# Patient Record
Sex: Male | Born: 1965
Health system: Southern US, Community
[De-identification: ages and names within clinical notes are randomized; demographics above are authoritative.]

## PROBLEM LIST (undated history)

## (undated) DIAGNOSIS — I10 Essential (primary) hypertension: Secondary | ICD-10-CM

## (undated) DIAGNOSIS — E215 Disorder of parathyroid gland, unspecified: Secondary | ICD-10-CM

## (undated) DIAGNOSIS — Z992 Dependence on renal dialysis: Secondary | ICD-10-CM

## (undated) DIAGNOSIS — I770 Arteriovenous fistula, acquired: Secondary | ICD-10-CM

## (undated) DIAGNOSIS — E079 Disorder of thyroid, unspecified: Secondary | ICD-10-CM

## (undated) DIAGNOSIS — D649 Anemia, unspecified: Secondary | ICD-10-CM

## (undated) DIAGNOSIS — N186 End stage renal disease: Secondary | ICD-10-CM

## (undated) DIAGNOSIS — I509 Heart failure, unspecified: Secondary | ICD-10-CM

## (undated) DIAGNOSIS — I517 Cardiomegaly: Secondary | ICD-10-CM

## (undated) DIAGNOSIS — C801 Malignant (primary) neoplasm, unspecified: Secondary | ICD-10-CM

## (undated) DIAGNOSIS — N189 Chronic kidney disease, unspecified: Secondary | ICD-10-CM

## (undated) HISTORY — DX: Chronic kidney disease, unspecified: N18.9

## (undated) HISTORY — PX: OTHER SURGICAL HISTORY: SHX169

---

## 2008-12-22 ENCOUNTER — Emergency Department (HOSPITAL_COMMUNITY): Admission: EM | Admit: 2008-12-22 | Discharge: 2008-12-22 | Payer: Self-pay | Admitting: Emergency Medicine

## 2013-04-12 ENCOUNTER — Encounter (HOSPITAL_COMMUNITY): Payer: Self-pay

## 2013-04-12 ENCOUNTER — Emergency Department (HOSPITAL_COMMUNITY): Payer: Self-pay

## 2013-04-12 ENCOUNTER — Inpatient Hospital Stay (HOSPITAL_COMMUNITY)
Admission: EM | Admit: 2013-04-12 | Discharge: 2013-04-13 | DRG: 305 | Discharge status: Against Medical Advice | Payer: Self-pay | Attending: Internal Medicine | Admitting: Internal Medicine

## 2013-04-12 DIAGNOSIS — R609 Edema, unspecified: Secondary | ICD-10-CM | POA: Diagnosis present

## 2013-04-12 DIAGNOSIS — Z91199 Patient's noncompliance with other medical treatment and regimen due to unspecified reason: Secondary | ICD-10-CM

## 2013-04-12 DIAGNOSIS — I5043 Acute on chronic combined systolic (congestive) and diastolic (congestive) heart failure: Secondary | ICD-10-CM | POA: Diagnosis present

## 2013-04-12 DIAGNOSIS — I161 Hypertensive emergency: Secondary | ICD-10-CM | POA: Diagnosis present

## 2013-04-12 DIAGNOSIS — I509 Heart failure, unspecified: Secondary | ICD-10-CM | POA: Diagnosis present

## 2013-04-12 DIAGNOSIS — D649 Anemia, unspecified: Secondary | ICD-10-CM

## 2013-04-12 DIAGNOSIS — Z9119 Patient's noncompliance with other medical treatment and regimen: Secondary | ICD-10-CM

## 2013-04-12 DIAGNOSIS — I5031 Acute diastolic (congestive) heart failure: Secondary | ICD-10-CM

## 2013-04-12 DIAGNOSIS — N179 Acute kidney failure, unspecified: Secondary | ICD-10-CM | POA: Diagnosis present

## 2013-04-12 DIAGNOSIS — I10 Essential (primary) hypertension: Principal | ICD-10-CM | POA: Diagnosis present

## 2013-04-12 DIAGNOSIS — Z6841 Body Mass Index (BMI) 40.0 and over, adult: Secondary | ICD-10-CM

## 2013-04-12 DIAGNOSIS — R0601 Orthopnea: Secondary | ICD-10-CM | POA: Diagnosis present

## 2013-04-12 DIAGNOSIS — R600 Localized edema: Secondary | ICD-10-CM | POA: Diagnosis present

## 2013-04-12 DIAGNOSIS — I517 Cardiomegaly: Secondary | ICD-10-CM | POA: Diagnosis present

## 2013-04-12 DIAGNOSIS — M7989 Other specified soft tissue disorders: Secondary | ICD-10-CM | POA: Diagnosis present

## 2013-04-12 DIAGNOSIS — R748 Abnormal levels of other serum enzymes: Secondary | ICD-10-CM | POA: Diagnosis present

## 2013-04-12 DIAGNOSIS — N19 Unspecified kidney failure: Secondary | ICD-10-CM

## 2013-04-12 HISTORY — DX: Morbid (severe) obesity due to excess calories: E66.01

## 2013-04-12 HISTORY — DX: Cardiomegaly: I51.7

## 2013-04-12 HISTORY — DX: Essential (primary) hypertension: I10

## 2013-04-12 LAB — CBC
MCH: 29.3 pg (ref 26.0–34.0)
MCHC: 33.8 g/dL (ref 30.0–36.0)
MCV: 86.7 fL (ref 78.0–100.0)
Platelets: 156 10*3/uL (ref 150–400)
RDW: 14.7 % (ref 11.5–15.5)
WBC: 5.8 10*3/uL (ref 4.0–10.5)

## 2013-04-12 LAB — BASIC METABOLIC PANEL
BUN: 50 mg/dL — ABNORMAL HIGH (ref 6–23)
Calcium: 8.3 mg/dL — ABNORMAL LOW (ref 8.4–10.5)
Chloride: 103 mEq/L (ref 96–112)
Creatinine, Ser: 4.29 mg/dL — ABNORMAL HIGH (ref 0.50–1.35)
GFR calc Af Amer: 17 mL/min — ABNORMAL LOW (ref >90)
GFR calc non Af Amer: 15 mL/min — ABNORMAL LOW (ref >90)

## 2013-04-12 MED ORDER — NITROGLYCERIN 0.4 MG SL SUBL
0.4000 mg | SUBLINGUAL_TABLET | Freq: Once | SUBLINGUAL | Status: AC
  Administered 2013-04-12: 0.4 mg via SUBLINGUAL
  Filled 2013-04-12: qty 25

## 2013-04-12 MED ORDER — LABETALOL HCL 5 MG/ML IV SOLN
10.0000 mg | Freq: Once | INTRAVENOUS | Status: AC
  Administered 2013-04-12: 10 mg via INTRAVENOUS
  Filled 2013-04-12: qty 4

## 2013-04-12 NOTE — ED Notes (Signed)
Pt complains of high blood pressure, bilateral lower leg swelling. He had an abnormal EKG last week at Urgent Care

## 2013-04-13 ENCOUNTER — Inpatient Hospital Stay (HOSPITAL_COMMUNITY): Payer: Self-pay

## 2013-04-13 ENCOUNTER — Encounter (HOSPITAL_COMMUNITY): Payer: Self-pay

## 2013-04-13 DIAGNOSIS — I517 Cardiomegaly: Secondary | ICD-10-CM | POA: Diagnosis present

## 2013-04-13 DIAGNOSIS — R600 Localized edema: Secondary | ICD-10-CM | POA: Diagnosis present

## 2013-04-13 DIAGNOSIS — N179 Acute kidney failure, unspecified: Secondary | ICD-10-CM

## 2013-04-13 DIAGNOSIS — D649 Anemia, unspecified: Secondary | ICD-10-CM

## 2013-04-13 DIAGNOSIS — I161 Hypertensive emergency: Secondary | ICD-10-CM | POA: Diagnosis present

## 2013-04-13 DIAGNOSIS — I10 Essential (primary) hypertension: Principal | ICD-10-CM

## 2013-04-13 DIAGNOSIS — I059 Rheumatic mitral valve disease, unspecified: Secondary | ICD-10-CM

## 2013-04-13 DIAGNOSIS — I5031 Acute diastolic (congestive) heart failure: Secondary | ICD-10-CM

## 2013-04-13 DIAGNOSIS — R748 Abnormal levels of other serum enzymes: Secondary | ICD-10-CM

## 2013-04-13 DIAGNOSIS — I5043 Acute on chronic combined systolic (congestive) and diastolic (congestive) heart failure: Secondary | ICD-10-CM | POA: Diagnosis present

## 2013-04-13 DIAGNOSIS — I509 Heart failure, unspecified: Secondary | ICD-10-CM

## 2013-04-13 HISTORY — DX: Acute on chronic combined systolic (congestive) and diastolic (congestive) heart failure: I50.43

## 2013-04-13 LAB — MRSA PCR SCREENING: MRSA by PCR: NEGATIVE

## 2013-04-13 LAB — TROPONIN I: Troponin I: 0.38 ng/mL (ref <0.30)

## 2013-04-13 LAB — CBC
Hemoglobin: 11.8 g/dL — ABNORMAL LOW (ref 13.0–17.0)
MCHC: 33.1 g/dL (ref 30.0–36.0)
RBC: 4.11 MIL/uL — ABNORMAL LOW (ref 4.22–5.81)

## 2013-04-13 LAB — COMPREHENSIVE METABOLIC PANEL
BUN: 51 mg/dL — ABNORMAL HIGH (ref 6–23)
CO2: 28 mEq/L (ref 19–32)
Calcium: 8.2 mg/dL — ABNORMAL LOW (ref 8.4–10.5)
Creatinine, Ser: 4.28 mg/dL — ABNORMAL HIGH (ref 0.50–1.35)
GFR calc Af Amer: 17 mL/min — ABNORMAL LOW (ref >90)
GFR calc non Af Amer: 15 mL/min — ABNORMAL LOW (ref >90)
Glucose, Bld: 128 mg/dL — ABNORMAL HIGH (ref 70–99)

## 2013-04-13 LAB — URINALYSIS, ROUTINE W REFLEX MICROSCOPIC
Ketones, ur: NEGATIVE mg/dL
Leukocytes, UA: NEGATIVE
Nitrite: NEGATIVE
Protein, ur: 30 mg/dL — AB
Urobilinogen, UA: 0.2 mg/dL (ref 0.0–1.0)
pH: 5 (ref 5.0–8.0)

## 2013-04-13 LAB — PROTIME-INR
INR: 1.25 (ref 0.00–1.49)
Prothrombin Time: 15.4 seconds — ABNORMAL HIGH (ref 11.6–15.2)

## 2013-04-13 MED ORDER — FUROSEMIDE 10 MG/ML IJ SOLN
40.0000 mg | Freq: Two times a day (BID) | INTRAMUSCULAR | Status: DC
  Administered 2013-04-13: 40 mg via INTRAVENOUS

## 2013-04-13 MED ORDER — ACETAMINOPHEN 650 MG RE SUPP: 650.0000 mg | Freq: Four times a day (QID) | RECTAL | Status: DC | PRN

## 2013-04-13 MED ORDER — ALBUTEROL SULFATE HFA 108 (90 BASE) MCG/ACT IN AERS: 2.0000 | INHALATION_SPRAY | Freq: Four times a day (QID) | RESPIRATORY_TRACT | Status: DC | PRN

## 2013-04-13 MED ORDER — HEPARIN BOLUS VIA INFUSION
4000.0000 [IU] | Freq: Once | INTRAVENOUS | Status: AC
  Administered 2013-04-13: 4000 [IU] via INTRAVENOUS
  Filled 2013-04-13: qty 4000

## 2013-04-13 MED ORDER — SODIUM CHLORIDE 0.9 % IJ SOLN: 3.0000 mL | Freq: Two times a day (BID) | INTRAMUSCULAR | Status: DC

## 2013-04-13 MED ORDER — SODIUM CHLORIDE 0.9 % IV SOLN: 250.0000 mL | INTRAVENOUS | Status: DC | PRN

## 2013-04-13 MED ORDER — LORAZEPAM 2 MG/ML IJ SOLN
0.5000 mg | Freq: Two times a day (BID) | INTRAMUSCULAR | Status: DC | PRN
  Filled 2013-04-13: qty 1

## 2013-04-13 MED ORDER — FUROSEMIDE 10 MG/ML IJ SOLN
40.0000 mg | Freq: Two times a day (BID) | INTRAMUSCULAR | Status: DC
  Filled 2013-04-13: qty 4

## 2013-04-13 MED ORDER — HEPARIN (PORCINE) IN NACL 100-0.45 UNIT/ML-% IJ SOLN
1500.0000 [IU]/h | INTRAMUSCULAR | Status: DC
  Administered 2013-04-13: 1500 [IU]/h via INTRAVENOUS
  Filled 2013-04-13 (×2): qty 250

## 2013-04-13 MED ORDER — ASPIRIN EC 81 MG PO TBEC
81.0000 mg | DELAYED_RELEASE_TABLET | Freq: Every day | ORAL | Status: DC
  Administered 2013-04-13: 81 mg via ORAL
  Filled 2013-04-13: qty 1

## 2013-04-13 MED ORDER — NITROGLYCERIN IN D5W 200-5 MCG/ML-% IV SOLN
2.0000 ug/min | INTRAVENOUS | Status: DC
  Administered 2013-04-13: 20 ug/min via INTRAVENOUS
  Administered 2013-04-13: 170 ug/min via INTRAVENOUS
  Filled 2013-04-13 (×3): qty 250

## 2013-04-13 MED ORDER — ASPIRIN 325 MG PO TABS
325.0000 mg | ORAL_TABLET | Freq: Once | ORAL | Status: AC
  Administered 2013-04-13: 325 mg via ORAL
  Filled 2013-04-13: qty 1

## 2013-04-13 MED ORDER — SODIUM CHLORIDE 0.9 % IJ SOLN: 3.0000 mL | INTRAMUSCULAR | Status: DC | PRN

## 2013-04-13 MED ORDER — MORPHINE SULFATE 2 MG/ML IJ SOLN
1.0000 mg | INTRAMUSCULAR | Status: DC | PRN
  Administered 2013-04-13: 1 mg via INTRAVENOUS
  Filled 2013-04-13: qty 1

## 2013-04-13 MED ORDER — FUROSEMIDE 10 MG/ML IJ SOLN
60.0000 mg | Freq: Once | INTRAMUSCULAR | Status: AC
  Administered 2013-04-13: 60 mg via INTRAVENOUS

## 2013-04-13 MED ORDER — NICARDIPINE HCL IN NACL 20-0.86 MG/200ML-% IV SOLN
5.0000 mg/h | Freq: Once | INTRAVENOUS | Status: AC
  Administered 2013-04-13: 5 mg/h via INTRAVENOUS
  Filled 2013-04-13: qty 200

## 2013-04-13 MED ORDER — HEPARIN SODIUM (PORCINE) 5000 UNIT/ML IJ SOLN
5000.0000 [IU] | Freq: Three times a day (TID) | INTRAMUSCULAR | Status: DC
  Administered 2013-04-13: 5000 [IU] via SUBCUTANEOUS
  Filled 2013-04-13 (×4): qty 1

## 2013-04-13 MED ORDER — METOPROLOL TARTRATE 50 MG PO TABS
50.0000 mg | ORAL_TABLET | Freq: Two times a day (BID) | ORAL | Status: DC
  Administered 2013-04-13: 50 mg via ORAL
  Filled 2013-04-13 (×2): qty 1

## 2013-04-13 MED ORDER — LABETALOL HCL 5 MG/ML IV SOLN
5.0000 mg | Freq: Once | INTRAVENOUS | Status: AC
  Administered 2013-04-13: 5 mg via INTRAVENOUS
  Filled 2013-04-13: qty 4

## 2013-04-13 MED ORDER — ACETAMINOPHEN 325 MG PO TABS: 650.0000 mg | ORAL_TABLET | Freq: Four times a day (QID) | ORAL | Status: DC | PRN

## 2013-04-13 NOTE — Progress Notes (Signed)
FYI - Received word from nursing that pt left AMA before we could evaluate him. Laquetta Racey PA-C

## 2013-04-13 NOTE — Progress Notes (Signed)
Patient seen and examined. Admitted earlier this morning for LE edema and found to have HTN Emergency with BP of 220/140. Also is having orthopnea, CP and has elevated troponins. Is currently on a heparin drip and a nitro drip. Cardiology has been consulted. Will keep in SDU today. He has been non-compliant with his medications (has not taken anything in over 8 months). Will continue to follow.  Domingo Mend, MD [facility] Pager: [phone]

## 2013-04-13 NOTE — ED Notes (Signed)
Dr Venora Maples notified of troponin 0.38

## 2013-04-13 NOTE — H&P (Signed)
[facility] History and Physical  Patient: Devin Bullock  [id number]  DOB: 1965/12/31  DOA: 04/12/2013  Referring physician: Dr Venora Maples PCP: No primary provider on file.  Chief Complaint: pedal edema  HPI: Devin Bullock is a 47 y.o. male with Past medical history of hypertension and obesity. He presented today with pedal edema that has  Review of Systems: as mentioned in the history of present illness.  A Comprehensive review of the other systems is negative.  Past Medical History  Diagnosis Date  . Enlarged heart    History reviewed. No pertinent past surgical history. Social History:  reports that he has never smoked. He does not have any smokeless tobacco history on file. He reports that he does not drink alcohol. His drug history is not on file. Patient is coming from home. Independent for most of his  ADL.  No Known Allergies  History reviewed. No pertinent family history.  Prior to Admission medications   Medication Sig Start Date End Date Taking? Authorizing Provider  albuterol (PROVENTIL HFA;VENTOLIN HFA) 108 (90 BASE) MCG/ACT inhaler Inhale 2 puffs into the lungs every 6 (six) hours as needed for wheezing.   Yes Historical Provider, MD  HYDROcodone-homatropine (HYCODAN) 5-1.5 MG/5ML syrup Take 5 mLs by mouth every 6 (six) hours as needed for cough.   Yes Historical Provider, MD    Physical Exam: Filed Vitals:   04/13/13 0430 04/13/13 0445 04/13/13 0500 04/13/13 0515  BP: 223/140 226/142 228/145 204/122  Pulse: 98 94    Temp:      TempSrc:      Resp: 24 22 35 23  Height:      Weight:      SpO2: 97% 97% 96% 95%    General: Alert, Awake and Oriented to Time, Place and Person. Appear in moderate distress Eyes: PERRL ENT: Oral Mucosa clear moist. Neck: No JVD, no Carotid Bruits  Cardiovascular: S1 and S2 Present, no Murmur, Peripheral Pulses Present Respiratory: Bilateral Air entry equal and Decreased, minimal basal Crackles, no wheezes Abdomen: Bowel Sound  Present, Soft and Non tender Skin: No Rash Extremities: +2 Pedal edema, no calf tenderness Neurologic: Grossly Unremarkable.  Labs on Admission:  CBC:  Recent Labs Lab 04/12/13 2310 04/13/13 0525  WBC 5.8 5.0  HGB 13.0 11.8*  HCT 38.5* 35.6*  MCV 86.7 86.6  PLT 156 138*    CMP     Component Value Date/Time   NA 138 04/12/2013 2310   K 3.8 04/12/2013 2310   CL 103 04/12/2013 2310   CO2 26 04/12/2013 2310   GLUCOSE 171* 04/12/2013 2310   BUN 50* 04/12/2013 2310   CREATININE 4.29* 04/12/2013 2310   CALCIUM 8.3* 04/12/2013 2310   GFRNONAA 15* 04/12/2013 2310   GFRAA 17* 04/12/2013 2310    No results found for this basename: LIPASE, AMYLASE,  in the last 168 hours No results found for this basename: AMMONIA,  in the last 168 hours  Cardiac Enzymes:  Recent Labs Lab 04/12/13 2310  TROPONINI 0.38*    BNP (last 3 results)  Recent Labs  04/12/13 2310  PROBNP 5417.0*    Radiological Exams on Admission: Dg Chest 2 View  04/12/2013   *RADIOLOGY REPORT*  Clinical Data: Shortness of breath for 24 hours  CHEST - 2 VIEW  Comparison: 12/22/2008  Findings:  Interval increase in the size of the cardiac silhouette.  There is pulmonary venous congestion without frank evidence of edema. Minimal thickening along the right minor fissure.  No pleural effusion  or pneumothorax.  Minimal perihilar atelectasis.  No acute osseous abnormality.  IMPRESSION: 1.  Interval increase in the size of the cardiac silhouette since prior examination performed in 2010.  While possibly representative of cardiomegaly, a pericardial effusion may have a similar appearance.  Further evaluation with cardiac echo may be performed as clinically indicated. 2.  Pulmonary venous congestion without frank evidence of edema.   Original Report Authenticated By: Jake Seats, MD    EKG: Independently reviewed. nonspecific ST and T waves changes. No acute ischemia.  Assessment/Plan Principal Problem:   Malignant  hypertension Active Problems:   Pedal edema   Cardiomegaly   Acute HF (heart failure)   AKI (acute kidney injury)   1. Malignant hypertension Patient has elevated blood pressure, with diastolic more than [id number], without any neurological complain, chest pain or shortness of breath. On further questioning he has leg swelling which was a presenting complaint and orthopnea at his baseline. Currently again he denies any chest pain his EKG is negative he has mildly elevated troponin levels, along with that he has acute kidney injury as well with serum creatinine of 4. Patient has been given IV nicardipine and labetalol. Currently his chest x-ray is suggestive of pulmonary venous congestion. ER physician has performed a bedside ultrasound which does not reveal large pericardial effusion. With this findings patient should be treated with IV antihypertensive. The choice of agent is nitroglycerin which also help in reducing the preload for his venous condition and is easily titratable, also does not have any significant adverse effect on cardiac contractility. Mildly elevated troponin is more likely secondary to demand ischemia and AKI, rather than true ischemia as the patient does not have any complaint of chest pain or anginal-like symptoms and EKG does not show any ST-T wave changes suggestive of ischemia. We will follow serial troponins, if there is further elevation than patient should be anticoagulated. One dose of IV Lasix will be given for diuresis, the patient does not benefit repeated doses will be given.  2. Cardiomegaly Patient has been told in the past that he had an enlarged heart, was recommended to see cardio one year ago. Currently he continues to have cardiomegaly, with questionable small pericardial effusion.  I will obtain an echocardiogram in the morning for further workup.  3. Acute kidney injury Unclear whether it is acute onset or chronic kidney disease newly identified. I will  get an ultrasound of the kidney for further workup. Patient does not have any obstructive symptoms or dehydration. Possibility of cardiorenal hemodynamics as a cause cannot be ruled out.   DVT Prophylaxis: subcutaneous Heparin Nutrition: Cardiac and renal diet  Code Status: Full   Author: Berle Mull, MD [facility] Pager: [phone] 04/13/2013, 5:39 AM    If 7PM-7AM, please contact night-coverage [email or web address] Password [email or web address]

## 2013-04-13 NOTE — Progress Notes (Signed)
ANTICOAGULATION CONSULT NOTE - Initial Consult  Pharmacy Consult for IV Heparin Indication: chest pain/ACS  No Known Allergies  Patient Measurements: Height: 6\' 4"  (193 cm) Weight: 337 lb 15.4 oz (153.3 kg) IBW/kg (Calculated) : 86.8 Heparin Dosing Weight: 106 kg  Vital Signs: Temp: 97.6 F (36.4 C) (08/29 0400) Temp src: Oral (08/29 0400) BP: 169/112 mmHg (08/29 0700) Pulse Rate: 94 (08/29 0445)  Labs:  Recent Labs  04/12/13 2310 04/13/13 0525  HGB 13.0 11.8*  HCT 38.5* 35.6*  PLT 156 138*  APTT  --  34  LABPROT  --  15.4*  INR  --  1.25  CREATININE 4.29* 4.28*  TROPONINI 0.38* 0.42*    Estimated Creatinine Clearance: 33.5 ml/min (by C-G formula based on Cr of 4.28).   Medical History: Past Medical History  Diagnosis Date  . Enlarged heart     Medications:  Scheduled:  . aspirin EC  81 mg Oral Daily  . sodium chloride  3 mL Intravenous Q12H  . sodium chloride  3 mL Intravenous Q12H   Infusions:  . heparin 1,500 Units/hr (04/13/13 0700)  . nitroGLYCERIN 166.667 mcg/min (04/13/13 0600)    Assessment: 47 yo with history of HTN and obesity admitted with pedal edema. Now with elevated troponin. Goal of Therapy:  Heparin level 0.3-0.7 units/ml Monitor platelets by anticoagulation protocol: Yes   Plan:   Heparin 4000 unit bolus IV x1 (RN gave 5000 sq 0545)  Start drip @ 1500 units/hr  Daily CBC/HL  Check 1st HL @ 1300 today  Dorrene German 04/13/2013,7:22 AM

## 2013-04-13 NOTE — ED Provider Notes (Signed)
CSN: [id number]     Arrival date & time 04/12/13  2208 History   First MD Initiated Contact with Patient 04/12/13 2309     Chief Complaint  Patient presents with  . Hypertension    HPI Patient reports increasing lower extremity swelling over the past 2 days as well some increasing orthopnea.  He denies chest pain.  He's been told before that he has a "enlarged heart".  He's not on any antihypertensive medications.  He does not see a Dr. regularly.  He reports that elevation of his legs does not seem to improve the swelling.  No history of DVT or pulmonary embolism.  He denies tobacco abuse.  He reports he was seen at urgent care several days ago was told at that time that he was hypertensive and was told that he would need to see a cardiologist for followup but he was never given the name and he has not called any cardiologist.  He presents today because of worsening lower extremity edema and concern about his blood pressure.  No shortness of breath at rest.  He's been sleeping in a recliner over the past several days Past Medical History  Diagnosis Date  . Enlarged heart    History reviewed. No pertinent past surgical history. History reviewed. No pertinent family history. History  Substance Use Topics  . Smoking status: Never Smoker   . Smokeless tobacco: Not on file  . Alcohol Use: No    Review of Systems  All other systems reviewed and are negative.    Allergies  Review of patient's allergies indicates no known allergies.  Home Medications   Current Outpatient Rx  Name  Route  Sig  Dispense  Refill  . albuterol (PROVENTIL HFA;VENTOLIN HFA) 108 (90 BASE) MCG/ACT inhaler   Inhalation   Inhale 2 puffs into the lungs every 6 (six) hours as needed for wheezing.         Marland Kitchen HYDROcodone-homatropine (HYCODAN) 5-1.5 MG/5ML syrup   Oral   Take 5 mLs by mouth every 6 (six) hours as needed for cough.          BP 164/107  Pulse 93  Temp(Src) 98.2 F (36.8 C) (Oral)  Resp 26   SpO2 90% Physical Exam  Nursing note and vitals reviewed. Constitutional: He is oriented to person, place, and time. He appears well-developed and well-nourished.  Morbidly obese  HENT:  Head: Normocephalic and atraumatic.  Eyes: EOM are normal.  Neck: Normal range of motion.  Cardiovascular: Normal rate, regular rhythm, normal heart sounds and intact distal pulses.   Pulmonary/Chest: Effort normal and breath sounds normal. No respiratory distress.  Abdominal: Soft. He exhibits no distension. There is no tenderness. There is no rebound and no guarding.  Musculoskeletal: Normal range of motion.  3+ pitting edema bilaterally  Neurological: He is alert and oriented to person, place, and time.  Skin: Skin is warm and dry.  Psychiatric: He has a normal mood and affect. Judgment normal.    ED Course  Procedures (including critical care time) CRITICAL CARE Performed by: Hoy Morn Total critical care time: 32 Critical care time was exclusive of separately billable procedures and treating other patients. Critical care was necessary to treat or prevent imminent or life-threatening deterioration. Critical care was time spent personally by me on the following activities: development of treatment plan with patient and/or surrogate as well as nursing, discussions with consultants, evaluation of patient's response to treatment, examination of patient, obtaining history from patient or surrogate,  ordering and performing treatments and interventions, ordering and review of laboratory studies, ordering and review of radiographic studies, pulse oximetry and re-evaluation of patient's condition.   Date: 04/13/2013  Rate: 91  Rhythm: normal sinus rhythm  QRS Axis: normal  Intervals: normal  ST/T Wave abnormalities: Nonspecific ST and T wave changes consistent with repolarization abnormality  Conduction Disutrbances: none  Narrative Interpretation: LVH with repolarization abnormalities   Old EKG  Reviewed: No significant changes noted     Labs Review Labs Reviewed  CBC - Abnormal; Notable for the following:    HCT 38.5 (*)    All other components within normal limits  BASIC METABOLIC PANEL - Abnormal; Notable for the following:    Glucose, Bld 171 (*)    BUN 50 (*)    Creatinine, Ser 4.29 (*)    Calcium 8.3 (*)    GFR calc non Af Amer 15 (*)    GFR calc Af Amer 17 (*)    All other components within normal limits  TROPONIN I - Abnormal; Notable for the following:    Troponin I 0.38 (*)    All other components within normal limits  PRO B NATRIURETIC PEPTIDE - Abnormal; Notable for the following:    Pro B Natriuretic peptide (BNP) 5417.0 (*)    All other components within normal limits   Imaging Review Dg Chest 2 View  04/12/2013   *RADIOLOGY REPORT*  Clinical Data: Shortness of breath for 24 hours  CHEST - 2 VIEW  Comparison: 12/22/2008  Findings:  Interval increase in the size of the cardiac silhouette.  There is pulmonary venous congestion without frank evidence of edema. Minimal thickening along the right minor fissure.  No pleural effusion or pneumothorax.  Minimal perihilar atelectasis.  No acute osseous abnormality.  IMPRESSION: 1.  Interval increase in the size of the cardiac silhouette since prior examination performed in 2010.  While possibly representative of cardiomegaly, a pericardial effusion may have a similar appearance.  Further evaluation with cardiac echo may be performed as clinically indicated. 2.  Pulmonary venous congestion without frank evidence of edema.   Original Report Authenticated By: Jake Seats, MD   I personally reviewed the imaging tests through PACS system I reviewed available ER/hospitalization records through the EMR  MDM   1. CHF (congestive heart failure)   2. Cardiac enzymes elevated   3. Renal failure   4. Malignant hypertension   5. Hypertensive emergency    Patient presents with orthopnea and dyspnea on exertion as well as  increasing lower extremity edema.  He was found to be profoundly hypertensive on arrival with blood pressure 220/137.  Patient was initially given IV labetalol without significant improvement in his blood pressure.  He was started on a nicardipine drip.  Patient has evidence of cardiac strain with evidence of LVH and elevated troponin here in the emergency department.  He also has evidence of renal dysfunction likely secondary to long-standing poorly controlled hypertension.  He is on no antihypertensives at home.  Lower extremity edema likely secondary to right heart failure.   Hoy Morn, MD 04/13/13 [phone]

## 2013-04-13 NOTE — Progress Notes (Addendum)
Patient left AMA;  Pt counseled by bedside RN, Berlin Hun, charge nurse, Pam and by Dr. Jerilee Hoh regarding the potential dangers of discontinuing care at this time.  Pt refused to stay and dressed himself.  Pt's IV meds were discontinued, his PIV was removed and pt was accompanied to the main entrance where he left under his own power.

## 2013-04-13 NOTE — Progress Notes (Signed)
Echocardiogram 2D Echocardiogram has been performed.  Devin Bullock 04/13/2013, 4:25 PM

## 2013-04-13 NOTE — Progress Notes (Signed)
Dr. Jerilee Hoh notified pt very anxious, B/P climbing, 190's /130's, Nitro gtt at 170 mcg/min.  Metoprolol, lasix and ativan ordered.  Pt refused medications initially stating, " I don't want anymore drugs."   Pt agreed to take metoprolol and lasix.  RN continuing to monitor.

## 2013-04-14 ENCOUNTER — Encounter (HOSPITAL_COMMUNITY): Payer: Self-pay | Admitting: Emergency Medicine

## 2013-04-14 ENCOUNTER — Inpatient Hospital Stay (HOSPITAL_COMMUNITY)
Admission: EM | Admit: 2013-04-14 | Discharge: 2013-04-17 | DRG: 292 | Disposition: A | Discharge status: Home / Self Care | Payer: Self-pay | Attending: Internal Medicine | Admitting: Internal Medicine

## 2013-04-14 ENCOUNTER — Emergency Department (HOSPITAL_COMMUNITY): Payer: Self-pay

## 2013-04-14 DIAGNOSIS — Z823 Family history of stroke: Secondary | ICD-10-CM

## 2013-04-14 DIAGNOSIS — D649 Anemia, unspecified: Secondary | ICD-10-CM | POA: Diagnosis present

## 2013-04-14 DIAGNOSIS — Z91199 Patient's noncompliance with other medical treatment and regimen due to unspecified reason: Secondary | ICD-10-CM

## 2013-04-14 DIAGNOSIS — I10 Essential (primary) hypertension: Secondary | ICD-10-CM

## 2013-04-14 DIAGNOSIS — J811 Chronic pulmonary edema: Secondary | ICD-10-CM

## 2013-04-14 DIAGNOSIS — N185 Chronic kidney disease, stage 5: Secondary | ICD-10-CM | POA: Diagnosis present

## 2013-04-14 DIAGNOSIS — D631 Anemia in chronic kidney disease: Secondary | ICD-10-CM | POA: Diagnosis present

## 2013-04-14 DIAGNOSIS — N179 Acute kidney failure, unspecified: Secondary | ICD-10-CM | POA: Diagnosis present

## 2013-04-14 DIAGNOSIS — I517 Cardiomegaly: Secondary | ICD-10-CM

## 2013-04-14 DIAGNOSIS — E66812 Obesity, class 2: Secondary | ICD-10-CM | POA: Diagnosis present

## 2013-04-14 DIAGNOSIS — D638 Anemia in other chronic diseases classified elsewhere: Secondary | ICD-10-CM | POA: Diagnosis present

## 2013-04-14 DIAGNOSIS — I161 Hypertensive emergency: Secondary | ICD-10-CM | POA: Diagnosis present

## 2013-04-14 DIAGNOSIS — E876 Hypokalemia: Secondary | ICD-10-CM | POA: Diagnosis present

## 2013-04-14 DIAGNOSIS — I319 Disease of pericardium, unspecified: Secondary | ICD-10-CM | POA: Diagnosis present

## 2013-04-14 DIAGNOSIS — I5031 Acute diastolic (congestive) heart failure: Secondary | ICD-10-CM

## 2013-04-14 DIAGNOSIS — I509 Heart failure, unspecified: Secondary | ICD-10-CM | POA: Diagnosis present

## 2013-04-14 DIAGNOSIS — R600 Localized edema: Secondary | ICD-10-CM

## 2013-04-14 DIAGNOSIS — Z9119 Patient's noncompliance with other medical treatment and regimen: Secondary | ICD-10-CM

## 2013-04-14 DIAGNOSIS — Z8249 Family history of ischemic heart disease and other diseases of the circulatory system: Secondary | ICD-10-CM

## 2013-04-14 DIAGNOSIS — R0602 Shortness of breath: Secondary | ICD-10-CM | POA: Diagnosis present

## 2013-04-14 DIAGNOSIS — I214 Non-ST elevation (NSTEMI) myocardial infarction: Secondary | ICD-10-CM

## 2013-04-14 DIAGNOSIS — L608 Other nail disorders: Secondary | ICD-10-CM | POA: Diagnosis present

## 2013-04-14 DIAGNOSIS — I5043 Acute on chronic combined systolic (congestive) and diastolic (congestive) heart failure: Principal | ICD-10-CM | POA: Diagnosis present

## 2013-04-14 DIAGNOSIS — I120 Hypertensive chronic kidney disease with stage 5 chronic kidney disease or end stage renal disease: Secondary | ICD-10-CM | POA: Diagnosis present

## 2013-04-14 LAB — BASIC METABOLIC PANEL
BUN: 48 mg/dL — ABNORMAL HIGH (ref 6–23)
CO2: 26 mEq/L (ref 19–32)
Chloride: 100 mEq/L (ref 96–112)
Creatinine, Ser: 4.26 mg/dL — ABNORMAL HIGH (ref 0.50–1.35)

## 2013-04-14 LAB — CBC
HCT: 37.1 % — ABNORMAL LOW (ref 39.0–52.0)
Hemoglobin: 12.8 g/dL — ABNORMAL LOW (ref 13.0–17.0)
MCH: 29.6 pg (ref 26.0–34.0)
MCV: 85.9 fL (ref 78.0–100.0)
RBC: 4.32 MIL/uL (ref 4.22–5.81)

## 2013-04-14 MED ORDER — LISINOPRIL 10 MG PO TABS: 10.0000 mg | ORAL_TABLET | Freq: Every day | ORAL | Status: DC

## 2013-04-14 MED ORDER — POTASSIUM CHLORIDE CRYS ER 20 MEQ PO TBCR
20.0000 meq | EXTENDED_RELEASE_TABLET | Freq: Two times a day (BID) | ORAL | Status: DC
  Administered 2013-04-15: 20 meq via ORAL
  Filled 2013-04-14: qty 1

## 2013-04-14 MED ORDER — SODIUM CHLORIDE 0.9 % IJ SOLN: 3.0000 mL | INTRAMUSCULAR | Status: DC | PRN

## 2013-04-14 MED ORDER — FUROSEMIDE 10 MG/ML IJ SOLN
40.0000 mg | Freq: Once | INTRAMUSCULAR | Status: AC
  Administered 2013-04-14: 40 mg via INTRAVENOUS
  Filled 2013-04-14: qty 4

## 2013-04-14 MED ORDER — LABETALOL HCL 5 MG/ML IV SOLN
20.0000 mg | Freq: Once | INTRAVENOUS | Status: AC
  Administered 2013-04-14: 20 mg via INTRAVENOUS
  Filled 2013-04-14: qty 4

## 2013-04-14 MED ORDER — FUROSEMIDE 10 MG/ML IJ SOLN
40.0000 mg | Freq: Two times a day (BID) | INTRAMUSCULAR | Status: DC
  Administered 2013-04-15: 40 mg via INTRAVENOUS

## 2013-04-14 MED ORDER — NITROGLYCERIN IN D5W 200-5 MCG/ML-% IV SOLN: 5.0000 ug/min | INTRAVENOUS | Status: DC

## 2013-04-14 MED ORDER — CARVEDILOL 3.125 MG PO TABS
3.1250 mg | ORAL_TABLET | Freq: Two times a day (BID) | ORAL | Status: DC
  Administered 2013-04-15: 3.125 mg via ORAL
  Filled 2013-04-14 (×2): qty 1

## 2013-04-14 MED ORDER — NITROGLYCERIN IN D5W 200-5 MCG/ML-% IV SOLN
2.0000 ug/min | INTRAVENOUS | Status: DC
  Administered 2013-04-15: 40 ug/min via INTRAVENOUS
  Administered 2013-04-15: 30 ug/min via INTRAVENOUS
  Administered 2013-04-15: 10 ug/min via INTRAVENOUS

## 2013-04-14 MED ORDER — ONDANSETRON HCL 4 MG/2ML IJ SOLN: 4.0000 mg | Freq: Four times a day (QID) | INTRAMUSCULAR | Status: DC | PRN

## 2013-04-14 MED ORDER — ASPIRIN 81 MG PO CHEW
324.0000 mg | CHEWABLE_TABLET | Freq: Once | ORAL | Status: AC
  Administered 2013-04-14: 324 mg via ORAL
  Filled 2013-04-14: qty 4

## 2013-04-14 MED ORDER — SODIUM CHLORIDE 0.9 % IV SOLN
250.0000 mL | INTRAVENOUS | Status: DC | PRN
Start: 2013-04-14 — End: 2013-04-17
  Administered 2013-04-15: 10 mL via INTRAVENOUS

## 2013-04-14 MED ORDER — NITROGLYCERIN 0.4 MG SL SUBL
0.4000 mg | SUBLINGUAL_TABLET | SUBLINGUAL | Status: AC
  Administered 2013-04-14 (×2): 0.4 mg via SUBLINGUAL
  Filled 2013-04-14: qty 25

## 2013-04-14 MED ORDER — ACETAMINOPHEN 325 MG PO TABS
650.0000 mg | ORAL_TABLET | ORAL | Status: DC | PRN
  Administered 2013-04-15: 650 mg via ORAL
  Filled 2013-04-14: qty 2

## 2013-04-14 MED ORDER — ENOXAPARIN SODIUM 80 MG/0.8ML ~~LOC~~ SOLN
75.0000 mg | SUBCUTANEOUS | Status: DC
  Administered 2013-04-15: 11:00:00 via SUBCUTANEOUS
  Administered 2013-04-17: 75 mg via SUBCUTANEOUS
  Filled 2013-04-14 (×3): qty 0.8

## 2013-04-14 MED ORDER — SODIUM CHLORIDE 0.9 % IJ SOLN
3.0000 mL | Freq: Two times a day (BID) | INTRAMUSCULAR | Status: DC
  Administered 2013-04-15 – 2013-04-17 (×5): 3 mL via INTRAVENOUS

## 2013-04-14 MED ORDER — ALBUTEROL SULFATE HFA 108 (90 BASE) MCG/ACT IN AERS: 2.0000 | INHALATION_SPRAY | Freq: Four times a day (QID) | RESPIRATORY_TRACT | Status: DC | PRN

## 2013-04-14 NOTE — ED Notes (Signed)
Pt states left [facility] ICU  Yesterday AMA because "i felt like they were not doing what I needed. If I knew that the cardiologists only came to [facility] I would not have gone to [facility]"  Came in today because "I want to finish the work-up" . Pt c/o's shortness of breath, ankle swelling,

## 2013-04-14 NOTE — ED Provider Notes (Signed)
TIME SEEN: 7:28 PM  CHIEF COMPLAINT: Shortness of breath, lower extremity swelling  HPI: Patient is a 47 y.o. African American male with a history of recent diagnosis of hypertension who presents emergency department with several days of shortness of breath worse with lying flat and exertion as well as bilateral lower extremity swelling. He was recently admitted to [facility] for hypertensive emergency and pulmonary edema and left AGAINST MEDICAL ADVICE before receiving any testing. He denies any chest pain today. No fever or cough. No history of PE or DVT.  ROS: See HPI Constitutional: no fever  Eyes: no drainage  ENT: no runny nose   Cardiovascular:  no chest pain  Resp: SOB  GI: no vomiting GU: no dysuria Integumentary: no rash  Allergy: no hives  Musculoskeletal: no leg swelling  Neurological: no slurred speech ROS otherwise negative  PAST MEDICAL HISTORY/PAST SURGICAL HISTORY:  Past Medical History  Diagnosis Date  . Enlarged heart   . Hypertension   . Morbid obesity     MEDICATIONS:  Prior to Admission medications   Medication Sig Start Date End Date Taking? Authorizing Provider  albuterol (PROVENTIL HFA;VENTOLIN HFA) 108 (90 BASE) MCG/ACT inhaler Inhale 2 puffs into the lungs every 6 (six) hours as needed for wheezing.    Historical Provider, MD  HYDROcodone-homatropine (HYCODAN) 5-1.5 MG/5ML syrup Take 5 mLs by mouth every 6 (six) hours as needed for cough.    Historical Provider, MD    ALLERGIES:  No Known Allergies  SOCIAL HISTORY:  History  Substance Use Topics  . Smoking status: Never Smoker   . Smokeless tobacco: Never Used  . Alcohol Use: No    FAMILY HISTORY: No family history on file.  EXAM: BP 195/137  Pulse 109  Temp(Src) 98 F (36.7 C) (Oral)  Resp 18  SpO2 98% CONSTITUTIONAL: Alert and oriented and responds appropriately to questions. Well-appearing; well-nourished, patient is in no distress until he moves around the bed and then  began speaking in short sentences, hypertensive HEAD: Normocephalic EYES: Conjunctivae clear, PERRL ENT: normal nose; no rhinorrhea; moist mucous membranes; pharynx without lesions noted NECK: Supple, no meningismus, no LAD  CARD: RRR; S1 and S2 appreciated; no murmurs, no clicks, no rubs, no gallops RESP: Normal chest excursion without splinting or tachypnea; breath sounds are equal bilaterally but there crackles at his bases bilaterally, patient is not tachypneic until he begins to move around ABD/GI: Normal bowel sounds; non-distended; soft, non-tender, no rebound, no guarding BACK:  The back appears normal and is non-tender to palpation, there is no CVA tenderness EXT: Normal ROM in all joints; non-tender to palpation; 3+ pitting edema to the midcalf bilaterally; normal capillary refill; no cyanosis    SKIN: Normal color for age and race; warm NEURO: Moves all extremities equally PSYCH: The patient's mood and manner are appropriate. Grooming and personal hygiene are appropriate.  MEDICAL DECISION MAKING: Patient is hypertensive with pulmonary edema on exam, peripheral edema, JVD. Suspect hypertensive emergency. We'll give nitroglycerin, IV Lasix. He is in no respiratory distress when sitting upright at rest. Denies any chest pain. His EKG shows T-wave inversions in 1 and aVL which are unchanged, LVH. We'll obtain cardiac labs, BMP, chest x-ray. Patient will need admission to the hospital. He does not have a primary care physician or cardiologist.   Date: 04/14/2013 19:22   Rate: 105   Rhythm: Sinus tachycardia  QRS Axis: normal  Intervals: normal  ST/T Wave abnormalities: normal  Conduction Disutrbances: none  Narrative Interpretation:  Sinus tachycardia, poor R-wave progression, T-wave inversions in 1 and aVL, LVH, no change compared to prior EKG    ED PROGRESS: Patient does have an elevated troponin of 0.51.Marland Kitchen Suspect this is due to his hypertensive emergency. Will give aspirin. He is  not complaining of chest pain.  Blood pressure is improved to 180s/100s after nitroglycerin tablets. We'll give IV labetalol. Discussed with hospitalist who recommends admission to step down and starting a nitroglycerin drip. We'll also discuss with cardiology given patient NSTEMI.  Clawson, DO 04/14/13 2256

## 2013-04-14 NOTE — ED Notes (Signed)
Pt has 2-3+pitting edema from toes to above knees.

## 2013-04-14 NOTE — Consult Note (Signed)
Devin Bullock is an 47 y.o. male.   Chief Complaint: Shortness of breath and leg edema HPI: 47 y.o. African American male with a history of recent diagnosis of hypertension, renal dysfunction and obesity, presents emergency department with several days of shortness of breath worse with lying flat and exertion as well as bilateral lower extremity swelling. He was recently admitted to [facility] for hypertensive emergency and pulmonary edema and left AGAINST MEDICAL ADVICE before receiving any testing. He denies any chest pain today. No fever or cough. No history of PE or DVT.   Past Medical History  Diagnosis Date  . Enlarged heart   . Hypertension   . Morbid obesity       History reviewed. No pertinent past surgical history.  History reviewed. No pertinent family history. Social History:  reports that he has never smoked. He has never used smokeless tobacco. He reports that he does not drink alcohol or use illicit drugs.  Allergies: No Known Allergies   (Not in a hospital admission)  Results for orders placed during the hospital encounter of 04/14/13 (from the past 48 hour(s))  PRO B NATRIURETIC PEPTIDE     Status: Abnormal   Collection Time    04/14/13  8:00 PM      Result Value Range   Pro B Natriuretic peptide (BNP) 6635.0 (*) 0 - 125 pg/mL  CBC     Status: Abnormal   Collection Time    04/14/13  8:15 PM      Result Value Range   WBC 6.5  4.0 - 10.5 K/uL   RBC 4.32  4.22 - 5.81 MIL/uL   Hemoglobin 12.8 (*) 13.0 - 17.0 g/dL   HCT 37.1 (*) 39.0 - 52.0 %   MCV 85.9  78.0 - 100.0 fL   MCH 29.6  26.0 - 34.0 pg   MCHC 34.5  30.0 - 36.0 g/dL   RDW 14.2  11.5 - 15.5 %   Platelets 133 (*) 150 - 400 K/uL  BASIC METABOLIC PANEL     Status: Abnormal   Collection Time    04/14/13  8:15 PM      Result Value Range   Sodium 139  135 - 145 mEq/L   Potassium 3.5  3.5 - 5.1 mEq/L   Chloride 100  96 - 112 mEq/L   CO2 26  19 - 32 mEq/L   Glucose, Bld 124 (*) 70 - 99 mg/dL   BUN  48 (*) 6 - 23 mg/dL   Creatinine, Ser 4.26 (*) 0.50 - 1.35 mg/dL   Calcium 8.4  8.4 - 10.5 mg/dL   GFR calc non Af Amer 15 (*) >90 mL/min   GFR calc Af Amer 17 (*) >90 mL/min   Comment: (NOTE)     The eGFR has been calculated using the CKD EPI equation.     This calculation has not been validated in all clinical situations.     eGFR's persistently <90 mL/min signify possible Chronic Kidney     Disease.  PROTIME-INR     Status: Abnormal   Collection Time    04/14/13  8:15 PM      Result Value Range   Prothrombin Time 15.4 (*) 11.6 - 15.2 seconds   INR 1.25  0.00 - 1.49   Dg Chest 2 View  04/14/2013   CLINICAL DATA:  Shortness of breath. Bilateral lower extremity swelling.  EXAM: CHEST  2 VIEW  COMPARISON:  03/2013  FINDINGS: Cardiomegaly with vascular congestion. No  overt edema. No focal opacities or effusions. No acute bony abnormality.  IMPRESSION: Cardiomegaly, vascular congestion.   Electronically Signed   By: Rolm Baptise   On: 04/14/2013 21:30   Dg Chest 2 View  04/12/2013   *RADIOLOGY REPORT*  Clinical Data: Shortness of breath for 24 hours  CHEST - 2 VIEW  Comparison: 12/22/2008  Findings:  Interval increase in the size of the cardiac silhouette.  There is pulmonary venous congestion without frank evidence of edema. Minimal thickening along the right minor fissure.  No pleural effusion or pneumothorax.  Minimal perihilar atelectasis.  No acute osseous abnormality.  IMPRESSION: 1.  Interval increase in the size of the cardiac silhouette since prior examination performed in 2010.  While possibly representative of cardiomegaly, a pericardial effusion may have a similar appearance.  Further evaluation with cardiac echo may be performed as clinically indicated. 2.  Pulmonary venous congestion without frank evidence of edema.   Original Report Authenticated By: Jake Seats, MD   US Renal  04/13/2013   *RADIOLOGY REPORT*  Clinical Data: Acute renal injury.  RENAL/URINARY TRACT ULTRASOUND  COMPLETE  Comparison:  None.  Findings:  Right Kidney:  9.8 cm in length. Normal renal cortical thickness and echogenicity without focal lesions or hydronephrosis.  Left Kidney:  10.0 cm in length. Normal renal cortical thickness and echogenicity without focal lesions or hydronephrosis.  Bladder:  Normal.  IMPRESSION: Unremarkable renal ultrasound examination.   Original Report Authenticated By: Marijo Sanes, M.D.    @ROS @ Constitutional: no fever + weight gain Eyes: no drainage  ENT: no runny nose  Cardiovascular: no chest pain  Resp: SOB  GI: no vomiting  GU: no dysuria  Integumentary: no rash  Allergy: no hives  Musculoskeletal: no leg swelling  Neurological: no slurred speech   Blood pressure 197/125, pulse 98, temperature 98 F (36.7 C), temperature source Oral, resp. rate 23, height 6\' 4"  (1.93 m), weight 148.78 kg (328 lb), SpO2 97.00%. CONSTITUTIONAL: Well-built; well-nourished, patient in mild distress, hypertensive  HEAD: Normocephalic, atraumatic  EYES: Brown, Conjunctivae clear, PERRL  ENT: normal nose; no rhinorrhea; moist mucous membranes; pharynx without lesions noted  NECK: Supple, no meningismus, + JVD  CARD: RRR; S1 and S2 normal; no murmurs, no clicks, no rubs, no gallops  RESP: Normal chest excursion without splinting or tachypnea; breath sounds are equal bilaterally. Crackles at bases bilaterally, patient is not tachypneic until he begins to move around  ABD/GI: Normal bowel sounds; distended; soft, non-tender, no rebound, no guarding  BACK: The back appears normal and is non-tender to palpation, there is no CVA tenderness  EXT: Normal ROM in all joints; non-tender to palpation; 3+ pitting edema to the midcalf bilaterally; normal capillary refill; no cyanosis  SKIN: Normal color for age and race; warm  NEURO: Cranial nerves grossly intact. Moves all extremities equally  PSYCH: The patient's mood and manner are appropriate. Grooming and personal hygiene are  appropriate   Assessment/Plan Acute left heart systolic failure Hypertension, uncontrolled Morbid Obesity Dilated cardiomyopathy  Agree with IV NTG, lasix, small dose B-blocker.  Kassem Kibbe S 04/14/2013, 11:32 PM

## 2013-04-14 NOTE — ED Notes (Signed)
I-stat Troponin: 0.51

## 2013-04-14 NOTE — ED Notes (Signed)
PT. REPORTS PROGRESSING SOB WITH OCCASIONAL DRY COUGH SEEN AT  2 DAYS AGO WITH THE SAME COMPLAINTS .

## 2013-04-15 ENCOUNTER — Encounter (HOSPITAL_COMMUNITY): Payer: Self-pay | Admitting: Internal Medicine

## 2013-04-15 DIAGNOSIS — D638 Anemia in other chronic diseases classified elsewhere: Secondary | ICD-10-CM | POA: Diagnosis present

## 2013-04-15 DIAGNOSIS — R0602 Shortness of breath: Secondary | ICD-10-CM | POA: Diagnosis present

## 2013-04-15 DIAGNOSIS — I5043 Acute on chronic combined systolic (congestive) and diastolic (congestive) heart failure: Principal | ICD-10-CM

## 2013-04-15 DIAGNOSIS — E871 Hypo-osmolality and hyponatremia: Secondary | ICD-10-CM | POA: Insufficient documentation

## 2013-04-15 DIAGNOSIS — D649 Anemia, unspecified: Secondary | ICD-10-CM | POA: Diagnosis present

## 2013-04-15 DIAGNOSIS — E66812 Obesity, class 2: Secondary | ICD-10-CM | POA: Diagnosis present

## 2013-04-15 LAB — BASIC METABOLIC PANEL
BUN: 49 mg/dL — ABNORMAL HIGH (ref 6–23)
CO2: 27 mEq/L (ref 19–32)
Chloride: 99 mEq/L (ref 96–112)
Glucose, Bld: 142 mg/dL — ABNORMAL HIGH (ref 70–99)
Potassium: 3.3 mEq/L — ABNORMAL LOW (ref 3.5–5.1)

## 2013-04-15 LAB — HEMOGLOBIN A1C: Mean Plasma Glucose: 126 mg/dL — ABNORMAL HIGH (ref <117)

## 2013-04-15 LAB — POCT I-STAT TROPONIN I

## 2013-04-15 MED ORDER — LABETALOL HCL 200 MG PO TABS
200.0000 mg | ORAL_TABLET | Freq: Three times a day (TID) | ORAL | Status: DC
  Administered 2013-04-15 – 2013-04-17 (×8): 200 mg via ORAL
  Filled 2013-04-15 (×10): qty 1

## 2013-04-15 MED ORDER — LABETALOL HCL 5 MG/ML IV SOLN
INTRAVENOUS | Status: AC
  Administered 2013-04-15: 20 mg
  Filled 2013-04-15: qty 4

## 2013-04-15 MED ORDER — POTASSIUM CHLORIDE CRYS ER 20 MEQ PO TBCR
40.0000 meq | EXTENDED_RELEASE_TABLET | Freq: Once | ORAL | Status: AC
  Administered 2013-04-15: 40 meq via ORAL
  Filled 2013-04-15: qty 2

## 2013-04-15 MED ORDER — NICARDIPINE HCL IN NACL 20-0.86 MG/200ML-% IV SOLN
5.0000 mg/h | INTRAVENOUS | Status: DC
  Administered 2013-04-15 (×2): 10 mg/h via INTRAVENOUS
  Filled 2013-04-15 (×2): qty 200

## 2013-04-15 MED ORDER — NITROGLYCERIN IN D5W 200-5 MCG/ML-% IV SOLN: 2.0000 ug/min | INTRAVENOUS | Status: DC

## 2013-04-15 MED ORDER — ISOSORBIDE MONONITRATE ER 30 MG PO TB24
30.0000 mg | ORAL_TABLET | Freq: Every day | ORAL | Status: DC
  Administered 2013-04-15 – 2013-04-17 (×3): 30 mg via ORAL
  Filled 2013-04-15 (×4): qty 1

## 2013-04-15 MED ORDER — ONDANSETRON HCL 4 MG/2ML IJ SOLN: 4.0000 mg | Freq: Three times a day (TID) | INTRAMUSCULAR | Status: DC | PRN

## 2013-04-15 MED ORDER — ISOSORB DINITRATE-HYDRALAZINE 20-37.5 MG PO TABS
1.0000 | ORAL_TABLET | Freq: Three times a day (TID) | ORAL | Status: DC
Start: 2013-04-15 — End: 2013-04-15
  Administered 2013-04-15 (×2): 1 via ORAL
  Filled 2013-04-15 (×3): qty 1

## 2013-04-15 MED ORDER — FUROSEMIDE 10 MG/ML IJ SOLN
100.0000 mg | Freq: Two times a day (BID) | INTRAVENOUS | Status: AC
  Administered 2013-04-15: 100 mg via INTRAVENOUS
  Filled 2013-04-15 (×2): qty 10

## 2013-04-15 MED ORDER — AMLODIPINE BESYLATE 5 MG PO TABS
5.0000 mg | ORAL_TABLET | Freq: Every day | ORAL | Status: DC
  Administered 2013-04-15 – 2013-04-17 (×3): 5 mg via ORAL
  Filled 2013-04-15 (×3): qty 1

## 2013-04-15 MED ORDER — HYDRALAZINE HCL 20 MG/ML IJ SOLN: 10.0000 mg | Freq: Four times a day (QID) | INTRAMUSCULAR | Status: DC | PRN

## 2013-04-15 MED ORDER — HYDRALAZINE HCL 25 MG PO TABS
25.0000 mg | ORAL_TABLET | Freq: Three times a day (TID) | ORAL | Status: DC
  Administered 2013-04-15 – 2013-04-17 (×6): 25 mg via ORAL
  Filled 2013-04-15 (×10): qty 1

## 2013-04-15 MED ORDER — LABETALOL HCL 5 MG/ML IV SOLN: 20.0000 mg | Freq: Once | INTRAVENOUS | Status: AC

## 2013-04-15 MED ORDER — METOLAZONE 5 MG PO TABS
5.0000 mg | ORAL_TABLET | Freq: Once | ORAL | Status: AC
  Administered 2013-04-15: 5 mg via ORAL
  Filled 2013-04-15: qty 1

## 2013-04-15 NOTE — H&P (Signed)
[facility] History and Physical  Devin Bullock [id number] DOB: June 07, 1966 DOA: 04/14/2013  Referring physician: EDP PCP: No PCP Per Patient  Specialists:   Chief Complaint:  SOB  HPI: Devin Bullock is a 47 y.o. male who left AMA yesterday when he was hospitalized at [facility] for New Acute CHF.  He reports that he thought he needed to be at [facility] where a cardiology is.   He reports having increased SOB and and worsening lower leg edema over the past month.   He does not have a doctor and had no previous diagnosed medical conditions.  In the ED he was found to have an initial blood pressure of 210/119 and he was administered medications for HTN Urgency and placed on an IV NTG drip.  He was also found tohave an elevated troponin, and evidence of pulmonary vascular congestion on chest X-ray.  He was given a dose of IV lasix as well and his labs also show that he has a elevated BUN/cr.    He was referred for medical admission.   He denies having chest pain or chest pressure.      Review of Systems: The patient denies anorexia, fever, chills, headaches, weight loss,, vision loss, diplopia, dizziness, decreased hearing, rhinitis, hoarseness, chest pain, syncope, dyspnea on exertion, peripheral edema, balance deficits, cough, hemoptysis, abdominal pain, nausea, vomiting, diarrhea, constipation, hematemesis, melena, hematochezia, severe indigestion/heartburn, dysuria, hematuria, incontinence, muscle weakness, suspicious skin lesions, transient blindness, difficulty walking, depression, unusual weight change, abnormal bleeding, enlarged lymph nodes, angioedema, and breast masses.    Past Medical History  Diagnosis Date  . Enlarged heart   . Hypertension   . Morbid obesity     History reviewed. No pertinent past surgical history.  Prior to Admission medications   Medication Sig Start Date End Date Taking? Authorizing Provider  albuterol (PROVENTIL HFA;VENTOLIN HFA) 108 (90 BASE)  MCG/ACT inhaler Inhale 2 puffs into the lungs every 6 (six) hours as needed for wheezing.   Yes Historical Provider, MD    No Known Allergies  Social History:  reports that he has never smoked. He has never used smokeless tobacco. He reports that he does not drink alcohol or use illicit drugs.     Family History  Problem Relation Age of Onset  . Diabetes Mother        Physical Exam:  GEN:  Pleasant  Morbidly Obese 47 y.o. African American  male  examined  and in no acute distress; cooperative with exam Filed Vitals:   04/14/13 2245 04/14/13 2300 04/14/13 2315 04/14/13 2318  BP:  201/119  197/125  Pulse: 97 101 98   Temp:      TempSrc:      Resp: 23 37 23   Height:      Weight:      SpO2: 99% 93% 97%    Blood pressure 197/125, pulse 98, temperature 98 F (36.7 C), temperature source Oral, resp. rate 23, height 6\' 4"  (1.93 m), weight 148.78 kg (328 lb), SpO2 97.00%. PSYCH: SHe is alert and oriented x4; does not appear anxious does not appear depressed; affect is normal HEENT: Normocephalic and Atraumatic, Mucous membranes pink; PERRLA; EOM intact; Fundi:  Benign;  No scleral icterus, Nares: Patent, Oropharynx: Clear with Fair Dentition,  Dentition, Neck:  FROM, no cervical lymphadenopathy nor thyromegaly or carotid bruit; no JVD; Breasts:: Not examined CHEST WALL: No tenderness CHEST: Normal respiration, clear to auscultation bilaterally HEART: Regular rate and rhythm; no murmurs rubs or gallops  BACK: No kyphosis or scoliosis; no CVA tenderness ABDOMEN: Positive Bowel Sounds, Obese, soft non-tender; no masses, no organomegaly, no pannus; no intertriginous candida. Rectal Exam: Not done EXTREMITIES: No  cyanosis, clubbing or 2+ EDEMA of the BLEs. Genitalia: not examined PULSES: 2+ and symmetric SKIN: Normal hydration no rash or ulceration CNS: Cranial nerves 2-12 grossly intact no focal neurologic deficit    Labs on Admission:  Basic Metabolic Panel:  Recent Labs Lab  04/12/13 2310 04/13/13 0525 04/14/13 2015  NA 138 137 139  K 3.8 3.5 3.5  CL 103 103 100  CO2 26 28 26   GLUCOSE 171* 128* 124*  BUN 50* 51* 48*  CREATININE 4.29* 4.28* 4.26*  CALCIUM 8.3* 8.2* 8.4   Liver Function Tests:  Recent Labs Lab 04/13/13 0525  AST 33  ALT 39  ALKPHOS 90  BILITOT 0.5  PROT 5.5*  ALBUMIN 3.0*   No results found for this basename: LIPASE, AMYLASE,  in the last 168 hours No results found for this basename: AMMONIA,  in the last 168 hours CBC:  Recent Labs Lab 04/12/13 2310 04/13/13 0525 04/14/13 2015  WBC 5.8 5.0 6.5  HGB 13.0 11.8* 12.8*  HCT 38.5* 35.6* 37.1*  MCV 86.7 86.6 85.9  PLT 156 138* 133*   Cardiac Enzymes:  Recent Labs Lab 04/12/13 2310 04/13/13 0525 04/13/13 0950  TROPONINI 0.38* 0.42* 0.39*    BNP (last 3 results)  Recent Labs  04/12/13 2310 04/14/13 2000  PROBNP 5417.0* 6635.0*   CBG:  Recent Labs Lab 04/13/13 0831  GLUCAP 201*    Radiological Exams on Admission: Dg Chest 2 View  04/14/2013   CLINICAL DATA:  Shortness of breath. Bilateral lower extremity swelling.  EXAM: CHEST  2 VIEW  COMPARISON:  03/2013  FINDINGS: Cardiomegaly with vascular congestion. No overt edema. No focal opacities or effusions. No acute bony abnormality.  IMPRESSION: Cardiomegaly, vascular congestion.   Electronically Signed   By: Rolm Baptise   On: 04/14/2013 21:30   US Renal  04/13/2013   *RADIOLOGY REPORT*  Clinical Data: Acute renal injury.  RENAL/URINARY TRACT ULTRASOUND COMPLETE  Comparison:  None.  Findings:  Right Kidney:  9.8 cm in length. Normal renal cortical thickness and echogenicity without focal lesions or hydronephrosis.  Left Kidney:  10.0 cm in length. Normal renal cortical thickness and echogenicity without focal lesions or hydronephrosis.  Bladder:  Normal.  IMPRESSION: Unremarkable renal ultrasound examination.   Original Report Authenticated By: Marijo Sanes, M.D.     EKG: Independently reviewed. Normal Sinus  Rhythm without acute ST changes, +LVH changes present   Assessment/Plan Principal Problem:   Acute diastolic CHF (congestive heart failure) Active Problems:   Hypertensive emergency   SOB (shortness of breath)   ARF (acute renal failure)   Morbid obesity   Anemia  +Troponin/? NSTEMI   1.   Acute Diastolic CHF-   Placed on the Acute CHF protocol to diurese with IV Lasix.  Start Carvediol BID.  Had 2D ECHO on 08/29.    2.   SOB due to #1  3.   HTN Urgency-  IV NTG Drip.     4.   ARF - most likely from long standing uncontrolled HTN.   Monitor BUN /cr, and may need Renal Consult.    5.  +Troponin, ?NSTEMI-  Cycle Troponins, On IV NTG Drip,  No IV heparin due to HTN Crisis.  Cardiology Consulted being seen in Ed by Dr. Doylene Canard.       6.  Anemia- Possibly from Chronic Disease,  Renal Disease and Dilutional from volume Overload.     7.   DVT proprphylaxis with Lovenox.     8.   On Discharge needs to be set up with PCP form Management of his conditions.          Code Status:      FULL CODE Family Communication:  No Family Present  Disposition Plan:    INPATIENT     Time spent:  [address] [facility] Pager [phone]  If 7PM-7AM, please contact night-coverage [email or web address] Password [facility] 04/15/2013, 12:18 AM

## 2013-04-15 NOTE — Progress Notes (Signed)
Notified Md. pts SBP still 200 after NTG gtt at 70 mcgs.   Pt getting headache from NTG gtt.  New orders received.  Will continue to monitor. Darnelle Catalan Gildardo Pounds

## 2013-04-15 NOTE — Progress Notes (Signed)
Md notified.  Will start Cardene as ordered and keep rate at 10mg /hr.  Do not go below SBP 160 per MD.  Will continue to monitor. Darnelle Catalan Gildardo Pounds

## 2013-04-15 NOTE — Consult Note (Signed)
Subjective:  Feeling better. Leg edema and shortness of breath continues. Echocardiogram with mild LV systolic dysfunction, mild MR and mild pericardial effusion  Objective:  Vital Signs in the last 24 hours: Temp:  [97.8 F (36.6 C)-98.3 F (36.8 C)] 98 F (36.7 C) (08/31 0814) Pulse Rate:  [85-109] 85 (08/31 0408) Cardiac Rhythm:  [-] Normal sinus rhythm (08/31 0345) Resp:  [18-48] 22 (08/31 0700) BP: (137-209)/(66-151) 137/82 mmHg (08/31 0700) SpO2:  [93 %-100 %] 93 % (08/31 0700) Weight:  [147.2 kg (324 lb 8.3 oz)-148.78 kg (328 lb)] 147.2 kg (324 lb 8.3 oz) (08/31 0615)  Physical Exam: BP Readings from Last 1 Encounters:  04/15/13 137/82     Wt Readings from Last 1 Encounters:  04/15/13 147.2 kg (324 lb 8.3 oz)    Weight change:   HEENT: [place]/AT, Eyes-Brown, PERL, EOMI, Conjunctiva-Pink, Sclera-Non-icteric Neck: + JVD, No bruit, Trachea midline. Lungs:  Clearing, Bilateral. Cardiac:  Regular rhythm, normal S1 and S2, no S3. II/VI systolic murmur. Abdomen:  Soft, non-tender. Extremities:  2 +_ edema present. No cyanosis. No clubbing. CNS: AxOx3, Cranial nerves grossly intact, moves all 4 extremities. Right handed. Skin: Warm and dry.   Intake/Output from previous day: 08/30 0701 - 08/31 0700 In: 462 [P.O.:100; I.V.:312; IV Piggyback:50] Out: 2875 [Urine:2875]    Lab Results: BMET    Component Value Date/Time   NA 137 04/15/2013 0445   K 3.3* 04/15/2013 0445   CL 99 04/15/2013 0445   CO2 27 04/15/2013 0445   GLUCOSE 142* 04/15/2013 0445   BUN 49* 04/15/2013 0445   CREATININE 4.63* 04/15/2013 0445   CALCIUM 8.0* 04/15/2013 0445   GFRNONAA 14* 04/15/2013 0445   GFRAA 16* 04/15/2013 0445   CBC    Component Value Date/Time   WBC 6.5 04/14/2013 2015   RBC 4.32 04/14/2013 2015   HGB 12.8* 04/14/2013 2015   HCT 37.1* 04/14/2013 2015   PLT 133* 04/14/2013 2015   MCV 85.9 04/14/2013 2015   MCH 29.6 04/14/2013 2015   MCHC 34.5 04/14/2013 2015   RDW 14.2 04/14/2013 2015    CARDIAC ENZYMES Lab Results  Component Value Date   TROPONINI 0.39* 04/13/2013    Scheduled Meds: . amLODipine  5 mg Oral Daily  . enoxaparin (LOVENOX) injection  75 mg Subcutaneous Q24H  . furosemide  100 mg Intravenous Q12H  . isosorbide-hydrALAZINE  1 tablet Oral TID  . labetalol  200 mg Oral TID  . sodium chloride  3 mL Intravenous Q12H   Continuous Infusions:  PRN Meds:.sodium chloride, acetaminophen, albuterol, hydrALAZINE, ondansetron (ZOFRAN) IV, sodium chloride  Assessment/Plan: Acute left heart systolic failure  Hypertension, improving  Morbid Obesity  Dilated cardiomyopathy Mild MR Mild pericardial effusion CKD, IV  Discussed importance of diet and exercise. Add Norvasc and Bidil for blood pressure control and CHF.   LOS: 1 day    Dixie Dials  MD  04/15/2013, 8:19 AM

## 2013-04-15 NOTE — Progress Notes (Addendum)
[facility] Progress Note [place] TEAM 1 - Stepdown/ICU TEAM   Devin Bullock [id number] DOB: [birth year] DOA: 04/14/2013 PCP: No PCP Per Patient  Brief narrative: Devin Bullock is a 47 y.o. male who left AMA when he was hospitalized at [facility] for New Acute CHF. He reports having increased SOB and and worsening lower leg edema over the past month. He does not have a doctor. In the ED he was found to have an initial blood pressure of 210/119 and he was administered medications for HTN Urgency and placed on an IV NTG drip. He was also found tohave an elevated troponin, and evidence of pulmonary vascular congestion on chest X-ray. He was given a dose of IV lasix as well and his labs also show that he has a elevated BUN/cr. He was referred for medical admission. He denies having chest pain or chest pressure.   Of note he was seen in the ER on 12/12 and was told he had HTN- he left AMA at that point as well.    Assessment/Plan: Principal Problem:   Acute systolic and diastolic CHF (congestive heart failure) - now resolved - explained in detail the severity of his diastolic CHF and the importance of controlled BP  Active Problems:   Hypertensive emergency - resolved - monitor on PO meds and d/c tomorrow if stable - will obtain insurance on Oct 1 and hopefully will be able to afford Bidil be then - will change Bidil to separate components so he will be able to buy from [facility] drug list  - also on Norvasc- will need assistance from case management  Elevated Troponins - due to cardiac strain  Mild pericardial effusion  AKI vs CKD 4? No old labs to compare with- may be all acute as renal ultrasound does not reveal any renal abnormality consistent with medical- renal disease    Morbid obesity    Anemia - may be dilutional- repeat tomorrow    Code Status: full code Family Communication: none Disposition Plan: transfer to  tel  Consultants: cardio  Procedures: none  Antibiotics: none  DVT prophylaxis: Lovenox  HPI/Subjective: Alert, tell me he has never been diagnosed with HTN in the past. Have had a long conversation with him in regards to complications of HTN and importance of managing it.    Objective: Blood pressure 146/89, pulse 85, temperature 97.5 F (36.4 C), temperature source Oral, resp. rate 32, height 6\' 4"  (1.93 m), weight 147.2 kg (324 lb 8.3 oz), SpO2 96.00%.  Intake/Output Summary (Last 24 hours) at 04/15/13 1709 Last data filed at 04/15/13 1605  Gross per 24 hour  Intake 711.97 ml  Output   3975 ml  Net -3263.03 ml     Exam: General: Morbidly obese No acute respiratory distress Lungs: Clear to auscultation bilaterally without wheezes or crackles Cardiovascular: Regular rate and rhythm without murmur gallop or rub normal S1 and S2 Abdomen: Nontender, nondistended, soft, bowel sounds positive, no rebound, no ascites, no appreciable mass Extremities: No significant cyanosis, clubbing, or edema bilateral lower extremities  Data Reviewed: Basic Metabolic Panel:  Recent Labs Lab 04/12/13 2310 04/13/13 0525 04/14/13 2015 04/15/13 0445  NA 138 137 139 137  K 3.8 3.5 3.5 3.3*  CL 103 103 100 99  CO2 26 28 26 27   GLUCOSE 171* 128* 124* 142*  BUN 50* 51* 48* 49*  CREATININE 4.29* 4.28* 4.26* 4.63*  CALCIUM 8.3* 8.2* 8.4 8.0*   Liver Function Tests:  Recent Labs Lab 04/13/13 0525  AST 33  ALT 39  ALKPHOS 90  BILITOT 0.5  PROT 5.5*  ALBUMIN 3.0*   No results found for this basename: LIPASE, AMYLASE,  in the last 168 hours No results found for this basename: AMMONIA,  in the last 168 hours CBC:  Recent Labs Lab 04/12/13 2310 04/13/13 0525 04/14/13 2015  WBC 5.8 5.0 6.5  HGB 13.0 11.8* 12.8*  HCT 38.5* 35.6* 37.1*  MCV 86.7 86.6 85.9  PLT 156 138* 133*   Cardiac Enzymes:  Recent Labs Lab 04/12/13 2310 04/13/13 0525 04/13/13 0950  TROPONINI  0.38* 0.42* 0.39*   BNP (last 3 results)  Recent Labs  04/12/13 2310 04/14/13 2000  PROBNP 5417.0* 6635.0*   CBG:  Recent Labs Lab 04/13/13 0831  GLUCAP 201*    Recent Results (from the past 240 hour(s))  MRSA PCR SCREENING     Status: None   Collection Time    04/13/13  3:59 AM      Result Value Range Status   MRSA by PCR NEGATIVE  NEGATIVE Final   Comment:            The GeneXpert MRSA Assay (FDA     approved for NASAL specimens     only), is one component of a     comprehensive MRSA colonization     surveillance program. It is not     intended to diagnose MRSA     infection nor to guide or     monitor treatment for     MRSA infections.     Studies:  Recent x-ray studies have been reviewed in detail by the Attending Physician  Scheduled Meds:  Scheduled Meds: . amLODipine  5 mg Oral Daily  . enoxaparin (LOVENOX) injection  75 mg Subcutaneous Q24H  . furosemide  100 mg Intravenous Q12H  . isosorbide-hydrALAZINE  1 tablet Oral TID  . labetalol  200 mg Oral TID  . sodium chloride  3 mL Intravenous Q12H   Continuous Infusions:   Time spent on care of this patient:  > 35 min   Samamtha Tiegs, MD  [facility] Office  [phone] Pager - Text Page per Shea Evans as per below:  On-Call/Text Page:      [email or web address]      password [email or web address]  If 7PM-7AM, please contact night-coverage [email or web address] Password [email or web address] 04/15/2013, 5:09 PM   LOS: 1 day

## 2013-04-15 NOTE — Progress Notes (Signed)
Md notified.  pts SBP still less than 160 after Ntg gtt off at 0600.  Will decrease Cardene gtt to 5mg /hr, and try to wean off.  Pt resting with friend at bedside.  Will continue to monitor. Darnelle Catalan Gildardo Pounds

## 2013-04-16 LAB — GLUCOSE, CAPILLARY

## 2013-04-16 LAB — BASIC METABOLIC PANEL
Calcium: 7.6 mg/dL — ABNORMAL LOW (ref 8.4–10.5)
Creatinine, Ser: 5.04 mg/dL — ABNORMAL HIGH (ref 0.50–1.35)
GFR calc non Af Amer: 12 mL/min — ABNORMAL LOW (ref >90)
Sodium: 138 mEq/L (ref 135–145)

## 2013-04-16 MED ORDER — POTASSIUM CHLORIDE CRYS ER 20 MEQ PO TBCR
40.0000 meq | EXTENDED_RELEASE_TABLET | Freq: Once | ORAL | Status: AC
  Administered 2013-04-16: 40 meq via ORAL

## 2013-04-16 MED ORDER — POTASSIUM CHLORIDE CRYS ER 20 MEQ PO TBCR
EXTENDED_RELEASE_TABLET | ORAL | Status: AC
  Filled 2013-04-16: qty 2

## 2013-04-16 MED ORDER — POTASSIUM CHLORIDE CRYS ER 20 MEQ PO TBCR
40.0000 meq | EXTENDED_RELEASE_TABLET | Freq: Once | ORAL | Status: AC
  Administered 2013-04-16: 40 meq via ORAL
  Filled 2013-04-16: qty 2

## 2013-04-16 NOTE — Progress Notes (Signed)
Pt given handouts on medications, diet and heart failure.

## 2013-04-16 NOTE — Progress Notes (Signed)
Subjective:  Feeling better. Significant diuresis and lowering of blood pressure. Afebrile.  Objective:  Vital Signs in the last 24 hours: Temp:  [97.5 F (36.4 C)-98.4 F (36.9 C)] 98.4 F (36.9 C) (09/01 0500) Pulse Rate:  [86-90] 88 (09/01 0500) Cardiac Rhythm:  [-] Normal sinus rhythm (08/31 1950) Resp:  [20-35] 20 (09/01 0500) BP: (109-187)/(49-133) 163/101 mmHg (09/01 1021) SpO2:  [92 %-98 %] 93 % (09/01 0500) Weight:  [147.3 kg (324 lb 11.8 oz)] 147.3 kg (324 lb 11.8 oz) (09/01 0700)  Physical Exam: BP Readings from Last 1 Encounters:  04/16/13 163/101     Wt Readings from Last 1 Encounters:  04/16/13 147.3 kg (324 lb 11.8 oz)    Weight change: -1.48 kg (-3 lb 4.2 oz)  HEENT: [place]/AT, Eyes-Brown, PERL, EOMI, Conjunctiva-Pink, Sclera-Non-icteric Neck: No JVD, No bruit, Trachea midline. Lungs:  Clear, Bilateral. Cardiac:  Regular rhythm, normal S1 and S2, no S3. II/VI systolic murmur Abdomen:  Soft, non-tender. Extremities:  Trace edema present. No cyanosis. No clubbing. CNS: AxOx3, Cranial nerves grossly intact, moves all 4 extremities. Right handed. Skin: Warm and dry.   Intake/Output from previous day: 08/31 0701 - 09/01 0700 In: 216.7 [I.V.:156.7; IV Piggyback:60] Out: [id number] [Urine:4125]    Lab Results: BMET    Component Value Date/Time   NA 138 04/16/2013 0426   K 3.0* 04/16/2013 0426   CL 96 04/16/2013 0426   CO2 30 04/16/2013 0426   GLUCOSE 165* 04/16/2013 0426   BUN 58* 04/16/2013 0426   CREATININE 5.04* 04/16/2013 0426   CALCIUM 7.6* 04/16/2013 0426   GFRNONAA 12* 04/16/2013 0426   GFRAA 14* 04/16/2013 0426   CBC    Component Value Date/Time   WBC 6.5 04/14/2013 2015   RBC 4.32 04/14/2013 2015   HGB 12.8* 04/14/2013 2015   HCT 37.1* 04/14/2013 2015   PLT 133* 04/14/2013 2015   MCV 85.9 04/14/2013 2015   MCH 29.6 04/14/2013 2015   MCHC 34.5 04/14/2013 2015   RDW 14.2 04/14/2013 2015   CARDIAC ENZYMES Lab Results  Component Value Date   TROPONINI 0.39* 04/13/2013     Scheduled Meds: . amLODipine  5 mg Oral Daily  . enoxaparin (LOVENOX) injection  75 mg Subcutaneous Q24H  . hydrALAZINE  25 mg Oral Q8H  . isosorbide mononitrate  30 mg Oral Daily  . labetalol  200 mg Oral TID  . potassium chloride SA      . potassium chloride  40 mEq Oral Once  . sodium chloride  3 mL Intravenous Q12H   Continuous Infusions:  PRN Meds:.sodium chloride, acetaminophen, albuterol, hydrALAZINE, ondansetron (ZOFRAN) IV, sodium chloride  Assessment/Plan: Acute left heart systolic failure  Hypertension, improving  Morbid Obesity  Dilated cardiomyopathy  Mild MR  Mild pericardial effusion  CKD, IV  Increase activity. Home soon.   LOS: 2 days    Dixie Dials  MD  04/16/2013, 11:00 AM

## 2013-04-16 NOTE — Progress Notes (Signed)
[facility] PROGRESS NOTE  Devin Bullock [id number] DOB: [birth year] DOA: 04/14/2013 PCP: No PCP Per Patient  Assessment/Plan: Principal Problem:   Acute on chronic combined systolic and diastolic CHF (congestive heart failure) Active Problems:   Hypertensive emergency   ARF (acute renal failure)   SOB (shortness of breath)   Morbid obesity   Anemia      1. Acute systolic and diastolic CHF (congestive heart failure): Patient presented with progressive SOBOE and worsening bilateral LE edema, over a 38-month period. CXR demonstrated cardiomegaly, vascular congestion, and ProBNP was elevated at 6635.0, consistent with acute decompensation of CHF. Managed with iv Diuretics, with rather dramatic clinical effect, and brisk diuresis. 2D Echocardiogram revealed severely dilated LV cavity, severe LVH, EF of 45% to 50% and no regional wall motion abnormalities. There was fusion of early and atrial contributions to ventricular filling. LA was severely dilated, RA was moderately dilated, and a moderate, free-flowing pericardial effusion was identified circumferential to the heart, without evidence of hemodynamic compromise. Dr Doylene Canard provided cardiology consultation,. And has been very helpful in rationalizing heart failure therapy. Managing as recommended, and patient is clinically much improved. Renal dysfunction at this time, precludes cardiac catheterization.  2. Hypertensive emergency:  On initial evaluation in the ED, patient was found to have a BP of 210/119, which was aggressively addressed with parenteral antihypertensive,and ivi NTG. With improved control, he was subsequently transitioned to Norvasc, Hydralazine, Imdur and Labetalol, adjusted as indicated. 3. Elevated Troponins: Patient did experience a bump in cardiac troponins following admission, without chest pain. 12-lead EKG showed no acute ischemic changes, and no regional wall motion abnormalities were seen on 2D Echocardiogram.  Likely, this was due to demand ischemia.  4. Renal failure: Creatinine on admission was 4.26, BUN 48, consistent with renal failure, although acuity is unclear, due to lack of baseline creatinine data. Renal U/S was quite unremarkable. Creatinine has continued to creep up during hospitalization, and is 5.04 today, despite moderate dose of diuretics. Will request nephrology consultation on 04/17/13, if no improvement by then.   5. Onychogryphosis: Patient has severe onychogryphosis, and will benefit from podiatric attention. This can be arranged by PMD.       Code Status: Full Code.  Family Communication:  Disposition Plan: To be determined.    Brief narrative: 47 y.o. male with known history of morbid obesity, HTN, renal dysfunction, hospitalized at [facility] on 04/13/13, for acute decompensation of CHF, but left AMA. ho left AMA when he was hospitalized at [facility] for New Acute CHF. He reports having increased SOB and and worsening lower leg edema over the past month. He does not have a PMD. In the ED, he was found to have an initial blood pressure of 210/119 and he was administered medications for HTN Urgency and placed on an IV NTG drip. He was also found to have an elevated troponin, and evidence of pulmonary vascular congestion on chest X-ray. He was given a dose of IV lasix as well. He denies having chest pain or chest pressure. Labs revealed elevated BUN/Creat. Admitted for further management.    Consultants:  Dr Dixie Dials, cardiologist.   Procedures:  CXR.   Antibiotics:  N/A.   HPI/Subjective: Feels much better.   Objective: Vital signs in last 24 hours: Temp:  [97.5 F (36.4 C)-98.4 F (36.9 C)] 98.1 F (36.7 C) (09/01 1344) Pulse Rate:  [86-90] 89 (09/01 1344) Resp:  [18-32] 18 (09/01 1344) BP: (129-187)/(62-133) 183/85 mmHg (09/01 1344) SpO2:  [91 %-  96 %] 91 % (09/01 1344) Weight:  [147.3 kg (324 lb 11.8 oz)] 147.3 kg (324 lb 11.8 oz) (09/01  0700) Weight change: -1.48 kg (-3 lb 4.2 oz) Last BM Date: 04/14/13  Intake/Output from previous day: 08/31 0701 - 09/01 0700 In: 216.7 [I.V.:156.7; IV Piggyback:60] Out: [id number] [Urine:4125] Total I/O In: 240 [P.O.:240] Out: 600 [Urine:600]   Physical Exam: General: Comfortable, alert, communicative, fully oriented, not short of breath at rest.  HEENT:  No clinical pallor, no jaundice, no conjunctival injection or discharge. NECK:  Supple, JVP not seen, no carotid bruits, no palpable lymphadenopathy, no palpable goiter. CHEST:  Clinically clear to auscultation, no wheezes, no crackles. HEART:  Sounds 1 and 2 heard, normal, regular, no murmurs. ABDOMEN:  Obese, soft, non-tender, no palpable organomegaly, no palpable masses, normal bowel sounds. GENITALIA:  Not examined. LOWER EXTREMITIES:  Moderate pitting edema, palpable peripheral pulses. Has severe onychogryphosis.  MUSCULOSKELETAL SYSTEM:  unremarkable. CENTRAL NERVOUS SYSTEM:  No focal neurologic deficit on gross examination.  Lab Results:  Recent Labs  04/14/13 2015  WBC 6.5  HGB 12.8*  HCT 37.1*  PLT 133*    Recent Labs  04/15/13 0445 04/16/13 0426  NA 137 138  K 3.3* 3.0*  CL 99 96  CO2 27 30  GLUCOSE 142* 165*  BUN 49* 58*  CREATININE 4.63* 5.04*  CALCIUM 8.0* 7.6*   Recent Results (from the past 240 hour(s))  MRSA PCR SCREENING     Status: None   Collection Time    04/13/13  3:59 AM      Result Value Range Status   MRSA by PCR NEGATIVE  NEGATIVE Final   Comment:            The GeneXpert MRSA Assay (FDA     approved for NASAL specimens     only), is one component of a     comprehensive MRSA colonization     surveillance program. It is not     intended to diagnose MRSA     infection nor to guide or     monitor treatment for     MRSA infections.     Studies/Results: Dg Chest 2 View  04/14/2013   CLINICAL DATA:  Shortness of breath. Bilateral lower extremity swelling.  EXAM: CHEST  2 VIEW   COMPARISON:  03/2013  FINDINGS: Cardiomegaly with vascular congestion. No overt edema. No focal opacities or effusions. No acute bony abnormality.  IMPRESSION: Cardiomegaly, vascular congestion.   Electronically Signed   By: Rolm Baptise   On: 04/14/2013 21:30    Medications: Scheduled Meds: . amLODipine  5 mg Oral Daily  . enoxaparin (LOVENOX) injection  75 mg Subcutaneous Q24H  . hydrALAZINE  25 mg Oral Q8H  . isosorbide mononitrate  30 mg Oral Daily  . labetalol  200 mg Oral TID  . potassium chloride SA      . sodium chloride  3 mL Intravenous Q12H   Continuous Infusions:  PRN Meds:.sodium chloride, acetaminophen, albuterol, hydrALAZINE, ondansetron (ZOFRAN) IV, sodium chloride    LOS: 2 days   Dian Minahan,CHRISTOPHER  [facility] Pager [phone]. If 8PM-8AM, please contact night-coverage at [email or web address], password [facility] 04/16/2013, 1:58 PM  LOS: 2 days

## 2013-04-17 DIAGNOSIS — Z91199 Patient's noncompliance with other medical treatment and regimen due to unspecified reason: Secondary | ICD-10-CM

## 2013-04-17 DIAGNOSIS — N184 Chronic kidney disease, stage 4 (severe): Secondary | ICD-10-CM | POA: Insufficient documentation

## 2013-04-17 DIAGNOSIS — Z9119 Patient's noncompliance with other medical treatment and regimen: Secondary | ICD-10-CM

## 2013-04-17 LAB — GLUCOSE, CAPILLARY
Glucose-Capillary: 145 mg/dL — ABNORMAL HIGH (ref 70–99)
Glucose-Capillary: 103 mg/dL — ABNORMAL HIGH (ref 70–99)

## 2013-04-17 LAB — BASIC METABOLIC PANEL
CO2: 31 mEq/L (ref 19–32)
Calcium: 8.2 mg/dL — ABNORMAL LOW (ref 8.4–10.5)
Chloride: 97 mEq/L (ref 96–112)
Glucose, Bld: 103 mg/dL — ABNORMAL HIGH (ref 70–99)
Sodium: 138 mEq/L (ref 135–145)

## 2013-04-17 LAB — PHOSPHORUS: Phosphorus: 5 mg/dL — ABNORMAL HIGH (ref 2.3–4.6)

## 2013-04-17 LAB — CBC
HCT: 36.6 % — ABNORMAL LOW (ref 39.0–52.0)
Hemoglobin: 12.4 g/dL — ABNORMAL LOW (ref 13.0–17.0)
MCV: 86.5 fL (ref 78.0–100.0)
RBC: 4.23 MIL/uL (ref 4.22–5.81)
RDW: 14.3 % (ref 11.5–15.5)
WBC: 4.5 10*3/uL (ref 4.0–10.5)

## 2013-04-17 MED ORDER — AMLODIPINE BESYLATE 5 MG PO TABS
5.0000 mg | ORAL_TABLET | Freq: Once | ORAL | Status: DC
  Filled 2013-04-17: qty 1

## 2013-04-17 MED ORDER — AMLODIPINE BESYLATE 10 MG PO TABS: 10.0000 mg | ORAL_TABLET | Freq: Every day | ORAL | Status: DC

## 2013-04-17 MED ORDER — LABETALOL HCL 100 MG PO TABS
100.0000 mg | ORAL_TABLET | Freq: Once | ORAL | Status: AC
  Administered 2013-04-17: 100 mg via ORAL
  Filled 2013-04-17: qty 1

## 2013-04-17 MED ORDER — LABETALOL HCL 300 MG PO TABS: 300.0000 mg | ORAL_TABLET | Freq: Two times a day (BID) | ORAL | Status: DC

## 2013-04-17 MED ORDER — FUROSEMIDE 80 MG PO TABS: 160.0000 mg | ORAL_TABLET | Freq: Two times a day (BID) | ORAL | Status: DC

## 2013-04-17 MED ORDER — FUROSEMIDE 80 MG PO TABS
160.0000 mg | ORAL_TABLET | Freq: Two times a day (BID) | ORAL | Status: DC
  Filled 2013-04-17 (×2): qty 2

## 2013-04-17 MED ORDER — FUROSEMIDE 10 MG/ML IJ SOLN
80.0000 mg | Freq: Two times a day (BID) | INTRAMUSCULAR | Status: DC
  Administered 2013-04-17: 80 mg via INTRAVENOUS
  Filled 2013-04-17: qty 8

## 2013-04-17 MED ORDER — LABETALOL HCL 300 MG PO TABS
300.0000 mg | ORAL_TABLET | Freq: Two times a day (BID) | ORAL | Status: DC
  Filled 2013-04-17: qty 1

## 2013-04-17 MED ORDER — ISOSORBIDE MONONITRATE ER 30 MG PO TB24: 30.0000 mg | ORAL_TABLET | Freq: Every day | ORAL | Status: DC

## 2013-04-17 MED ORDER — POTASSIUM CHLORIDE CRYS ER 20 MEQ PO TBCR
40.0000 meq | EXTENDED_RELEASE_TABLET | Freq: Once | ORAL | Status: AC
  Administered 2013-04-17: 40 meq via ORAL
  Filled 2013-04-17: qty 2

## 2013-04-17 MED ORDER — HYDRALAZINE HCL 25 MG PO TABS: 25.0000 mg | ORAL_TABLET | Freq: Three times a day (TID) | ORAL | Status: DC

## 2013-04-17 NOTE — Consult Note (Signed)
Reason for Consult: AKI vs. CKD Referring Physician: Dr. Debbe Odea  Devin Bullock is an 47 y.o. male with AKI.  HPI: Mr. Devin Bullock is a 47 y.o. AA male with PMH HTN, obesity who presented to [facility] with increasing SOB and LE swelling. He had cardiomegaly and vascular congestion on CXR, an echo performed that showed EF 45-50% and severe left ventricular dilation, LVH, and pericardial effusion. On admission he had creatinine of 4.26 which has increased to 5.02 today, thought due to elevated BP (210s/110s on admission, improved to 160s/90s with IV NTG transitioned to norvasc, hydralazine, imdur, and labetalol). Renal ultrasound this admission has been WNL. Patient has had increased urinary volume/frequency with lasix and denies dysuria. His fluid overload state responded well to diuresis and he currently denies symptoms of dyspnea. He is able to ambulate to bathroom well without difficulty.   For blood pressure management, he states he used to take "amlodipine" over a year ago (prescribed by Dr. Minna Antis) that would help the swelling in his feet but then it "did not work anymore." He restarted taking this 2 months ago (using a friend's medication) and ran out 1 month ago.  HTN 8-10 yr but has not taken meds regularly that whole time.  Father/mother/brother with HTN.  No FH of renal disease, inherited hearing, eye, or ms skel defects.  No hx UTIs, stones, or voiding sx.  Recent ^ ankle edema, PND, 3 pillow orthopnea.   Past Medical History  Diagnosis Date  . Enlarged heart   . Hypertension   . Morbid obesity   H/o treated gonorrhea 10 years ago No reported PMH hearing, vision, or MSK diseases or renal stone/UTI hx.  History reviewed. No pertinent past surgical history.  Family History  Problem Relation Age of Onset  . Diabetes Mother   Dad deceased secondary to stroke >62 years old Mom deceased secondary to MI >79 years old, also h/o HTN and DM Brother with HTN No known FH of hearing, vision, or  musculoskeletal disease.  Social History:  reports that he has never smoked. He has never used smokeless tobacco. He reports that he does not drink alcohol or use illicit drugs.  Allergies: No Known Allergies  Medications:  I have reviewed the patient's current medications. Scheduled: . amLODipine  5 mg Oral Daily  . enoxaparin (LOVENOX) injection  75 mg Subcutaneous Q24H  . furosemide  80 mg Intravenous BID  . hydrALAZINE  25 mg Oral Q8H  . isosorbide mononitrate  30 mg Oral Daily  . labetalol  200 mg Oral TID  . sodium chloride  3 mL Intravenous Q12H    Results for orders placed during the hospital encounter of 04/14/13 (from the past 48 hour(s))  BASIC METABOLIC PANEL     Status: Abnormal   Collection Time    04/16/13  4:26 AM      Result Value Range   Sodium 138  135 - 145 mEq/L   Potassium 3.0 (*) 3.5 - 5.1 mEq/L   Chloride 96  96 - 112 mEq/L   CO2 30  19 - 32 mEq/L   Glucose, Bld 165 (*) 70 - 99 mg/dL   BUN 58 (*) 6 - 23 mg/dL   Creatinine, Ser 5.04 (*) 0.50 - 1.35 mg/dL   Calcium 7.6 (*) 8.4 - 10.5 mg/dL   GFR calc non Af Amer 12 (*) >90 mL/min   GFR calc Af Amer 14 (*) >90 mL/min   Comment: (NOTE)     The  eGFR has been calculated using the CKD EPI equation.     This calculation has not been validated in all clinical situations.     eGFR's persistently <90 mL/min signify possible Chronic Kidney     Disease.  GLUCOSE, CAPILLARY     Status: Abnormal   Collection Time    04/16/13  4:26 PM      Result Value Range   Glucose-Capillary 124 (*) 70 - 99 mg/dL  GLUCOSE, CAPILLARY     Status: Abnormal   Collection Time    04/16/13  8:40 PM      Result Value Range   Glucose-Capillary 100 (*) 70 - 99 mg/dL   Comment 1 Notify RN    BASIC METABOLIC PANEL     Status: Abnormal   Collection Time    04/17/13  4:38 AM      Result Value Range   Sodium 138  135 - 145 mEq/L   Potassium 3.3 (*) 3.5 - 5.1 mEq/L   Chloride 97  96 - 112 mEq/L   CO2 31  19 - 32 mEq/L   Glucose,  Bld 103 (*) 70 - 99 mg/dL   BUN 53 (*) 6 - 23 mg/dL   Creatinine, Ser 5.02 (*) 0.50 - 1.35 mg/dL   Calcium 8.2 (*) 8.4 - 10.5 mg/dL   GFR calc non Af Amer 12 (*) >90 mL/min   GFR calc Af Amer 14 (*) >90 mL/min   Comment: (NOTE)     The eGFR has been calculated using the CKD EPI equation.     This calculation has not been validated in all clinical situations.     eGFR's persistently <90 mL/min signify possible Chronic Kidney     Disease.  PRO B NATRIURETIC PEPTIDE     Status: Abnormal   Collection Time    04/17/13  4:38 AM      Result Value Range   Pro B Natriuretic peptide (BNP) 2033.0 (*) 0 - 125 pg/mL  CBC     Status: Abnormal   Collection Time    04/17/13  4:38 AM      Result Value Range   WBC 4.5  4.0 - 10.5 K/uL   RBC 4.23  4.22 - 5.81 MIL/uL   Hemoglobin 12.4 (*) 13.0 - 17.0 g/dL   HCT 36.6 (*) 39.0 - 52.0 %   MCV 86.5  78.0 - 100.0 fL   MCH 29.3  26.0 - 34.0 pg   MCHC 33.9  30.0 - 36.0 g/dL   RDW 14.3  11.5 - 15.5 %   Platelets 141 (*) 150 - 400 K/uL  GLUCOSE, CAPILLARY     Status: Abnormal   Collection Time    04/17/13  7:48 AM      Result Value Range   Glucose-Capillary 145 (*) 70 - 99 mg/dL    No results found.  Review of systems: He denies any chest pain, dyspnea, headache, vision changes, current shortness of breath, rash, abdominal pain, changes in bowel or bladder function (other than increased frequency with lasix), muscle pains, dysuria, fevers, chills, or other complaints.   Blood pressure 169/99, pulse 83, temperature 97.9 F (36.6 C), temperature source Oral, resp. rate 18, height 6\' 4"  (1.93 m), weight 309 lb 12.8 oz (140.524 kg), SpO2 100.00%. Physical Examination: General appearance - alert, well appearing, and in no distress and obese Mental status - alert, oriented to person, place, and time, normal mood, behavior, speech, dress, motor activity, and thought processes Head: Left posterior auricular  soft tissue mass (4cm oval shape), firm, nontender,  nonerythematous, nonexudative Eyes - pupils equal, round, and reactive, extraocular eye movements intact  Fundi with art narrowing, silver wiring, AV nicking Mouth - mucous membranes moist, pharynx normal without lesions Neck - supple, no significant adenopathy Chest - fine crackles apex bilaterally, decreased breath sounds bilateral bases, normal effort, no cough or dyspnea Heart - normal rate, regular rhythm, normal S1, S2, no murmurs, rubs, clicks or gallops, 2+ DP and radial pulses bilaterally  Gr2/6 M Abdomen - obese, soft, nontender, nondistended, no masses or organomegaly Back exam - full range of motion, no tenderness, palpable spasm or pain on motion Extremities - 2+ lower extremity edema bilaterally to mid-shin Skin - normal coloration and turgor, no rashes, no suspicious skin lesions noted  Assessment/Plan: 47 y.o. AA male with PMH HTN and obesity who presented with decompensated heart failure and elevated creatinine, likely chronic kidney disease due to chronic uncontrolled HTN.  1. CKD - Stage 4, Likely secondary to chronic hypertension. Though no creatinine in system before this admission, this has likely been a chronic process in patient with little-no medical no signs of infection, dehydration, or other reason for AKI. On admission, pt had severely elevated BP and renal imaging with kidneys 9-10cm (small for a patient of his size) which speaks to possible scarring from chronic hypertension. It is unlikely this will return to normal, patient may need hemodialysis but will start with medical management and observation. - BP control is key, with goal ~140/90 or slightly less. Lasix 160mg  PO BID for continued diuresis until patient appears less fluid overloaded. Amlodipine 10mg  qhs and labetalol 300mg  BID - Patient not currently acidotic. Checking uric acid. - Anemic with Hgb 12.4; will manage as outpatient - Checking PTH, phosphorous. - Will check basic chemistries ~1 week after  discharge. Appt made for lab/bp nurse visit at [facility] 9/9 and visit with Dr. [name] 9/22. At f/u, will discuss treatment options moving forward, including possibility of need for hemodialysis. - Hypokalemia - Mild. Likely secondary to lasix vs severe htn. Far along with CKD 4-5.  Will need close F/u, education , access.    2. Hypertension: Elevated on admission to 210s/110s, likely chronically elevated in pt with no PCP and h/o HTN and FH of HTN. Initially managed with IV NTG, transitioned to labetalol, imdur, amlodipine, and and hydralazine.  - See management above.  3. CHF, diastolic and systolic - managed by primary team and cardiology  4. No PCP - Primary team managing, both primary team and our team have discussed importance of this with patient.  - Per primary, case management being consulted for help setting this up in patient with no insurance (though thinks he will have insurance in 1 month).  5. DVT prophylaxis-  Patient getting lovenox. Will need to be renally dosed if he is staying, though likely being discharged today per primary team. 6. Mild Anemia 7. Suspect HTPH   Thank you for this consult. Will follow patient until ready for discharge per primary team.     Note started by Dwyane Dee, MS4  Conni Slipper PGY-2 Family Practice Resident 04/17/2013, 11:02 AM  I have seen and examined this patient and agree with the plan of care seen, examined, eval, discussed with resident.  Patient counseled on status. .  Karleen Seebeck L 04/17/2013, 12:32 PM

## 2013-04-17 NOTE — Discharge Summary (Signed)
Physician Discharge Summary  Devin Bullock [id number] DOB: 04-09-1966 DOA: 04/14/2013  PCP: No PCP Per Patient  Admit date: 04/14/2013 Discharge date: 04/17/2013  Time spent: 40 minutes  Recommendations for Outpatient Follow-up:  1. Follow up with primary MD.  2. Follow up with Dr Dixie Dials, cardiologist. 3. Follow up with Dr Deterding, nephrologist. 4. Basic chemistries to be checked ~1 week after discharge. Appointment made for labs/BP nurse visit at [facility] 04/24/13 and office visit with Dr. [name] on 05/07/13. At follow up, treatment options moving forward, will be discussed, including possibility of need for hemodialysis.   Discharge Diagnoses:  Principal Problem:   Acute on chronic combined systolic and diastolic CHF (congestive heart failure) Active Problems:   Hypertensive emergency   ARF (acute renal failure)   SOB (shortness of breath)   Morbid obesity   Anemia   Discharge Condition: Satisfactory.   Diet recommendation: Heart-Healthy.   Filed Weights   04/15/13 0615 04/16/13 0700 04/17/13 0558  Weight: 147.2 kg (324 lb 8.3 oz) 147.3 kg (324 lb 11.8 oz) 140.524 kg (309 lb 12.8 oz)    History of present illness:    Hospital Course:  1. Acute systolic and diastolic CHF (congestive heart failure): Patient presented with progressive SOBOE and worsening bilateral LE edema, over a 37-month period. CXR demonstrated cardiomegaly, vascular congestion, and ProBNP was elevated at 6635.0, consistent with acute decompensation of CHF. Managed with iv Diuretics, with rather dramatic clinical effect, and brisk diuresis. 2D Echocardiogram revealed severely dilated LV cavity, severe LVH, EF of 45% to 50% and no regional wall motion abnormalities. There was fusion of early and atrial contributions to ventricular filling. LA was severely dilated, RA was moderately dilated, and a moderate, free-flowing pericardial effusion was identified circumferential to the heart, without evidence  of hemodynamic compromise. Dr Doylene Canard provided cardiology consultation and was very helpful in rationalizing heart failure therapy. Managed as recommended, and patient is clinically much improved. Renal dysfunction at this time, precludes cardiac catheterization. Patient will follow up with Dr Doylene Canard, on discharge. 2. Hypertensive emergency: On initial evaluation in the ED, patient was found to have a BP of 210/119, which was aggressively addressed with parenteral antihypertensive,and ivi NTG. With improved control, he was subsequently transitioned to Norvasc, Hydralazine, Imdur and Labetalol, adjusted as indicated.  3. Elevated Troponins: Patient did experience a bump in cardiac troponins following admission, without chest pain. 12-lead EKG showed no acute ischemic changes, and no regional wall motion abnormalities were seen on 2D Echocardiogram. Likely, this was due to demand ischemia.  4. Renal failure: Creatinine on admission was 4.26, BUN 48, consistent with renal failure, although acuity is unclear, due to lack of baseline creatinine data. Renal U/S was quite unremarkable. Creatinine continued to creep up during hospitalization, and was 5.02 on 04/17/13, despite moderate dose of diuretics. Dr Jeneen Rinks Deterding provided nephrology consultation, and has opined that this is likely CKD-4, secondary to chronic hypertension. Per nephrologist, it is unlikely this will return to normal, patient may need hemodialysis but will start with medical management and observation. Follow up appointment has been arranged.  5. Onychogryphosis: Patient has severe onychogryphosis, and will benefit from podiatric attention. This can be arranged by PMD.    Procedures: See Below.  2D Echocardiogram  Consultations:  Dr Dixie Dials, cardiologist.  Dr Mauricia Area, nephrologist.   Discharge Exam: Filed Vitals:   04/17/13 1233  BP: 158/98  Pulse:   Temp:   Resp:     General: Comfortable, alert, communicative,  fully  oriented, not short of breath at rest.  HEENT: No clinical pallor, no jaundice, no conjunctival injection or discharge.  NECK: Supple, JVP not seen, no carotid bruits, no palpable lymphadenopathy, no palpable goiter.  CHEST: Clinically clear to auscultation, no wheezes, no crackles.  HEART: Sounds 1 and 2 heard, normal, regular, no murmurs.  ABDOMEN: Obese, soft, non-tender, no palpable organomegaly, no palpable masses, normal bowel sounds.  GENITALIA: Not examined.  LOWER EXTREMITIES: Moderate pitting edema, palpable peripheral pulses. Has severe onychogryphosis.  MUSCULOSKELETAL SYSTEM: unremarkable.  CENTRAL NERVOUS SYSTEM: No focal neurologic deficit on gross examination.  Discharge Instructions      Discharge Orders   Future Orders Complete By Expires   Diet - low sodium heart healthy  As directed    Increase activity slowly  As directed        Medication List    STOP taking these medications       albuterol 108 (90 BASE) MCG/ACT inhaler  Commonly known as:  PROVENTIL HFA;VENTOLIN HFA      TAKE these medications       amLODipine 10 MG tablet  Commonly known as:  NORVASC  Take 1 tablet (10 mg total) by mouth at bedtime.  Start taking on:  04/18/2013     furosemide 80 MG tablet  Commonly known as:  LASIX  Take 2 tablets (160 mg total) by mouth 2 (two) times daily.     hydrALAZINE 25 MG tablet  Commonly known as:  APRESOLINE  Take 1 tablet (25 mg total) by mouth every 8 (eight) hours.     isosorbide mononitrate 30 MG 24 hr tablet  Commonly known as:  IMDUR  Take 1 tablet (30 mg total) by mouth daily.     labetalol 300 MG tablet  Commonly known as:  NORMODYNE  Take 1 tablet (300 mg total) by mouth 2 (two) times daily.       No Known Allergies Follow-up Information   Follow up with [facility] On 05/07/2013. (Arrive at 1:30pm for 2pm appointment with Dr. [name] or PA)    Contact information:   [place]  [id number] [phone]      Follow up with [facility] On 04/24/2013. (At 9:15AM for 9:30AM nurse visit for blood pressure and lab follow up)    Contact information:   [place] [id number] [phone]      Schedule an appointment as soon as possible for a visit with Birdie Riddle, MD.   Specialty:  Cardiology   Contact information:   [place] [address] [phone]        The results of significant diagnostics from this hospitalization (including imaging, microbiology, ancillary and laboratory) are listed below for reference.    Significant Diagnostic Studies: Dg Chest 2 View  04/14/2013   CLINICAL DATA:  Shortness of breath. Bilateral lower extremity swelling.  EXAM: CHEST  2 VIEW  COMPARISON:  03/2013  FINDINGS: Cardiomegaly with vascular congestion. No overt edema. No focal opacities or effusions. No acute bony abnormality.  IMPRESSION: Cardiomegaly, vascular congestion.   Electronically Signed   By: Rolm Baptise   On: 04/14/2013 21:30   Dg Chest 2 View  04/12/2013   *RADIOLOGY REPORT*  Clinical Data: Shortness of breath for 24 hours  CHEST - 2 VIEW  Comparison: 12/22/2008  Findings:  Interval increase in the size of the cardiac silhouette.  There is pulmonary venous congestion without frank evidence of edema. Minimal thickening along  the right minor fissure.  No pleural effusion or pneumothorax.  Minimal perihilar atelectasis.  No acute osseous abnormality.  IMPRESSION: 1.  Interval increase in the size of the cardiac silhouette since prior examination performed in 2010.  While possibly representative of cardiomegaly, a pericardial effusion may have a similar appearance.  Further evaluation with cardiac echo may be performed as clinically indicated. 2.  Pulmonary venous congestion without frank evidence of edema.   Original Report Authenticated By: Jake Seats, MD   US Renal  04/13/2013   *RADIOLOGY REPORT*  Clinical Data:  Acute renal injury.  RENAL/URINARY TRACT ULTRASOUND COMPLETE  Comparison:  None.  Findings:  Right Kidney:  9.8 cm in length. Normal renal cortical thickness and echogenicity without focal lesions or hydronephrosis.  Left Kidney:  10.0 cm in length. Normal renal cortical thickness and echogenicity without focal lesions or hydronephrosis.  Bladder:  Normal.  IMPRESSION: Unremarkable renal ultrasound examination.   Original Report Authenticated By: Marijo Sanes, M.D.    Microbiology: Recent Results (from the past 240 hour(s))  MRSA PCR SCREENING     Status: None   Collection Time    04/13/13  3:59 AM      Result Value Range Status   MRSA by PCR NEGATIVE  NEGATIVE Final   Comment:            The GeneXpert MRSA Assay (FDA     approved for NASAL specimens     only), is one component of a     comprehensive MRSA colonization     surveillance program. It is not     intended to diagnose MRSA     infection nor to guide or     monitor treatment for     MRSA infections.     Labs: Basic Metabolic Panel:  Recent Labs Lab 04/13/13 0525 04/14/13 2015 04/15/13 0445 04/16/13 0426 04/17/13 0438  NA 137 139 137 138 138  K 3.5 3.5 3.3* 3.0* 3.3*  CL 103 100 99 96 97  CO2 28 26 27 30 31   GLUCOSE 128* 124* 142* 165* 103*  BUN 51* 48* 49* 58* 53*  CREATININE 4.28* 4.26* 4.63* 5.04* 5.02*  CALCIUM 8.2* 8.4 8.0* 7.6* 8.2*   Liver Function Tests:  Recent Labs Lab 04/13/13 0525  AST 33  ALT 39  ALKPHOS 90  BILITOT 0.5  PROT 5.5*  ALBUMIN 3.0*   No results found for this basename: LIPASE, AMYLASE,  in the last 168 hours No results found for this basename: AMMONIA,  in the last 168 hours CBC:  Recent Labs Lab 04/12/13 2310 04/13/13 0525 04/14/13 2015 04/17/13 0438  WBC 5.8 5.0 6.5 4.5  HGB 13.0 11.8* 12.8* 12.4*  HCT 38.5* 35.6* 37.1* 36.6*  MCV 86.7 86.6 85.9 86.5  PLT 156 138* 133* 141*   Cardiac Enzymes:  Recent Labs Lab 04/12/13 2310 04/13/13 0525 04/13/13 0950   TROPONINI 0.38* 0.42* 0.39*   BNP: BNP (last 3 results)  Recent Labs  04/12/13 2310 04/14/13 2000 04/17/13 0438  PROBNP 5417.0* 6635.0* 2033.0*   CBG:  Recent Labs Lab 04/13/13 0831 04/16/13 1626 04/16/13 2040 04/17/13 0748 04/17/13 1134  GLUCAP 201* 124* 100* 145* 103*       Signed:  Wyoma Genson,CHRISTOPHER  [facility] 04/17/2013, 1:20 PM

## 2013-04-17 NOTE — Progress Notes (Signed)
[facility] PROGRESS NOTE  Jonethan Veltre [id number] DOB: 1966/03/06 DOA: 04/14/2013 PCP: No PCP Per Patient  Assessment/Plan: Principal Problem:   Acute on chronic combined systolic and diastolic CHF (congestive heart failure) Active Problems:   Hypertensive emergency   ARF (acute renal failure)   SOB (shortness of breath)   Morbid obesity   Anemia      1. Acute systolic and diastolic CHF (congestive heart failure): Patient presented with progressive SOBOE and worsening bilateral LE edema, over a 71-month period. CXR demonstrated cardiomegaly, vascular congestion, and ProBNP was elevated at 6635.0, consistent with acute decompensation of CHF. Managed with iv Diuretics, with rather dramatic clinical effect, and brisk diuresis. 2D Echocardiogram revealed severely dilated LV cavity, severe LVH, EF of 45% to 50% and no regional wall motion abnormalities. There was fusion of early and atrial contributions to ventricular filling. LA was severely dilated, RA was moderately dilated, and a moderate, free-flowing pericardial effusion was identified circumferential to the heart, without evidence of hemodynamic compromise. Dr Doylene Canard provided cardiology consultation, and has been very helpful in rationalizing heart failure therapy. Managing as recommended, and patient is clinically much improved. Renal dysfunction at this time, precludes cardiac catheterization.  2. Hypertensive emergency:  On initial evaluation in the ED, patient was found to have a BP of 210/119, which was aggressively addressed with parenteral antihypertensive,and ivi NTG. With improved control, he was subsequently transitioned to Norvasc, Hydralazine, Imdur and Labetalol, adjusted as indicated. 3. Elevated Troponins: Patient did experience a bump in cardiac troponins following admission, without chest pain. 12-lead EKG showed no acute ischemic changes, and no regional wall motion abnormalities were seen on 2D Echocardiogram. Likely,  this was due to demand ischemia.  4. Renal failure: Creatinine on admission was 4.26, BUN 48, consistent with renal failure, although acuity is unclear, due to lack of baseline creatinine data. Renal U/S was quite unremarkable. Creatinine has continued to creep up during hospitalization, and was 5.04 on 04/16/13, despite moderate dose of diuretics. Have consulted Dr [name], nephrologist.   5. Onychogryphosis: Patient has severe onychogryphosis, and will benefit from podiatric attention. This can be arranged by PMD.       Code Status: Full Code.  Family Communication:  Disposition Plan: To be determined.    Brief narrative: 47 y.o. male with known history of morbid obesity, HTN, renal dysfunction, hospitalized at [facility] on 04/13/13, for acute decompensation of CHF, but left AMA. ho left AMA when he was hospitalized at [facility] for New Acute CHF. He reports having increased SOB and and worsening lower leg edema over the past month. He does not have a PMD. In the ED, he was found to have an initial blood pressure of 210/119 and he was administered medications for HTN Urgency and placed on an IV NTG drip. He was also found to have an elevated troponin, and evidence of pulmonary vascular congestion on chest X-ray. He was given a dose of IV lasix as well. He denies having chest pain or chest pressure. Labs revealed elevated BUN/Creat. Admitted for further management.    Consultants:  Dr Dixie Dials, cardiologist.   Procedures:  CXR.   Antibiotics:  N/A.   HPI/Subjective: No new issues.   Objective: Vital signs in last 24 hours: Temp:  [97.8 F (36.6 C)-98.1 F (36.7 C)] 97.9 F (36.6 C) (09/02 0558) Pulse Rate:  [83-89] 83 (09/02 0558) Resp:  [18] 18 (09/02 0558) BP: (168-183)/(85-102) 169/99 mmHg (09/02 0558) SpO2:  [91 %-100 %] 100 % (09/02  [id number]) Weight:  [140.524 kg (309 lb 12.8 oz)] 140.524 kg (309 lb 12.8 oz) (09/02 0558) Weight change: -6.776 kg (-14 lb 15  oz) Last BM Date: 04/15/13  Intake/Output from previous day: 09/01 0701 - 09/02 0700 In: 1200 [P.O.:1200] Out: 3350 [Urine:3350]     Physical Exam: General: Comfortable, alert, communicative, fully oriented, not short of breath at rest.  HEENT:  No clinical pallor, no jaundice, no conjunctival injection or discharge. NECK:  Supple, JVP not seen, no carotid bruits, no palpable lymphadenopathy, no palpable goiter. CHEST:  Clinically clear to auscultation, no wheezes, no crackles. HEART:  Sounds 1 and 2 heard, normal, regular, no murmurs. ABDOMEN:  Obese, soft, non-tender, no palpable organomegaly, no palpable masses, normal bowel sounds. GENITALIA:  Not examined. LOWER EXTREMITIES:  Moderate pitting edema, palpable peripheral pulses. Has severe onychogryphosis.  MUSCULOSKELETAL SYSTEM:  unremarkable. CENTRAL NERVOUS SYSTEM:  No focal neurologic deficit on gross examination.  Lab Results:  Recent Labs  04/14/13 2015 04/17/13 0438  WBC 6.5 4.5  HGB 12.8* 12.4*  HCT 37.1* 36.6*  PLT 133* 141*    Recent Labs  04/16/13 0426 04/17/13 0438  NA 138 138  K 3.0* 3.3*  CL 96 97  CO2 30 31  GLUCOSE 165* 103*  BUN 58* 53*  CREATININE 5.04* 5.02*  CALCIUM 7.6* 8.2*   Recent Results (from the past 240 hour(s))  MRSA PCR SCREENING     Status: None   Collection Time    04/13/13  3:59 AM      Result Value Range Status   MRSA by PCR NEGATIVE  NEGATIVE Final   Comment:            The GeneXpert MRSA Assay (FDA     approved for NASAL specimens     only), is one component of a     comprehensive MRSA colonization     surveillance program. It is not     intended to diagnose MRSA     infection nor to guide or     monitor treatment for     MRSA infections.     Studies/Results: No results found.  Medications: Scheduled Meds: . amLODipine  5 mg Oral Daily  . enoxaparin (LOVENOX) injection  75 mg Subcutaneous Q24H  . furosemide  160 mg Oral BID  . hydrALAZINE  25 mg Oral Q8H   . isosorbide mononitrate  30 mg Oral Daily  . labetalol  200 mg Oral TID  . sodium chloride  3 mL Intravenous Q12H   Continuous Infusions:  PRN Meds:.sodium chloride, acetaminophen, albuterol, hydrALAZINE, ondansetron (ZOFRAN) IV, sodium chloride    LOS: 3 days   Sharran Caratachea,CHRISTOPHER  [facility] Pager [phone]. If 8PM-8AM, please contact night-coverage at [email or web address], password [facility] 04/17/2013, 11:48 AM  LOS: 3 days

## 2013-04-17 NOTE — Care Management Note (Signed)
Case Manager did speak to pt and provided pt with the [facility]. Pt to make his own appointment. No further needs from CM at this time. Linton Ham [phone]

## 2013-04-18 LAB — PTH, INTACT AND CALCIUM
Calcium, Total (PTH): 8.1 mg/dL — ABNORMAL LOW (ref 8.4–10.5)
PTH: 631.5 pg/mL — ABNORMAL HIGH (ref 14.0–72.0)

## 2013-04-26 NOTE — Discharge Summary (Signed)
Patient admitted to the hospital on 04/13/2013 for a Hypertensive Emergency with BP of 220/140 and acute CHF. He was upset that while in the ICU they "wouldn't let me rest" and upset because his BP cuff was inflating every 15 minutes. Despite multiple conversations with him about the risks of him leaving the hospital, he decided to sign out AMA.  Domingo Mend, MD [facility] Pager: [phone]

## 2013-05-02 ENCOUNTER — Other Ambulatory Visit: Payer: Self-pay | Admitting: *Deleted

## 2013-05-02 DIAGNOSIS — Z01810 Encounter for preprocedural cardiovascular examination: Secondary | ICD-10-CM

## 2013-05-02 DIAGNOSIS — N186 End stage renal disease: Secondary | ICD-10-CM

## 2013-05-22 ENCOUNTER — Encounter: Payer: Self-pay | Admitting: Vascular Surgery

## 2013-05-23 ENCOUNTER — Encounter (HOSPITAL_COMMUNITY): Payer: Self-pay

## 2013-05-23 ENCOUNTER — Other Ambulatory Visit (HOSPITAL_COMMUNITY): Payer: Self-pay

## 2013-05-23 ENCOUNTER — Ambulatory Visit: Payer: Self-pay | Admitting: Vascular Surgery

## 2013-08-07 ENCOUNTER — Other Ambulatory Visit: Payer: Self-pay | Admitting: *Deleted

## 2013-08-07 ENCOUNTER — Encounter: Payer: Self-pay | Admitting: Vascular Surgery

## 2013-08-07 DIAGNOSIS — Z01810 Encounter for preprocedural cardiovascular examination: Secondary | ICD-10-CM

## 2013-08-07 DIAGNOSIS — N186 End stage renal disease: Secondary | ICD-10-CM

## 2013-08-08 ENCOUNTER — Ambulatory Visit (INDEPENDENT_AMBULATORY_CARE_PROVIDER_SITE_OTHER)
Admission: RE | Admit: 2013-08-08 | Discharge: 2013-08-08 | Disposition: A | Discharge status: Home / Self Care | Payer: Self-pay | Source: Ambulatory Visit | Attending: Vascular Surgery | Admitting: Vascular Surgery

## 2013-08-08 ENCOUNTER — Other Ambulatory Visit: Payer: Self-pay | Admitting: *Deleted

## 2013-08-08 ENCOUNTER — Encounter: Payer: Self-pay | Admitting: Vascular Surgery

## 2013-08-08 ENCOUNTER — Ambulatory Visit (INDEPENDENT_AMBULATORY_CARE_PROVIDER_SITE_OTHER): Payer: Self-pay | Admitting: Vascular Surgery

## 2013-08-08 ENCOUNTER — Encounter: Payer: Self-pay | Admitting: *Deleted

## 2013-08-08 ENCOUNTER — Ambulatory Visit (HOSPITAL_COMMUNITY)
Admission: RE | Admit: 2013-08-08 | Discharge: 2013-08-08 | Disposition: A | Discharge status: Home / Self Care | Payer: Self-pay | Source: Ambulatory Visit | Attending: Vascular Surgery | Admitting: Vascular Surgery

## 2013-08-08 VITALS — BP 170/109 | HR 71 | Resp 16 | Ht 76.0 in | Wt 321.0 lb

## 2013-08-08 DIAGNOSIS — Z01810 Encounter for preprocedural cardiovascular examination: Secondary | ICD-10-CM

## 2013-08-08 DIAGNOSIS — N186 End stage renal disease: Secondary | ICD-10-CM

## 2013-08-08 DIAGNOSIS — Z992 Dependence on renal dialysis: Secondary | ICD-10-CM | POA: Insufficient documentation

## 2013-08-08 NOTE — Progress Notes (Signed)
[facility]  Patient name: Devin Bullock MRN: [id number] DOB: 1966-04-28 Sex: male  REASON FOR CONSULT: evaluate for hemodialysis access. Referred by Dr. [name].  HPI: Devin Bullock is a 47 y.o. male who is not yet on dialysis. He has stage IV chronic kidney disease secondary to hypertension. He is right-handed. He denies any recent uremic symptoms. Specifically he denies nausea, vomiting, fatigue, anorexia, or palpitations.   Past Medical History  Diagnosis Date  . Enlarged heart   . Hypertension   . Morbid obesity   . Chronic kidney disease    Family History  Problem Relation Age of Onset  . Diabetes Mother   . Hypertension Mother   . Stroke Father   . Hypertension Father   . Depression Father    SOCIAL HISTORY: History  Substance Use Topics  . Smoking status: Never Smoker   . Smokeless tobacco: Never Used  . Alcohol Use: No   No Known Allergies Current Outpatient Prescriptions  Medication Sig Dispense Refill  . amLODipine (NORVASC) 10 MG tablet Take 1 tablet (10 mg total) by mouth at bedtime.  30 tablet  2  . furosemide (LASIX) 80 MG tablet Take 2 tablets (160 mg total) by mouth 2 (two) times daily.  120 tablet  0  . hydrALAZINE (APRESOLINE) 25 MG tablet Take 1 tablet (25 mg total) by mouth every 8 (eight) hours.  90 tablet  2  . isosorbide mononitrate (IMDUR) 30 MG 24 hr tablet Take 1 tablet (30 mg total) by mouth daily.  30 tablet  2  . labetalol (NORMODYNE) 300 MG tablet Take 1 tablet (300 mg total) by mouth 2 (two) times daily.  60 tablet  2   No current facility-administered medications for this visit.   REVIEW OF SYSTEMS: Valu.Nieves ] denotes positive finding; [  ] denotes negative finding  CARDIOVASCULAR:  [ ]  chest pain   [ ]  chest pressure   [ ]  palpitations   [ ]  orthopnea   [ ]  dyspnea on exertion   [ ]  claudication   [ ]  rest pain   [ ]  DVT   [ ]  phlebitis PULMONARY:   [ ]  productive cough   [ ]  asthma   [ ]  wheezing NEUROLOGIC:   [ ]   weakness  [ ]  paresthesias  [ ]  aphasia  [ ]  amaurosis  [ ]  dizziness HEMATOLOGIC:   [ ]  bleeding problems   [ ]  clotting disorders MUSCULOSKELETAL:  [ ]  joint pain   [ ]  joint swelling [ ]  leg swelling GASTROINTESTINAL: [ ]   blood in stool  [ ]   hematemesis GENITOURINARY:  [ ]   dysuria  [ ]   hematuria PSYCHIATRIC:  [ ]  history of major depression INTEGUMENTARY:  [ ]  rashes  [ ]  ulcers CONSTITUTIONAL:  [ ]  fever   [ ]  chills  PHYSICAL EXAM: Filed Vitals:   08/08/13 1122  BP: 170/109  Pulse: 71  Resp: 16  Height: 6\' 4"  (1.93 m)  Weight: 321 lb (145.605 kg)  SpO2: 95%   Body mass index is 39.09 kg/(m^2). GENERAL: The patient is a well-nourished male, in no acute distress. The vital signs are documented above. CARDIOVASCULAR: There is a regular rate and rhythm. I do not detect carotid bruits. He has palpable radial pulses bilaterally.  PULMONARY: There is good air exchange bilaterally without wheezing or rales. ABDOMEN: Soft and non-tender with normal pitched bowel sounds.  MUSCULOSKELETAL: There are no major deformities or cyanosis. NEUROLOGIC: No focal  weakness or paresthesias are detected. SKIN: There are no ulcers or rashes noted. PSYCHIATRIC: The patient has a normal affect.  DATA:  I have independently interpreted his arterial Doppler study. This shows that the radial artery comes off the axillary artery on the right. He does have triphasic Doppler signals in the radial and ulnar positions on the right. On the left side he has a triphasic radial and ulnar signal at the wrist. The radial artery diameter is 0.31 cm. The brachial artery diameter on the left is 0.67 cm.  I have also independently interpreted his duplex of his vein map which shows that he has a very reasonable forearm and upper arm cephalic vein on the left.  MEDICAL ISSUES:  End stage renal disease The patient appears to be a good candidate for a left radiocephalic AV fistula. I have explained the indications for  placement of an AV fistula or AV graft. I've explained that if at all possible we will place an AV fistula.  I have reviewed the risks of placement of an AV fistula including but not limited to: failure of the fistula to mature, need for subsequent interventions, and thrombosis.  All the patient's questions were answered and they are agreeable to proceed with surgery. His surgery has been scheduled for 08/24/2013.    [facility] [place] Beeper: [phone]

## 2013-08-08 NOTE — Assessment & Plan Note (Signed)
The patient appears to be a good candidate for a left radiocephalic AV fistula. I have explained the indications for placement of an AV fistula or AV graft. I've explained that if at all possible we will place an AV fistula.  I have reviewed the risks of placement of an AV fistula including but not limited to: failure of the fistula to mature, need for subsequent interventions, and thrombosis.  All the patient's questions were answered and they are agreeable to proceed with surgery. His surgery has been scheduled for 08/24/2013.

## 2013-08-13 ENCOUNTER — Encounter (HOSPITAL_COMMUNITY): Payer: Self-pay

## 2013-08-21 ENCOUNTER — Other Ambulatory Visit: Payer: Self-pay

## 2013-09-06 ENCOUNTER — Encounter (HOSPITAL_COMMUNITY): Payer: Self-pay | Admitting: Vascular Surgery

## 2013-09-06 ENCOUNTER — Inpatient Hospital Stay (HOSPITAL_COMMUNITY)
Admission: RE | Admit: 2013-09-06 | Discharge: 2013-09-06 | Disposition: A | Discharge status: Home / Self Care | Payer: Self-pay | Source: Ambulatory Visit

## 2013-09-06 NOTE — Progress Notes (Signed)
Pt. did not show up for pre-admit visit . I called pt and he stated he was out of town in [place], visiting his sick brother and stated he needs to cancel his surgery. I gave  him Dr. Nicole Cella office number and told him he needed to call the office. Pt. Stated he would do so.  I called Dr. Nicole Cella office and left a message with his scheduler  Concerning what the pt. Told me.

## 2013-09-11 ENCOUNTER — Ambulatory Visit (HOSPITAL_COMMUNITY): Admission: RE | Admit: 2013-09-11 | Payer: Self-pay | Source: Ambulatory Visit | Admitting: Vascular Surgery

## 2013-09-11 ENCOUNTER — Encounter (HOSPITAL_COMMUNITY): Admission: RE | Payer: Self-pay | Source: Ambulatory Visit

## 2013-09-11 SURGERY — ARTERIOVENOUS (AV) FISTULA CREATION
Anesthesia: Monitor Anesthesia Care | Site: Arm Lower | Laterality: Left

## 2013-10-11 ENCOUNTER — Other Ambulatory Visit: Payer: Self-pay | Admitting: *Deleted

## 2013-10-12 ENCOUNTER — Encounter (HOSPITAL_COMMUNITY): Payer: Self-pay | Admitting: Pharmacy Technician

## 2013-10-18 ENCOUNTER — Encounter (HOSPITAL_COMMUNITY): Payer: Self-pay | Admitting: Vascular Surgery

## 2013-10-18 ENCOUNTER — Inpatient Hospital Stay (HOSPITAL_COMMUNITY)
Admission: RE | Admit: 2013-10-18 | Discharge: 2013-10-18 | Disposition: A | Discharge status: Home / Self Care | Payer: Self-pay | Source: Ambulatory Visit

## 2013-10-18 MED ORDER — CEFUROXIME SODIUM 1.5 G IJ SOLR
1.5000 g | INTRAMUSCULAR | Status: DC
  Filled 2013-10-18: qty 1.5

## 2013-10-18 NOTE — Progress Notes (Signed)
Anesthesia Note:  Patient is a 48 year old male scheduled for creation of a LUE AVF tomorrow by Dr. Scot Dock.  His PAT appointment was scheduled for 2 PM today, but he was a no show and stated he was not ready to proceed with surgery yet.  Kay at [facility] is aware, and he is currently rescheduled for 11/02/13.   History includes CKD stage IV not yet on hemodialysis, non-smoker, dilated cardiomyopathy (severely dilated LV cavity/LVH with EF 45-50% 03/2013), combined systolic and diastolic CHF with last hospitalization for exacerbation with hypertensive emergency with mild elevation in troponin (0.38-0.42) felt to be related to demand ischemia 03/2013, HTN, morbid obesity. Cardiologist is Dr. Doylene Canard. Nephrologist is Dr. [name].  EKG on 04/14/13 showed ST @ 105 bpm, LAE, consider biatrial enlargement, abnormal r wave progression--early transition, LVH with secondary repolarization abnormality (versus consider lateral ischemia), borderline prolonged QT interval. Lateral T wave abnormality is more pronounced since 01/09/09.   Echo on 04/13/13 showed: - Left ventricle: The cavity size was severely dilated. Wall thickness was increased in a pattern of severe LVH. Systolic function was mildly reduced. The estimated ejection fraction was in the range of 45% to 50%. Wall motion was normal; there were no regional wall motion abnormalities. There was fusion of early and atrial contributions to ventricular filling. - Aortic valve: Trivial regurgitation. - Mitral valve: Mild regurgitation. - Left atrium: The atrium was severely dilated. - Right atrium: The atrium was moderately dilated. - Pericardium, extracardiac: A moderate, free-flowing pericardial effusion was identified circumferential to the heart. There was no evidence of hemodynamic compromise. Notes from 03/2013 indicate that catheterization as been avoided due to his severe renal dysfunction.  CXR on 04/14/13 showed: FINDINGS: Cardiomegaly with vascular congestion.  No overt edema. No focal  opacities or effusions. No acute bony abnormality. IMPRESSION: Cardiomegaly, vascular congestion.   He is scheduled for ISTAT on the day of surgery.  I reviewed above with anesthesiologist Dr. Conrad Menifee.  Recommend pre-operative cardiac clearance by Dr. Doylene Canard.  Kay at [facility] notified.  George Hugh [facility] Short Stay Center/Anesthesiology Phone [phone] 10/18/2013 3:03 PM

## 2013-10-18 NOTE — Progress Notes (Signed)
Patient called; no show for PAT 10/18/2013.Pt states he want to postpone surgery. Dr Nicole Cella office nurse  notified.

## 2013-10-29 ENCOUNTER — Telehealth: Payer: Self-pay | Admitting: Surgery

## 2013-10-29 NOTE — Progress Notes (Signed)
Anesthesia follow-up:  See my note from 10/18/13.  Patient had an appointment to see Dr. Doylene Canard today, but said he did not have the money.  I called and spoke with him at length today.  He seemed to not be aware that he had even seen a cardiologist during his 03/2013 hospitalization.  I did try to explain some of the effects of long term HTN had on his heart.  He wasn't really aware of why he had been evaluated by Dr. Doylene Canard  Further more, he ran out of several of his medications three days ago (including amlodipine, Imdur, and labetalol). He says he does not have the money to get his medication refilled. He still does not have a PCP. He is still seeing Dr. Jeneen Rinks Deterding.  He has made several lifestyle changes including cutting out meat and fried foods.  He is fast walking for 30 minutes on a treadmill 2-3 times at weeks, at times on an incline.  He has no chest pain.  He denies SOB, but does describe a feeling like he needs to clear his throat when lying down.  He denies LE edema.  This is a difficult situation.  He has no PCP and is or has run out of several of his medications.  He has no insurance and says he has no money. He has no acute CV/CHF symptoms and reports a fairly good exercise tolerance of ability to ambulate for 30 minutes with periodic incline.  Prior notes indicate that cath was not being considered at this point due to his CKD.  I told him that I was concerned that he would end up back in the ED since he is off of his medications.  I contacted [place] [phone]).  She and/or the case manager were going to contact patient regarding getting his established with a PCP and hopefully a way to get his medications.  He has since been scheduled for an appointment with [facility] on 11/01/13 at 4:15 PM.  I have reviewed with anesthesiologist Dr. Linna Caprice.  He is going to speak further with Dr. Scot Dock to help determine the next step--help determine if patient will  need to see just PCP preoperatively or will also need to a cardiologist visit and also to discuss concerns of uncontrolled HTN particularly since he jsut ran out of his medications. Would like to avoid a situation in which he needs hemodialysis access emergently, but if his HTN is not controlled he would likely only be a candidate for Diatek.     George Hugh [facility] Short Stay Center/Anesthesiology Phone [phone] 10/29/2013 6:32 PM

## 2013-10-30 ENCOUNTER — Telehealth: Payer: Self-pay

## 2013-10-30 NOTE — Telephone Encounter (Signed)
Spoke with patient to schedule appointment, dpm °

## 2013-10-30 NOTE — Telephone Encounter (Signed)
Rec'd call from Dr. Scot Dock.  He spoke with Dr. Linna Caprice, anesthesia.  Due to elevated BP, advised to cancel pt's surgery for left AVF creation on 11/02/13.  Also advised to schedule pt. office appt. in future to discuss rescheduling surgery.  Notified pt. of need to cancel surgery per Dr. Scot Dock and per anesthesia's recommendation.  Pt. verb. understanding.  Stated he didn't go to recent Cardiology appt. with Dr. Daine Floras, due to not having any insurance.   Stated he has appt. tomorrow with the [place].  Advised pt. that Dr. Scot Dock would like him to schedule appt. in our office to discuss and reschedule surgery.  Advised will have a scheduler call him.  Verb. Understanding.

## 2013-10-31 NOTE — Progress Notes (Signed)
10/31/2013 Gorman RN BSN [phone] Received incoming call from patient verifying his appt time. Discussed the importance of keeping his appt patient verbalized understanding.  10/29/2013 1703 W. Stann Mainland. RN BSN [phone] ED CM contacted patient with appt at [place] 3/19 @4 :15. Pt verbalized understanding teach back done. Provided my contact information if any further question or concerns arises.    10/29/2013 13:45 W. Stann Mainland RN BSN [phone] ED CM received message from [place], she was contacted by Myra Gianotti, PA-C [facility] Short Stay Center/Anesthesiology  Phone [phone]  to contact patient concerning medication assistance. ED CM contacted patient regarding medications asisstance. Pt states, that he has run out of meds and  and is unable for to afford them. Pt does not have insurance. Patient was questioned  if he was seen in the ED or recently discharged from [facility]. Pt explained that he was admitted to [facility] 04/14/13 withwith CKD stage IV not yet on hemodialysis Creat 5.2 BUN 53 on 04/17/13, non-smoker, dilated cardiomyopathy (severely dilated LV cavity/LVH with EF 45-50% 03/2013), combined systolic and diastolic CHF with last hospitalization for exacerbation with hypertensive emergency with mild elevation in troponin (0.38-0.42). He was discharged to follow up with Dr. Joyice Faster Deterding, Neprhrologist and Dixie Dials MD  Cardiologist  Which, he states he had but, was  no longer able to afford. He however was not follow up by a PCP.  Pt reports that he was scheduled to have surgery for AV fistula 11/02/13  but was cancelled due to patient not having medical clearance as per patient. Pt does not have a PCP at this time. Discussed [place] with patient. Pt interested in establishing care. Offered to schedule appt., received verbal consent from patient. Verified phone number as [phone]. ED CM will contact patient with appt date and time.Marland Kitchen

## 2013-11-01 ENCOUNTER — Ambulatory Visit: Payer: Managed Care, Other (non HMO) | Attending: Internal Medicine | Admitting: Internal Medicine

## 2013-11-01 VITALS — BP 183/103 | HR 84 | Temp 98.9°F | Resp 16 | Ht 76.0 in | Wt 317.0 lb

## 2013-11-01 DIAGNOSIS — I10 Essential (primary) hypertension: Secondary | ICD-10-CM

## 2013-11-01 DIAGNOSIS — I129 Hypertensive chronic kidney disease with stage 1 through stage 4 chronic kidney disease, or unspecified chronic kidney disease: Secondary | ICD-10-CM | POA: Insufficient documentation

## 2013-11-01 DIAGNOSIS — N186 End stage renal disease: Secondary | ICD-10-CM

## 2013-11-01 DIAGNOSIS — N184 Chronic kidney disease, stage 4 (severe): Secondary | ICD-10-CM | POA: Insufficient documentation

## 2013-11-01 DIAGNOSIS — I420 Dilated cardiomyopathy: Secondary | ICD-10-CM

## 2013-11-01 DIAGNOSIS — I517 Cardiomegaly: Secondary | ICD-10-CM | POA: Insufficient documentation

## 2013-11-01 DIAGNOSIS — I428 Other cardiomyopathies: Secondary | ICD-10-CM

## 2013-11-01 DIAGNOSIS — I509 Heart failure, unspecified: Secondary | ICD-10-CM | POA: Insufficient documentation

## 2013-11-01 DIAGNOSIS — I5042 Chronic combined systolic (congestive) and diastolic (congestive) heart failure: Secondary | ICD-10-CM | POA: Insufficient documentation

## 2013-11-01 LAB — CBC WITH DIFFERENTIAL/PLATELET
Basophils Absolute: 0 10*3/uL (ref 0.0–0.1)
Basophils Relative: 1 % (ref 0–1)
Eosinophils Absolute: 0.4 10*3/uL (ref 0.0–0.7)
Eosinophils Relative: 9 % — ABNORMAL HIGH (ref 0–5)
HEMATOCRIT: 38.3 % — AB (ref 39.0–52.0)
HEMOGLOBIN: 13.1 g/dL (ref 13.0–17.0)
LYMPHS PCT: 25 % (ref 12–46)
Lymphs Abs: 1 10*3/uL (ref 0.7–4.0)
MCH: 28.7 pg (ref 26.0–34.0)
MCHC: 34.2 g/dL (ref 30.0–36.0)
MCV: 84 fL (ref 78.0–100.0)
MONO ABS: 0.4 10*3/uL (ref 0.1–1.0)
MONOS PCT: 11 % (ref 3–12)
Neutro Abs: 2.2 10*3/uL (ref 1.7–7.7)
Neutrophils Relative %: 54 % (ref 43–77)
Platelets: 175 10*3/uL (ref 150–400)
RBC: 4.56 MIL/uL (ref 4.22–5.81)
RDW: 13.9 % (ref 11.5–15.5)
WBC: 4 10*3/uL (ref 4.0–10.5)

## 2013-11-01 LAB — POCT GLYCOSYLATED HEMOGLOBIN (HGB A1C): Hemoglobin A1C: 5.5

## 2013-11-01 MED ORDER — FUROSEMIDE 80 MG PO TABS: 80.0000 mg | ORAL_TABLET | Freq: Two times a day (BID) | ORAL | Status: DC

## 2013-11-01 MED ORDER — LABETALOL HCL 300 MG PO TABS: 300.0000 mg | ORAL_TABLET | Freq: Three times a day (TID) | ORAL | Status: DC

## 2013-11-01 MED ORDER — CALCITRIOL 0.25 MCG PO CAPS: 0.2500 ug | ORAL_CAPSULE | Freq: Every day | ORAL | Status: DC

## 2013-11-01 MED ORDER — AMLODIPINE BESYLATE 10 MG PO TABS: 10.0000 mg | ORAL_TABLET | Freq: Every day | ORAL | Status: DC

## 2013-11-01 MED ORDER — ISOSORBIDE MONONITRATE ER 30 MG PO TB24: 30.0000 mg | ORAL_TABLET | Freq: Every day | ORAL | Status: DC

## 2013-11-01 NOTE — Progress Notes (Signed)
Pt is here to establish care. Pt has a history of renal disease.

## 2013-11-01 NOTE — Progress Notes (Signed)
Patient ID: Devin Bullock, male   DOB: June 07, 1966, 48 y.o.   MRN: [id number]   CC:  HPI:  48 year old male, with a history of CKD stage IV not yet on hemodialysis, non-smoker, dilated cardiomyopathy (severely dilated LV cavity/LVH with EF 45-50% 03/2013), combined systolic and diastolic CHF with last hospitalization for exacerbation with hypertensive emergency with mild elevation in troponin (0.38-0.42) felt to be related to demand ischemia 03/2013, HTN, morbid obesity. Cardiologist is Dr. Doylene Canard. Nephrologist is Dr. [name]. Unable to see cardiology: Dr. Doylene Canard, because of insurance issues. Surgery canceled twice, because of inability to get cardiology crew clearance prior to surgery. Patient denies any chest pain shortness of breath dependent edema. Has not taken any of his medications for the last 3 or 4 days except Lasix Blood pressure elevated to greater than 190. Received 0.2 of clonidine by mouth today. States that he is fairly active and exercising 30 minutes every day and less heavy weights. Denies any chest pain or shortness of breath which is ongoing.  Social history nonsmoker nonalcoholic  Family history mother and father deceased, mother had depression father had stroke at the age of 78   EKG on 04/14/13 showed ST @ 105 bpm, LAE, consider biatrial enlargement, abnormal r wave progression--early transition, LVH with secondary repolarization abnormality (versus consider lateral ischemia), borderline prolonged QT interval. Lateral T wave abnormality is more pronounced since 2009/01/16.  Echo on 04/13/13 showed:  - Left ventricle: The cavity size was severely dilated. Wall thickness was increased in a pattern of severe LVH. Systolic function was mildly reduced. The estimated ejection fraction was in the range of 45% to 50%. Wall motion was normal; there were no regional wall motion abnormalities. There was fusion of early and atrial contributions to ventricular filling. - Left atrium: The atrium  was severely dilated. - Right atrium: The atrium was moderately dilated. - Pericardium, extracardiac: A moderate, free-flowing pericardial effusion was identified circumferential to the heart. There was no evidence of hemodynamic compromise. Notes from 03/2013 indicate that catheterization as been avoided due to his severe renal dysfunction.  CXR on 04/14/13 showed: FINDINGS: Cardiomegaly with vascular congestion. No overt edema. No focal opacities or effusions. No acute bony abnormality. IMPRESSION: Cardiomegaly, vascular congestion.      No Known Allergies Past Medical History  Diagnosis Date  . Enlarged heart   . Hypertension   . Morbid obesity   . Chronic kidney disease    No current outpatient prescriptions on file prior to visit.   No current facility-administered medications on file prior to visit.   Family History  Problem Relation Age of Onset  . Diabetes Mother   . Hypertension Mother   . Stroke Father   . Hypertension Father   . Depression Father    History   Social History  . Marital Status: Single    Spouse Name: N/A    Number of Children: N/A  . Years of Education: N/A   Occupational History  . Not on file.   Social History Main Topics  . Smoking status: Never Smoker   . Smokeless tobacco: Never Used  . Alcohol Use: No  . Drug Use: No  . Sexual Activity: No   Other Topics Concern  . Not on file   Social History Narrative  . No narrative on file    Review of Systems  Constitutional: Negative for fever, chills, diaphoresis, activity change, appetite change and fatigue.  HENT: Negative for ear pain, nosebleeds, congestion, facial swelling, rhinorrhea, neck pain, neck  stiffness and ear discharge.   Eyes: Negative for pain, discharge, redness, itching and visual disturbance.  Respiratory: Negative for cough, choking, chest tightness, shortness of breath, wheezing and stridor.   Cardiovascular: Negative for chest pain, palpitations and leg swelling.   Gastrointestinal: Negative for abdominal distention.  Genitourinary: Negative for dysuria, urgency, frequency, hematuria, flank pain, decreased urine volume, difficulty urinating and dyspareunia.  Musculoskeletal: Negative for back pain, joint swelling, arthralgias and gait problem.  Neurological: Negative for dizziness, tremors, seizures, syncope, facial asymmetry, speech difficulty, weakness, light-headedness, numbness and headaches.  Hematological: Negative for adenopathy. Does not bruise/bleed easily.  Psychiatric/Behavioral: Negative for hallucinations, behavioral problems, confusion, dysphoric mood, decreased concentration and agitation.    Objective:   Filed Vitals:   11/01/13 1607  BP: 190/119  Pulse: 84  Temp: 98.9 F (37.2 C)  Resp: 16    Physical Exam  Constitutional: Appears well-developed and well-nourished. No distress.  HENT: Normocephalic. External right and left ear normal. Oropharynx is clear and moist.  Eyes: Conjunctivae and EOM are normal. PERRLA, no scleral icterus.  Neck: Normal ROM. Neck supple. No JVD. No tracheal deviation. No thyromegaly.  CVS: RRR, S1/S2 +, no murmurs, no gallops, no carotid bruit.  Pulmonary: Effort and breath sounds normal, no stridor, rhonchi, wheezes, rales.  Abdominal: Soft. BS +,  no distension, tenderness, rebound or guarding.  Musculoskeletal: Normal range of motion. No edema and no tenderness.  Lymphadenopathy: No lymphadenopathy noted, cervical, inguinal. Neuro: Alert. Normal reflexes, muscle tone coordination. No cranial nerve deficit. Skin: Skin is warm and dry. No rash noted. Not diaphoretic. No erythema. No pallor.  Psychiatric: Normal mood and affect. Behavior, judgment, thought content normal.   Lab Results  Component Value Date   WBC 4.5 04/17/2013   HGB 12.4* 04/17/2013   HCT 36.6* 04/17/2013   MCV 86.5 04/17/2013   PLT 141* 04/17/2013   Lab Results  Component Value Date   CREATININE 5.02* 04/17/2013   BUN 53* 04/17/2013    NA 138 04/17/2013   K 3.3* 04/17/2013   CL 97 04/17/2013   CO2 31 04/17/2013    Lab Results  Component Value Date   HGBA1C 6.0* 04/15/2013   Lipid Panel  No results found for this basename: chol, trig, hdl, cholhdl, vldl, ldlcalc       Assessment and plan:   Patient Active Problem List   Diagnosis Date Noted  . End stage renal disease 08/08/2013  . Chronic kidney disease (CKD), stage IV (severe) 04/17/2013  . Patient nonadherence 04/17/2013  . Morbid obesity 04/15/2013  . Anemia 04/15/2013  . Cardiomegaly 04/13/2013  . Hypertensive emergency 04/13/2013  . Acute on chronic combined systolic and diastolic CHF (congestive heart failure) 04/13/2013   Accelerated hypertension Given one dose of clonidine We'll recheck blood pressure prior to discharge Increase labetalol to 300, 3 times a day Continue Imdur, Norvasc  Stage IV-V chronic kidney disease Patient scheduled for left AVF creation on 11/02/13 Will try to expedite cardiology referral for clearance, referral provided    Establish care Obtain baseline labs Patient followup in 6 weeks       The patient was given clear instructions to go to ER or return to medical center if symptoms don't improve, worsen or new problems develop. The patient verbalized understanding. The patient was told to call to get any lab results if not heard anything in the next week.

## 2013-11-02 ENCOUNTER — Encounter (HOSPITAL_COMMUNITY): Admission: RE | Payer: Self-pay | Source: Ambulatory Visit

## 2013-11-02 ENCOUNTER — Ambulatory Visit (HOSPITAL_COMMUNITY)
Admission: RE | Admit: 2013-11-02 | Payer: Managed Care, Other (non HMO) | Source: Ambulatory Visit | Admitting: Vascular Surgery

## 2013-11-02 LAB — COMPLETE METABOLIC PANEL WITH GFR
ALT: 16 U/L (ref 0–53)
AST: 27 U/L (ref 0–37)
Albumin: 4.2 g/dL (ref 3.5–5.2)
Alkaline Phosphatase: 99 U/L (ref 39–117)
BUN: 33 mg/dL — ABNORMAL HIGH (ref 6–23)
CO2: 26 mEq/L (ref 19–32)
CREATININE: 3.88 mg/dL — AB (ref 0.50–1.35)
Calcium: 8.8 mg/dL (ref 8.4–10.5)
Chloride: 101 mEq/L (ref 96–112)
GFR, Est African American: 20 mL/min — ABNORMAL LOW
GFR, Est Non African American: 17 mL/min — ABNORMAL LOW
Glucose, Bld: 99 mg/dL (ref 70–99)
POTASSIUM: 3.3 meq/L — AB (ref 3.5–5.3)
Sodium: 137 mEq/L (ref 135–145)
Total Bilirubin: 0.5 mg/dL (ref 0.2–1.2)
Total Protein: 7.1 g/dL (ref 6.0–8.3)

## 2013-11-02 LAB — LIPID PANEL
Cholesterol: 184 mg/dL (ref 0–200)
HDL: 40 mg/dL (ref >39)
LDL CALC: 121 mg/dL — AB (ref 0–99)
Total CHOL/HDL Ratio: 4.6 Ratio
Triglycerides: 115 mg/dL (ref <150)
VLDL: 23 mg/dL (ref 0–40)

## 2013-11-02 LAB — TSH: TSH: 1.471 u[IU]/mL (ref 0.350–4.500)

## 2013-11-02 LAB — VITAMIN D 25 HYDROXY (VIT D DEFICIENCY, FRACTURES): VIT D 25 HYDROXY: 12 ng/mL — AB (ref 30–89)

## 2013-11-02 SURGERY — ARTERIOVENOUS (AV) FISTULA CREATION
Anesthesia: Monitor Anesthesia Care | Laterality: Left

## 2013-11-06 ENCOUNTER — Ambulatory Visit: Payer: Self-pay | Admitting: Pharmacist

## 2013-11-06 ENCOUNTER — Encounter: Payer: Self-pay | Admitting: Vascular Surgery

## 2013-11-07 ENCOUNTER — Other Ambulatory Visit: Payer: Self-pay | Admitting: *Deleted

## 2013-11-07 ENCOUNTER — Ambulatory Visit (INDEPENDENT_AMBULATORY_CARE_PROVIDER_SITE_OTHER): Payer: Self-pay | Admitting: Vascular Surgery

## 2013-11-07 ENCOUNTER — Encounter: Payer: Self-pay | Admitting: Vascular Surgery

## 2013-11-07 VITALS — BP 124/81 | HR 73 | Ht 76.0 in | Wt 317.0 lb

## 2013-11-07 DIAGNOSIS — N184 Chronic kidney disease, stage 4 (severe): Secondary | ICD-10-CM

## 2013-11-07 DIAGNOSIS — N186 End stage renal disease: Secondary | ICD-10-CM

## 2013-11-07 NOTE — Assessment & Plan Note (Signed)
The patient's blood pressure is now under good control. The patient appears to be a good candidate for a left radiocephalic AV fistula. I have explained the indications for placement of an AV fistula or AV graft. I've explained that if at all possible we will place an AV fistula. I have reviewed the risks of placement of an AV fistula including but not limited to: failure of the fistula to mature, need for subsequent interventions, and thrombosis. All the patient's questions were answered and they are agreeable to proceed with surgery. His surgery has been scheduled for  12/06/13. I will be unavailable for the next 2 weeks and the patient states that he wanted to wait until I was available for surgery. Otherwise we would try to schedule this sooner.

## 2013-11-07 NOTE — Progress Notes (Signed)
   Patient name: Devin Bullock MRN: [id number] DOB: Jan 08, 1966 Sex: male  REASON FOR VISIT: To reschedule surgery.  HPI: Devin Bullock is a 48 y.o. male who had been scheduled for placement of hemodialysis access but this had to be canceled as anesthesia was concerned about his very poorly controlled blood pressure. He is still not on dialysis. He tells me that he could not afford his medications and therefore had stopped taking them. He is now back on his medications and states his blood pressure is well-controlled.  He denies any recent uremic symptoms. He denies nausea, vomiting, fatigue, anorexia, or palpitations.  REVIEW OF SYSTEMS: Valu.Nieves ] denotes positive finding; [  ] denotes negative finding  CARDIOVASCULAR:  [ ]  chest pain   [ ]  dyspnea on exertion    CONSTITUTIONAL:  [ ]  fever   [ ]  chills  PHYSICAL EXAM: Filed Vitals:   11/07/13 1100  BP: 124/81  Pulse: 73  Height: 6\' 4"  (1.93 m)  Weight: 317 lb (143.79 kg)  SpO2: 100%   Body mass index is 38.6 kg/(m^2). GENERAL: The patient is a well-nourished male, in no acute distress. The vital signs are documented above. CARDIOVASCULAR: There is a regular rate and rhythm. PULMONARY: There is good air exchange bilaterally without wheezing or rales. He has palpable radial pulses bilaterally.  I have independently interpreted his arterial Doppler study. This shows that the radial artery comes off the axillary artery on the right. He does have triphasic Doppler signals in the radial and ulnar positions on the right. On the left side he has a triphasic radial and ulnar signal at the wrist. The radial artery diameter is 0.31 cm. The brachial artery diameter on the left is 0.67 cm.   I have also independently interpreted his duplex of his vein map which shows that he has a very reasonable forearm and upper arm cephalic vein on the left.   MEDICAL ISSUES:  Chronic kidney disease (CKD), stage IV (severe) The patient's blood pressure is now under good  control. The patient appears to be a good candidate for a left radiocephalic AV fistula. I have explained the indications for placement of an AV fistula or AV graft. I've explained that if at all possible we will place an AV fistula. I have reviewed the risks of placement of an AV fistula including but not limited to: failure of the fistula to mature, need for subsequent interventions, and thrombosis. All the patient's questions were answered and they are agreeable to proceed with surgery. His surgery has been scheduled for  12/06/13. I will be unavailable for the next 2 weeks and the patient states that he wanted to wait until I was available for surgery. Otherwise we would try to schedule this sooner.   [facility]: [phone]

## 2013-11-08 ENCOUNTER — Encounter: Payer: Self-pay | Admitting: *Deleted

## 2013-11-12 ENCOUNTER — Telehealth: Payer: Self-pay | Admitting: Emergency Medicine

## 2013-11-12 NOTE — Telephone Encounter (Signed)
Message copied by Ricci Barker on Mon Nov 12, 2013  3:21 PM ------      Message from: Allyson Sabal MD, Ascencion Dike      Created: Wed Nov 07, 2013 11:56 AM       Notify patient of the patient's labs, particularly his kidney function is stable. He has history of chronic kidney disease and is followed by [facility]. The patient's vitamin D level is 12. He needs further workup including a phosphorus level, to be done by his nephrologist. Accordingly adjust his medication. At this time we will not give him a prescription for vitamin D. He should call his nephrologist for a followup within the next 2-3 months. ------

## 2013-11-12 NOTE — Telephone Encounter (Signed)
Message copied by Ricci Barker on Mon Nov 12, 2013  3:15 PM ------      Message from: Allyson Sabal MD, Ascencion Dike      Created: Wed Nov 07, 2013 11:56 AM       Notify patient of the patient's labs, particularly his kidney function is stable. He has history of chronic kidney disease and is followed by [facility]. The patient's vitamin D level is 12. He needs further workup including a phosphorus level, to be done by his nephrologist. Accordingly adjust his medication. At this time we will not give him a prescription for vitamin D. He should call his nephrologist for a followup within the next 2-3 months. ------

## 2013-11-12 NOTE — Telephone Encounter (Signed)
Pt given results and instructions to call kidney specialist Dr. Deterdine for phosphorus level and further w/u. Verbalized understanding

## 2013-11-23 ENCOUNTER — Encounter (HOSPITAL_COMMUNITY): Payer: Self-pay | Admitting: Pharmacy Technician

## 2013-11-27 ENCOUNTER — Encounter (HOSPITAL_COMMUNITY): Payer: Self-pay | Admitting: *Deleted

## 2013-11-27 ENCOUNTER — Ambulatory Visit (INDEPENDENT_AMBULATORY_CARE_PROVIDER_SITE_OTHER): Payer: Self-pay | Admitting: Cardiology

## 2013-11-27 ENCOUNTER — Encounter: Payer: Self-pay | Admitting: Cardiology

## 2013-11-27 VITALS — BP 160/100 | HR 67 | Ht 76.0 in | Wt 309.0 lb

## 2013-11-27 DIAGNOSIS — I517 Cardiomegaly: Secondary | ICD-10-CM

## 2013-11-27 DIAGNOSIS — I5043 Acute on chronic combined systolic (congestive) and diastolic (congestive) heart failure: Secondary | ICD-10-CM

## 2013-11-27 DIAGNOSIS — I509 Heart failure, unspecified: Secondary | ICD-10-CM

## 2013-11-27 NOTE — Patient Instructions (Addendum)
The current medical regimen is effective;  continue present plan and medications.  Your physician has requested that you have a lexiscan myoview. For further information please visit [email or web address]. Please follow instruction sheet, as given.  Further follow up will be based on these results.

## 2013-11-27 NOTE — Progress Notes (Signed)
HPI The patient presents for preoperative clearance. He is to have a dialysis graft fistula placed. He has renal failure apparently from long-standing hypertension. I do note a cardiac workup in the past when he was seen by another cardiology practice in a hospital in August of last year. He presented with a hypertensive urgency. He was found to have systolic and diastolic heart failure acutely with a minimally elevated troponin. His EF was about 50% with severe LVH. He had left atrial enlargement. He did not have any prior cardiac workup. He denies any ongoing symptoms since that time. He said he was not taking medications and was not taking care of himself. Since then he has lost about 30 pounds. He is exercising. His blood pressure is better controlled. He denies any chest pressure, neck or arm discomfort. He denies any palpitations, presyncope or syncope. He's had no PND or orthopnea. He did not followup as an outpatient with cardiology although this was planned or suggested.  No Known Allergies  Current Outpatient Prescriptions  Medication Sig Dispense Refill  . amLODipine (NORVASC) 10 MG tablet Take 1 tablet (10 mg total) by mouth at bedtime.  30 tablet  2  . calcitRIOL (ROCALTROL) 0.25 MCG capsule Take 1 capsule (0.25 mcg total) by mouth daily.  30 capsule  2  . furosemide (LASIX) 80 MG tablet Take 40 mg by mouth 2 (two) times daily.      . isosorbide mononitrate (IMDUR) 30 MG 24 hr tablet Take 1 tablet (30 mg total) by mouth daily.  30 tablet  2  . labetalol (NORMODYNE) 300 MG tablet Take 1 tablet (300 mg total) by mouth 3 (three) times daily.  90 tablet  3   No current facility-administered medications for this visit.    Past Medical History  Diagnosis Date  . Enlarged heart   . Hypertension   . Morbid obesity   . Chronic kidney disease     No past surgical history on file.  Family History  Problem Relation Age of Onset  . Diabetes Mother   . Hypertension Mother   . Heart  attack Mother   . Stroke Father   . Hypertension Father   . Depression Father   . Diabetes Brother   . Hypertension Brother     History   Social History  . Marital Status: Single    Spouse Name: N/A    Number of Children: N/A  . Years of Education: N/A   Occupational History  . Not on file.   Social History Main Topics  . Smoking status: Never Smoker   . Smokeless tobacco: Never Used  . Alcohol Use: No  . Drug Use: No  . Sexual Activity: No   Other Topics Concern  . Not on file   Social History Narrative  . No narrative on file    ROS:  As stated in the HPI and negative for all other systems.   PHYSICAL EXAM BP 160/100  Pulse 67  Ht 6\' 4"  (1.93 m)  Wt 309 lb (140.161 kg)  BMI 37.63 kg/m2 GENERAL:  Well appearing HEENT:  Pupils equal round and reactive, fundi not visualized, oral mucosa unremarkable NECK:  No jugular venous distention, waveform within normal limits, carotid upstroke brisk and symmetric, no bruits, no thyromegaly LYMPHATICS:  No cervical, inguinal adenopathy LUNGS:  Clear to auscultation bilaterally BACK:  No CVA tenderness CHEST:  Unremarkable HEART:  PMI not displaced or sustained,S1 and S2 within normal limits, no S3, no S4,  no clicks, no rubs, no murmurs ABD:  Flat, positive bowel sounds normal in frequency in pitch, no bruits, no rebound, no guarding, no midline pulsatile mass, no hepatomegaly, no splenomegaly EXT:  2 plus pulses throughout, no edema, no cyanosis no clubbing SKIN:  No rashes no nodules NEURO:  Cranial nerves II through XII grossly intact, motor grossly intact throughout PSYCH:  Cognitively intact, oriented to person place and time   EKG:   Sinus rhythm, rate 76,leftward axis, left ventricular hypertrophy with repolarization changes, early transition in lead V2, no ST-T wave changes. 11/27/2013  ASSESSMENT AND PLAN  PREOP:  Given the elevated enzyme previously and cardiomyopathy along with an abnormal EKG obstructive  coronary disease will need to be excluded. She will have a [employer].  HTN:  Blood pressure is elevated today but other readings have been better controlled. I will defer to his primary providers. For now he will continue the meds as listed.  SYSTOLIC AND DIASTOLIC HF:  As above we will exclude ischemia. I suspect this is hypertensive. He seems to be euvolemic. We did discuss salt reduction.

## 2013-12-04 ENCOUNTER — Telehealth (HOSPITAL_COMMUNITY): Payer: Self-pay

## 2013-12-05 ENCOUNTER — Encounter (HOSPITAL_COMMUNITY): Payer: Self-pay

## 2013-12-05 NOTE — Progress Notes (Signed)
Pt returned call. Started asking him questions and he interrupted me wanting to know why I was calling about surgery. He states that he has an appt this afternoon with the cardiologist for a stress test (for clearance) and was told he wouldn't be having the surgery until they had the results of the stress test. I told him that it was a possibility that the results would be final today and if everything was ok he could proceed with his surgery in the am. He then asked me what time the surgery was scheduled for and I told him 8:30 AM but that he needed to be here at 6:00 AM. He then said that wasn't going to work for him and wanted to know if there was another time. I told him he had to call Dr. Nicole Cella office to change or cancel the surgery. I gave him the number to Dr. Nicole Cella office. Pt will call and discuss with them.

## 2013-12-05 NOTE — Progress Notes (Signed)
Called Dr. Nicole Cella office to notify them that pt was to call them about changing time of surgery. Spoke with Arbie Cookey and she states she has not spoken with the pt, but he may have left a message and she hasn't seen it yet. She states that she had called Dr. Rosezella [place] office to see how soon they could get the stress test results and was told that the pt had called and cancelled the stress test because he had something else to do. I told her that when I spoke with the pt he told me he was going for the stress test today. She states she will contact pt.

## 2013-12-06 ENCOUNTER — Encounter (HOSPITAL_COMMUNITY): Admission: RE | Payer: Self-pay | Source: Ambulatory Visit

## 2013-12-06 ENCOUNTER — Ambulatory Visit (HOSPITAL_COMMUNITY): Admission: RE | Admit: 2013-12-06 | Payer: Self-pay | Source: Ambulatory Visit | Admitting: Vascular Surgery

## 2013-12-06 SURGERY — ARTERIOVENOUS (AV) FISTULA CREATION
Anesthesia: Monitor Anesthesia Care | Laterality: Left

## 2013-12-11 ENCOUNTER — Encounter (HOSPITAL_COMMUNITY): Payer: Self-pay

## 2013-12-13 ENCOUNTER — Telehealth: Payer: Self-pay

## 2013-12-13 NOTE — Telephone Encounter (Signed)
Attempted to contact pt. per phone.  Left voice message to advise that surgery for 12/18/13 for Left Arm AVF will be cancelled, due to pt. not completing the Nuclear Stress Test. (Stress test was scheduled for 4/23; then rescheduled to 4/28; no notes available to indicate pt. completed stress test)  Advised to call our office to reschedule procedure after stress test is done.

## 2013-12-21 ENCOUNTER — Ambulatory Visit: Payer: Self-pay | Admitting: Internal Medicine

## 2013-12-27 ENCOUNTER — Telehealth (HOSPITAL_COMMUNITY): Payer: Self-pay

## 2014-01-01 ENCOUNTER — Encounter (HOSPITAL_COMMUNITY): Payer: Self-pay

## 2014-01-03 ENCOUNTER — Telehealth: Payer: Self-pay | Admitting: *Deleted

## 2014-01-03 NOTE — Telephone Encounter (Signed)
Left a voicemail asking him to call Arbie Cookey to schedule his surgery.

## 2014-01-09 NOTE — Telephone Encounter (Signed)
Encounter opened in error

## 2014-01-11 ENCOUNTER — Encounter: Payer: Self-pay | Admitting: Internal Medicine

## 2014-01-11 ENCOUNTER — Ambulatory Visit: Payer: Self-pay | Attending: Internal Medicine | Admitting: Internal Medicine

## 2014-01-11 VITALS — BP 150/94 | HR 77 | Temp 98.7°F | Resp 16 | Ht 76.0 in | Wt 314.0 lb

## 2014-01-11 DIAGNOSIS — I5043 Acute on chronic combined systolic (congestive) and diastolic (congestive) heart failure: Secondary | ICD-10-CM | POA: Insufficient documentation

## 2014-01-11 DIAGNOSIS — I517 Cardiomegaly: Secondary | ICD-10-CM | POA: Insufficient documentation

## 2014-01-11 DIAGNOSIS — I10 Essential (primary) hypertension: Secondary | ICD-10-CM

## 2014-01-11 DIAGNOSIS — I509 Heart failure, unspecified: Secondary | ICD-10-CM | POA: Insufficient documentation

## 2014-01-11 DIAGNOSIS — N184 Chronic kidney disease, stage 4 (severe): Secondary | ICD-10-CM | POA: Insufficient documentation

## 2014-01-11 DIAGNOSIS — I129 Hypertensive chronic kidney disease with stage 1 through stage 4 chronic kidney disease, or unspecified chronic kidney disease: Secondary | ICD-10-CM | POA: Insufficient documentation

## 2014-01-11 NOTE — Progress Notes (Signed)
Pt is here following up on his HTN and heart failure.  Pt has no C/O today.

## 2014-01-11 NOTE — Progress Notes (Signed)
Patient ID: Devin Bullock, male   DOB: March 24, 1966, 48 y.o.   MRN: [id number]  CC: f/u   HPI: Patient presents today for a follow up visit.  Patient reports that he is scheduled for a stress test before he can have a AV fistula placement.  Patient reports that he has to reschedule due to scheduling concerns. Reports that he took his first dose of BP medication 30 minutes before this visit. He reports a 30 lb weight loss in the last year and states that he has dramatically changed his eating habits.  He states that he has decided he wants to take of himself and live longer.   No Known Allergies Past Medical History  Diagnosis Date  . Enlarged heart   . Hypertension   . Morbid obesity   . Chronic kidney disease    Current Outpatient Prescriptions on File Prior to Visit  Medication Sig Dispense Refill  . amLODipine (NORVASC) 10 MG tablet Take 1 tablet (10 mg total) by mouth at bedtime.  30 tablet  2  . calcitRIOL (ROCALTROL) 0.25 MCG capsule Take 1 capsule (0.25 mcg total) by mouth daily.  30 capsule  2  . furosemide (LASIX) 80 MG tablet Take 40 mg by mouth 2 (two) times daily.      . isosorbide mononitrate (IMDUR) 30 MG 24 hr tablet Take 1 tablet (30 mg total) by mouth daily.  30 tablet  2  . labetalol (NORMODYNE) 300 MG tablet Take 1 tablet (300 mg total) by mouth 3 (three) times daily.  90 tablet  3   No current facility-administered medications on file prior to visit.   Family History  Problem Relation Age of Onset  . Diabetes Mother   . Hypertension Mother   . Heart attack Mother 26  . Stroke Father   . Hypertension Father   . Depression Father   . Diabetes Brother   . Hypertension Brother   . Stroke Brother 39   History   Social History  . Marital Status: Single    Spouse Name: N/A    Number of Children: 2  . Years of Education: N/A   Occupational History  . Not on file.   Social History Main Topics  . Smoking status: Never Smoker   . Smokeless tobacco: Never Used  .  Alcohol Use: No  . Drug Use: No  . Sexual Activity: No   Other Topics Concern  . Not on file   Social History Narrative   Lives alone.     Review of Systems: Constitutional: Negative for fever, chills, diaphoresis, activity change, appetite change and fatigue. HENT: Negative for ear pain, nosebleeds, congestion, facial swelling, rhinorrhea, neck pain, neck stiffness and ear discharge.  Eyes: Negative for pain, discharge, redness, itching and visual disturbance. Respiratory: Negative for cough, choking, chest tightness, shortness of breath, wheezing and stridor.  Cardiovascular: Negative for chest pain, palpitations and leg swelling. Gastrointestinal: Negative for abdominal distention. Genitourinary: Negative for dysuria, urgency, frequency, hematuria, flank pain, decreased urine volume, difficulty urinating and dyspareunia.  Musculoskeletal: Negative for back pain, joint swelling, arthralgias and gait problem. Neurological: Negative for dizziness, tremors, seizures, syncope, facial asymmetry, speech difficulty, weakness, light-headedness, numbness and headaches.  Hematological: Negative for adenopathy. Does not bruise/bleed easily. Psychiatric/Behavioral: Negative for hallucinations, behavioral problems, confusion, dysphoric mood, decreased concentration and agitation.    Objective:   Filed Vitals:   01/11/14 1509  BP: 150/94  Pulse: 77  Temp: 98.7 F (37.1 C)  Resp: 16  Physical Exam: Constitutional: Patient appears well-developed and well-nourished. No distress. HENT: Normocephalic, atraumatic, External right and left ear normal. Oropharynx is clear and moist.  Eyes: Conjunctivae and EOM are normal. PERRLA, no scleral icterus. Neck: Normal ROM. Neck supple. No JVD. No tracheal deviation. No thyromegaly. CVS: RRR, S1/S2 +, no murmurs, no gallops, no carotid bruit.  Pulmonary: Effort and breath sounds normal, no stridor, rhonchi, wheezes, rales.  Abdominal: Soft. BS +,   no distension, tenderness, rebound or guarding.  Musculoskeletal: Normal range of motion. No edema and no tenderness.  Lymphadenopathy: No lymphadenopathy noted, cervical Neuro: Alert. Normal reflexes, muscle tone coordination. No cranial nerve deficit. Skin: Skin is warm and dry. No rash noted. Not diaphoretic. No erythema. No pallor. Psychiatric: Normal mood and affect. Behavior, judgment, thought content normal.  Lab Results  Component Value Date   WBC 4.0 11/01/2013   HGB 13.1 11/01/2013   HCT 38.3* 11/01/2013   MCV 84.0 11/01/2013   PLT 175 11/01/2013   Lab Results  Component Value Date   CREATININE 3.88* 11/01/2013   BUN 33* 11/01/2013   NA 137 11/01/2013   K 3.3* 11/01/2013   CL 101 11/01/2013   CO2 26 11/01/2013    Lab Results  Component Value Date   HGBA1C 5.5 11/01/2013   Lipid Panel     Component Value Date/Time   CHOL 184 11/01/2013 1649   TRIG 115 11/01/2013 1649   HDL 40 11/01/2013 1649   CHOLHDL 4.6 11/01/2013 1649   VLDL 23 11/01/2013 1649   LDLCALC 121* 11/01/2013 1649       Assessment and plan:   [name] was seen today for follow-up.  Diagnoses and associated orders for this visit:  Chronic kidney disease (CKD), stage IV (severe)  HTN (hypertension) Continue current medication regimen and f/u with vascular for AV fistula placement. Will not make changes to medication regimen today. Acute on chronic combined systolic and diastolic CHF (congestive heart failure) Continue follow up with Cardiology and daily weights.  Return in about 4 weeks (around 02/08/2014) for Hypertension.   Lance Bosch, [facility] [phone] 01/14/2014, 12:51 AM

## 2014-01-23 ENCOUNTER — Ambulatory Visit: Payer: Self-pay | Admitting: Internal Medicine

## 2014-01-24 ENCOUNTER — Other Ambulatory Visit: Payer: Self-pay | Admitting: Internal Medicine

## 2014-02-07 ENCOUNTER — Ambulatory Visit: Payer: Self-pay

## 2014-02-07 ENCOUNTER — Ambulatory Visit: Payer: Self-pay | Admitting: Internal Medicine

## 2014-02-11 ENCOUNTER — Ambulatory Visit: Payer: Self-pay | Admitting: Internal Medicine

## 2014-02-14 NOTE — Telephone Encounter (Signed)
Encounter complete. 

## 2014-02-28 NOTE — Telephone Encounter (Signed)
Encounter complete. 

## 2014-03-07 ENCOUNTER — Other Ambulatory Visit: Payer: Self-pay | Admitting: Internal Medicine

## 2014-03-08 ENCOUNTER — Other Ambulatory Visit: Payer: Self-pay | Admitting: Internal Medicine

## 2014-03-11 ENCOUNTER — Other Ambulatory Visit: Payer: Self-pay | Admitting: Internal Medicine

## 2014-03-11 DIAGNOSIS — I509 Heart failure, unspecified: Secondary | ICD-10-CM

## 2014-03-11 NOTE — Telephone Encounter (Signed)
Have patient come back for a BMP so that I may check his potassium before refilling this medication. Thanks

## 2014-03-20 ENCOUNTER — Telehealth: Payer: Self-pay

## 2014-03-20 DIAGNOSIS — I5043 Acute on chronic combined systolic (congestive) and diastolic (congestive) heart failure: Secondary | ICD-10-CM

## 2014-03-20 NOTE — Telephone Encounter (Signed)
Patient notified that provider wants lab-BMP checked prior to Lasix being refilled. Patient verbalized understanding, and lab appointment scheduled for tomorrow-03/21/14.

## 2014-03-21 ENCOUNTER — Other Ambulatory Visit: Payer: Self-pay

## 2014-03-22 ENCOUNTER — Other Ambulatory Visit: Payer: Self-pay

## 2014-03-22 ENCOUNTER — Other Ambulatory Visit: Payer: Self-pay | Admitting: Internal Medicine

## 2014-03-26 ENCOUNTER — Ambulatory Visit: Payer: Self-pay | Attending: Internal Medicine

## 2014-03-26 DIAGNOSIS — I5043 Acute on chronic combined systolic (congestive) and diastolic (congestive) heart failure: Secondary | ICD-10-CM

## 2014-03-26 LAB — BASIC METABOLIC PANEL
BUN: 37 mg/dL — AB (ref 6–23)
CALCIUM: 8.7 mg/dL (ref 8.4–10.5)
CO2: 27 mEq/L (ref 19–32)
CREATININE: 3.67 mg/dL — AB (ref 0.50–1.35)
Chloride: 103 mEq/L (ref 96–112)
GLUCOSE: 102 mg/dL — AB (ref 70–99)
Potassium: 3.8 mEq/L (ref 3.5–5.3)
Sodium: 138 mEq/L (ref 135–145)

## 2014-03-27 ENCOUNTER — Other Ambulatory Visit: Payer: Self-pay | Admitting: Internal Medicine

## 2014-03-27 DIAGNOSIS — I5043 Acute on chronic combined systolic (congestive) and diastolic (congestive) heart failure: Secondary | ICD-10-CM

## 2014-03-28 ENCOUNTER — Telehealth: Payer: Self-pay | Admitting: Emergency Medicine

## 2014-03-28 NOTE — Telephone Encounter (Signed)
Message copied by Ricci Barker on Thu Mar 28, 2014  3:49 PM ------      Message from: Chari Manning A      Created: Wed Mar 27, 2014  9:51 PM       Please call patient and find out if he been going to see a Nephrologist? If not please send patient a referral to see one. Thanks. ------

## 2014-03-28 NOTE — Telephone Encounter (Signed)
Pt states he has scheduled appt with Nephrology 04/02/2014

## 2014-04-02 ENCOUNTER — Telehealth: Payer: Self-pay | Admitting: Internal Medicine

## 2014-04-02 NOTE — Telephone Encounter (Signed)
Pt. Calling to get results of blood work. Please f/u with pt.

## 2014-04-09 ENCOUNTER — Telehealth: Payer: Self-pay | Admitting: Vascular Surgery

## 2014-04-09 NOTE — Telephone Encounter (Addendum)
Message copied by Gena Fray on Tue Apr 09, 2014  2:20 PM ------      Message from: Denman George      Created: Mon Apr 08, 2014  4:51 PM      Regarding: RE: ? Scheduling       FYI:  I will keep this pt. on my radar.  I got ahold of him; I emphasized that he needed to call and resched. His stress test at [facility].  He agreed to do this and to call me with the date that is scheduled.  I think I will then set him up for OV, since he last saw [facility] 11/07/13.                ----- Message -----         From: Gena Fray         Sent: 04/05/2014   3:18 PM           To: Lynetta Mare Pullins, RN      Subject: ? Scheduling                                             Hey Peyton Bottoms called from [facility] asking that we "get Mr Choy back on the schedule for access". Peter Congo was told in the past that she did not need to send a new referral for existing patients.  From what I can tell, Mr Drechsler was seen for consult here and was told to complete cardiac testing, he canceled cardiac eval and did not go through with surgery. So, my question is where to start with this patient. Should I just reschedule his Myoview?            Thanks,      Hinton Dyer       ------

## 2014-04-10 ENCOUNTER — Encounter: Payer: Self-pay | Admitting: *Deleted

## 2014-04-10 NOTE — Progress Notes (Signed)
Progress Notes received from [facility]. Progress Notes placed in Roney Jaffe, NP box. Vivia Birmingham, RN

## 2014-04-11 ENCOUNTER — Ambulatory Visit (HOSPITAL_COMMUNITY)
Admission: RE | Admit: 2014-04-11 | Discharge: 2014-04-11 | Disposition: A | Discharge status: Home / Self Care | Payer: Self-pay | Source: Ambulatory Visit | Attending: Cardiology | Admitting: Cardiology

## 2014-04-11 DIAGNOSIS — I5043 Acute on chronic combined systolic (congestive) and diastolic (congestive) heart failure: Secondary | ICD-10-CM | POA: Insufficient documentation

## 2014-04-11 DIAGNOSIS — I509 Heart failure, unspecified: Secondary | ICD-10-CM | POA: Insufficient documentation

## 2014-04-11 DIAGNOSIS — I517 Cardiomegaly: Secondary | ICD-10-CM | POA: Insufficient documentation

## 2014-04-11 DIAGNOSIS — R9439 Abnormal result of other cardiovascular function study: Secondary | ICD-10-CM | POA: Insufficient documentation

## 2014-04-11 MED ORDER — TECHNETIUM TC 99M SESTAMIBI GENERIC - CARDIOLITE
10.1000 | Freq: Once | INTRAVENOUS | Status: AC | PRN
  Administered 2014-04-11: 10.1 via INTRAVENOUS

## 2014-04-11 MED ORDER — AMINOPHYLLINE 25 MG/ML IV SOLN
75.0000 mg | Freq: Once | INTRAVENOUS | Status: AC
  Administered 2014-04-11: 75 mg via INTRAVENOUS

## 2014-04-11 MED ORDER — REGADENOSON 0.4 MG/5ML IV SOLN
0.4000 mg | Freq: Once | INTRAVENOUS | Status: AC
  Administered 2014-04-11: 0.4 mg via INTRAVENOUS

## 2014-04-11 MED ORDER — TECHNETIUM TC 99M SESTAMIBI GENERIC - CARDIOLITE
30.7000 | Freq: Once | INTRAVENOUS | Status: AC | PRN
  Administered 2014-04-11: 30.7 via INTRAVENOUS

## 2014-04-11 NOTE — Procedures (Addendum)
[facility] [address] [place] [address] [id number]  Cardiology Nuclear Med Study  Devin Bullock is a 48 y.o. male     MRN : [id number]     DOB: 1966-03-09  Procedure Date: 04/11/2014  Nuclear Med Background Indication for Stress Test:  Surgical Clearance History:  systolic and diastolic heart 99991111 prior NUC MPI for comparison. Cardiac Risk Factors: Family History - CAD, Hypertension and Obesity  Symptoms:  Fatigue   Nuclear Pre-Procedure Caffeine/Decaff Intake:  1:00am NPO After: 11am   IV Site: R Hand  IV 0.9% NS with Angio Cath:  22g  Chest Size (in):  52"  IV Started by: Rolene Course, RN  Height: 6\' 4"  (1.93 m)  Cup Size: n/a  BMI:  Body mass index is 37.63 kg/(m^2). Weight:  309 lb (140.161 kg)   Tech Comments:  n/a    Nuclear Med Study 1 or 2 day study: 1 day  Stress Test Type:  [place] Provider:  Minus Breeding, MD   Resting Radionuclide: Technetium 27m Sestamibi  Resting Radionuclide Dose: 10.1 mCi   Stress Radionuclide:  Technetium 66m Sestamibi  Stress Radionuclide Dose: 30.7 mCi           Stress Protocol Rest HR: 71 Stress HR: 98  Rest BP: 164/108 Stress BP: 208/104  Exercise Time (min): n/a METS: n/a          Dose of Adenosine (mg):  n/a Dose of Lexiscan: 0.4 mg  Dose of Atropine (mg): n/a Dose of Dobutamine: n/a mcg/kg/min (at max HR)  Stress Test Technologist: Mellody Memos, CCT Nuclear Technologist: Imagene Riches, CNMT   Rest Procedure:  Myocardial perfusion imaging was performed at rest 45 minutes following the intravenous administration of Technetium 50m Sestamibi. Stress Procedure:  The patient received IV Lexiscan 0.4 mg over 15-seconds.  Technetium 86m Sestamibi injected at 30-seconds.  Patient experienced shortness of breath and was administered 75 mg of Aminophylline IV at 5 minutes. There were no significant changes with Lexiscan.  Quantitative  spect images were obtained after a 45 minute delay.  Transient Ischemic Dilatation (Normal <1.22):  1.07  QGS EDV:  206 ml QGS ESV:  120 ml LV Ejection Fraction: 42%       Rest ECG: NSR, LVH with repolarization abnormality, LAD.  Stress ECG: No significant ST segment change suggestive of ischemia.  QPS Raw Data Images:  Acquisition technically good; LVE. Stress Images:  There is decreased uptake in the apex. Rest Images:  There is decreased uptake in the apex; slightly less prominent compared to the stress images. Subtraction (SDS):  These findings are consistent with probable apical thinning; cannot R/O trivial ischemia.  Impression Exercise Capacity:  Lexiscan with no exercise. BP Response:  Hypertensive blood pressure response. Clinical Symptoms:  There is dyspnea. ECG Impression:  No significant ST segment change suggestive of ischemia. Comparison with Prior Nuclear Study: No previous nuclear study performed  Overall Impression:  Intermediate risk stress nuclear study with a small, severe intensity, reversible apical defect consistent with probable apical thinning; cannot R/O minimal apical ischemia; significant LVH and LVE; intermediate risk due to decreased LV function.  LV Wall Motion:  Global hypokinesis.   Kirk Ruths, MD  04/11/2014 4:41 PM

## 2014-04-19 ENCOUNTER — Telehealth: Payer: Self-pay | Admitting: Cardiology

## 2014-04-19 ENCOUNTER — Telehealth: Payer: Self-pay | Admitting: Vascular Surgery

## 2014-04-19 NOTE — Telephone Encounter (Signed)
Pt said Dr Bobette Mo 's office told him to call Dr Warren Lacy to get his stress test results.

## 2014-04-19 NOTE — Telephone Encounter (Signed)
Pt informed of his stress test

## 2014-04-19 NOTE — Telephone Encounter (Signed)
JC please address.

## 2014-04-19 NOTE — Telephone Encounter (Signed)
Message copied by Gena Fray on Fri Apr 19, 2014  4:35 PM ------      Message from: Denman George      Created: Fri Apr 19, 2014  3:33 PM      Regarding: NEEDS OFFICE VISIT WITH [facility]      Contact: [phone]       This patient called for stress test results, and to reschedule appt. with Dr.Dickson.  Since Dr. Percival Spanish ordered the ST, I informed him he should call [facility] to get test results.  He is requesting to schedule an office visit with Dr. Scot Dock, to schedule his fistula placement.  (the ST showed intermediate risk, so not sure if he will have to have a cardiac cath)  He was to call Dr. Rosezella [place] office for results, and I told him I would have you call him to schedule office appt.  Thanks.  ------

## 2014-04-25 ENCOUNTER — Ambulatory Visit: Payer: Self-pay

## 2014-04-30 ENCOUNTER — Encounter: Payer: Self-pay | Admitting: Vascular Surgery

## 2014-04-30 ENCOUNTER — Other Ambulatory Visit: Payer: Self-pay | Admitting: Internal Medicine

## 2014-05-01 ENCOUNTER — Encounter: Payer: Self-pay | Admitting: Vascular Surgery

## 2014-05-01 ENCOUNTER — Ambulatory Visit (INDEPENDENT_AMBULATORY_CARE_PROVIDER_SITE_OTHER): Payer: Self-pay | Admitting: Vascular Surgery

## 2014-05-01 ENCOUNTER — Other Ambulatory Visit: Payer: Self-pay

## 2014-05-01 VITALS — BP 172/113 | HR 86 | Resp 16 | Ht 76.0 in | Wt 323.0 lb

## 2014-05-01 DIAGNOSIS — N184 Chronic kidney disease, stage 4 (severe): Secondary | ICD-10-CM

## 2014-05-01 NOTE — Progress Notes (Signed)
[facility]  Patient name: Devin Bullock MRN: [id number] DOB: 1966-02-04 Sex: male  REASON FOR VISIT: To discuss placement of AV fistula.  HPI: Devin Bullock is a 48 y.o. male who I last saw on 11/07/2013. The patient had been scheduled for placement of hemodialysis access but this was canceled by anesthesia because of very poorly controlled blood pressure. He came back in March and at that time his blood pressure was better controlled and I felt that he would be a good candidate for a left radiocephalic AV fistula. The surgery was scheduled for 12/06/2013.  He tells me that the surgery was not scheduled as he was told he needed cardiac clearance and states that this has been done. He is not yet on dialysis. I have reviewed his notes from [facility]. He has a history of stage V chronic kidney disease and needs access. He does have a history of hypertension and secondary hyperparathyroidism. He does have a history of a cardiomyopathy.   Past Medical History  Diagnosis Date  . Enlarged heart   . Hypertension   . Morbid obesity   . Chronic kidney disease    Family History  Problem Relation Age of Onset  . Diabetes Mother   . Hypertension Mother   . Heart attack Mother 35  . Stroke Father   . Hypertension Father   . Depression Father   . Diabetes Brother   . Hypertension Brother   . Stroke Brother 48  . Alcohol abuse Brother   . Asthma Brother    SOCIAL HISTORY: History  Substance Use Topics  . Smoking status: Never Smoker   . Smokeless tobacco: Never Used  . Alcohol Use: No   No Known Allergies Current Outpatient Prescriptions  Medication Sig Dispense Refill  . amLODipine (NORVASC) 10 MG tablet TAKE 1 TABLET BY MOUTH AT BEDTIME  30 tablet  2  . calcitRIOL (ROCALTROL) 0.25 MCG capsule TAKE 1 CAPSULE BY MOUTH DAILY.  30 capsule  2  . furosemide (LASIX) 80 MG tablet Take 40 mg by mouth 2 (two) times daily.      . isosorbide mononitrate (IMDUR)  30 MG 24 hr tablet TAKE 1 TABLET BY MOUTH ONCE DAILY  30 tablet  2  . labetalol (NORMODYNE) 300 MG tablet TAKE 1 TABLET BY MOUTH 3 TIMES DAILY.  90 tablet  3  . furosemide (LASIX) 40 MG tablet TAKE 2 TABLETS BY MOUTH 2 TIMES DAILY.  120 tablet  2   No current facility-administered medications for this visit.   REVIEW OF SYSTEMS: Valu.Nieves ] denotes positive finding; [  ] denotes negative finding  CARDIOVASCULAR:  [ ]  chest pain   [ ]  chest pressure   [ ]  palpitations   [ ]  orthopnea   Valu.Nieves ] dyspnea on exertion   [ ]  claudication   [ ]  rest pain   [ ]  DVT   [ ]  phlebitis PULMONARY:   [ ]  productive cough   [ ]  asthma   [ ]  wheezing NEUROLOGIC:   [ ]  weakness  [ ]  paresthesias  [ ]  aphasia  [ ]  amaurosis  [ ]  dizziness HEMATOLOGIC:   [ ]  bleeding problems   [ ]  clotting disorders MUSCULOSKELETAL:  [ ]  joint pain   [ ]  joint swelling [ ]  leg swelling GASTROINTESTINAL: [ ]   blood in stool  [ ]   hematemesis GENITOURINARY:  [ ]   dysuria  [ ]   hematuria PSYCHIATRIC:  [ ]   history of major depression INTEGUMENTARY:  [ ]  rashes  [ ]  ulcers CONSTITUTIONAL:  [ ]  fever   [ ]  chills  PHYSICAL EXAM: Filed Vitals:   05/01/14 1158  BP: 172/113  Pulse: 86  Resp: 16  Height: 6\' 4"  (1.93 m)  Weight: 323 lb (146.512 kg)  SpO2: 96%   Body mass index is 39.33 kg/(m^2). GENERAL: The patient is a well-nourished male, in no acute distress. The vital signs are documented above. CARDIOVASCULAR: There is a regular rate and rhythm. He has a palpable radial pulse bilaterally. PULMONARY: There is good air exchange bilaterally without wheezing or rales. ABDOMEN: Soft and non-tender with normal pitched bowel sounds.  MUSCULOSKELETAL: There are no major deformities or cyanosis. NEUROLOGIC: No focal weakness or paresthesias are detected. SKIN: There are no ulcers or rashes noted. PSYCHIATRIC: The patient has a normal affect.  DATA:  I reviewed his previous vein map which shows that he may potentially have a usable  forearm cephalic vein on the left or ulcer on upper arm cephalic vein.  MEDICAL ISSUES: END-STAGE RENAL DISEASE: I have recommended placement of a left radiocephalic AV fistula or brachiocephalic AV fistula. He is taking a trip to [place] and would like to schedule this on 05/14/2014. We have reviewed the procedure and potential complications and he is agreeable to proceed.  [facility] [place] Beeper: [phone]

## 2014-05-06 ENCOUNTER — Telehealth: Payer: Self-pay

## 2014-05-06 ENCOUNTER — Other Ambulatory Visit: Payer: Self-pay

## 2014-05-06 ENCOUNTER — Other Ambulatory Visit: Payer: Self-pay | Admitting: Internal Medicine

## 2014-05-06 NOTE — Telephone Encounter (Signed)
Patient requesting a refill on medication Last appointment was in may Patient will need to schedule and appointment for future Medication refills

## 2014-05-10 ENCOUNTER — Encounter (HOSPITAL_COMMUNITY): Payer: Self-pay

## 2014-05-13 ENCOUNTER — Encounter (HOSPITAL_COMMUNITY): Payer: Self-pay | Admitting: *Deleted

## 2014-05-13 MED ORDER — DEXTROSE 5 % IV SOLN
1.5000 g | INTRAVENOUS | Status: AC
  Administered 2014-05-24: 1.5 g via INTRAVENOUS
  Filled 2014-05-13: qty 1.5

## 2014-05-13 MED ORDER — SODIUM CHLORIDE 0.9 % IV SOLN: INTRAVENOUS | Status: DC

## 2014-05-13 NOTE — Progress Notes (Signed)
Patient called stating he wanted to cancel procedure. Advised patient to contact Dr. Nicole Cella office give patient the office number to contact.

## 2014-05-13 NOTE — Progress Notes (Signed)
Pt stated that he has been taking all of his BP medications as prescribed. Pt stated that he takes Amlodipine ( Norvasc) at night. Pt denies history of CHF. Pt denies SOB, chest pain, and being under the care of a cardiologist.

## 2014-05-13 NOTE — Progress Notes (Signed)
Anesthesia Chart Review:  Patient is a 48 year old male scheduled for creation of a left brachiocephalic AVF on 0000000 by Dr. Scot Dock.  He is scheduled to be a same day work-up.  He has been scheduled numerous times in the past, and he either rescheduled or anesthesia recommended delaying surgery until he could have further evaluation for HTN and cardiomyopathy.  He has since had medication adjustment for his BP and underwent a stress test (see below). Of note, his BP was elevated at his last [facility] office visit, but he did contact his PCP for refills on 05/06/14.  I did ask his phone interviewing PAT RN to ensure that he was being compliant with all of his medications. (His previous BP at [place] on 01/11/14 was 150/94.)  History includes CKD stage IV not yet on hemodialysis, non-smoker, dilated cardiomyopathy (severely dilated LV cavity/LVH with EF 45-50% 03/2013), combined systolic and diastolic CHF with last hospitalization for exacerbation with hypertensive emergency with mild elevation in troponin (0.38-0.42) felt to be related to demand ischemia 03/2013, HTN, morbid obesity. Reportedly, he has lost some weight within the past year and last BMI recorded in Epic was 39.33 consistent with obesity. PCP is now with [facility]. Cardiologist is Dr. Percival Spanish who recommended a preoperative stress test (see below). Nephrologist is Dr. [name].   EKG on 11/27/13 showed: NSR, LVH with repolarization abnormality (versus consider lateral ischemia).  Lateral T wave abnormality is not new.  Nuclear stress test on 04/11/14 showed: Overall Impression: Intermediate risk stress nuclear study with a small, severe intensity, reversible apical defect consistent with probable apical thinning; cannot R/O minimal apical ischemia; significant LVH and LVE; intermediate risk due to decreased LV function. LV wall motion: Global hypokinesis.  Report was read by Dr. Percival Spanish who stated, "No high risk  findings. Perhaps a small amount of apical ischemia but likely artifact. No change in therapy. No further testing."  Echo on 04/13/13 showed:  - Left ventricle: The cavity size was severely dilated. Wall thickness was increased in a pattern of severe LVH. Systolic function was mildly reduced. The estimated ejection fraction was in the range of 45% to 50%. Wall motion was normal; there were no regional wall motion abnormalities. There was fusion of early and atrial contributions to ventricular filling. - Aortic valve: Trivial regurgitation. - Mitral valve: Mild regurgitation. - Left atrium: The atrium was severely dilated. - Right atrium: The atrium was moderately dilated. - Pericardium, extracardiac: A moderate, free-flowing pericardial effusion was identified circumferential to the heart. There was no evidence of hemodynamic compromise.  Notes from 03/2013 indicate that catheterization as been avoided due to his severe renal dysfunction.   He will be for CXR and labs on the day of surgery. He will get vitals on arrival and further evaluated by his surgeon and anesthesiologist at that time.  Definitive anesthesia plan will be made at that time.  Procedure is currently posted for MAC anesthesia.   [facility] Short Stay Center/Anesthesiology Phone [phone] 05/13/2014 11:54 AM

## 2014-05-13 NOTE — Progress Notes (Signed)
05/13/14 1123  OBSTRUCTIVE SLEEP APNEA  Have you ever been diagnosed with sleep apnea through a sleep study? No  Do you snore loudly (loud enough to be heard through closed doors)?  0  Do you often feel tired, fatigued, or sleepy during the daytime? 1  Has anyone observed you stop breathing during your sleep? 0  Do you have, or are you being treated for high blood pressure? 1  BMI more than 35 kg/m2? 1  Age over 50 years old? 0  Gender: 1  Obstructive Sleep Apnea Score 4

## 2014-05-14 ENCOUNTER — Ambulatory Visit (HOSPITAL_COMMUNITY): Admission: RE | Admit: 2014-05-14 | Payer: Self-pay | Source: Ambulatory Visit | Admitting: Vascular Surgery

## 2014-05-14 SURGERY — ARTERIOVENOUS (AV) FISTULA CREATION
Anesthesia: Monitor Anesthesia Care | Laterality: Left

## 2014-05-15 ENCOUNTER — Encounter (HOSPITAL_COMMUNITY): Payer: Self-pay | Admitting: Pharmacy Technician

## 2014-05-20 ENCOUNTER — Ambulatory Visit: Payer: Self-pay

## 2014-05-23 NOTE — Progress Notes (Signed)
Pt updated with arrival time of 6:45 AM on Friday, 05/24/14 at [place] office for procedure in Short Stay.

## 2014-05-24 ENCOUNTER — Ambulatory Visit (HOSPITAL_COMMUNITY): Payer: Self-pay

## 2014-05-24 ENCOUNTER — Ambulatory Visit (HOSPITAL_COMMUNITY): Payer: Self-pay | Admitting: Vascular Surgery

## 2014-05-24 ENCOUNTER — Encounter (HOSPITAL_COMMUNITY): Payer: Self-pay | Admitting: Vascular Surgery

## 2014-05-24 ENCOUNTER — Telehealth: Payer: Self-pay | Admitting: Vascular Surgery

## 2014-05-24 ENCOUNTER — Encounter (HOSPITAL_COMMUNITY): Payer: Self-pay

## 2014-05-24 ENCOUNTER — Telehealth: Payer: Self-pay

## 2014-05-24 ENCOUNTER — Other Ambulatory Visit: Payer: Self-pay

## 2014-05-24 ENCOUNTER — Encounter (HOSPITAL_COMMUNITY)
Admission: RE | Disposition: A | Discharge status: Home / Self Care | Payer: Self-pay | Source: Ambulatory Visit | Attending: Vascular Surgery

## 2014-05-24 ENCOUNTER — Ambulatory Visit (HOSPITAL_COMMUNITY)
Admission: RE | Admit: 2014-05-24 | Discharge: 2014-05-24 | Disposition: A | Discharge status: Home / Self Care | Payer: Self-pay | Source: Ambulatory Visit | Attending: Vascular Surgery | Admitting: Vascular Surgery

## 2014-05-24 DIAGNOSIS — Z4931 Encounter for adequacy testing for hemodialysis: Secondary | ICD-10-CM

## 2014-05-24 DIAGNOSIS — N2581 Secondary hyperparathyroidism of renal origin: Secondary | ICD-10-CM | POA: Insufficient documentation

## 2014-05-24 DIAGNOSIS — I129 Hypertensive chronic kidney disease with stage 1 through stage 4 chronic kidney disease, or unspecified chronic kidney disease: Secondary | ICD-10-CM | POA: Insufficient documentation

## 2014-05-24 DIAGNOSIS — Z01818 Encounter for other preprocedural examination: Secondary | ICD-10-CM

## 2014-05-24 DIAGNOSIS — Z79899 Other long term (current) drug therapy: Secondary | ICD-10-CM | POA: Insufficient documentation

## 2014-05-24 DIAGNOSIS — N184 Chronic kidney disease, stage 4 (severe): Secondary | ICD-10-CM | POA: Insufficient documentation

## 2014-05-24 DIAGNOSIS — N186 End stage renal disease: Secondary | ICD-10-CM

## 2014-05-24 HISTORY — PX: AV FISTULA PLACEMENT: SHX1204

## 2014-05-24 LAB — POCT I-STAT 4, (NA,K, GLUC, HGB,HCT)
Glucose, Bld: 107 mg/dL — ABNORMAL HIGH (ref 70–99)
HCT: 39 % (ref 39.0–52.0)
Hemoglobin: 13.3 g/dL (ref 13.0–17.0)
Potassium: 3.1 mEq/L — ABNORMAL LOW (ref 3.7–5.3)
Sodium: 143 mEq/L (ref 137–147)

## 2014-05-24 SURGERY — ARTERIOVENOUS (AV) FISTULA CREATION
Anesthesia: General | Site: Arm Upper | Laterality: Left

## 2014-05-24 MED ORDER — OXYCODONE HCL 5 MG PO TABS: 5.0000 mg | ORAL_TABLET | Freq: Once | ORAL | Status: DC | PRN

## 2014-05-24 MED ORDER — HEPARIN SODIUM (PORCINE) 1000 UNIT/ML IJ SOLN
INTRAMUSCULAR | Status: DC | PRN
  Administered 2014-05-24: 8000 [IU] via INTRAVENOUS

## 2014-05-24 MED ORDER — LIDOCAINE HCL (PF) 1 % IJ SOLN
INTRAMUSCULAR | Status: AC
  Filled 2014-05-24: qty 30

## 2014-05-24 MED ORDER — HEPARIN SODIUM (PORCINE) 1000 UNIT/ML IJ SOLN
INTRAMUSCULAR | Status: AC
  Filled 2014-05-24: qty 1

## 2014-05-24 MED ORDER — CHLORHEXIDINE GLUCONATE CLOTH 2 % EX PADS: 6.0000 | MEDICATED_PAD | Freq: Once | CUTANEOUS | Status: DC

## 2014-05-24 MED ORDER — OXYCODONE HCL 5 MG PO TABS: 5.0000 mg | ORAL_TABLET | Freq: Four times a day (QID) | ORAL | Status: DC | PRN

## 2014-05-24 MED ORDER — PROPOFOL 10 MG/ML IV BOLUS
INTRAVENOUS | Status: AC
  Filled 2014-05-24: qty 20

## 2014-05-24 MED ORDER — FENTANYL CITRATE 0.05 MG/ML IJ SOLN
INTRAMUSCULAR | Status: DC | PRN
  Administered 2014-05-24 (×2): 50 ug via INTRAVENOUS

## 2014-05-24 MED ORDER — OXYCODONE HCL 5 MG/5ML PO SOLN: 5.0000 mg | Freq: Once | ORAL | Status: DC | PRN

## 2014-05-24 MED ORDER — PROPOFOL INFUSION 10 MG/ML OPTIME
INTRAVENOUS | Status: DC | PRN
  Administered 2014-05-24: 50 ug/kg/min via INTRAVENOUS

## 2014-05-24 MED ORDER — THROMBIN 20000 UNITS EX SOLR
CUTANEOUS | Status: AC
  Filled 2014-05-24: qty 20000

## 2014-05-24 MED ORDER — SODIUM CHLORIDE 0.9 % IV SOLN
INTRAVENOUS | Status: DC | PRN
  Administered 2014-05-24: 09:00:00 via INTRAVENOUS

## 2014-05-24 MED ORDER — PAPAVERINE HCL 30 MG/ML IJ SOLN
INTRAMUSCULAR | Status: AC
  Filled 2014-05-24: qty 2

## 2014-05-24 MED ORDER — SODIUM CHLORIDE 0.9 % IV SOLN
INTRAVENOUS | Status: DC
  Administered 2014-05-24: 35 mL/h via INTRAVENOUS

## 2014-05-24 MED ORDER — LIDOCAINE HCL (PF) 1 % IJ SOLN
INTRAMUSCULAR | Status: DC | PRN
  Administered 2014-05-24: 11 mL

## 2014-05-24 MED ORDER — MIDAZOLAM HCL 2 MG/2ML IJ SOLN
INTRAMUSCULAR | Status: AC
  Filled 2014-05-24: qty 2

## 2014-05-24 MED ORDER — HEPARIN SODIUM (PORCINE) 5000 UNIT/ML IJ SOLN
INTRAMUSCULAR | Status: DC | PRN
  Administered 2014-05-24: 10:00:00

## 2014-05-24 MED ORDER — 0.9 % SODIUM CHLORIDE (POUR BTL) OPTIME
TOPICAL | Status: DC | PRN
  Administered 2014-05-24: 1000 mL

## 2014-05-24 MED ORDER — HYDROMORPHONE HCL 1 MG/ML IJ SOLN: 0.2500 mg | INTRAMUSCULAR | Status: DC | PRN

## 2014-05-24 MED ORDER — MIDAZOLAM HCL 5 MG/5ML IJ SOLN
INTRAMUSCULAR | Status: DC | PRN
  Administered 2014-05-24: 2 mg via INTRAVENOUS

## 2014-05-24 MED ORDER — PROMETHAZINE HCL 25 MG/ML IJ SOLN: 6.2500 mg | INTRAMUSCULAR | Status: DC | PRN

## 2014-05-24 MED ORDER — PROTAMINE SULFATE 10 MG/ML IV SOLN
INTRAVENOUS | Status: DC | PRN
  Administered 2014-05-24: 40 mg via INTRAVENOUS

## 2014-05-24 MED ORDER — FENTANYL CITRATE 0.05 MG/ML IJ SOLN
INTRAMUSCULAR | Status: AC
  Filled 2014-05-24: qty 5

## 2014-05-24 SURGICAL SUPPLY — 34 items
ARMBAND PINK RESTRICT EXTREMIT (MISCELLANEOUS) ×2 IMPLANT
BLADE SURG 10 STRL SS (BLADE) ×4 IMPLANT
CANISTER SUCTION 2500CC (MISCELLANEOUS) ×2 IMPLANT
CANNULA VESSEL 3MM 2 BLNT TIP (CANNULA) ×2 IMPLANT
CLIP TI MEDIUM 6 (CLIP) ×2 IMPLANT
CLIP TI WIDE RED SMALL 6 (CLIP) ×2 IMPLANT
COVER PROBE W GEL 5X96 (DRAPES) IMPLANT
COVER SURGICAL LIGHT HANDLE (MISCELLANEOUS) ×2 IMPLANT
DECANTER SPIKE VIAL GLASS SM (MISCELLANEOUS) ×2 IMPLANT
DERMABOND ADVANCED (GAUZE/BANDAGES/DRESSINGS) ×1
DERMABOND ADVANCED .7 DNX12 (GAUZE/BANDAGES/DRESSINGS) ×1 IMPLANT
DRAIN PENROSE 1/2X12 LTX STRL (WOUND CARE) IMPLANT
ELECT REM PT RETURN 9FT ADLT (ELECTROSURGICAL) ×2
ELECTRODE REM PT RTRN 9FT ADLT (ELECTROSURGICAL) ×1 IMPLANT
GLOVE BIO SURGEON STRL SZ 6.5 (GLOVE) ×4 IMPLANT
GLOVE BIO SURGEON STRL SZ7.5 (GLOVE) ×2 IMPLANT
GLOVE BIOGEL PI IND STRL 7.5 (GLOVE) ×1 IMPLANT
GLOVE BIOGEL PI IND STRL 8 (GLOVE) ×1 IMPLANT
GLOVE BIOGEL PI INDICATOR 7.5 (GLOVE) ×1
GLOVE BIOGEL PI INDICATOR 8 (GLOVE) ×1
GOWN STRL REUS W/ TWL LRG LVL3 (GOWN DISPOSABLE) ×3 IMPLANT
GOWN STRL REUS W/TWL LRG LVL3 (GOWN DISPOSABLE) ×3
KIT BASIN OR (CUSTOM PROCEDURE TRAY) ×2 IMPLANT
KIT ROOM TURNOVER OR (KITS) ×2 IMPLANT
NS IRRIG 1000ML POUR BTL (IV SOLUTION) ×2 IMPLANT
PACK CV ACCESS (CUSTOM PROCEDURE TRAY) ×2 IMPLANT
PAD ARMBOARD 7.5X6 YLW CONV (MISCELLANEOUS) ×4 IMPLANT
SPONGE SURGIFOAM ABS GEL 100 (HEMOSTASIS) IMPLANT
SUT PROLENE 6 0 BV (SUTURE) ×2 IMPLANT
SUT VIC AB 3-0 SH 27 (SUTURE) ×1
SUT VIC AB 3-0 SH 27X BRD (SUTURE) ×1 IMPLANT
SUT VICRYL 4-0 PS2 18IN ABS (SUTURE) ×2 IMPLANT
UNDERPAD 30X30 INCONTINENT (UNDERPADS AND DIAPERS) ×2 IMPLANT
WATER STERILE IRR 1000ML POUR (IV SOLUTION) ×2 IMPLANT

## 2014-05-24 NOTE — Anesthesia Postprocedure Evaluation (Signed)
Anesthesia Post Note  Patient: Devin Bullock  Procedure(s) Performed: Procedure(s) (LRB): BRACHIOCEPHALIC ARTERIOVENOUS (AV) FISTULA CREATION (Left)  Anesthesia type: MAC  Patient location: PACU  Post pain: Pain level controlled  Post assessment: Patient's Cardiovascular Status Stable  Last Vitals:  Filed Vitals:   05/24/14 1145  BP:   Pulse: 68  Temp: 36.4 C  Resp: 20    Post vital signs: Reviewed and stable  Level of consciousness: sedated  Complications: No apparent anesthesia complications

## 2014-05-24 NOTE — Transfer of Care (Signed)
Immediate Anesthesia Transfer of Care Note  Patient: Devin Bullock  Procedure(s) Performed: Procedure(s): BRACHIOCEPHALIC ARTERIOVENOUS (AV) FISTULA CREATION (Left)  Patient Location: PACU  Anesthesia Type:MAC  Level of Consciousness: awake, alert , oriented and patient cooperative  Airway & Oxygen Therapy: Patient Spontanous Breathing  Post-op Assessment: Report given to PACU RN, Post -op Vital signs reviewed and stable and Patient moving all extremities X 4  Post vital signs: Reviewed and stable  Complications: No apparent anesthesia complications

## 2014-05-24 NOTE — Anesthesia Preprocedure Evaluation (Addendum)
Anesthesia Evaluation  Patient identified by MRN, date of birth, ID band Patient awake    Reviewed: Allergy & Precautions, H&P , NPO status , Patient's Chart, lab work & pertinent test results  Airway Mallampati: III TM Distance: >3 FB Neck ROM: Full    Dental  (+) Teeth Intact, Dental Advisory Given   Pulmonary neg pulmonary ROS,    Pulmonary exam normal       Cardiovascular hypertension, Pt. on medications +CHF     Neuro/Psych negative neurological ROS  negative psych ROS   GI/Hepatic negative GI ROS, Neg liver ROS,   Endo/Other  Morbid obesity  Renal/GU CRFRenal disease     Musculoskeletal   Abdominal   Peds  Hematology   Anesthesia Other Findings   Reproductive/Obstetrics                          Anesthesia Physical Anesthesia Plan  ASA: III  Anesthesia Plan: General   Post-op Pain Management:    Induction: Intravenous  Airway Management Planned: LMA  Additional Equipment:   Intra-op Plan:   Post-operative Plan: Extubation in OR  Informed Consent: I have reviewed the patients History and Physical, chart, labs and discussed the procedure including the risks, benefits and alternatives for the proposed anesthesia with the patient or authorized representative who has indicated his/her understanding and acceptance.   Dental advisory given  Plan Discussed with: CRNA, Anesthesiologist and Surgeon  Anesthesia Plan Comments:        Anesthesia Quick Evaluation

## 2014-05-24 NOTE — Op Note (Signed)
    NAME: Devin Bullock    MRN: [id number] DOB: 07-Jun-1966    DATE OF OPERATION: 05/24/2014  PREOP DIAGNOSIS: Stage IV chronic kidney disease  POSTOP DIAGNOSIS: same  PROCEDURE: left radial cephalic AV fistula  SURGEON: Judeth Cornfield. Scot Dock, MD, FACS  ASSIST: Leontine Locket, PA  ANESTHESIA: local with sedation   EBL: minimal  INDICATIONS: [name] is a 48 y.o. male who is not yet on dialysis. He presents for new access.  FINDINGS: 3.5 mm cephalic vein. 3 mm radial artery.  TECHNIQUE: The patient was taken to the operating room and was sedated by anesthesia. The left upper extremity was prepped and draped in the usual sterile fashion. After the skin was infiltrated with 1% lidocaine, an oblique incision was made at the left wrist. Through this incision the cephalic vein was dissected free and ligated distally. It irrigated up nicely with heparinized saline. The radial artery was dissected free beneath the fascia. The patient was heparinized. The radial artery was clamped proximally and distally and a longitudinal arteriotomy was made. The vein was spatulated and sewn end-to-side to the artery using 6-0 Prolene suture. The patient is a good thrill in the fistula. The heparin was partially reversed with protamine. The wounds closed the deep layer 3-0 Vicryl the skin closed with 4-0 Vicryl. Dermabond was applied. Patient tolerated procedure well transferred to recovery in stable condition. All needle and sponge counts were correct.  Deitra Mayo, MD, FACS [facility]  DATE OF DICTATION:   05/24/2014

## 2014-05-24 NOTE — Telephone Encounter (Signed)
Phone call from pt's friend; reported the left AVF site is oozing BRB this afternoon; denied that site is bleeding profusely.  Advised to have pt. lay down, and to elevate the left arm above level of heart, and to apply gauze pad over incision, and apply direct steady pressure to site that is bleeding for about 5-7 minutes.  Recommended to have pt. rest this evening.  Advised to continue to monitor for bleeding, and to cover site with a clean dry gauze bandage.  Recommended if bleeding persists, to take pt. to the ER for evaluation.  Verb. Understanding.

## 2014-05-24 NOTE — Discharge Instructions (Signed)
° ° °  05/24/2014 Devin Bullock [id number] 05-Oct-1965  Surgeon(s): Angelia Mould, MD  Procedure(s): BRACHIOCEPHALIC ARTERIOVENOUS (AV) FISTULA CREATION  x Do not stick graft for 12 weeks   What to eat:  For your first meals, you should eat lightly; only small meals initially.  If you do not have nausea, you may eat larger meals.  Avoid spicy, greasy and heavy food.    General Anesthesia, Adult, Care After  Refer to this sheet in the next few weeks. These instructions provide you with information on caring for yourself after your procedure. Your health care provider may also give you more specific instructions. Your treatment has been planned according to current medical practices, but problems sometimes occur. Call your health care provider if you have any problems or questions after your procedure.  WHAT TO EXPECT AFTER THE PROCEDURE  After the procedure, it is typical to experience:  Sleepiness.  Nausea and vomiting. HOME CARE INSTRUCTIONS  For the first 24 hours after general anesthesia:  Have a responsible person with you.  Do not drive a car. If you are alone, do not take public transportation.  Do not drink alcohol.  Do not take medicine that has not been prescribed by your health care provider.  Do not sign important papers or make important decisions.  You may resume a normal diet and activities as directed by your health care provider.  Change bandages (dressings) as directed.  If you have questions or problems that seem related to general anesthesia, call the hospital and ask for the anesthetist or anesthesiologist on call. SEEK MEDICAL CARE IF:  You have nausea and vomiting that continue the day after anesthesia.  You develop a rash. SEEK IMMEDIATE MEDICAL CARE IF:  You have difficulty breathing.  You have chest pain.  You have any allergic problems. Document Released: 11/08/2000 Document Revised: 04/04/2013 Document Reviewed: 02/15/2013  [facility] Patient  Information 2014 [place], [place].

## 2014-05-24 NOTE — Telephone Encounter (Addendum)
Message copied by Gena Fray on Fri May 24, 2014  1:30 PM ------      Message from: Denman George      Created: Fri May 24, 2014 11:57 AM      Regarding: Micheline Rough; Also needs 6 wk. f/u with CSD and access duplex                   ----- Message -----         From: Angelia Mould, MD         Sent: 05/24/2014  11:08 AM           To: [facility] Charge Pool      Subject: charge                                                   PROCEDURE: left radial cephalic AV fistula            SURGEON: Judeth Cornfield. Scot Dock, MD, FACS            ASSIST: Leontine Locket, PA            The patient is a follow up visit in 6 weeks with the duplex check on the maturation of the fistula. Thank you. CD ------  05/24/14: left msg for patient at home #, dpm

## 2014-05-24 NOTE — H&P (View-Only) (Signed)
[facility]  Patient name: Devin Bullock MRN: [id number] DOB: Jan 12, 1966 Sex: male  REASON FOR VISIT: To discuss placement of AV fistula.  HPI: Devin Bullock is a 48 y.o. male who I last saw on 11/07/2013. The patient had been scheduled for placement of hemodialysis access but this was canceled by anesthesia because of very poorly controlled blood pressure. He came back in March and at that time his blood pressure was better controlled and I felt that he would be a good candidate for a left radiocephalic AV fistula. The surgery was scheduled for 12/06/2013.  He tells me that the surgery was not scheduled as he was told he needed cardiac clearance and states that this has been done. He is not yet on dialysis. I have reviewed his notes from [facility]. He has a history of stage V chronic kidney disease and needs access. He does have a history of hypertension and secondary hyperparathyroidism. He does have a history of a cardiomyopathy.   Past Medical History  Diagnosis Date  . Enlarged heart   . Hypertension   . Morbid obesity   . Chronic kidney disease    Family History  Problem Relation Age of Onset  . Diabetes Mother   . Hypertension Mother   . Heart attack Mother 73  . Stroke Father   . Hypertension Father   . Depression Father   . Diabetes Brother   . Hypertension Brother   . Stroke Brother 48  . Alcohol abuse Brother   . Asthma Brother    SOCIAL HISTORY: History  Substance Use Topics  . Smoking status: Never Smoker   . Smokeless tobacco: Never Used  . Alcohol Use: No   No Known Allergies Current Outpatient Prescriptions  Medication Sig Dispense Refill  . amLODipine (NORVASC) 10 MG tablet TAKE 1 TABLET BY MOUTH AT BEDTIME  30 tablet  2  . calcitRIOL (ROCALTROL) 0.25 MCG capsule TAKE 1 CAPSULE BY MOUTH DAILY.  30 capsule  2  . furosemide (LASIX) 80 MG tablet Take 40 mg by mouth 2 (two) times daily.      . isosorbide mononitrate (IMDUR)  30 MG 24 hr tablet TAKE 1 TABLET BY MOUTH ONCE DAILY  30 tablet  2  . labetalol (NORMODYNE) 300 MG tablet TAKE 1 TABLET BY MOUTH 3 TIMES DAILY.  90 tablet  3  . furosemide (LASIX) 40 MG tablet TAKE 2 TABLETS BY MOUTH 2 TIMES DAILY.  120 tablet  2   No current facility-administered medications for this visit.   REVIEW OF SYSTEMS: Valu.Nieves ] denotes positive finding; [  ] denotes negative finding  CARDIOVASCULAR:  [ ]  chest pain   [ ]  chest pressure   [ ]  palpitations   [ ]  orthopnea   Valu.Nieves ] dyspnea on exertion   [ ]  claudication   [ ]  rest pain   [ ]  DVT   [ ]  phlebitis PULMONARY:   [ ]  productive cough   [ ]  asthma   [ ]  wheezing NEUROLOGIC:   [ ]  weakness  [ ]  paresthesias  [ ]  aphasia  [ ]  amaurosis  [ ]  dizziness HEMATOLOGIC:   [ ]  bleeding problems   [ ]  clotting disorders MUSCULOSKELETAL:  [ ]  joint pain   [ ]  joint swelling [ ]  leg swelling GASTROINTESTINAL: [ ]   blood in stool  [ ]   hematemesis GENITOURINARY:  [ ]   dysuria  [ ]   hematuria PSYCHIATRIC:  [ ]   history of major depression INTEGUMENTARY:  [ ]  rashes  [ ]  ulcers CONSTITUTIONAL:  [ ]  fever   [ ]  chills  PHYSICAL EXAM: Filed Vitals:   05/01/14 1158  BP: 172/113  Pulse: 86  Resp: 16  Height: 6\' 4"  (1.93 m)  Weight: 323 lb (146.512 kg)  SpO2: 96%   Body mass index is 39.33 kg/(m^2). GENERAL: The patient is a well-nourished male, in no acute distress. The vital signs are documented above. CARDIOVASCULAR: There is a regular rate and rhythm. He has a palpable radial pulse bilaterally. PULMONARY: There is good air exchange bilaterally without wheezing or rales. ABDOMEN: Soft and non-tender with normal pitched bowel sounds.  MUSCULOSKELETAL: There are no major deformities or cyanosis. NEUROLOGIC: No focal weakness or paresthesias are detected. SKIN: There are no ulcers or rashes noted. PSYCHIATRIC: The patient has a normal affect.  DATA:  I reviewed his previous vein map which shows that he may potentially have a usable  forearm cephalic vein on the left or ulcer on upper arm cephalic vein.  MEDICAL ISSUES: END-STAGE RENAL DISEASE: I have recommended placement of a left radiocephalic AV fistula or brachiocephalic AV fistula. He is taking a trip to [place] and would like to schedule this on 05/14/2014. We have reviewed the procedure and potential complications and he is agreeable to proceed.  [place] [facility] [place]: [phone]

## 2014-05-24 NOTE — Interval H&P Note (Signed)
History and Physical Interval Note:  05/24/2014 9:03 AM  Devin Bullock  has presented today for surgery, with the diagnosis of End-stage renal disease  The various methods of treatment have been discussed with the patient and family. After consideration of risks, benefits and other options for treatment, the patient has consented to  Procedure(s): BRACHIOCEPHALIC ARTERIOVENOUS (AV) FISTULA CREATION (Left) as a surgical intervention .  The patient's history has been reviewed, patient examined, no change in status, stable for surgery.  I have reviewed the patient's chart and labs.  Questions were answered to the patient's satisfaction.     Eugena Rhue S

## 2014-05-27 ENCOUNTER — Encounter (HOSPITAL_COMMUNITY): Payer: Self-pay | Admitting: Vascular Surgery

## 2014-05-28 ENCOUNTER — Other Ambulatory Visit: Payer: Self-pay | Admitting: Internal Medicine

## 2014-06-04 ENCOUNTER — Other Ambulatory Visit: Payer: Self-pay | Admitting: Internal Medicine

## 2014-06-05 ENCOUNTER — Telehealth: Payer: Self-pay | Admitting: Internal Medicine

## 2014-06-05 NOTE — Telephone Encounter (Signed)
Pt. Called to request a refill for calcitRIOL (ROCALTROL) 0.25 MCG capsule, pt. Would like enough medication to last until next OV. Please f/u with pt.

## 2014-06-07 NOTE — Telephone Encounter (Signed)
Pt was last in clinic 01/11/14   Status: Signed        Pt. Called to request a refill for calcitRIOL (ROCALTROL) 0.25 MCG capsule, pt. Would like enough medication to last until next OV. Please f/u with pt.

## 2014-06-13 ENCOUNTER — Ambulatory Visit: Payer: Self-pay | Admitting: Internal Medicine

## 2014-06-13 ENCOUNTER — Encounter: Payer: Self-pay | Admitting: Internal Medicine

## 2014-06-13 ENCOUNTER — Ambulatory Visit: Payer: Self-pay | Attending: Internal Medicine | Admitting: Internal Medicine

## 2014-06-13 VITALS — BP 168/111 | HR 88 | Temp 98.2°F | Resp 18 | Ht 76.0 in | Wt 328.0 lb

## 2014-06-13 DIAGNOSIS — N186 End stage renal disease: Secondary | ICD-10-CM

## 2014-06-13 DIAGNOSIS — Z992 Dependence on renal dialysis: Secondary | ICD-10-CM | POA: Insufficient documentation

## 2014-06-13 DIAGNOSIS — I120 Hypertensive chronic kidney disease with stage 5 chronic kidney disease or end stage renal disease: Secondary | ICD-10-CM | POA: Insufficient documentation

## 2014-06-13 DIAGNOSIS — I151 Hypertension secondary to other renal disorders: Secondary | ICD-10-CM

## 2014-06-13 DIAGNOSIS — N2889 Other specified disorders of kidney and ureter: Secondary | ICD-10-CM

## 2014-06-13 MED ORDER — ISOSORBIDE MONONITRATE ER 30 MG PO TB24: 30.0000 mg | ORAL_TABLET | Freq: Every day | ORAL | Status: DC

## 2014-06-13 MED ORDER — LABETALOL HCL 300 MG PO TABS: 300.0000 mg | ORAL_TABLET | Freq: Three times a day (TID) | ORAL | Status: DC

## 2014-06-13 MED ORDER — FUROSEMIDE 80 MG PO TABS: 80.0000 mg | ORAL_TABLET | Freq: Two times a day (BID) | ORAL | Status: DC

## 2014-06-13 MED ORDER — AMLODIPINE BESYLATE 10 MG PO TABS: 10.0000 mg | ORAL_TABLET | Freq: Every day | ORAL | Status: DC

## 2014-06-13 MED ORDER — CALCITRIOL 0.25 MCG PO CAPS: 0.2500 ug | ORAL_CAPSULE | Freq: Every day | ORAL | Status: DC

## 2014-06-13 NOTE — Progress Notes (Signed)
F/U HTN  Medicine Refills Not taking BP Medication x1wk Pt stated do not smoke or drink

## 2014-06-13 NOTE — Progress Notes (Signed)
Patient ID: Devin Bullock, male   DOB: 02-08-1966, 48 y.o.   MRN: [id number]  CC:  HTN  HPI:  Patient presents to clinic to follow up on his HTN.  He states that he had a dialysis fistula placed on left forearm two weeks ago.  Patient reports that he will be set up for dialysis once his fistula matures.  He has a follow up with the surgeon next month.  He does make urine.  He states that he has been out of all his medications for the past 2 weeks.  He states that he was taking his medication on a daily basis as prescribed.  He has had a cough for the past two weeks and has been taking hycoden.  He denies fever, chills, rhinitis, or facial pressure.  He would like to visit a dietician to help with diet and energy.    No Known Allergies Past Medical History  Diagnosis Date  . Enlarged heart   . Hypertension   . Morbid obesity   . Chronic kidney disease    Current Outpatient Prescriptions on File Prior to Visit  Medication Sig Dispense Refill  . amLODipine (NORVASC) 10 MG tablet Take 10 mg by mouth daily.      . calcitRIOL (ROCALTROL) 0.25 MCG capsule Take 0.25 mcg by mouth daily.      . furosemide (LASIX) 80 MG tablet Take 80-160 mg by mouth 2 (two) times daily. Take 160 mg in the am & 80 mg in the pm      . isosorbide mononitrate (IMDUR) 30 MG 24 hr tablet Take 30 mg by mouth daily.      Marland Kitchen labetalol (NORMODYNE) 300 MG tablet Take 300 mg by mouth 3 (three) times daily.      Marland Kitchen oxyCODONE (ROXICODONE) 5 MG immediate release tablet Take 1 tablet (5 mg total) by mouth every 6 (six) hours as needed for severe pain.  20 tablet  0   Current Facility-Administered Medications on File Prior to Visit  Medication Dose Route Frequency Provider Last Rate Last Dose  . 0.9 %  sodium chloride infusion   Intravenous Continuous Angelia Mould, MD       Family History  Problem Relation Age of Onset  . Diabetes Mother   . Hypertension Mother   . Heart attack Mother 18  . Stroke Father   . Hypertension  Father   . Depression Father   . Diabetes Brother   . Hypertension Brother   . Stroke Brother 48  . Alcohol abuse Brother   . Asthma Brother    History   Social History  . Marital Status: Single    Spouse Name: N/A    Number of Children: 2  . Years of Education: N/A   Occupational History  . Not on file.   Social History Main Topics  . Smoking status: Never Smoker   . Smokeless tobacco: Never Used  . Alcohol Use: No  . Drug Use: No  . Sexual Activity: No   Other Topics Concern  . Not on file   Social History Narrative   Lives alone.    Review of Systems  Eyes: Negative.   Cardiovascular: Negative.   Neurological: Negative for dizziness and headaches.  All other systems reviewed and are negative.      Objective:   Filed Vitals:   06/13/14 1651  BP: 168/111  Pulse: 88  Temp: 98.2 F (36.8 C)  Resp: 18    Physical Exam: Constitutional:  Patient appears well-developed and well-nourished. No distress. HENT: Normocephalic, atraumatic, External right and left ear normal. Oropharynx is clear and moist.  Eyes: Conjunctivae and EOM are normal. PERRLA, no scleral icterus. Neck: Normal ROM. Neck supple. No JVD. No tracheal deviation. No thyromegaly. CVS: RRR, S1/S2 +, no murmurs, no gallops, no carotid bruit.  Pulmonary: Effort and breath sounds normal, no stridor, rhonchi, wheezes, rales.  Abdominal: Soft. BS +,  no distension, tenderness, rebound or guarding.  Musculoskeletal: Normal range of motion. No edema and no tenderness.  Lymphadenopathy: No lymphadenopathy noted, cervical, inguinal or axillary Neuro: Alert. Normal reflexes, muscle tone coordination. No cranial nerve deficit. Skin: Skin is warm and dry. No rash noted. Not diaphoretic. No erythema. No pallor. Psychiatric: Normal mood and affect. Behavior, judgment, thought content normal.  Lab Results  Component Value Date   WBC 4.0 11/01/2013   HGB 13.3 05/24/2014   HCT 39.0 05/24/2014   MCV 84.0  11/01/2013   PLT 175 11/01/2013   Lab Results  Component Value Date   CREATININE 3.67* 03/26/2014   BUN 37* 03/26/2014   NA 143 05/24/2014   K 3.1* 05/24/2014   CL 103 03/26/2014   CO2 27 03/26/2014    Lab Results  Component Value Date   HGBA1C 5.5 11/01/2013   Lipid Panel     Component Value Date/Time   CHOL 184 11/01/2013 1649   TRIG 115 11/01/2013 1649   HDL 40 11/01/2013 1649   CHOLHDL 4.6 11/01/2013 1649   VLDL 23 11/01/2013 1649   LDLCALC 121* 11/01/2013 1649       Assessment and plan:   Cubby was seen today for follow-up.  Diagnoses and associated orders for this visit:  Hypertension secondary to other renal disorders - amLODipine (NORVASC) 10 MG tablet; Take 1 tablet (10 mg total) by mouth daily. - furosemide (LASIX) 80 MG tablet; Take 1 tablet (80 mg total) by mouth 2 (two) times daily. Take 160 mg in the am & 80 mg in the pm - isosorbide mononitrate (IMDUR) 30 MG 24 hr tablet; Take 1 tablet (30 mg total) by mouth daily. - labetalol (NORMODYNE) 300 MG tablet; Take 1 tablet (300 mg total) by mouth 3 (three) times daily. - COMPLETE METABOLIC PANEL WITH GFR  Explained to patient diet modifications that may contribute to elevated BP. Patient will avoid foods that are high in sodium such as  canned soups and vegetables, tomato juice, commercial baked goods, commercially prepared frozen or canned entrees and sauces. Avoid salty snacks, added salt when cooking, and substituting for low sodium herbs or spices Explained exercise regimen of cardio at least three times weekly to help lower BP and cholesterol  ESRD (end stage renal disease) - calcitRIOL (ROCALTROL) 0.25 MCG capsule; Take 1 capsule (0.25 mcg total) by mouth daily. - Ambulatory referral to Nutrition and Diabetic Education Continue follow up with Nephrology and vascular surgery   Return in about 1 week (around 06/20/2014) for Nurse Visit-BP check/flu shot and 3 mo Pcp.       Chari Manning, NP-C [facility] and  Wellness [phone] 06/13/2014, 5:09 PM

## 2014-06-13 NOTE — Patient Instructions (Signed)

## 2014-06-14 LAB — COMPLETE METABOLIC PANEL WITH GFR
ALBUMIN: 4.3 g/dL (ref 3.5–5.2)
ALK PHOS: 108 U/L (ref 39–117)
ALT: 10 U/L (ref 0–53)
AST: 17 U/L (ref 0–37)
BUN: 34 mg/dL — AB (ref 6–23)
CHLORIDE: 103 meq/L (ref 96–112)
CO2: 22 mEq/L (ref 19–32)
Calcium: 8.7 mg/dL (ref 8.4–10.5)
Creat: 4.03 mg/dL — ABNORMAL HIGH (ref 0.50–1.35)
GFR, Est African American: 19 mL/min — ABNORMAL LOW
GFR, Est Non African American: 16 mL/min — ABNORMAL LOW
Glucose, Bld: 92 mg/dL (ref 70–99)
POTASSIUM: 4 meq/L (ref 3.5–5.3)
SODIUM: 139 meq/L (ref 135–145)
Total Bilirubin: 0.5 mg/dL (ref 0.2–1.2)
Total Protein: 7.3 g/dL (ref 6.0–8.3)

## 2014-06-21 ENCOUNTER — Telehealth: Payer: Self-pay | Admitting: *Deleted

## 2014-06-21 NOTE — Telephone Encounter (Signed)
-----   Message from Lance Bosch, NP sent at 06/18/2014  9:22 PM EST ----- Call patient and let him know that his kidney function is still poor but his potassium is normal. Find out when patient is scheduled to start dialysis

## 2014-06-21 NOTE — Telephone Encounter (Signed)
Pt aware of lab results. Stated has appointment  Appointment November 11.  Waiting for fistula to settle in

## 2014-07-02 ENCOUNTER — Encounter: Payer: Self-pay | Admitting: Vascular Surgery

## 2014-07-03 ENCOUNTER — Encounter: Payer: Self-pay | Admitting: Vascular Surgery

## 2014-07-03 ENCOUNTER — Ambulatory Visit (HOSPITAL_COMMUNITY)
Admission: RE | Admit: 2014-07-03 | Discharge: 2014-07-03 | Disposition: A | Discharge status: Home / Self Care | Payer: Self-pay | Source: Ambulatory Visit | Attending: Vascular Surgery | Admitting: Vascular Surgery

## 2014-07-03 ENCOUNTER — Other Ambulatory Visit: Payer: Self-pay | Admitting: Vascular Surgery

## 2014-07-03 ENCOUNTER — Ambulatory Visit (INDEPENDENT_AMBULATORY_CARE_PROVIDER_SITE_OTHER): Payer: Self-pay | Admitting: Vascular Surgery

## 2014-07-03 VITALS — BP 156/90 | HR 86 | Resp 18 | Ht 74.25 in | Wt 330.8 lb

## 2014-07-03 DIAGNOSIS — N189 Chronic kidney disease, unspecified: Secondary | ICD-10-CM | POA: Insufficient documentation

## 2014-07-03 DIAGNOSIS — N184 Chronic kidney disease, stage 4 (severe): Secondary | ICD-10-CM

## 2014-07-03 DIAGNOSIS — Z4931 Encounter for adequacy testing for hemodialysis: Secondary | ICD-10-CM

## 2014-07-03 DIAGNOSIS — N186 End stage renal disease: Secondary | ICD-10-CM

## 2014-07-03 HISTORY — DX: Chronic kidney disease, unspecified: N18.9

## 2014-07-03 NOTE — Progress Notes (Signed)
   Patient name: Devin Bullock MRN: [id number] DOB: 22-Oct-1965 Sex: male  REASON FOR VISIT: follow up after placement of left upper extremity AV fistula  HPI: Devin Bullock is a 48 y.o. male who is not yet on dialysis. He had a left radiocephalic AV fistula placed on 05/24/2014. He comes in for a 6 week follow up visit. He denies any pain or paresthesias in the left upper extremity. He has no specific complaints.  REVIEW OF SYSTEMS: Valu.Nieves ] denotes positive finding; [  ] denotes negative finding  CARDIOVASCULAR:  [ ]  chest pain   [ ]  dyspnea on exertion    CONSTITUTIONAL:  [ ]  fever   [ ]  chills  PHYSICAL EXAM: Filed Vitals:   07/03/14 1620  BP: 156/90  Pulse: 86  Resp: 18  Height: 6' 2.25" (1.886 m)  Weight: 330 lb 12.8 oz (150.05 kg)   Body mass index is 42.18 kg/(m^2). GENERAL: The patient is a well-nourished male, in no acute distress. The vital signs are documented above. CARDIOVASCULAR: There is a regular rate and rhythm. PULMONARY: There is good air exchange bilaterally without wheezing or rales. He has an excellent bruit and thrill in his left radiocephalic AV fistula.  DUPLEX: Duplex of his fistula today shows that the diameters of the fistula range from 0.70 cm to 0.71 cm. Thus the fistula appears to be maturing nicely. There are 3 branches which are identified. There are also some elevated velocities at the proximal anastomosis which is not unusual.  MEDICAL ISSUES:  Chronic kidney disease (CKD), stage IV (severe) Based on his exam the fistula appears to be maturing adequately and should be ready for dialysis and proximally 6 weeks. I do not think that the branches seen on the duplex scan are significant at this point but obviously these could gradually enlarge and become problematic in the future. I'll plan on seeing him back as needed.    Ken Caryl [facility] Beeper: [phone]

## 2014-07-03 NOTE — Assessment & Plan Note (Signed)
Based on his exam the fistula appears to be maturing adequately and should be ready for dialysis and proximally 6 weeks. I do not think that the branches seen on the duplex scan are significant at this point but obviously these could gradually enlarge and become problematic in the future. I'll plan on seeing him back as needed.

## 2014-07-05 ENCOUNTER — Encounter: Payer: Self-pay | Admitting: Skilled Nursing Facility1

## 2014-07-05 ENCOUNTER — Encounter: Payer: Self-pay | Attending: Internal Medicine | Admitting: Skilled Nursing Facility1

## 2014-07-05 VITALS — Ht 75.0 in | Wt 329.8 lb

## 2014-07-05 DIAGNOSIS — Z713 Dietary counseling and surveillance: Secondary | ICD-10-CM | POA: Insufficient documentation

## 2014-07-05 DIAGNOSIS — Z6841 Body Mass Index (BMI) 40.0 and over, adult: Secondary | ICD-10-CM | POA: Insufficient documentation

## 2014-07-05 DIAGNOSIS — E663 Overweight: Secondary | ICD-10-CM | POA: Insufficient documentation

## 2014-07-05 NOTE — Patient Instructions (Addendum)
-  Nutrients to watch: Sodium                                  Phosphorus                                  Calcium                                  Potassium -Look for "Phos" on nutrition label -2.4 grams = 2400 milligrams (mg)  -89.7-119.6 grams of protein per day; (7 grams of protein per serving of tuna/meat/nuts) -1 gram = 1000 mg -You are already doing really well with your diet so do NOT worry! -2-3 handfuls of nuts -Find what makes you happy and Do what makes you happy everyday!  -A hobby will help to relieve stress -Stick to white bread verses wheat (including pastas) -Try to go walking and get started working out again

## 2014-07-05 NOTE — Progress Notes (Signed)
Medical Nutrition Therapy:  Appt start time: 0800 end time:  0900.   Assessment:  Primary concerns today: Patient was Referred for Overweight with ESRD. The patient wants to know what not to eat in regards to his end stage renal disease. Patient reports having stopped eating meat and fried foods. Patient has stopped eating meat due to the documentary [employer] over [employer]. Patient reports he is still eating tuna. Patient wants to preserve what little kidney function he has left. Patient states he is very disciplined and will do whatever it takes; he wants specific grams and specific numbers. Patient has just gotten a fistula in the left forearm a month ago. The patient reports he will be speaking with his nephrologist  about dialysis but has not started dialysis yet. He has not had any formal information on a renal diet or a renal protective diet. The Patient states he has read some infromation on the internet. The patient reports having no energy; he used to work out but 5-6 months ago has had no energy to work out. Patient has had a history of hypertension. Patient states he was first diagnosed with kidney disorder in October of last year. He went to the doctor due to edema in the ankles last year. Patient reports having no history of drinking or smoking. Patient reports having changed his eating habits after the visit to the hospital with edema.              Preferred Learning Style:  Auditory  Learning Readiness:  Change in progress   MEDICATIONS: Furosemide, Calcitriol   DIETARY INTAKE:  Usual eating pattern includes 3 meals and 2-3 snacks per day.  Everyday foods include 3.  Avoided foods include meats.    24-hr recall:  B ( AM): habiscus tea in a can with fresh ginger and fruit smoothie (blueberries, grapes, strawberries) Snk ( AM): handful of nuts  L ( PM): tuna fish sandwhich with no added mayonnaise Snk ( PM): handful of nuts D ( PM): mushroom pizza (2 slices)-delivery  Snk ( PM):  handful of nuts Beverages: water, sugar free orange juice, sugar free cranberry juice, habiscus tea from a can with fresh ginger  Usual physical activity:  ADL's  Estimated energy needs: 2200 calories 248 g carbohydrates 165 g protein 61 g fat  Progress Towards Goal(s):  In progress.   Nutritional Diagnosis:  NB-1.1 Food and nutrition-related knowledge deficit As related to new diagnosis of ESRD.  As evidenced by patient report of not knowing what phosphorus was or any of the nutrition recommendations for ESRD.    Intervention:  Nutrition Counseling for overweight/ESRD. Dietitian educated patient on ESRD and the nutrition recommendations involved. Dietitian also educated the patient on the link between ESRD and weight gain due to fluid accumulation. Dietitian also explained what dialysis was and how it works.    Goals:   -Nutrients to watch: Sodium                                  Phosphorus                                  Calcium                                  Potassium -Look  for "Phos" on nutrition label -2.4 grams = 2400 milligrams (mg)  -89.7-119.6 grams of protein per day; (7 grams of protein per serving of tuna/meat/nuts) -1 gram = 1000 mg -You are already doing really well with your diet so do NOT worry! -2-3 handfuls of nuts -Find what makes you happy and Do what makes you happy everyday!  -A hobby will help to relieve stress -Stick to white bread verses wheat (including pastas) -Try to go walking and get started working out again   Teaching Method Utilized:   Auditory   Handouts given during visit include:  Nutrition Care Manual ESRD tips and information  Renal patients on dialysis booklet  Barriers to learning/adherence to lifestyle change: the patient has no support system in [place] and does not feel his family listens to him (located in [place])  Demonstrated degree of understanding via:  Teach Back   Monitoring/Evaluation:  Dietary intake,  exercise, nutrients of concern, and body weight prn.

## 2014-08-06 ENCOUNTER — Encounter: Payer: Self-pay | Admitting: Internal Medicine

## 2014-08-06 ENCOUNTER — Ambulatory Visit: Payer: Self-pay | Attending: Internal Medicine | Admitting: Internal Medicine

## 2014-08-06 VITALS — BP 143/88 | HR 88 | Temp 98.5°F | Resp 16 | Ht 76.0 in | Wt 330.0 lb

## 2014-08-06 DIAGNOSIS — I10 Essential (primary) hypertension: Secondary | ICD-10-CM | POA: Insufficient documentation

## 2014-08-06 DIAGNOSIS — E669 Obesity, unspecified: Secondary | ICD-10-CM | POA: Insufficient documentation

## 2014-08-06 DIAGNOSIS — I5043 Acute on chronic combined systolic (congestive) and diastolic (congestive) heart failure: Secondary | ICD-10-CM

## 2014-08-06 DIAGNOSIS — N184 Chronic kidney disease, stage 4 (severe): Secondary | ICD-10-CM

## 2014-08-06 DIAGNOSIS — N529 Male erectile dysfunction, unspecified: Secondary | ICD-10-CM

## 2014-08-06 MED ORDER — SILDENAFIL CITRATE 25 MG PO TABS
25.0000 mg | ORAL_TABLET | Freq: Every day | ORAL | Status: DC | PRN
Start: 2014-08-06 — End: 2020-04-02

## 2014-08-06 NOTE — Progress Notes (Signed)
Pt requested Rx Viagra  Stated been out of BP medication for a couple of days

## 2014-08-06 NOTE — Progress Notes (Signed)
CC:  HPI: Devin Bullock is a 48 y.o. male here today for a follow up visit.  Patient has past medical history of hypertension, obesity, and CKD.  He presents today only for evaluation of erectile dysfunction.  Patient reports that he sometimes is able to get an erection but more often has difficulty maintaining his erection. He reports that he wants a healthy sex life and he is now unable to have that due to difficulty with erections.  He reports that when he is able to maintain a erection he suffers from early ejaculation.   Refuses cmp level today because he reports that he just had blood work at his Nephrology office.  He will have office visit with blood work faxed over for review.  Patient has No headache, No chest pain, No abdominal pain - No Nausea, No new weakness tingling or numbness, No Cough - SOB.  No Known Allergies Past Medical History  Diagnosis Date  . Enlarged heart   . Hypertension   . Morbid obesity   . Chronic kidney disease    Current Outpatient Prescriptions on File Prior to Visit  Medication Sig Dispense Refill  . amLODipine (NORVASC) 10 MG tablet Take 1 tablet (10 mg total) by mouth daily. 30 tablet 3  . calcitRIOL (ROCALTROL) 0.25 MCG capsule Take 1 capsule (0.25 mcg total) by mouth daily. 30 capsule 3  . furosemide (LASIX) 80 MG tablet Take 1 tablet (80 mg total) by mouth 2 (two) times daily. Take 160 mg in the am & 80 mg in the pm 60 tablet 3  . isosorbide mononitrate (IMDUR) 30 MG 24 hr tablet Take 1 tablet (30 mg total) by mouth daily. 30 tablet 3  . labetalol (NORMODYNE) 300 MG tablet Take 1 tablet (300 mg total) by mouth 3 (three) times daily. 90 tablet 3  . oxyCODONE (ROXICODONE) 5 MG immediate release tablet Take 1 tablet (5 mg total) by mouth every 6 (six) hours as needed for severe pain. (Patient not taking: Reported on 08/06/2014) 20 tablet 0   Current Facility-Administered Medications on File Prior to Visit  Medication Dose Route Frequency Provider Last  Rate Last Dose  . 0.9 %  sodium chloride infusion   Intravenous Continuous Angelia Mould, MD       Family History  Problem Relation Age of Onset  . Diabetes Mother   . Hypertension Mother   . Heart attack Mother 32  . Stroke Father   . Hypertension Father   . Depression Father   . Diabetes Brother   . Hypertension Brother   . Stroke Brother 48  . Alcohol abuse Brother   . Asthma Brother    History   Social History  . Marital Status: Single    Spouse Name: N/A    Number of Children: 2  . Years of Education: N/A   Occupational History  . Not on file.   Social History Main Topics  . Smoking status: Never Smoker   . Smokeless tobacco: Never Used  . Alcohol Use: No  . Drug Use: No  . Sexual Activity: No   Other Topics Concern  . Not on file   Social History Narrative   Lives alone.     Review of Systems: See HPI   Objective:   Filed Vitals:   08/06/14 1716  BP: 170/108  Pulse:   Temp:   Resp:     Physical Exam  Cardiovascular: Normal rate, regular rhythm and normal heart sounds.   Pulmonary/Chest:  Effort normal and breath sounds normal.     Lab Results  Component Value Date   WBC 4.0 11/01/2013   HGB 13.3 05/24/2014   HCT 39.0 05/24/2014   MCV 84.0 11/01/2013   PLT 175 11/01/2013   Lab Results  Component Value Date   CREATININE 4.03* 06/13/2014   BUN 34* 06/13/2014   NA 139 06/13/2014   K 4.0 06/13/2014   CL 103 06/13/2014   CO2 22 06/13/2014    Lab Results  Component Value Date   HGBA1C 5.5 11/01/2013   Lipid Panel     Component Value Date/Time   CHOL 184 11/01/2013 1649   TRIG 115 11/01/2013 1649   HDL 40 11/01/2013 1649   CHOLHDL 4.6 11/01/2013 1649   VLDL 23 11/01/2013 1649   LDLCALC 121* 11/01/2013 1649       Assessment and plan:   Freedom was seen today for medication refill.  Diagnoses and associated orders for this visit:  Chronic kidney disease (CKD), stage IV (severe) - COMPLETE METABOLIC PANEL WITH  GFR---patient refused to have blood work today  Acute on chronic combined systolic and diastolic CHF (congestive heart failure) - Ambulatory referral to Cardiology  Erectile dysfunction, unspecified erectile dysfunction type - sildenafil (VIAGRA) 25 MG tablet; Take 1 tablet (25 mg total) by mouth daily as needed for erectile dysfunction. Stressed to patient that he must wait at least 4-6 hours in b/w taking viagra and isosorboide.  This case was discussed with Dr. Doreene Burke who states that it is ok to prescribe viagra but I must explain to patient in detail the risk of death if not taken correctly.  Dr. Doreene Burke assured me that this is safe to do if patient agrees to take correctly.  Patient verbalized understanding and agreed, with teach back. Patient reports that he rather skip his isosorbide on the days that he takes viagra. Patient became very persistent about receiving medication during exam.  Follow up in 3 months      Chari Manning, [facility] [phone] 08/06/2014, 6:04 PM

## 2014-08-06 NOTE — Patient Instructions (Signed)
Must wait at least 4 hours apart with isosorbide and viagra

## 2014-09-09 ENCOUNTER — Institutional Professional Consult (permissible substitution): Payer: Self-pay | Admitting: Internal Medicine

## 2014-10-07 ENCOUNTER — Institutional Professional Consult (permissible substitution): Payer: Self-pay | Admitting: Internal Medicine

## 2014-10-15 ENCOUNTER — Encounter: Payer: Self-pay | Admitting: Internal Medicine

## 2014-10-28 ENCOUNTER — Other Ambulatory Visit: Payer: Self-pay | Admitting: Internal Medicine

## 2014-12-05 ENCOUNTER — Other Ambulatory Visit: Payer: Self-pay | Admitting: Internal Medicine

## 2014-12-30 ENCOUNTER — Other Ambulatory Visit: Payer: Self-pay | Admitting: Internal Medicine

## 2015-03-31 ENCOUNTER — Telehealth: Payer: Self-pay | Admitting: Internal Medicine

## 2015-03-31 ENCOUNTER — Other Ambulatory Visit: Payer: Self-pay | Admitting: Internal Medicine

## 2015-03-31 NOTE — Telephone Encounter (Signed)
Pt. Is calling requesting medication refill.Marland KitchenMarland KitchenMarland KitchenMarland Kitchenplease send refill to [facility]........amLODipine (NORVASC) 10 MG tablet [id number]

## 2015-03-31 NOTE — Telephone Encounter (Signed)
Patient is overdue for 3 month f/u with PCP (Due 11/05/14) Will refill for 30 days. Please have patient schedule f/u appt with PCP

## 2015-04-01 ENCOUNTER — Encounter: Payer: Self-pay | Admitting: Pharmacist

## 2015-04-19 IMAGING — CR DG CHEST 2V
2 series · 2 of 2 positions shown · non-contrast
Comparison: [DATE]

CLINICAL DATA: Shortness of breath. Bilateral lower extremity
swelling.

EXAM:
CHEST  2 VIEW

[w chest pa *]
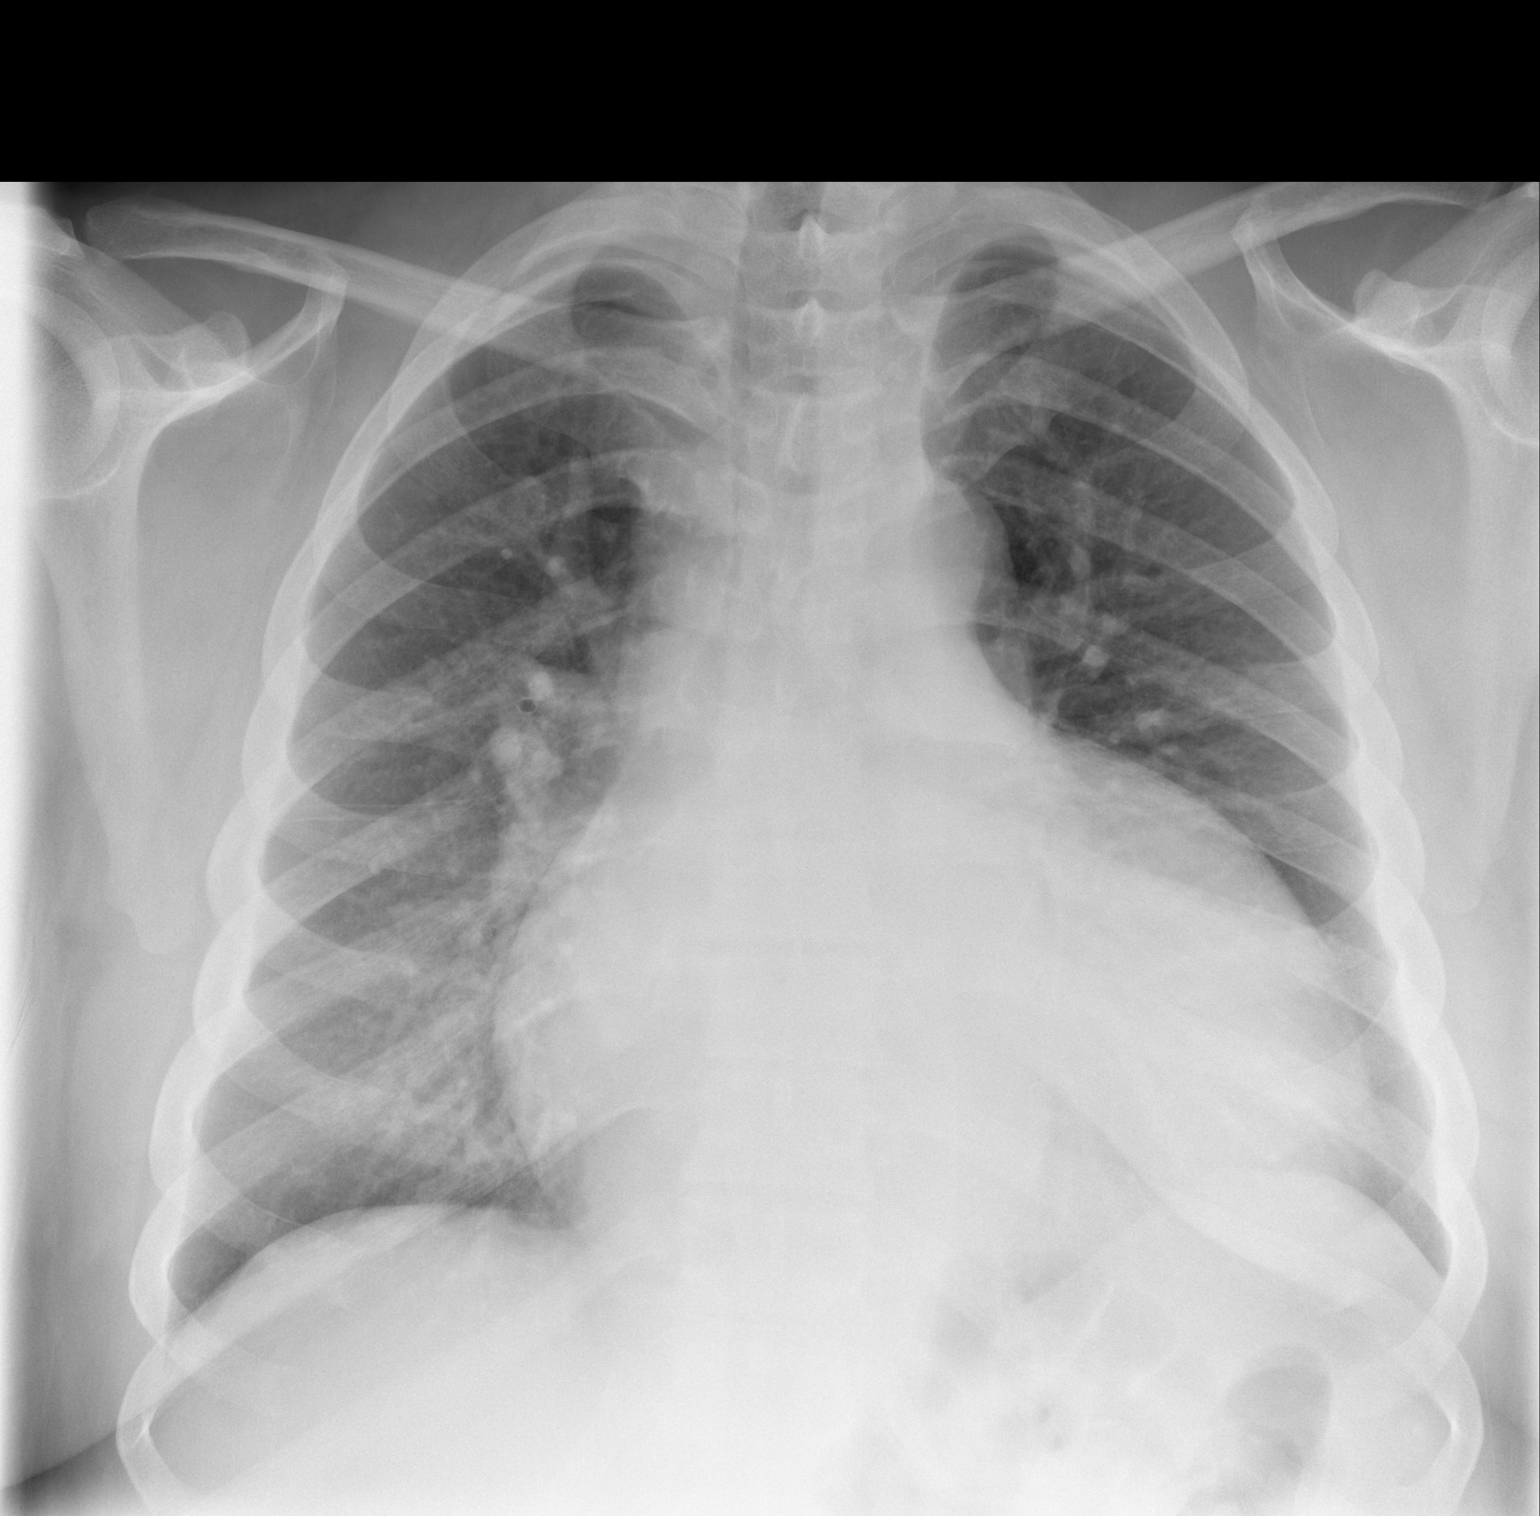

[w chest lat *]
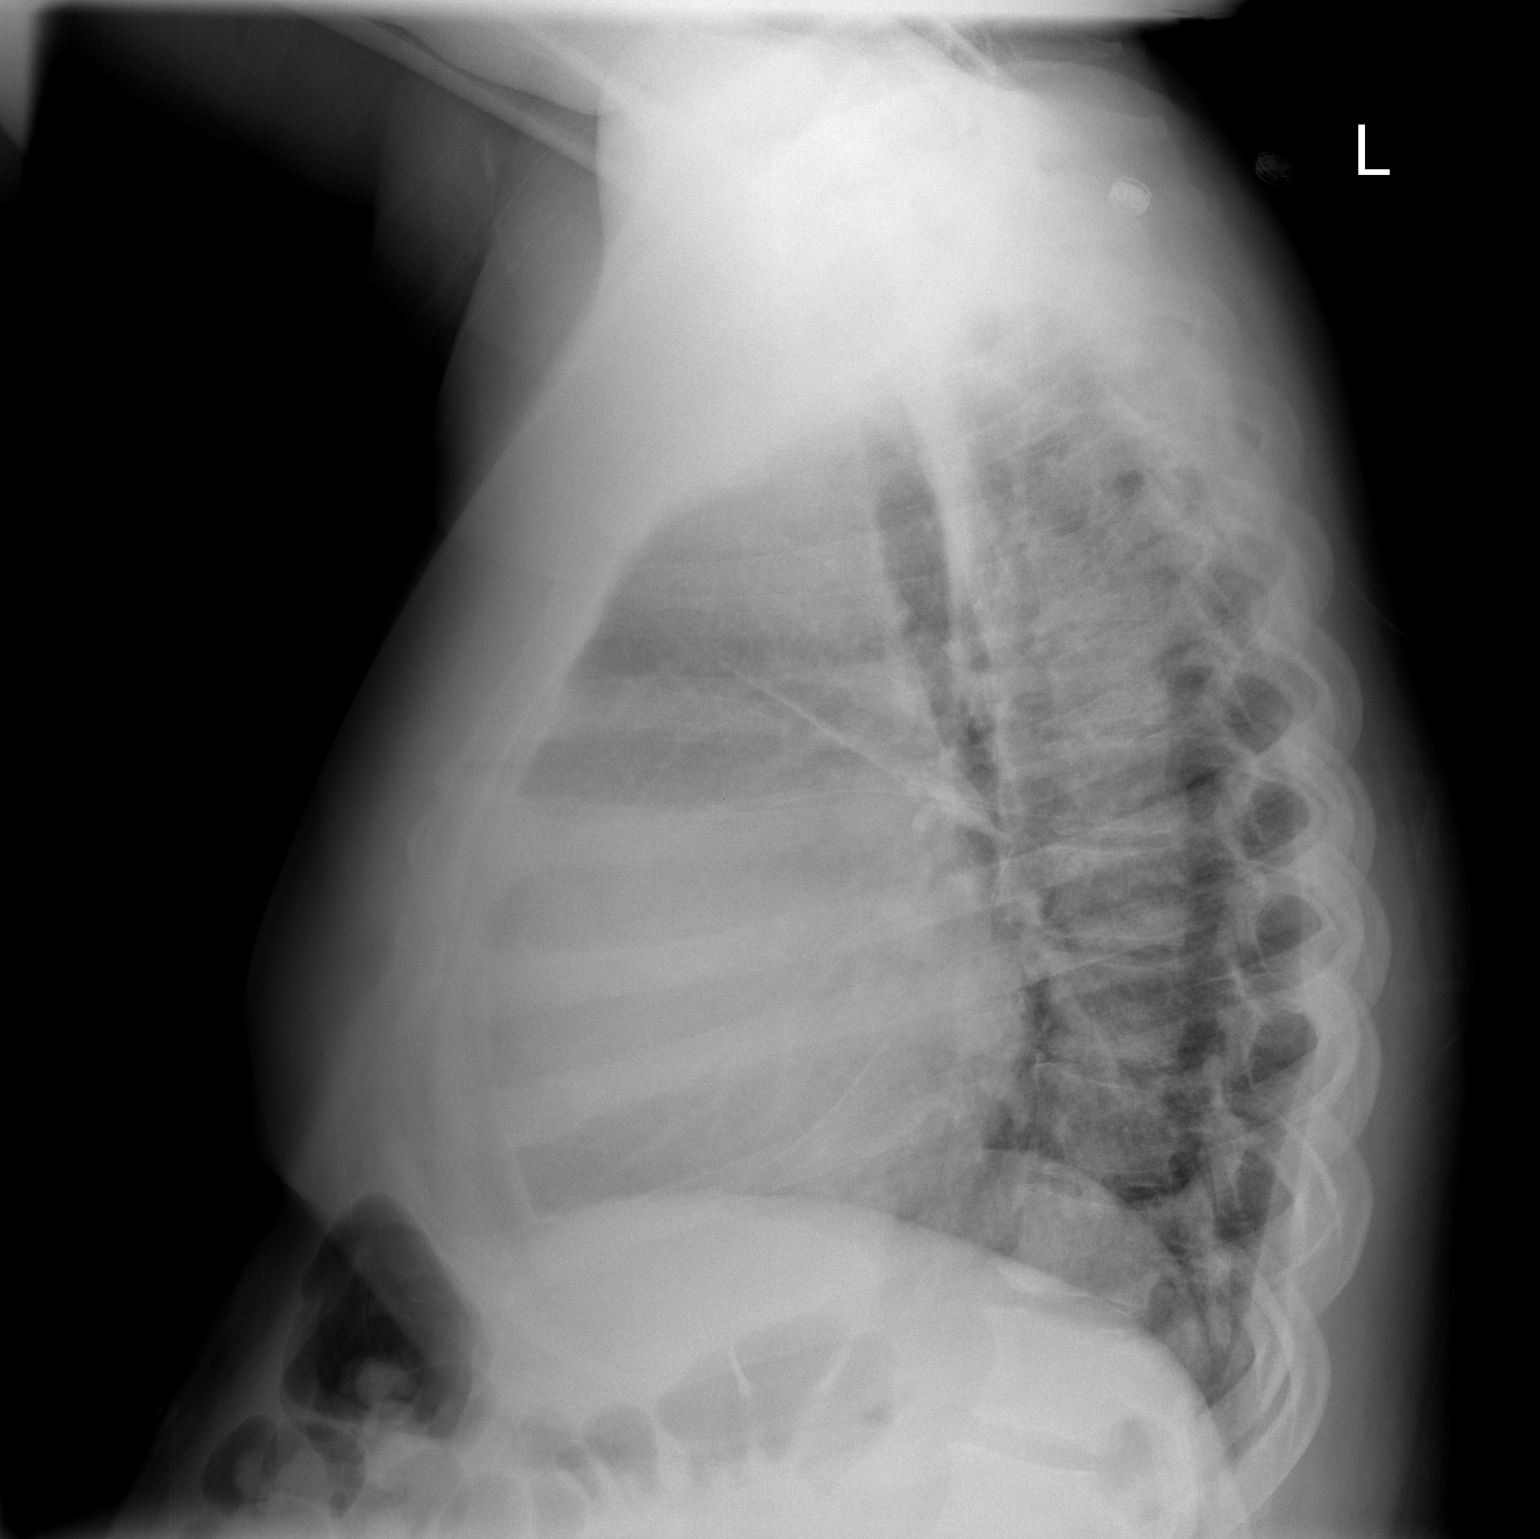

[2 of 2 positions shown; findings below may reference images not displayed]

FINDINGS: Cardiomegaly with vascular congestion. No overt edema. No focal
opacities or effusions. No acute bony abnormality.
IMPRESSION: Cardiomegaly, vascular congestion.

## 2015-05-29 ENCOUNTER — Other Ambulatory Visit: Payer: Self-pay | Admitting: Internal Medicine

## 2015-05-29 NOTE — Telephone Encounter (Signed)
Nurse called patient, patient verified date of birth. Patient reports he does not need any refills at this time. He picked all medications up from pharmacy earlier today.

## 2015-05-29 NOTE — Telephone Encounter (Signed)
Pt. Called requesting a med refill on calcitRIOL (ROCALTROL) 0.25 MCG capsule and his Potasium pill. Please f/u with pt.

## 2015-07-22 DIAGNOSIS — Z0279 Encounter for issue of other medical certificate: Secondary | ICD-10-CM | POA: Diagnosis not present

## 2015-09-02 MED FILL — AMLODIPINE BESYLATE 10 MG T: 10 | 31 days supply | Qty: 31 | Fill #3

## 2015-09-03 MED FILL — LABETALOL HCL 300 MG TABLET: 300 | 30 days supply | Qty: 90 | Fill #2

## 2015-09-30 MED FILL — CALCITRIOL 0.25 MCG CAPSULE: 0.25 | 30 days supply | Qty: 60 | Fill #0

## 2015-09-30 MED FILL — FUROSEMIDE 80 MG TABLET: 80 | 30 days supply | Qty: 60 | Fill #3

## 2015-09-30 MED FILL — AMLODIPINE BESYLATE 10 MG T: 10 | 31 days supply | Qty: 31 | Fill #4

## 2015-10-13 MED FILL — POTASSIUM CL ER 20 MEQ TAB: 20 | 4 days supply | Qty: 8 | Fill #0

## 2015-11-07 MED FILL — CALCITRIOL 0.25 MCG CAPSULE: 0.25 | 30 days supply | Qty: 60 | Fill #0

## 2015-11-07 MED FILL — AMLODIPINE BESYLATE 10 MG T: 10 | 31 days supply | Qty: 31 | Fill #5

## 2015-11-07 MED FILL — LABETALOL HCL 300 MG TABLET: 300 | 30 days supply | Qty: 90 | Fill #3

## 2015-11-12 MED FILL — FUROSEMIDE 80 MG TABLET: 80 | 30 days supply | Qty: 60 | Fill #4

## 2015-12-05 MED FILL — CALCITRIOL 0.25 MCG CAPSULE: 0.25 | 30 days supply | Qty: 90 | Fill #0

## 2015-12-05 MED FILL — POTASSIUM CL ER 20 MEQ TAB: 20 | 3 days supply | Qty: 6 | Fill #0

## 2015-12-18 MED FILL — FUROSEMIDE 80 MG TABLET: 80 | 30 days supply | Qty: 60 | Fill #5

## 2015-12-18 MED FILL — AMLODIPINE BESYLATE 10 MG T: 10 | 31 days supply | Qty: 31 | Fill #6

## 2016-01-01 MED FILL — LABETALOL HCL 300 MG TABLET: 300 | 30 days supply | Qty: 90 | Fill #4

## 2016-01-14 MED FILL — AMLODIPINE BESYLATE 10 MG T: 10 | 31 days supply | Qty: 31 | Fill #0

## 2016-01-14 MED FILL — POTASSIUM CL ER 20 MEQ TAB: 20 | 3 days supply | Qty: 6 | Fill #0

## 2016-01-16 MED FILL — CALCIUM ACETATE 667 MG CAP: 667 | 30 days supply | Qty: 90 | Fill #0

## 2016-01-22 MED FILL — CALCITRIOL 0.25 MCG CAPSULE: 0.25 | 30 days supply | Qty: 90 | Fill #1

## 2016-01-27 MED FILL — FUROSEMIDE 80 MG TABLET: 80 | 30 days supply | Qty: 60 | Fill #6

## 2016-02-06 MED FILL — AMLODIPINE BESYLATE 10 MG T: 10 | 31 days supply | Qty: 31 | Fill #1

## 2016-02-10 ENCOUNTER — Other Ambulatory Visit (HOSPITAL_COMMUNITY): Payer: Self-pay | Admitting: *Deleted

## 2016-02-11 ENCOUNTER — Ambulatory Visit (HOSPITAL_COMMUNITY)
Admission: RE | Admit: 2016-02-11 | Discharge: 2016-02-11 | Disposition: A | Discharge status: Home / Self Care | Payer: Medicaid Other | Source: Ambulatory Visit | Attending: Nephrology | Admitting: Nephrology

## 2016-02-11 DIAGNOSIS — Z79899 Other long term (current) drug therapy: Secondary | ICD-10-CM | POA: Diagnosis not present

## 2016-02-11 DIAGNOSIS — Z5181 Encounter for therapeutic drug level monitoring: Secondary | ICD-10-CM | POA: Diagnosis not present

## 2016-02-11 DIAGNOSIS — N185 Chronic kidney disease, stage 5: Secondary | ICD-10-CM | POA: Diagnosis not present

## 2016-02-11 DIAGNOSIS — D631 Anemia in chronic kidney disease: Secondary | ICD-10-CM | POA: Diagnosis not present

## 2016-02-11 LAB — POCT HEMOGLOBIN-HEMACUE: Hemoglobin: 9 g/dL — ABNORMAL LOW (ref 13.0–17.0)

## 2016-02-11 MED ORDER — EPOETIN ALFA 20000 UNIT/ML IJ SOLN
20000.0000 [IU] | INTRAMUSCULAR | Status: DC
  Administered 2016-02-11: 20000 [IU] via SUBCUTANEOUS

## 2016-02-11 MED ORDER — CLONIDINE HCL 0.1 MG PO TABS: 0.1000 mg | ORAL_TABLET | Freq: Once | ORAL | Status: DC | PRN

## 2016-02-11 MED ORDER — EPOETIN ALFA 20000 UNIT/ML IJ SOLN
INTRAMUSCULAR | Status: AC
  Filled 2016-02-11: qty 1

## 2016-02-11 MED FILL — LABETALOL HCL 300 MG TABLET: 300 | 30 days supply | Qty: 90 | Fill #5

## 2016-02-11 NOTE — Discharge Instructions (Signed)
Epoetin Alfa injection What is this medicine? EPOETIN ALFA (e POE e tin AL fa) helps your body make more red blood cells. This medicine is used to treat anemia caused by chronic kidney failure, cancer chemotherapy, or HIV-therapy. It may also be used before surgery if you have anemia. This medicine may be used for other purposes; ask your health care provider or pharmacist if you have questions. What should I tell my health care provider before I take this medicine? They need to know if you have any of these conditions: -blood clotting disorders -cancer patient not on chemotherapy -cystic fibrosis -heart disease, such as angina or heart failure -hemoglobin level of 12 g/dL or greater -high blood pressure -low levels of folate, iron, or vitamin B12 -seizures -an unusual or allergic reaction to erythropoietin, albumin, benzyl alcohol, hamster proteins, other medicines, foods, dyes, or preservatives -pregnant or trying to get pregnant -breast-feeding How should I use this medicine? This medicine is for injection into a vein or under the skin. It is usually given by a health care professional in a hospital or clinic setting. If you get this medicine at home, you will be taught how to prepare and give this medicine. Use exactly as directed. Take your medicine at regular intervals. Do not take your medicine more often than directed. It is important that you put your used needles and syringes in a special sharps container. Do not put them in a trash can. If you do not have a sharps container, call your pharmacist or healthcare provider to get one. Talk to your pediatrician regarding the use of this medicine in children. While this drug may be prescribed for selected conditions, precautions do apply. Overdosage: If you think you have taken too much of this medicine contact a poison control center or emergency room at once. NOTE: This medicine is only for you. Do not share this medicine with  others. What if I miss a dose? If you miss a dose, take it as soon as you can. If it is almost time for your next dose, take only that dose. Do not take double or extra doses. What may interact with this medicine? Do not take this medicine with any of the following medications: -darbepoetin alfa This list may not describe all possible interactions. Give your health care provider a list of all the medicines, herbs, non-prescription drugs, or dietary supplements you use. Also tell them if you smoke, drink alcohol, or use illegal drugs. Some items may interact with your medicine. What should I watch for while using this medicine? Visit your prescriber or health care professional for regular checks on your progress and for the needed blood tests and blood pressure measurements. It is especially important for the doctor to make sure your hemoglobin level is in the desired range, to limit the risk of potential side effects and to give you the best benefit. Keep all appointments for any recommended tests. Check your blood pressure as directed. Ask your doctor what your blood pressure should be and when you should contact him or her. As your body makes more red blood cells, you may need to take iron, folic acid, or vitamin B supplements. Ask your doctor or health care provider which products are right for you. If you have kidney disease continue dietary restrictions, even though this medication can make you feel better. Talk with your doctor or health care professional about the foods you eat and the vitamins that you take. What side effects may I notice   from receiving this medicine? Side effects that you should report to your doctor or health care professional as soon as possible: -allergic reactions like skin rash, itching or hives, swelling of the face, lips, or tongue -breathing problems -changes in vision -chest pain -confusion, trouble speaking or understanding -feeling faint or lightheaded,  falls -high blood pressure -muscle aches or pains -pain, swelling, warmth in the leg -rapid weight gain -severe headaches -sudden numbness or weakness of the face, arm or leg -trouble walking, dizziness, loss of balance or coordination -seizures (convulsions) -swelling of the ankles, feet, hands -unusually weak or tired Side effects that usually do not require medical attention (report to your doctor or health care professional if they continue or are bothersome): -diarrhea -fever, chills (flu-like symptoms) -headaches -nausea, vomiting -redness, stinging, or swelling at site where injected This list may not describe all possible side effects. Call your doctor for medical advice about side effects. You may report side effects to FDA at 1-800-FDA-1088. Where should I keep my medicine? Keep out of the reach of children. Store in a refrigerator between 2 and 8 degrees C (36 and 46 degrees F). Do not freeze or shake. Throw away any unused portion if using a single-dose vial. Multi-dose vials can be kept in the refrigerator for up to 21 days after the initial dose. Throw away unused medicine. NOTE: This sheet is a summary. It may not cover all possible information. If you have questions about this medicine, talk to your doctor, pharmacist, or health care provider.    2016, Elsevier/Gold Standard. (2008-07-16 10:25:44)  

## 2016-02-25 ENCOUNTER — Encounter (HOSPITAL_COMMUNITY)
Admission: RE | Admit: 2016-02-25 | Discharge: 2016-02-25 | Disposition: A | Discharge status: Home / Self Care | Payer: Medicaid Other | Source: Ambulatory Visit | Attending: Nephrology | Admitting: Nephrology

## 2016-02-25 DIAGNOSIS — D638 Anemia in other chronic diseases classified elsewhere: Secondary | ICD-10-CM | POA: Diagnosis not present

## 2016-02-25 DIAGNOSIS — N184 Chronic kidney disease, stage 4 (severe): Secondary | ICD-10-CM | POA: Diagnosis present

## 2016-02-25 LAB — POCT HEMOGLOBIN-HEMACUE: Hemoglobin: 9 g/dL — ABNORMAL LOW (ref 13.0–17.0)

## 2016-02-25 MED ORDER — EPOETIN ALFA 20000 UNIT/ML IJ SOLN
20000.0000 [IU] | INTRAMUSCULAR | Status: DC
  Administered 2016-02-25: 20000 [IU] via SUBCUTANEOUS

## 2016-02-25 MED ORDER — EPOETIN ALFA 20000 UNIT/ML IJ SOLN
INTRAMUSCULAR | Status: AC
  Administered 2016-02-25: 20000 [IU] via SUBCUTANEOUS
  Filled 2016-02-25: qty 1

## 2016-02-25 MED FILL — RENVELA 2.4 GM PWDR PACKET: 2.4 | 30 days supply | Qty: 90 | Fill #0

## 2016-03-02 MED FILL — CALCITRIOL 0.25 MCG CAPSULE: 0.25 | 30 days supply | Qty: 90 | Fill #2

## 2016-03-02 MED FILL — CALCIUM ACETATE 667 MG CAP: 667 | 30 days supply | Qty: 90 | Fill #1

## 2016-03-02 MED FILL — AMLODIPINE BESYLATE 10 MG T: 10 | 31 days supply | Qty: 31 | Fill #2

## 2016-03-03 MED FILL — FUROSEMIDE 80 MG TABLET: 80 | 30 days supply | Qty: 60 | Fill #0

## 2016-03-10 ENCOUNTER — Encounter (HOSPITAL_COMMUNITY): Payer: PRIVATE HEALTH INSURANCE

## 2016-03-11 ENCOUNTER — Encounter (HOSPITAL_COMMUNITY)
Admission: RE | Admit: 2016-03-11 | Discharge: 2016-03-11 | Disposition: A | Discharge status: Home / Self Care | Payer: Medicaid Other | Source: Ambulatory Visit | Attending: Nephrology | Admitting: Nephrology

## 2016-03-11 DIAGNOSIS — N185 Chronic kidney disease, stage 5: Secondary | ICD-10-CM

## 2016-03-11 DIAGNOSIS — N184 Chronic kidney disease, stage 4 (severe): Secondary | ICD-10-CM | POA: Diagnosis not present

## 2016-03-11 LAB — IRON AND TIBC
Iron: 41 ug/dL — ABNORMAL LOW (ref 45–182)
SATURATION RATIOS: 14 % — AB (ref 17.9–39.5)
TIBC: 297 ug/dL (ref 250–450)
UIBC: 256 ug/dL

## 2016-03-11 LAB — FERRITIN: FERRITIN: 40 ng/mL (ref 24–336)

## 2016-03-11 LAB — POCT HEMOGLOBIN-HEMACUE: HEMOGLOBIN: 8.9 g/dL — AB (ref 13.0–17.0)

## 2016-03-11 MED ORDER — EPOETIN ALFA 40000 UNIT/ML IJ SOLN: 30000.0000 [IU] | INTRAMUSCULAR | Status: DC

## 2016-03-11 MED ORDER — EPOETIN ALFA 20000 UNIT/ML IJ SOLN
INTRAMUSCULAR | Status: AC
  Administered 2016-03-11: 20000 [IU] via SUBCUTANEOUS
  Filled 2016-03-11: qty 1

## 2016-03-11 MED ORDER — EPOETIN ALFA 10000 UNIT/ML IJ SOLN
INTRAMUSCULAR | Status: AC
  Administered 2016-03-11: 10000 [IU] via SUBCUTANEOUS
  Filled 2016-03-11: qty 1

## 2016-03-23 ENCOUNTER — Other Ambulatory Visit (HOSPITAL_COMMUNITY): Payer: Self-pay | Admitting: *Deleted

## 2016-03-24 ENCOUNTER — Ambulatory Visit (HOSPITAL_COMMUNITY)
Admission: RE | Admit: 2016-03-24 | Discharge: 2016-03-24 | Disposition: A | Discharge status: Home / Self Care | Payer: Medicaid Other | Source: Ambulatory Visit | Attending: Nephrology | Admitting: Nephrology

## 2016-03-24 DIAGNOSIS — D509 Iron deficiency anemia, unspecified: Secondary | ICD-10-CM | POA: Diagnosis not present

## 2016-03-24 DIAGNOSIS — N185 Chronic kidney disease, stage 5: Secondary | ICD-10-CM

## 2016-03-24 LAB — POCT HEMOGLOBIN-HEMACUE: HEMOGLOBIN: 9.5 g/dL — AB (ref 13.0–17.0)

## 2016-03-24 MED ORDER — SODIUM CHLORIDE 0.9 % IV SOLN
510.0000 mg | INTRAVENOUS | Status: DC
  Administered 2016-03-24: 510 mg via INTRAVENOUS
  Filled 2016-03-24: qty 17

## 2016-03-24 MED ORDER — EPOETIN ALFA 10000 UNIT/ML IJ SOLN
INTRAMUSCULAR | Status: AC
  Administered 2016-03-24: 10000 [IU]
  Filled 2016-03-24: qty 1

## 2016-03-24 MED ORDER — EPOETIN ALFA 20000 UNIT/ML IJ SOLN
INTRAMUSCULAR | Status: AC
  Administered 2016-03-24: 20000 [IU]
  Filled 2016-03-24: qty 1

## 2016-03-24 MED ORDER — EPOETIN ALFA 40000 UNIT/ML IJ SOLN: 30000.0000 [IU] | INTRAMUSCULAR | Status: DC

## 2016-03-30 ENCOUNTER — Other Ambulatory Visit (HOSPITAL_COMMUNITY): Payer: Self-pay | Admitting: *Deleted

## 2016-03-31 ENCOUNTER — Ambulatory Visit (HOSPITAL_COMMUNITY)
Admission: RE | Admit: 2016-03-31 | Discharge: 2016-03-31 | Disposition: A | Discharge status: Home / Self Care | Payer: Medicaid Other | Source: Ambulatory Visit | Attending: Nephrology | Admitting: Nephrology

## 2016-03-31 DIAGNOSIS — D509 Iron deficiency anemia, unspecified: Secondary | ICD-10-CM | POA: Diagnosis present

## 2016-03-31 MED ORDER — SODIUM CHLORIDE 0.9 % IV SOLN
510.0000 mg | INTRAVENOUS | Status: AC
  Administered 2016-03-31: 510 mg via INTRAVENOUS
  Filled 2016-03-31: qty 17

## 2016-03-31 MED FILL — LABETALOL HCL 300 MG TABLET: 300 | 30 days supply | Qty: 90 | Fill #6

## 2016-03-31 MED FILL — AMLODIPINE BESYLATE 10 MG T: 10 | 31 days supply | Qty: 31 | Fill #3

## 2016-04-07 ENCOUNTER — Encounter (HOSPITAL_COMMUNITY): Payer: PRIVATE HEALTH INSURANCE

## 2016-04-21 ENCOUNTER — Encounter (HOSPITAL_COMMUNITY)
Admission: RE | Admit: 2016-04-21 | Discharge: 2016-04-21 | Disposition: A | Discharge status: Home / Self Care | Payer: Medicaid Other | Source: Ambulatory Visit | Attending: Nephrology | Admitting: Nephrology

## 2016-04-21 DIAGNOSIS — D638 Anemia in other chronic diseases classified elsewhere: Secondary | ICD-10-CM | POA: Insufficient documentation

## 2016-04-21 DIAGNOSIS — N184 Chronic kidney disease, stage 4 (severe): Secondary | ICD-10-CM | POA: Diagnosis present

## 2016-04-21 DIAGNOSIS — N185 Chronic kidney disease, stage 5: Secondary | ICD-10-CM

## 2016-04-21 LAB — IRON AND TIBC
Iron: 80 ug/dL (ref 45–182)
Saturation Ratios: 31 % (ref 17.9–39.5)
TIBC: 259 ug/dL (ref 250–450)
UIBC: 179 ug/dL

## 2016-04-21 LAB — POCT HEMOGLOBIN-HEMACUE: HEMOGLOBIN: 11.5 g/dL — AB (ref 13.0–17.0)

## 2016-04-21 LAB — FERRITIN: FERRITIN: 261 ng/mL (ref 24–336)

## 2016-04-21 MED ORDER — EPOETIN ALFA 40000 UNIT/ML IJ SOLN: 30000.0000 [IU] | INTRAMUSCULAR | Status: DC

## 2016-04-21 MED ORDER — EPOETIN ALFA 20000 UNIT/ML IJ SOLN
INTRAMUSCULAR | Status: AC
  Administered 2016-04-21: 20000 [IU]
  Filled 2016-04-21: qty 1

## 2016-04-21 MED ORDER — EPOETIN ALFA 10000 UNIT/ML IJ SOLN
INTRAMUSCULAR | Status: AC
  Administered 2016-04-21: 10000 [IU]
  Filled 2016-04-21: qty 1

## 2016-05-05 ENCOUNTER — Encounter (HOSPITAL_COMMUNITY): Payer: PRIVATE HEALTH INSURANCE

## 2016-05-05 ENCOUNTER — Encounter (HOSPITAL_COMMUNITY)
Admission: RE | Admit: 2016-05-05 | Discharge: 2016-05-05 | Disposition: A | Discharge status: Home / Self Care | Payer: Medicaid Other | Source: Ambulatory Visit | Attending: Nephrology | Admitting: Nephrology

## 2016-05-05 DIAGNOSIS — N185 Chronic kidney disease, stage 5: Secondary | ICD-10-CM

## 2016-05-05 DIAGNOSIS — N184 Chronic kidney disease, stage 4 (severe): Secondary | ICD-10-CM | POA: Diagnosis not present

## 2016-05-05 LAB — POCT HEMOGLOBIN-HEMACUE: Hemoglobin: 11.9 g/dL — ABNORMAL LOW (ref 13.0–17.0)

## 2016-05-05 MED ORDER — EPOETIN ALFA 40000 UNIT/ML IJ SOLN: 30000.0000 [IU] | INTRAMUSCULAR | Status: DC

## 2016-05-05 MED ORDER — EPOETIN ALFA 10000 UNIT/ML IJ SOLN
INTRAMUSCULAR | Status: AC
  Administered 2016-05-05: 10000 [IU]
  Filled 2016-05-05: qty 1

## 2016-05-05 MED ORDER — EPOETIN ALFA 20000 UNIT/ML IJ SOLN
INTRAMUSCULAR | Status: AC
  Administered 2016-05-05: 20000 [IU]
  Filled 2016-05-05: qty 1

## 2016-05-14 NOTE — Addendum Note (Signed)
Encounter addended by: Lindajo Royal, RN on: 05/14/2016  8:55 AM<BR>    Actions taken: MAR administration edited, MAR administration accepted

## 2016-05-19 ENCOUNTER — Encounter (HOSPITAL_COMMUNITY)
Admission: RE | Admit: 2016-05-19 | Discharge: 2016-05-19 | Disposition: A | Discharge status: Home / Self Care | Payer: Medicaid Other | Source: Ambulatory Visit | Attending: Nephrology | Admitting: Nephrology

## 2016-05-19 DIAGNOSIS — N184 Chronic kidney disease, stage 4 (severe): Secondary | ICD-10-CM | POA: Insufficient documentation

## 2016-05-19 DIAGNOSIS — N189 Chronic kidney disease, unspecified: Secondary | ICD-10-CM

## 2016-05-19 DIAGNOSIS — D638 Anemia in other chronic diseases classified elsewhere: Secondary | ICD-10-CM | POA: Insufficient documentation

## 2016-05-19 LAB — IRON AND TIBC
Iron: 62 ug/dL (ref 45–182)
SATURATION RATIOS: 24 % (ref 17.9–39.5)
TIBC: 263 ug/dL (ref 250–450)
UIBC: 201 ug/dL

## 2016-05-19 LAB — POCT HEMOGLOBIN-HEMACUE: HEMOGLOBIN: 12 g/dL — AB (ref 13.0–17.0)

## 2016-05-19 LAB — FERRITIN: Ferritin: 147 ng/mL (ref 24–336)

## 2016-05-19 MED ORDER — EPOETIN ALFA 40000 UNIT/ML IJ SOLN: 30000.0000 [IU] | INTRAMUSCULAR | Status: DC

## 2016-06-01 ENCOUNTER — Other Ambulatory Visit (HOSPITAL_COMMUNITY): Payer: Self-pay | Admitting: *Deleted

## 2016-06-02 ENCOUNTER — Encounter (HOSPITAL_COMMUNITY)
Admission: RE | Admit: 2016-06-02 | Discharge: 2016-06-02 | Disposition: A | Discharge status: Home / Self Care | Payer: Medicaid Other | Source: Ambulatory Visit | Attending: Nephrology | Admitting: Nephrology

## 2016-06-02 ENCOUNTER — Encounter (HOSPITAL_COMMUNITY): Payer: Self-pay

## 2016-06-02 DIAGNOSIS — N184 Chronic kidney disease, stage 4 (severe): Secondary | ICD-10-CM | POA: Diagnosis not present

## 2016-06-02 DIAGNOSIS — N189 Chronic kidney disease, unspecified: Secondary | ICD-10-CM

## 2016-06-02 LAB — POCT HEMOGLOBIN-HEMACUE: Hemoglobin: 11.2 g/dL — ABNORMAL LOW (ref 13.0–17.0)

## 2016-06-02 MED ORDER — SODIUM CHLORIDE 0.9 % IV SOLN
510.0000 mg | Freq: Once | INTRAVENOUS | Status: AC
  Administered 2016-06-02: 510 mg via INTRAVENOUS
  Filled 2016-06-02: qty 17

## 2016-06-02 MED ORDER — EPOETIN ALFA 10000 UNIT/ML IJ SOLN
INTRAMUSCULAR | Status: AC
  Administered 2016-06-02: 10000 [IU]
  Filled 2016-06-02: qty 1

## 2016-06-02 MED ORDER — EPOETIN ALFA 40000 UNIT/ML IJ SOLN: 30000.0000 [IU] | INTRAMUSCULAR | Status: DC

## 2016-06-02 MED ORDER — EPOETIN ALFA 20000 UNIT/ML IJ SOLN
INTRAMUSCULAR | Status: AC
  Administered 2016-06-02: 20000 [IU]
  Filled 2016-06-02: qty 1

## 2016-06-16 ENCOUNTER — Encounter (HOSPITAL_COMMUNITY)
Admission: RE | Admit: 2016-06-16 | Discharge: 2016-06-16 | Disposition: A | Discharge status: Home / Self Care | Payer: Medicaid Other | Source: Ambulatory Visit | Attending: Nephrology | Admitting: Nephrology

## 2016-06-16 DIAGNOSIS — D638 Anemia in other chronic diseases classified elsewhere: Secondary | ICD-10-CM | POA: Diagnosis not present

## 2016-06-16 DIAGNOSIS — N184 Chronic kidney disease, stage 4 (severe): Secondary | ICD-10-CM | POA: Diagnosis not present

## 2016-06-16 DIAGNOSIS — N189 Chronic kidney disease, unspecified: Secondary | ICD-10-CM

## 2016-06-16 LAB — IRON AND TIBC
IRON: 63 ug/dL (ref 45–182)
Saturation Ratios: 26 % (ref 17.9–39.5)
TIBC: 246 ug/dL — AB (ref 250–450)
UIBC: 183 ug/dL

## 2016-06-16 LAB — FERRITIN: Ferritin: 241 ng/mL (ref 24–336)

## 2016-06-16 LAB — POCT HEMOGLOBIN-HEMACUE: Hemoglobin: 10.7 g/dL — ABNORMAL LOW (ref 13.0–17.0)

## 2016-06-16 MED ORDER — EPOETIN ALFA 10000 UNIT/ML IJ SOLN
INTRAMUSCULAR | Status: AC
  Administered 2016-06-16: 10000 [IU] via SUBCUTANEOUS
  Filled 2016-06-16: qty 1

## 2016-06-16 MED ORDER — EPOETIN ALFA 20000 UNIT/ML IJ SOLN
INTRAMUSCULAR | Status: AC
  Administered 2016-06-16: 20000 [IU] via SUBCUTANEOUS
  Filled 2016-06-16: qty 1

## 2016-06-16 MED ORDER — EPOETIN ALFA 40000 UNIT/ML IJ SOLN: 30000.0000 [IU] | INTRAMUSCULAR | Status: DC

## 2016-06-29 ENCOUNTER — Other Ambulatory Visit (HOSPITAL_COMMUNITY): Payer: Self-pay | Admitting: *Deleted

## 2016-06-30 ENCOUNTER — Inpatient Hospital Stay (HOSPITAL_COMMUNITY): Admission: RE | Admit: 2016-06-30 | Payer: PRIVATE HEALTH INSURANCE | Source: Ambulatory Visit

## 2016-07-02 ENCOUNTER — Encounter (HOSPITAL_COMMUNITY)
Admission: RE | Admit: 2016-07-02 | Discharge: 2016-07-02 | Disposition: A | Discharge status: Home / Self Care | Payer: Medicaid Other | Source: Ambulatory Visit | Attending: Nephrology | Admitting: Nephrology

## 2016-07-02 DIAGNOSIS — N184 Chronic kidney disease, stage 4 (severe): Secondary | ICD-10-CM | POA: Diagnosis not present

## 2016-07-02 DIAGNOSIS — N189 Chronic kidney disease, unspecified: Secondary | ICD-10-CM

## 2016-07-02 LAB — POCT HEMOGLOBIN-HEMACUE: Hemoglobin: 11.8 g/dL — ABNORMAL LOW (ref 13.0–17.0)

## 2016-07-02 MED ORDER — EPOETIN ALFA 10000 UNIT/ML IJ SOLN
INTRAMUSCULAR | Status: AC
  Administered 2016-07-02: 10000 [IU] via SUBCUTANEOUS
  Filled 2016-07-02: qty 1

## 2016-07-02 MED ORDER — EPOETIN ALFA 20000 UNIT/ML IJ SOLN
INTRAMUSCULAR | Status: AC
  Administered 2016-07-02: 20000 [IU] via SUBCUTANEOUS
  Filled 2016-07-02: qty 1

## 2016-07-02 MED ORDER — EPOETIN ALFA 40000 UNIT/ML IJ SOLN: 30000.0000 [IU] | INTRAMUSCULAR | Status: DC

## 2016-07-12 ENCOUNTER — Ambulatory Visit: Payer: Medicaid Other | Admitting: Podiatry

## 2016-07-15 ENCOUNTER — Ambulatory Visit: Payer: Medicaid Other | Admitting: Podiatry

## 2016-07-15 ENCOUNTER — Other Ambulatory Visit (HOSPITAL_COMMUNITY): Payer: Self-pay | Admitting: *Deleted

## 2016-07-16 ENCOUNTER — Ambulatory Visit (HOSPITAL_COMMUNITY)
Admission: RE | Admit: 2016-07-16 | Discharge: 2016-07-16 | Disposition: A | Discharge status: Home / Self Care | Payer: Medicaid Other | Source: Ambulatory Visit | Attending: Nephrology | Admitting: Nephrology

## 2016-07-16 DIAGNOSIS — N189 Chronic kidney disease, unspecified: Secondary | ICD-10-CM

## 2016-07-16 DIAGNOSIS — D509 Iron deficiency anemia, unspecified: Secondary | ICD-10-CM | POA: Insufficient documentation

## 2016-07-16 LAB — IRON AND TIBC
Iron: 66 ug/dL (ref 45–182)
Saturation Ratios: 24 % (ref 17.9–39.5)
TIBC: 279 ug/dL (ref 250–450)
UIBC: 213 ug/dL

## 2016-07-16 LAB — POCT HEMOGLOBIN-HEMACUE: HEMOGLOBIN: 12 g/dL — AB (ref 13.0–17.0)

## 2016-07-16 LAB — FERRITIN: FERRITIN: 164 ng/mL (ref 24–336)

## 2016-07-16 MED ORDER — EPOETIN ALFA 40000 UNIT/ML IJ SOLN: 30000.0000 [IU] | INTRAMUSCULAR | Status: DC

## 2016-07-16 MED ORDER — SODIUM CHLORIDE 0.9 % IV SOLN
510.0000 mg | Freq: Once | INTRAVENOUS | Status: AC
  Administered 2016-07-16: 510 mg via INTRAVENOUS
  Filled 2016-07-16: qty 17

## 2016-07-27 ENCOUNTER — Other Ambulatory Visit (HOSPITAL_COMMUNITY): Payer: Self-pay | Admitting: *Deleted

## 2016-07-28 ENCOUNTER — Encounter (HOSPITAL_COMMUNITY)
Admission: RE | Admit: 2016-07-28 | Discharge: 2016-07-28 | Disposition: A | Discharge status: Home / Self Care | Payer: Medicaid Other | Source: Ambulatory Visit | Attending: Nephrology | Admitting: Nephrology

## 2016-07-28 DIAGNOSIS — N189 Chronic kidney disease, unspecified: Secondary | ICD-10-CM

## 2016-07-28 DIAGNOSIS — N184 Chronic kidney disease, stage 4 (severe): Secondary | ICD-10-CM | POA: Diagnosis not present

## 2016-07-28 DIAGNOSIS — D638 Anemia in other chronic diseases classified elsewhere: Secondary | ICD-10-CM | POA: Diagnosis not present

## 2016-07-28 LAB — POCT HEMOGLOBIN-HEMACUE: Hemoglobin: 12 g/dL — ABNORMAL LOW (ref 13.0–17.0)

## 2016-07-28 MED ORDER — EPOETIN ALFA 40000 UNIT/ML IJ SOLN: 30000.0000 [IU] | INTRAMUSCULAR | Status: DC

## 2016-08-11 ENCOUNTER — Ambulatory Visit (HOSPITAL_COMMUNITY)
Admission: RE | Admit: 2016-08-11 | Discharge: 2016-08-11 | Disposition: A | Discharge status: Home / Self Care | Payer: Medicaid Other | Source: Ambulatory Visit | Attending: Nephrology | Admitting: Nephrology

## 2016-08-11 DIAGNOSIS — D509 Iron deficiency anemia, unspecified: Secondary | ICD-10-CM | POA: Insufficient documentation

## 2016-08-11 DIAGNOSIS — N189 Chronic kidney disease, unspecified: Secondary | ICD-10-CM

## 2016-08-11 LAB — POCT HEMOGLOBIN-HEMACUE: HEMOGLOBIN: 11.1 g/dL — AB (ref 13.0–17.0)

## 2016-08-11 MED ORDER — SODIUM CHLORIDE 0.9 % IV SOLN
510.0000 mg | Freq: Once | INTRAVENOUS | Status: AC
  Administered 2016-08-11: 510 mg via INTRAVENOUS
  Filled 2016-08-11: qty 17

## 2016-08-11 MED ORDER — EPOETIN ALFA 20000 UNIT/ML IJ SOLN
INTRAMUSCULAR | Status: AC
  Administered 2016-08-11: 20000 [IU]
  Filled 2016-08-11: qty 1

## 2016-08-11 MED ORDER — EPOETIN ALFA 40000 UNIT/ML IJ SOLN: 30000.0000 [IU] | INTRAMUSCULAR | Status: DC

## 2016-08-11 MED ORDER — EPOETIN ALFA 10000 UNIT/ML IJ SOLN
INTRAMUSCULAR | Status: AC
  Administered 2016-08-11: 10000 [IU]
  Filled 2016-08-11: qty 1

## 2016-08-25 ENCOUNTER — Encounter (HOSPITAL_COMMUNITY)
Admission: RE | Admit: 2016-08-25 | Discharge: 2016-08-25 | Disposition: A | Discharge status: Home / Self Care | Payer: Medicaid Other | Source: Ambulatory Visit | Attending: Nephrology | Admitting: Nephrology

## 2016-08-25 DIAGNOSIS — D638 Anemia in other chronic diseases classified elsewhere: Secondary | ICD-10-CM | POA: Insufficient documentation

## 2016-08-25 DIAGNOSIS — N184 Chronic kidney disease, stage 4 (severe): Secondary | ICD-10-CM | POA: Diagnosis present

## 2016-08-25 DIAGNOSIS — N189 Chronic kidney disease, unspecified: Secondary | ICD-10-CM

## 2016-08-25 LAB — POCT HEMOGLOBIN-HEMACUE: Hemoglobin: 10.9 g/dL — ABNORMAL LOW (ref 13.0–17.0)

## 2016-08-25 LAB — IRON AND TIBC
IRON: 73 ug/dL (ref 45–182)
Saturation Ratios: 32 % (ref 17.9–39.5)
TIBC: 230 ug/dL — ABNORMAL LOW (ref 250–450)
UIBC: 157 ug/dL

## 2016-08-25 LAB — FERRITIN: FERRITIN: 673 ng/mL — AB (ref 24–336)

## 2016-08-25 MED ORDER — EPOETIN ALFA 20000 UNIT/ML IJ SOLN
INTRAMUSCULAR | Status: AC
  Administered 2016-08-25: 20000 [IU]
  Filled 2016-08-25: qty 1

## 2016-08-25 MED ORDER — EPOETIN ALFA 10000 UNIT/ML IJ SOLN
INTRAMUSCULAR | Status: AC
  Administered 2016-08-25: 10000 [IU]
  Filled 2016-08-25: qty 1

## 2016-08-25 MED ORDER — EPOETIN ALFA 40000 UNIT/ML IJ SOLN: 30000.0000 [IU] | INTRAMUSCULAR | Status: DC

## 2016-09-07 ENCOUNTER — Other Ambulatory Visit (HOSPITAL_COMMUNITY): Payer: Self-pay | Admitting: *Deleted

## 2016-09-08 ENCOUNTER — Encounter (HOSPITAL_COMMUNITY)
Admission: RE | Admit: 2016-09-08 | Discharge: 2016-09-08 | Disposition: A | Discharge status: Home / Self Care | Payer: Medicaid Other | Source: Ambulatory Visit | Attending: Nephrology | Admitting: Nephrology

## 2016-09-08 DIAGNOSIS — N184 Chronic kidney disease, stage 4 (severe): Secondary | ICD-10-CM | POA: Diagnosis not present

## 2016-09-08 DIAGNOSIS — N189 Chronic kidney disease, unspecified: Secondary | ICD-10-CM

## 2016-09-08 LAB — POCT HEMOGLOBIN-HEMACUE: Hemoglobin: 10.6 g/dL — ABNORMAL LOW (ref 13.0–17.0)

## 2016-09-08 MED ORDER — EPOETIN ALFA 40000 UNIT/ML IJ SOLN: 30000.0000 [IU] | INTRAMUSCULAR | Status: DC

## 2016-09-08 MED ORDER — EPOETIN ALFA 10000 UNIT/ML IJ SOLN
INTRAMUSCULAR | Status: AC
  Administered 2016-09-08: 10000 [IU]
  Filled 2016-09-08: qty 1

## 2016-09-08 MED ORDER — EPOETIN ALFA 20000 UNIT/ML IJ SOLN
INTRAMUSCULAR | Status: AC
  Administered 2016-09-08: 20000 [IU]
  Filled 2016-09-08: qty 1

## 2016-09-22 ENCOUNTER — Encounter (HOSPITAL_COMMUNITY): Payer: PRIVATE HEALTH INSURANCE

## 2016-09-22 DIAGNOSIS — N529 Male erectile dysfunction, unspecified: Secondary | ICD-10-CM | POA: Insufficient documentation

## 2016-09-22 DIAGNOSIS — I429 Cardiomyopathy, unspecified: Secondary | ICD-10-CM | POA: Insufficient documentation

## 2016-09-22 DIAGNOSIS — D689 Coagulation defect, unspecified: Secondary | ICD-10-CM | POA: Insufficient documentation

## 2016-09-22 DIAGNOSIS — D509 Iron deficiency anemia, unspecified: Secondary | ICD-10-CM | POA: Insufficient documentation

## 2016-10-15 DIAGNOSIS — Z23 Encounter for immunization: Secondary | ICD-10-CM | POA: Insufficient documentation

## 2016-10-19 DIAGNOSIS — Z992 Dependence on renal dialysis: Secondary | ICD-10-CM | POA: Insufficient documentation

## 2017-01-10 ENCOUNTER — Emergency Department (HOSPITAL_COMMUNITY)
Admission: EM | Admit: 2017-01-10 | Discharge: 2017-01-10 | Disposition: A | Discharge status: Home / Self Care | Payer: Medicaid Other | Attending: Emergency Medicine | Admitting: Emergency Medicine

## 2017-01-10 ENCOUNTER — Encounter (HOSPITAL_COMMUNITY): Payer: Self-pay | Admitting: Emergency Medicine

## 2017-01-10 DIAGNOSIS — N186 End stage renal disease: Secondary | ICD-10-CM | POA: Diagnosis not present

## 2017-01-10 DIAGNOSIS — Z79899 Other long term (current) drug therapy: Secondary | ICD-10-CM | POA: Diagnosis not present

## 2017-01-10 DIAGNOSIS — Z992 Dependence on renal dialysis: Secondary | ICD-10-CM | POA: Diagnosis not present

## 2017-01-10 DIAGNOSIS — I5043 Acute on chronic combined systolic (congestive) and diastolic (congestive) heart failure: Secondary | ICD-10-CM | POA: Diagnosis not present

## 2017-01-10 DIAGNOSIS — I1311 Hypertensive heart and chronic kidney disease without heart failure, with stage 5 chronic kidney disease, or end stage renal disease: Secondary | ICD-10-CM | POA: Insufficient documentation

## 2017-01-10 DIAGNOSIS — I160 Hypertensive urgency: Secondary | ICD-10-CM

## 2017-01-10 LAB — BASIC METABOLIC PANEL
Anion gap: 13 (ref 5–15)
BUN: 39 mg/dL — AB (ref 6–20)
CO2: 26 mmol/L (ref 22–32)
Calcium: 9.3 mg/dL (ref 8.9–10.3)
Chloride: 96 mmol/L — ABNORMAL LOW (ref 101–111)
Creatinine, Ser: 7.47 mg/dL — ABNORMAL HIGH (ref 0.61–1.24)
GFR, EST AFRICAN AMERICAN: 9 mL/min — AB (ref >60)
GFR, EST NON AFRICAN AMERICAN: 7 mL/min — AB (ref >60)
Glucose, Bld: 117 mg/dL — ABNORMAL HIGH (ref 65–99)
Potassium: 3.5 mmol/L (ref 3.5–5.1)
SODIUM: 135 mmol/L (ref 135–145)

## 2017-01-10 MED ORDER — HYDRALAZINE HCL 10 MG PO TABS
10.0000 mg | ORAL_TABLET | Freq: Once | ORAL | Status: AC
  Administered 2017-01-10: 10 mg via ORAL
  Filled 2017-01-10: qty 1

## 2017-01-10 MED ORDER — HYDRALAZINE HCL 10 MG PO TABS: 10.0000 mg | ORAL_TABLET | Freq: Three times a day (TID) | ORAL | 0 refills | Status: DC

## 2017-01-10 NOTE — ED Provider Notes (Signed)
[facility] DEPT Provider Note   CSN: [id number] Arrival date & time: 01/10/17  1743     History   Chief Complaint Chief Complaint  Patient presents with  . Hypertension    HPI Devin Bullock is a 51 y.o. male.  HPI Patient underwent dialysis today and reports that his blood pressure was reading 184/120. He denies any symptoms with this. He is not having headache, blurred vision, chest pain, shortness of breath or peripheral edema. He is concerned because he thinks that at his dialysis session previous to today, they did not take any volume off (despite the fact that he had missed the session previous to that). He reports he watched the machine and and described to me the pattern of the lights (I advised him I was not familiar with the machines themselves and could not comment on what had occurred during his sessions based on this description). He reports that they only took off a couple of kilos in today's session and by his estimation he is still way over on fluid and believes that's why his blood pressure is elevated. He reports he still makes a lot of urine despite being on dialysis. Patient reports that he took his amlodipine when he finished dialysis. Past Medical History:  Diagnosis Date  . Chronic kidney disease   . Enlarged heart   . Hypertension   . Morbid obesity [facility])     Patient Active Problem List   Diagnosis Date Noted  . CKD (chronic kidney disease) 07/03/2014  . End stage renal disease ([place]) 08/08/2013  . Chronic kidney disease (CKD), stage IV (severe) ([place]) 04/17/2013  . Patient nonadherence 04/17/2013  . Morbid obesity ([place]) 04/15/2013  . Anemia 04/15/2013  . Cardiomegaly 04/13/2013  . Hypertensive emergency 04/13/2013  . Acute on chronic combined systolic and diastolic CHF (congestive heart failure) ([place]) 04/13/2013    Past Surgical History:  Procedure Laterality Date  . AV FISTULA PLACEMENT Left 05/24/2014   Procedure: BRACHIOCEPHALIC ARTERIOVENOUS (AV)  FISTULA CREATION;  Surgeon: Angelia Mould, MD;  Location: [place];  Service: Vascular;  Laterality: Left;  . None         Home Medications    Prior to Admission medications   Medication Sig Start Date End Date Taking? Authorizing Provider  amLODipine (NORVASC) 10 MG tablet TAKE 1 TABLET BY MOUTH DAILY. 03/31/15  Yes Lance Bosch, NP  calcium acetate (PHOSLO) 667 MG capsule Take 1,334 mg by mouth 3 (three) times daily with meals.   Yes [provider]  Multiple Vitamin (MULTIVITAMIN WITH MINERALS) TABS tablet Take 1 tablet by mouth daily.   Yes [provider]  calcitRIOL (ROCALTROL) 0.25 MCG capsule Take 1 capsule (0.25 mcg total) by mouth daily. Patient not taking: Reported on 01/10/2017 06/13/14   Lance Bosch, NP  furosemide (LASIX) 80 MG tablet Take 1 tablet (80 mg total) by mouth 2 (two) times daily. Take 160 mg in the am & 80 mg in the pm Patient not taking: Reported on 01/10/2017 06/13/14   Lance Bosch, NP  hydrALAZINE (APRESOLINE) 10 MG tablet Take 1 tablet (10 mg total) by mouth 3 (three) times daily. 01/10/17   Charlesetta Shanks, MD  isosorbide mononitrate (IMDUR) 30 MG 24 hr tablet TAKE 1 TABLET BY MOUTH DAILY. Patient not taking: Reported on 01/10/2017 01/03/15   Lance Bosch, NP  labetalol (NORMODYNE) 300 MG tablet Take 1 tablet (300 mg total) by mouth 3 (three) times daily. Patient not taking: Reported on  01/10/2017 06/13/14   Lance Bosch, NP  oxyCODONE (ROXICODONE) 5 MG immediate release tablet Take 1 tablet (5 mg total) by mouth every 6 (six) hours as needed for severe pain. Patient not taking: Reported on 08/06/2014 05/24/14   Gabriel Earing, PA-C  sildenafil (VIAGRA) 25 MG tablet Take 1 tablet (25 mg total) by mouth daily as needed for erectile dysfunction. 08/06/14   Lance Bosch, NP    Family History Family History  Problem Relation Age of Onset  . Diabetes Mother   . Hypertension Mother   . Heart attack Mother 43  . Stroke  Father   . Hypertension Father   . Depression Father   . Diabetes Brother   . Hypertension Brother   . Stroke Brother 48  . Alcohol abuse Brother   . Asthma Brother     Social History Social History  Substance Use Topics  . Smoking status: Never Smoker  . Smokeless tobacco: Never Used  . Alcohol use No     Allergies   Patient has no known allergies.   Review of Systems Review of Systems 10 Systems reviewed and are negative for acute change except as noted in the HPI.  Physical Exam Updated Vital Signs BP (!) 164/96 (BP Location: Right Arm)   Pulse 88   Temp 98.3 F (36.8 C)   Resp 16   Ht 6\' 4"  (1.93 m)   Wt 136.1 kg (300 lb)   SpO2 100%   BMI 36.52 kg/m   Physical Exam  Constitutional: He is oriented to person, place, and time.  Patient is alert and nontoxic in appearance. He is sitting at the edges stretcher. He is animated and interactive. No respiratory distress. Morbid obesity.  HENT:  Head: Normocephalic and atraumatic.  Mouth/Throat: Oropharynx is clear and moist.  Eyes: EOM are normal.  Cardiovascular: Normal rate, regular rhythm, normal heart sounds and intact distal pulses.   Pulmonary/Chest: Effort normal and breath sounds normal.  Abdominal: Soft. He exhibits no distension. There is no tenderness. There is no guarding.  Abdomen is morbidly obese.  Musculoskeletal: Normal range of motion. He exhibits no edema or tenderness.  Although the patient does have large legs, he does not have significant peripheral edema or pitting.  Neurological: He is alert and oriented to person, place, and time. No cranial nerve deficit. He exhibits normal muscle tone. Coordination normal.  Skin: Skin is warm and dry.  Psychiatric: He has a normal mood and affect.     ED Treatments / Results  Labs (all labs ordered are listed, but only abnormal results are displayed) Labs Reviewed  BASIC METABOLIC PANEL - Abnormal; Notable for the following:       Result Value    Chloride 96 (*)    Glucose, Bld 117 (*)    BUN 39 (*)    Creatinine, Ser 7.47 (*)    GFR calc non Af Amer 7 (*)    GFR calc Af Amer 9 (*)    All other components within normal limits    EKG  EKG Interpretation None       Radiology No results found.  Procedures Procedures (including critical care time)  Medications Ordered in ED Medications  hydrALAZINE (APRESOLINE) tablet 10 mg (10 mg Oral Given 01/10/17 2208)     Initial Impression / Assessment and Plan / ED Course  I have reviewed the triage vital signs and the nursing notes.  Pertinent labs & imaging results that were available during my  care of the patient were reviewed by me and considered in my medical decision making (see chart for details).      Final Clinical Impressions(s) / ED Diagnoses   Final diagnoses:  Hypertensive urgency  ESRD (end stage renal disease) ([place])  Patient presents asymptomatic with elevated blood pressure. Patient's pressure was improving as he was in the emergency department. He had taken his amlodipine later in the day. Pressure spontaneously had decreased from a diastolic of [id number] to 78LFYB. One a patient's primary concerns was that they had not removed enough volume during his dialysis sessions. Clinically however the patient does not show signs of volume overload. His lungs are clear with no respiratory distress and he does not have peripheral edema. For increased blood pressure management and patient is given a prescription for hydralazine to use on an as needed basis. He is to have close follow-up with his PCP to determine if hydralazine should be added as a routine medication. At this time there is counseling given on compliance with current medications and dietary instructions.  New Prescriptions Discharge Medication List as of 01/10/2017  9:59 PM    START taking these medications   Details  hydrALAZINE (APRESOLINE) 10 MG tablet Take 1 tablet (10 mg total) by mouth 3 (three) times  daily., Starting Mon 01/10/2017, Print         Charlesetta Shanks, MD 01/15/17 [phone]

## 2017-01-10 NOTE — ED Triage Notes (Signed)
Pt verbalizes left post dialysis at 1500; BP at discharge 184/120; pt concerned so requesting evaluation here. Pt denies numbness, tingling, weakness, or dizziness. Pt appears anxious verbalizing "I am just worried."

## 2017-01-10 NOTE — Discharge Instructions (Signed)
Continue your amlodipine as prescribed. Start hydralazine 10 mg 3 times a day. Monitor your blood pressures and keep a log. If your blood pressure is below 120/70, do not take a scheduled dose of hydralazine. See your doctor for recheck within 2-3 days.

## 2017-03-26 DIAGNOSIS — E8770 Fluid overload, unspecified: Secondary | ICD-10-CM | POA: Insufficient documentation

## 2017-05-08 ENCOUNTER — Encounter (HOSPITAL_COMMUNITY): Payer: Self-pay

## 2017-05-08 DIAGNOSIS — Z79899 Other long term (current) drug therapy: Secondary | ICD-10-CM | POA: Diagnosis not present

## 2017-05-08 DIAGNOSIS — N186 End stage renal disease: Secondary | ICD-10-CM | POA: Insufficient documentation

## 2017-05-08 DIAGNOSIS — Z992 Dependence on renal dialysis: Secondary | ICD-10-CM | POA: Diagnosis not present

## 2017-05-08 DIAGNOSIS — R748 Abnormal levels of other serum enzymes: Secondary | ICD-10-CM | POA: Diagnosis not present

## 2017-05-08 DIAGNOSIS — I120 Hypertensive chronic kidney disease with stage 5 chronic kidney disease or end stage renal disease: Secondary | ICD-10-CM | POA: Insufficient documentation

## 2017-05-08 DIAGNOSIS — R0602 Shortness of breath: Secondary | ICD-10-CM | POA: Diagnosis not present

## 2017-05-08 DIAGNOSIS — D631 Anemia in chronic kidney disease: Secondary | ICD-10-CM | POA: Insufficient documentation

## 2017-05-08 LAB — BASIC METABOLIC PANEL
ANION GAP: 14 (ref 5–15)
BUN: 89 mg/dL — ABNORMAL HIGH (ref 6–20)
CALCIUM: 9.5 mg/dL (ref 8.9–10.3)
CO2: 22 mmol/L (ref 22–32)
Chloride: 102 mmol/L (ref 101–111)
Creatinine, Ser: 15.45 mg/dL — ABNORMAL HIGH (ref 0.61–1.24)
GFR calc Af Amer: 4 mL/min — ABNORMAL LOW (ref >60)
GFR calc non Af Amer: 3 mL/min — ABNORMAL LOW (ref >60)
GLUCOSE: 96 mg/dL (ref 65–99)
POTASSIUM: 3.7 mmol/L (ref 3.5–5.1)
SODIUM: 138 mmol/L (ref 135–145)

## 2017-05-08 LAB — CBC
HEMATOCRIT: 28.3 % — AB (ref 39.0–52.0)
HEMOGLOBIN: 9.4 g/dL — AB (ref 13.0–17.0)
MCH: 29.9 pg (ref 26.0–34.0)
MCHC: 33.2 g/dL (ref 30.0–36.0)
MCV: 90.1 fL (ref 78.0–100.0)
Platelets: 172 10*3/uL (ref 150–400)
RBC: 3.14 MIL/uL — ABNORMAL LOW (ref 4.22–5.81)
RDW: 15.4 % (ref 11.5–15.5)
WBC: 5.8 10*3/uL (ref 4.0–10.5)

## 2017-05-08 LAB — I-STAT TROPONIN, ED: TROPONIN I, POC: 0.12 ng/mL — AB (ref 0.00–0.08)

## 2017-05-08 NOTE — ED Notes (Signed)
Patient refusing xray at this time.  States that all he needs is dialysis, he doesn't need anything else.

## 2017-05-08 NOTE — ED Notes (Signed)
Jessica F.-RN@[facility]  notified of elevated trop

## 2017-05-08 NOTE — ED Triage Notes (Signed)
Last dialysis 05-04-17, missed 05-06-17 because pt was out of town.  Pt feels like "fluid is building up", short of breath.

## 2017-05-09 ENCOUNTER — Emergency Department (HOSPITAL_COMMUNITY)
Admission: EM | Admit: 2017-05-09 | Discharge: 2017-05-09 | Disposition: A | Discharge status: Home / Self Care | Payer: Medicaid Other | Attending: Emergency Medicine | Admitting: Emergency Medicine

## 2017-05-09 ENCOUNTER — Emergency Department (HOSPITAL_COMMUNITY): Payer: Medicaid Other

## 2017-05-09 DIAGNOSIS — N186 End stage renal disease: Secondary | ICD-10-CM

## 2017-05-09 DIAGNOSIS — D631 Anemia in chronic kidney disease: Secondary | ICD-10-CM

## 2017-05-09 DIAGNOSIS — R7989 Other specified abnormal findings of blood chemistry: Secondary | ICD-10-CM

## 2017-05-09 DIAGNOSIS — Z992 Dependence on renal dialysis: Secondary | ICD-10-CM

## 2017-05-09 DIAGNOSIS — R778 Other specified abnormalities of plasma proteins: Secondary | ICD-10-CM

## 2017-05-09 DIAGNOSIS — R0602 Shortness of breath: Secondary | ICD-10-CM

## 2017-05-09 LAB — I-STAT TROPONIN, ED: TROPONIN I, POC: 0.11 ng/mL — AB (ref 0.00–0.08)

## 2017-05-09 MED ORDER — AMLODIPINE BESYLATE 5 MG PO TABS
10.0000 mg | ORAL_TABLET | Freq: Once | ORAL | Status: AC
  Administered 2017-05-09: 10 mg via ORAL
  Filled 2017-05-09: qty 2

## 2017-05-09 NOTE — Discharge Instructions (Signed)
Go for your dialysis today, as scheduled. 

## 2017-05-09 NOTE — ED Notes (Signed)
Patient transported to X-ray 

## 2017-05-09 NOTE — ED Provider Notes (Signed)
[facility] DEPT Provider Note   CSN: [id number] Arrival date & time: 05/08/17  2023     History   Chief Complaint Chief Complaint  Patient presents with  . Shortness of Breath    HPI Devin Bullock is a 51 y.o. male.  The history is provided by the patient.  Shortness of Breath   He has a history of end-stage renal disease on hemodialysis, and missed his dialysis session 2 days ago. He is scheduled for dialysis at 1145 tomorrow. This afternoon, he noted onset of dyspnea which was worse when he lays flat. He knows that this is because of fluid retention. Denies chest pain, heaviness, tightness, pressure. He denies nausea or vomiting. He is coming to the ED asking to be dialyzed. Of note, he did contact his dialysis center to when he missed his appointment to CV could be scheduled at the next day, but they were unable to accommodate this request.  Past Medical History:  Diagnosis Date  . Chronic kidney disease   . Enlarged heart   . Hypertension   . Morbid obesity [facility])     Patient Active Problem List   Diagnosis Date Noted  . CKD (chronic kidney disease) 07/03/2014  . End stage renal disease ([place]) 08/08/2013  . Chronic kidney disease (CKD), stage IV (severe) ([place]) 04/17/2013  . Patient nonadherence 04/17/2013  . Morbid obesity ([place]) 04/15/2013  . Anemia 04/15/2013  . Cardiomegaly 04/13/2013  . Hypertensive emergency 04/13/2013  . Acute on chronic combined systolic and diastolic CHF (congestive heart failure) ([place]) 04/13/2013    Past Surgical History:  Procedure Laterality Date  . AV FISTULA PLACEMENT Left 05/24/2014   Procedure: BRACHIOCEPHALIC ARTERIOVENOUS (AV) FISTULA CREATION;  Surgeon: Angelia Mould, MD;  Location: [place];  Service: Vascular;  Laterality: Left;  . None         Home Medications    Prior to Admission medications   Medication Sig Start Date End Date Taking? Authorizing Provider  amLODipine (NORVASC) 10 MG tablet TAKE 1 TABLET BY MOUTH  DAILY. 03/31/15   Lance Bosch, NP  calcitRIOL (ROCALTROL) 0.25 MCG capsule Take 1 capsule (0.25 mcg total) by mouth daily. Patient not taking: Reported on 01/10/2017 06/13/14   Lance Bosch, NP  calcium acetate (PHOSLO) 667 MG capsule Take 1,334 mg by mouth 3 (three) times daily with meals.    [provider]  furosemide (LASIX) 80 MG tablet Take 1 tablet (80 mg total) by mouth 2 (two) times daily. Take 160 mg in the am & 80 mg in the pm Patient not taking: Reported on 01/10/2017 06/13/14   Lance Bosch, NP  hydrALAZINE (APRESOLINE) 10 MG tablet Take 1 tablet (10 mg total) by mouth 3 (three) times daily. 01/10/17   Charlesetta Shanks, MD  isosorbide mononitrate (IMDUR) 30 MG 24 hr tablet TAKE 1 TABLET BY MOUTH DAILY. Patient not taking: Reported on 01/10/2017 01/03/15   Lance Bosch, NP  labetalol (NORMODYNE) 300 MG tablet Take 1 tablet (300 mg total) by mouth 3 (three) times daily. Patient not taking: Reported on 01/10/2017 06/13/14   Lance Bosch, NP  Multiple Vitamin (MULTIVITAMIN WITH MINERALS) TABS tablet Take 1 tablet by mouth daily.    [provider]  oxyCODONE (ROXICODONE) 5 MG immediate release tablet Take 1 tablet (5 mg total) by mouth every 6 (six) hours as needed for severe pain. Patient not taking: Reported on 08/06/2014 05/24/14   Gabriel Earing, PA-C  sildenafil (VIAGRA) 25 MG  tablet Take 1 tablet (25 mg total) by mouth daily as needed for erectile dysfunction. 08/06/14   Lance Bosch, NP    Family History Family History  Problem Relation Age of Onset  . Diabetes Mother   . Hypertension Mother   . Heart attack Mother 82  . Stroke Father   . Hypertension Father   . Depression Father   . Diabetes Brother   . Hypertension Brother   . Stroke Brother 48  . Alcohol abuse Brother   . Asthma Brother     Social History Social History  Substance Use Topics  . Smoking status: Never Smoker  . Smokeless tobacco: Never Used  . Alcohol use No      Allergies   Patient has no known allergies.   Review of Systems Review of Systems  Respiratory: Positive for shortness of breath.   All other systems reviewed and are negative.    Physical Exam Updated Vital Signs BP (!) 144/88 (BP Location: Right Arm)   Pulse 91   Temp 98.3 F (36.8 C) (Oral)   Resp 18   Ht 6\' 4"  (1.93 m)   Wt (!) 145.1 kg (319 lb 14.4 oz)   SpO2 98%   BMI 38.94 kg/m   Physical Exam  Nursing note and vitals reviewed.  51 year old male, resting comfortably and in no acute distress. Vital signs are significant for hypertension. Oxygen saturation is 98%, which is normal. Head is normocephalic and atraumatic. PERRLA, EOMI. Oropharynx is clear. Neck is nontender and supple without adenopathy or JVD. Back is nontender and there is no CVA tenderness. Lungs are clear without rales, wheezes, or rhonchi. Chest is nontender. Heart has regular rate and rhythm without murmur. Abdomen is soft, flat, nontender without masses or hepatosplenomegaly and peristalsis is normoactive. Extremities have 1+ edema, full range of motion is present. AV fistula is present in left forearm with thrill present. Skin is warm and dry without rash. Neurologic: Mental status is normal, cranial nerves are intact, there are no motor or sensory deficits.  ED Treatments / Results  Labs (all labs ordered are listed, but only abnormal results are displayed) Labs Reviewed  BASIC METABOLIC PANEL - Abnormal; Notable for the following:       Result Value   BUN 89 (*)    Creatinine, Ser 15.45 (*)    GFR calc non Af Amer 3 (*)    GFR calc Af Amer 4 (*)    All other components within normal limits  CBC - Abnormal; Notable for the following:    RBC 3.14 (*)    Hemoglobin 9.4 (*)    HCT 28.3 (*)    All other components within normal limits  I-STAT TROPONIN, ED - Abnormal; Notable for the following:    Troponin i, poc 0.12 (*)    All other components within normal limits    EKG   EKG Interpretation None       Radiology No results found.  Procedures Procedures (including critical care time)  Medications Ordered in ED Medications  amLODipine (NORVASC) tablet 10 mg (not administered)     Initial Impression / Assessment and Plan / ED Course  I have reviewed the triage vital signs and the nursing notes.  Pertinent labs & imaging results that were available during my care of the patient were reviewed by me and considered in my medical decision making (see chart for details).  Dyspnea in patient with end-stage renal disease who missed dialysis session. No  overt pulmonary edema, but clearly fluid overloaded. Chest x-ray has been ordered. Laboratory evaluation shows normal potassium, mildly elevated troponin. Troponin has been elevated to a greater extent the last time it was checked in 2014, when he was admitted for hypertensive urgency and not felt to have had ACS. I do suspect that this is a mild, chronic elevation of troponin secondary to renal disease. We'll need to check delta troponin. At the moment, no indication for emergent dialysis.  Repeat troponin has asked a declined slightly. This is felt to be related to his underlying renal failure. He has maintain adequate oxygen saturation throughout his stay in the ED. He is discharged with instructions to go for his dialysis session later this morning, as scheduled.  Final Clinical Impressions(s) / ED Diagnoses   Final diagnoses:  SOB (shortness of breath)  End-stage renal disease on hemodialysis (HCC)  Elevated troponin I level  Anemia in chronic kidney disease, on chronic dialysis [facility])    New Prescriptions New Prescriptions   No medications on file     Delora Fuel, MD 40/37/54 [phone]

## 2017-05-09 NOTE — ED Notes (Signed)
Pt wanted to leave AMA.  Dr. Roxanne Mins to bedside to explain the concern about his elevated troponin and his proposed course of treatment.  Risks and benefits explained by Dr. Roxanne Mins. Pt has opted to not leave AMA.  Pt is now agreeable to chest x-ray.  X-ray notified and pt was put back on list. Will continue to monitor.

## 2017-05-09 NOTE — ED Notes (Signed)
ED Provider at bedside. 

## 2017-11-24 ENCOUNTER — Emergency Department (HOSPITAL_COMMUNITY)
Admission: EM | Admit: 2017-11-24 | Discharge: 2017-11-24 | Disposition: A | Discharge status: Home / Self Care | Payer: Medicaid Other | Attending: Emergency Medicine | Admitting: Emergency Medicine

## 2017-11-24 ENCOUNTER — Encounter (HOSPITAL_COMMUNITY): Payer: Self-pay

## 2017-11-24 DIAGNOSIS — Z79899 Other long term (current) drug therapy: Secondary | ICD-10-CM | POA: Diagnosis not present

## 2017-11-24 DIAGNOSIS — N184 Chronic kidney disease, stage 4 (severe): Secondary | ICD-10-CM | POA: Insufficient documentation

## 2017-11-24 DIAGNOSIS — N3001 Acute cystitis with hematuria: Secondary | ICD-10-CM | POA: Diagnosis not present

## 2017-11-24 DIAGNOSIS — I130 Hypertensive heart and chronic kidney disease with heart failure and stage 1 through stage 4 chronic kidney disease, or unspecified chronic kidney disease: Secondary | ICD-10-CM | POA: Diagnosis not present

## 2017-11-24 DIAGNOSIS — R319 Hematuria, unspecified: Secondary | ICD-10-CM | POA: Diagnosis present

## 2017-11-24 DIAGNOSIS — N3091 Cystitis, unspecified with hematuria: Secondary | ICD-10-CM

## 2017-11-24 DIAGNOSIS — I129 Hypertensive chronic kidney disease with stage 1 through stage 4 chronic kidney disease, or unspecified chronic kidney disease: Secondary | ICD-10-CM | POA: Diagnosis not present

## 2017-11-24 DIAGNOSIS — I5043 Acute on chronic combined systolic (congestive) and diastolic (congestive) heart failure: Secondary | ICD-10-CM | POA: Insufficient documentation

## 2017-11-24 HISTORY — DX: Disorder of thyroid, unspecified: E07.9

## 2017-11-24 LAB — URINALYSIS, ROUTINE W REFLEX MICROSCOPIC
Bilirubin Urine: NEGATIVE
Glucose, UA: 150 mg/dL — AB
KETONES UR: 5 mg/dL — AB
NITRITE: NEGATIVE
PH: 8 (ref 5.0–8.0)
Protein, ur: >=300 mg/dL — AB
SPECIFIC GRAVITY, URINE: 1.009 (ref 1.005–1.030)
Squamous Epithelial / LPF: NONE SEEN

## 2017-11-24 MED ORDER — CEPHALEXIN 500 MG PO CAPS: 500.0000 mg | ORAL_CAPSULE | Freq: Three times a day (TID) | ORAL | 0 refills | Status: DC

## 2017-11-24 NOTE — Discharge Instructions (Signed)
Follow up next week with urology

## 2017-11-24 NOTE — ED Triage Notes (Signed)
Pt reports noticing blood in his urine since yesterday. Pt denies dysuria and flank pain. Pt had dialysis yesterday w/o incident.

## 2017-11-24 NOTE — ED Provider Notes (Signed)
[facility] DEPT Provider Note   CSN: [id number] Arrival date & time: 11/24/17  1147     History   Chief Complaint Chief Complaint  Patient presents with  . Hematruia    HPI Devin Bullock is a 52 y.o. male.  Patient complains of blood in his urine.  Patient does not take any blood thinners but gets heparin when he has dialysis.  Patient has had UTI before  The history is provided by the patient.  Hematuria  This is a new problem. The current episode started more than 2 days ago. The problem occurs rarely. The problem has not changed since onset.Pertinent negatives include no chest pain, no abdominal pain and no headaches. Nothing aggravates the symptoms. Nothing relieves the symptoms. He has tried nothing for the symptoms. The treatment provided no relief.    Past Medical History:  Diagnosis Date  . Chronic kidney disease   . Enlarged heart   . Hypertension   . Morbid obesity ([place])   . Thyroid disease     Patient Active Problem List   Diagnosis Date Noted  . CKD (chronic kidney disease) 07/03/2014  . End stage renal disease ([place]) 08/08/2013  . Chronic kidney disease (CKD), stage IV (severe) ([place]) 04/17/2013  . Patient nonadherence 04/17/2013  . Morbid obesity ([place]) 04/15/2013  . Anemia 04/15/2013  . Cardiomegaly 04/13/2013  . Hypertensive emergency 04/13/2013  . Acute on chronic combined systolic and diastolic CHF (congestive heart failure) ([place]) 04/13/2013    Past Surgical History:  Procedure Laterality Date  . AV FISTULA PLACEMENT Left 05/24/2014   Procedure: BRACHIOCEPHALIC ARTERIOVENOUS (AV) FISTULA CREATION;  Surgeon: Angelia Mould, MD;  Location: [facility];  Service: Vascular;  Laterality: Left;  . None          Home Medications    Prior to Admission medications   Medication Sig Start Date End Date Taking? Authorizing Provider  cinacalcet (SENSIPAR) 60 MG tablet Take 60 mg by mouth daily.   Yes [provider]   lanthanum (FOSRENOL) 1000 MG chewable tablet Chew 1,000 mg by mouth 3 (three) times daily with meals.   Yes [provider]  Multiple Vitamin (MULTIVITAMIN WITH MINERALS) TABS tablet Take 1 tablet by mouth daily.   Yes [provider]  sildenafil (VIAGRA) 25 MG tablet Take 1 tablet (25 mg total) by mouth daily as needed for erectile dysfunction. 08/06/14  Yes Chari Manning A, NP  amLODipine (NORVASC) 10 MG tablet TAKE 1 TABLET BY MOUTH DAILY. Patient not taking: Reported on 11/24/2017 03/31/15   Lance Bosch, NP  calcitRIOL (ROCALTROL) 0.25 MCG capsule Take 1 capsule (0.25 mcg total) by mouth daily. Patient not taking: Reported on 01/10/2017 06/13/14   Lance Bosch, NP  calcium acetate (PHOSLO) 667 MG capsule Take 1,334 mg by mouth 3 (three) times daily with meals.    [provider]  cephALEXin (KEFLEX) 500 MG capsule Take 1 capsule (500 mg total) by mouth 3 (three) times daily. 11/24/17   Milton Ferguson, MD  furosemide (LASIX) 80 MG tablet Take 1 tablet (80 mg total) by mouth 2 (two) times daily. Take 160 mg in the am & 80 mg in the pm Patient not taking: Reported on 11/24/2017 06/13/14   Lance Bosch, NP  hydrALAZINE (APRESOLINE) 10 MG tablet Take 1 tablet (10 mg total) by mouth 3 (three) times daily. Patient not taking: Reported on 05/09/2017 01/10/17   Charlesetta Shanks, MD  isosorbide mononitrate (IMDUR) 30 MG 24  hr tablet TAKE 1 TABLET BY MOUTH DAILY. Patient not taking: Reported on 01/10/2017 01/03/15   Lance Bosch, NP  labetalol (NORMODYNE) 300 MG tablet Take 1 tablet (300 mg total) by mouth 3 (three) times daily. Patient not taking: Reported on 01/10/2017 06/13/14   Lance Bosch, NP  oxyCODONE (ROXICODONE) 5 MG immediate release tablet Take 1 tablet (5 mg total) by mouth every 6 (six) hours as needed for severe pain. Patient not taking: Reported on 08/06/2014 05/24/14   Gabriel Earing, PA-C    Family History Family History  Problem Relation Age of  Onset  . Diabetes Mother   . Hypertension Mother   . Heart attack Mother 70  . Stroke Father   . Hypertension Father   . Depression Father   . Diabetes Brother   . Hypertension Brother   . Stroke Brother 48  . Alcohol abuse Brother   . Asthma Brother     Social History Social History   Tobacco Use  . Smoking status: Never Smoker  . Smokeless tobacco: Never Used  Substance Use Topics  . Alcohol use: No  . Drug use: No     Allergies   Patient has no known allergies.   Review of Systems Review of Systems  Constitutional: Negative for appetite change and fatigue.  HENT: Negative for congestion, ear discharge and sinus pressure.   Eyes: Negative for discharge.  Respiratory: Negative for cough.   Cardiovascular: Negative for chest pain.  Gastrointestinal: Negative for abdominal pain and diarrhea.  Genitourinary: Positive for hematuria. Negative for frequency.  Musculoskeletal: Negative for back pain.  Skin: Negative for rash.  Neurological: Negative for seizures and headaches.  Psychiatric/Behavioral: Negative for hallucinations.     Physical Exam Updated Vital Signs BP (!) 173/112   Pulse 99   Temp 98.9 F (37.2 C) (Oral)   Resp 18   Ht 6\' 4"  (1.93 m)   Wt (!) 140.2 kg (309 lb)   SpO2 92%   BMI 37.61 kg/m   Physical Exam  Constitutional: He is oriented to person, place, and time. He appears well-developed.  HENT:  Head: Normocephalic.  Eyes: Conjunctivae and EOM are normal. No scleral icterus.  Neck: Neck supple. No thyromegaly present.  Cardiovascular: Normal rate and regular rhythm. Exam reveals no gallop and no friction rub.  No murmur heard. Pulmonary/Chest: No stridor. He has no wheezes. He has no rales. He exhibits no tenderness.  Abdominal: He exhibits no distension. There is no tenderness. There is no rebound.  Musculoskeletal: Normal range of motion. He exhibits no edema.  Lymphadenopathy:    He has no cervical adenopathy.  Neurological: He  is oriented to person, place, and time. He exhibits normal muscle tone. Coordination normal.  Skin: No rash noted. No erythema.  Psychiatric: He has a normal mood and affect. His behavior is normal.     ED Treatments / Results  Labs (all labs ordered are listed, but only abnormal results are displayed) Labs Reviewed  URINALYSIS, ROUTINE W REFLEX MICROSCOPIC - Abnormal; Notable for the following components:      Result Value   Color, Urine RED (*)    APPearance CLOUDY (*)    Glucose, UA 150 (*)    Hgb urine dipstick MODERATE (*)    Ketones, ur 5 (*)    Protein, ur >=300 (*)    Leukocytes, UA SMALL (*)    Bacteria, UA FEW (*)    All other components within normal limits  URINE CULTURE  EKG None  Radiology No results found.  Procedures Procedures (including critical care time)  Medications Ordered in ED Medications - No data to display   Initial Impression / Assessment and Plan / ED Course  I have reviewed the triage vital signs and the nursing notes.  Pertinent labs & imaging results that were available during my care of the patient were reviewed by me and considered in my medical decision making (see chart for details).     Patient with hematuria.  We will culture his urine.  Patient will be empirically placed on Keflex and is referred to urology  Final Clinical Impressions(s) / ED Diagnoses   Final diagnoses:  Hematuria due to cystitis    ED Discharge Orders        Ordered    cephALEXin (KEFLEX) 500 MG capsule  3 times daily     11/24/17 1543       Milton Ferguson, MD 11/24/17 1548

## 2017-11-25 LAB — URINE CULTURE
Culture: >=100000 — AB
SPECIAL REQUESTS: NORMAL

## 2017-11-26 ENCOUNTER — Telehealth: Payer: Self-pay

## 2017-11-26 NOTE — Telephone Encounter (Signed)
Post ED Visit - Positive Culture Follow-up  Culture report reviewed by antimicrobial stewardship pharmacist:  []  Elenor Quinones, Pharm.D. []  Heide Guile, Pharm.D., BCPS AQ-ID []  Parks Neptune, Pharm.D., BCPS []  Alycia Rossetti, Pharm.D., BCPS []  [place], [place].D., BCPS, AAHIVP []  Legrand Como, Pharm.D., BCPS, AAHIVP []  Salome Arnt, PharmD, BCPS []  Jalene Mullet, PharmD []  Vincenza Hews, PharmD, BCPS [name] Pharm D Positive urine culture Treated with Cephalexin, organism sensitive to the same and no further patient follow-up is required at this time.  Genia Del 11/26/2017, 10:33 AM

## 2018-02-09 ENCOUNTER — Encounter: Payer: Self-pay | Admitting: Podiatry

## 2018-02-09 ENCOUNTER — Ambulatory Visit: Payer: Medicaid Other | Admitting: Podiatry

## 2018-02-09 DIAGNOSIS — N186 End stage renal disease: Secondary | ICD-10-CM

## 2018-02-09 DIAGNOSIS — B353 Tinea pedis: Secondary | ICD-10-CM

## 2018-02-09 DIAGNOSIS — B351 Tinea unguium: Secondary | ICD-10-CM | POA: Diagnosis not present

## 2018-02-09 DIAGNOSIS — Z992 Dependence on renal dialysis: Secondary | ICD-10-CM

## 2018-02-09 MED ORDER — KETOCONAZOLE 2 % EX CREA: TOPICAL_CREAM | CUTANEOUS | 0 refills | Status: DC

## 2018-02-09 NOTE — Progress Notes (Signed)
  Subjective:  Patient ID: Devin Bullock, male    DOB: 1965-12-12,  MRN: [id number]  Chief Complaint  Patient presents with  . Tinea Pedis    bilateral; pt stated, "been dealing with it for about a year now, fungus has gotten under my toenails now"   52 y.o. male returns for the above complaint. Reports thickened and discolored nails. Complains of fungus under the toenails and between his toes.  Objective:  There were no vitals filed for this visit. General AA&O x3. Normal mood and affect.  Vascular Pedal pulses palpable.  Neurologic Epicritic sensation grossly intact.  Dermatologic No open lesions. Skin normal texture and turgor. Toenails x 10 elongated, thickened, dystrophic. Interdigital maceration noted.  Orthopedic: Pain to palpation about the toenails.   Assessment & Plan:  Patient was evaluated and treated and all questions answered.  Onychomycosis with ESRD on HD; Tinea Pedis. -Nails palliatively debrided as below. -Educated on self-care -Rx Ketoconazole  Procedure: Nail Debridement Rationale: pain  Type of Debridement: manual, sharp debridement. Instrumentation: Nail nipper, rotary burr. Number of Nails: 10     Return in about 3 months (around 05/12/2018) for Routine Foot Care - on dialysis.

## 2018-11-28 DIAGNOSIS — N2581 Secondary hyperparathyroidism of renal origin: Secondary | ICD-10-CM | POA: Insufficient documentation

## 2019-04-02 DIAGNOSIS — N390 Urinary tract infection, site not specified: Secondary | ICD-10-CM | POA: Insufficient documentation

## 2019-05-04 ENCOUNTER — Other Ambulatory Visit: Payer: Self-pay

## 2019-05-04 ENCOUNTER — Emergency Department (HOSPITAL_COMMUNITY)
Admission: EM | Admit: 2019-05-04 | Discharge: 2019-05-05 | Disposition: A | Discharge status: Home / Self Care | Payer: Medicaid Other | Attending: Emergency Medicine | Admitting: Emergency Medicine

## 2019-05-04 ENCOUNTER — Encounter (HOSPITAL_COMMUNITY): Payer: Self-pay

## 2019-05-04 DIAGNOSIS — N390 Urinary tract infection, site not specified: Secondary | ICD-10-CM | POA: Insufficient documentation

## 2019-05-04 DIAGNOSIS — Y828 Other medical devices associated with adverse incidents: Secondary | ICD-10-CM | POA: Diagnosis not present

## 2019-05-04 DIAGNOSIS — T839XXA Unspecified complication of genitourinary prosthetic device, implant and graft, initial encounter: Secondary | ICD-10-CM

## 2019-05-04 DIAGNOSIS — N186 End stage renal disease: Secondary | ICD-10-CM | POA: Diagnosis not present

## 2019-05-04 DIAGNOSIS — Z79899 Other long term (current) drug therapy: Secondary | ICD-10-CM | POA: Insufficient documentation

## 2019-05-04 DIAGNOSIS — T83511A Infection and inflammatory reaction due to indwelling urethral catheter, initial encounter: Secondary | ICD-10-CM | POA: Diagnosis not present

## 2019-05-04 DIAGNOSIS — I5042 Chronic combined systolic (congestive) and diastolic (congestive) heart failure: Secondary | ICD-10-CM | POA: Insufficient documentation

## 2019-05-04 DIAGNOSIS — I132 Hypertensive heart and chronic kidney disease with heart failure and with stage 5 chronic kidney disease, or end stage renal disease: Secondary | ICD-10-CM | POA: Insufficient documentation

## 2019-05-04 DIAGNOSIS — Z992 Dependence on renal dialysis: Secondary | ICD-10-CM | POA: Diagnosis not present

## 2019-05-04 DIAGNOSIS — N4889 Other specified disorders of penis: Secondary | ICD-10-CM | POA: Diagnosis present

## 2019-05-04 LAB — CBC WITH DIFFERENTIAL/PLATELET
Abs Immature Granulocytes: 0.03 10*3/uL (ref 0.00–0.07)
Basophils Absolute: 0 10*3/uL (ref 0.0–0.1)
Basophils Relative: 0 %
Eosinophils Absolute: 0.1 10*3/uL (ref 0.0–0.5)
Eosinophils Relative: 2 %
HCT: 30.8 % — ABNORMAL LOW (ref 39.0–52.0)
Hemoglobin: 9.9 g/dL — ABNORMAL LOW (ref 13.0–17.0)
Immature Granulocytes: 0 %
Lymphocytes Relative: 14 %
Lymphs Abs: 1 10*3/uL (ref 0.7–4.0)
MCH: 29.6 pg (ref 26.0–34.0)
MCHC: 32.1 g/dL (ref 30.0–36.0)
MCV: 91.9 fL (ref 80.0–100.0)
Monocytes Absolute: 1 10*3/uL (ref 0.1–1.0)
Monocytes Relative: 14 %
Neutro Abs: 4.9 10*3/uL (ref 1.7–7.7)
Neutrophils Relative %: 70 %
Platelets: 140 10*3/uL — ABNORMAL LOW (ref 150–400)
RBC: 3.35 MIL/uL — ABNORMAL LOW (ref 4.22–5.81)
RDW: 13.9 % (ref 11.5–15.5)
WBC: 7.1 10*3/uL (ref 4.0–10.5)
nRBC: 0 % (ref 0.0–0.2)

## 2019-05-04 LAB — URINALYSIS, ROUTINE W REFLEX MICROSCOPIC
Bilirubin Urine: NEGATIVE
Bilirubin Urine: NEGATIVE
Glucose, UA: NEGATIVE mg/dL
Glucose, UA: 50 mg/dL — AB
Hgb urine dipstick: NEGATIVE
Ketones, ur: NEGATIVE mg/dL
Ketones, ur: NEGATIVE mg/dL
Leukocytes,Ua: NEGATIVE
Nitrite: NEGATIVE
Nitrite: NEGATIVE
Protein, ur: NEGATIVE mg/dL
Protein, ur: >=300 mg/dL — AB
Specific Gravity, Urine: 1.001 — ABNORMAL LOW (ref 1.005–1.030)
Specific Gravity, Urine: 1.009 (ref 1.005–1.030)
WBC, UA: >50 WBC/hpf — ABNORMAL HIGH (ref 0–5)
pH: 6 (ref 5.0–8.0)
pH: 7 (ref 5.0–8.0)

## 2019-05-04 LAB — COMPREHENSIVE METABOLIC PANEL
ALT: 18 U/L (ref 0–44)
AST: 21 U/L (ref 15–41)
Albumin: 3.8 g/dL (ref 3.5–5.0)
Alkaline Phosphatase: 61 U/L (ref 38–126)
Anion gap: 20 — ABNORMAL HIGH (ref 5–15)
BUN: 85 mg/dL — ABNORMAL HIGH (ref 6–20)
CO2: 21 mmol/L — ABNORMAL LOW (ref 22–32)
Calcium: 9.1 mg/dL (ref 8.9–10.3)
Chloride: 95 mmol/L — ABNORMAL LOW (ref 98–111)
Creatinine, Ser: 18.56 mg/dL — ABNORMAL HIGH (ref 0.61–1.24)
GFR calc Af Amer: 3 mL/min — ABNORMAL LOW (ref >60)
GFR calc non Af Amer: 2 mL/min — ABNORMAL LOW (ref >60)
Glucose, Bld: 106 mg/dL — ABNORMAL HIGH (ref 70–99)
Potassium: 3.7 mmol/L (ref 3.5–5.1)
Sodium: 136 mmol/L (ref 135–145)
Total Bilirubin: 0.5 mg/dL (ref 0.3–1.2)
Total Protein: 7.5 g/dL (ref 6.5–8.1)

## 2019-05-04 MED ORDER — CEPHALEXIN 250 MG PO CAPS: 250.0000 mg | ORAL_CAPSULE | Freq: Two times a day (BID) | ORAL | 0 refills | Status: AC

## 2019-05-04 MED ORDER — CEPHALEXIN 250 MG PO CAPS: 250.0000 mg | ORAL_CAPSULE | Freq: Two times a day (BID) | ORAL | 0 refills | Status: DC

## 2019-05-04 NOTE — Discharge Instructions (Addendum)
Today your urine was checked and was sent for a culture.  Your Foley catheter was replaced.  Please follow-up with your urologist.  You did have some abnormalities in your labs today and it is important that you go to dialysis tomorrow.  You may have diarrhea from the antibiotics.  It is very important that you continue to take the antibiotics even if you get diarrhea unless a medical professional tells you that you may stop taking them.  If you stop too early the bacteria you are being treated for will become stronger and you may need different, more powerful antibiotics that have more side effects and worsening diarrhea.  Please stay well hydrated and consider probiotics as they may decrease the severity of your diarrhea.

## 2019-05-04 NOTE — ED Provider Notes (Signed)
[facility] DEPT Provider Note   CSN: [id number] Arrival date & time: 05/04/19  1646     History   Chief Complaint Chief Complaint  Patient presents with   Foley Issue    HPI Devin Bullock is a 53 y.o. male with a history of ESRD on HD Monday Wednesday Friday, hypertension, cardiomegaly, morbid obesity, CHF, who presents today for penile pain.  He had a Foley catheter placed in June 2020 and says that it has not been changed since then.  He called his urologist office today where he states that the nurse told him that it should be changed every month.  He reported fever to triage however did not report this to me.  He reports that he has pain in his penis, denies any pain in his testicles.  He denies any penile lesions.  He reports that he normally has approximately 1 L of urine output per day.  He denies any changes to this recently.  He is requesting that his catheter be replaced today.  He denies any nausea or vomiting.  He denies any other complaints or concerns today.  He does report that he missed his last 2 dialysis sessions however has a session scheduled for 05/05/2019 at 6 AM.  He denies any abnormal shortness of breath, leg swelling or other complaints/concerns today.     HPI  Past Medical History:  Diagnosis Date   Chronic kidney disease    Enlarged heart    Hypertension    Morbid obesity ([place])    Thyroid disease     Patient Active Problem List   Diagnosis Date Noted   CKD (chronic kidney disease) 07/03/2014   End stage renal disease ([place]) 08/08/2013   Chronic kidney disease (CKD), stage IV (severe) ([place]) 04/17/2013   Patient nonadherence 04/17/2013   Morbid obesity ([place]) 04/15/2013   Anemia 04/15/2013   Cardiomegaly 04/13/2013   Hypertensive emergency 04/13/2013   Acute on chronic combined systolic and diastolic CHF (congestive heart failure) ([place]) 04/13/2013    Past Surgical History:  Procedure Laterality Date     AV FISTULA PLACEMENT Left 05/24/2014   Procedure: BRACHIOCEPHALIC ARTERIOVENOUS (AV) FISTULA CREATION;  Surgeon: Angelia Mould, MD;  [facility];  Service: Vascular;  Laterality: Left;   None          Home Medications    Prior to Admission medications   Medication Sig Start Date End Date Taking? Authorizing Provider  amLODipine (NORVASC) 10 MG tablet TAKE 1 TABLET BY MOUTH DAILY. Patient taking differently: Take 10 mg by mouth daily.  03/31/15  Yes Lance Bosch, NP  labetalol (NORMODYNE) 300 MG tablet Take 1 tablet (300 mg total) by mouth 3 (three) times daily. 06/13/14  Yes Lance Bosch, NP  Multiple Vitamin (MULTIVITAMIN WITH MINERALS) TABS tablet Take 1 tablet by mouth daily.   Yes [provider]  multivitamin (RENA-VIT) TABS tablet Take 1 tablet by mouth daily. 02/01/19  Yes [provider]  RENAGEL 800 MG tablet Take 800-2,400 mg by mouth See admin instructions. Take 3 tablets with meals 3 times daily and 1 tablet twice daily with snacks. 02/01/18  Yes [provider]  calcitRIOL (ROCALTROL) 0.25 MCG capsule Take 1 capsule (0.25 mcg total) by mouth daily. Patient not taking: Reported on 05/04/2019 06/13/14   Lance Bosch, NP  cephALEXin (KEFLEX) 250 MG capsule Take 1 capsule (250 mg total) by mouth 2 (two) times daily for 10 days. 05/04/19 05/14/19  Phylliss Bob,  Ree Shay, PA-C  furosemide (LASIX) 80 MG tablet Take 1 tablet (80 mg total) by mouth 2 (two) times daily. Take 160 mg in the am & 80 mg in the pm Patient not taking: Reported on 05/04/2019 06/13/14   Lance Bosch, NP  hydrALAZINE (APRESOLINE) 10 MG tablet Take 1 tablet (10 mg total) by mouth 3 (three) times daily. Patient not taking: Reported on 05/04/2019 01/10/17   Charlesetta Shanks, MD  ketoconazole (NIZORAL) 2 % cream Apply 1 fingertip amount to each foot daily. Patient not taking: Reported on 05/04/2019 02/09/18   Evelina Bucy, DPM  oxyCODONE (ROXICODONE) 5 MG immediate  release tablet Take 1 tablet (5 mg total) by mouth every 6 (six) hours as needed for severe pain. Patient not taking: Reported on 05/04/2019 05/24/14   Gabriel Earing, PA-C  sildenafil (VIAGRA) 25 MG tablet Take 1 tablet (25 mg total) by mouth daily as needed for erectile dysfunction. Patient not taking: Reported on 05/04/2019 08/06/14   Lance Bosch, NP    Family History Family History  Problem Relation Age of Onset   Diabetes Mother    Hypertension Mother    Heart attack Mother 82   Stroke Father    Hypertension Father    Depression Father    Diabetes Brother    Hypertension Brother    Stroke Brother 20   Alcohol abuse Brother    Asthma Brother     Social History Social History   Tobacco Use   Smoking status: Never Smoker   Smokeless tobacco: Never Used  Substance Use Topics   Alcohol use: No   Drug use: No     Allergies   Patient has no known allergies.   Review of Systems Review of Systems  Constitutional: Negative for chills and fever.  Respiratory: Negative for cough, chest tightness and shortness of breath.   Cardiovascular: Negative for chest pain.  Gastrointestinal: Negative for abdominal pain, diarrhea, nausea and vomiting.  Genitourinary: Positive for penile pain. Negative for decreased urine volume, difficulty urinating, testicular pain and urgency.  Neurological: Negative for headaches.  All other systems reviewed and are negative.    Physical Exam Updated Vital Signs BP (!) 159/101 (BP Location: Left Arm)    Pulse 97    Temp 98.3 F (36.8 C) (Oral)    Resp 20    Ht 6\' 4"  (1.93 m)    Wt 136.1 kg    SpO2 98%    BMI 36.52 kg/m   Physical Exam Vitals signs and nursing note reviewed. Exam conducted with a chaperone present (Patient's primary RN and male Insurance claims handler).  Constitutional:      Appearance: He is well-developed.  HENT:     Head: Normocephalic and atraumatic.  Eyes:     Conjunctiva/sclera: Conjunctivae normal.  Neck:      Musculoskeletal: Neck supple.  Cardiovascular:     Rate and Rhythm: Normal rate and regular rhythm.     Pulses: Normal pulses.     Comments: AV fistula on left forearm with palpable thrill Pulmonary:     Effort: Pulmonary effort is normal. No respiratory distress.     Breath sounds: Rhonchi (Mild, diffuse bilateral bases) present.  Abdominal:     General: Abdomen is flat. There is no distension.     Palpations: Abdomen is soft.     Tenderness: There is no abdominal tenderness. There is no guarding.  Genitourinary:    Comments: Testicular exam was not performed due to patient preference.  During exam he stated "it hurts when I do this" and pulled on his Foley catheter causing it to be removed.  Balloon is not inflated.  No obvious penile lesions or sores. Musculoskeletal:     Right lower leg: No edema.     Left lower leg: No edema.  Skin:    General: Skin is warm and dry.  Neurological:     General: No focal deficit present.     Mental Status: He is alert.     Cranial Nerves: No cranial nerve deficit.  Psychiatric:        Mood and Affect: Mood normal.        Behavior: Behavior normal.      ED Treatments / Results  Labs (all labs ordered are listed, but only abnormal results are displayed) Labs Reviewed  COMPREHENSIVE METABOLIC PANEL - Abnormal; Notable for the following components:      Result Value   Chloride 95 (*)    CO2 21 (*)    Glucose, Bld 106 (*)    BUN 85 (*)    Creatinine, Ser 18.56 (*)    GFR calc non Af Amer 2 (*)    GFR calc Af Amer 3 (*)    Anion gap 20 (*)    All other components within normal limits  CBC WITH DIFFERENTIAL/PLATELET - Abnormal; Notable for the following components:   RBC 3.35 (*)    Hemoglobin 9.9 (*)    HCT 30.8 (*)    Platelets 140 (*)    All other components within normal limits  URINALYSIS, ROUTINE W REFLEX MICROSCOPIC - Abnormal; Notable for the following components:   Color, Urine STRAW (*)    APPearance CLOUDY (*)    Specific  Gravity, Urine 1.001 (*)    All other components within normal limits  URINALYSIS, ROUTINE W REFLEX MICROSCOPIC - Abnormal; Notable for the following components:   APPearance CLOUDY (*)    Glucose, UA 50 (*)    Hgb urine dipstick MODERATE (*)    Protein, ur >=300 (*)    Leukocytes,Ua LARGE (*)    WBC, UA >50 (*)    Bacteria, UA FEW (*)    Non Squamous Epithelial 0-5 (*)    All other components within normal limits  URINE CULTURE    EKG None  Radiology No results found.  Procedures Procedures (including critical care time)  Medications Ordered in ED Medications - No data to display   Initial Impression / Assessment and Plan / ED Course  I have reviewed the triage vital signs and the nursing notes.  Pertinent labs & imaging results that were available during my care of the patient were reviewed by me and considered in my medical decision making (see chart for details).  Clinical Course as of May 05 31  Fri May 04, 2019  2148 GU exam performed with patient's primary RN and male RN Technical sales engineer.  Testicles were not examined.  When I asked patient where he hurt he said "here" and pulled gently on his catheter at which point the Foley fell out completely.  The balloon does not appear to be intact.     [EH]    Clinical Course User Index [EH] Lorin Glass, PA-C      Patient presents today for evaluation of penile pain over 2 days.  Here he is afebrile, not tachycardic or tachypneic and generally well-appearing.  He is hypertensive however denies shortness of breath, chest pain, headache or other signs concerning for  hypertensive urgency/emergency.  Labs are obtained and reviewed, he is anemic with a hemoglobin of 9.9, he reports that this is managed by his nephrologist.  CMP has multiple significant electrolyte derangements including a creatinine of 18.56, BUN of 85 with a chloride of 95, GFR of 3 and an anion gap of 20.  Patient's potassium is not elevated.  He did  miss his last 2 dialysis sessions.    Foley catheter was replaced by RN and resulting urine sample had moderate blood, over 300 protein with microscopy showing over 50 white blood cells with few bacteria, white blood cell clumps and 21-50 red blood cells.  Given patient's pain and symptoms we will treat for urinary tract infection and urine culture is sent.  Previous cultures were reviewed.  Patient eloped from the emergency room before I could discuss his results with him.  He was contacted by the nurse and reportedly told them to send his prescriptions to his pharmacy as needed.    Will give modified dose of Keflex given his hemodialysis.  His review of old labs show that his creatinine appears to be roughly at his baseline.  I suspect that his anion gap may be related to missing dialysis, however patient left before we could discuss additional testing.  This patient was discussed with Dr. Eulis Foster.    Final Clinical Impressions(s) / ED Diagnoses   Final diagnoses:  Foley catheter problem, initial encounter [facility])  Urinary tract infection associated with indwelling urethral catheter, initial encounter [facility])    ED Discharge Orders         Ordered    cephALEXin (KEFLEX) 250 MG capsule  2 times daily,   Status:  Discontinued     05/04/19 2319    cephALEXin (KEFLEX) 250 MG capsule  2 times daily     05/04/19 2328           Lorin Glass, [place] 05/05/19 0036    Daleen Bo, MD 05/05/19 1032

## 2019-05-04 NOTE — ED Triage Notes (Signed)
Pt states that he has had a foley in since June. Pt states it has not been changed since then and it has become infected.

## 2019-05-04 NOTE — ED Notes (Signed)
Pt states he had his catheter placed in June 2020. Pt states catheter has not been changed since then. He states he started having pain with urination 2 days ago. He has been also experiencing urgency as well as polyuria. Pt denies hematuria, he denies flank pain, nausea or vomiting. Pt states his home thermometer read 106 temperature which caused him to come in. Pt does not currently have a fever, temperature 98.9 oral.

## 2019-05-04 NOTE — ED Notes (Signed)
Bladder scan x2: 1st read 42ml, 2nd read 43ml

## 2019-05-05 NOTE — ED Notes (Signed)
Pt called back stating his prescription was not at [facility], Probation officer spoke with Dr Ronnald Nian who gave instructions for Keflex 500 mg BID x 10 days with no refills to be called in, Order was called in to [facility] on [address] as requested by pt.

## 2019-05-05 NOTE — ED Notes (Signed)
Pt left before gathering paperwork. Called to ask pt to come collect prescription. Pt asked to have prescription sent to pharmacy. Provider informed.

## 2019-05-07 LAB — URINE CULTURE: Culture: 20000 — AB

## 2019-05-08 NOTE — Progress Notes (Signed)
ED Antimicrobial Stewardship Positive Culture Follow Up   Devin Bullock is an 53 y.o. male who presented to [facility] on 05/04/2019 with a chief complaint of  Pain with urination, increased frequency, polyuria.  Chief Complaint  Patient presents with  . Foley Issue    Recent Results (from the past 720 hour(s))  Urine culture     Status: Abnormal   Collection Time: 05/04/19 10:42 PM   Specimen: Urine, Random  Result Value Ref Range Status   Specimen Description   Final    URINE, RANDOM Performed at [facility] [address] [place], [place] [address]    Special Requests   Final    NONE Performed at [facility], [place] [address] [place], [place] [address]    Culture 20,000 COLONIES/mL [facility] MORGANII (A)  Final   Report Status 05/07/2019 FINAL  Final   Organism ID, Bacteria MORGANELLA MORGANII (A)  Final      Susceptibility   Morganella morganii - MIC*    AMPICILLIN >=32 RESISTANT Resistant     CEFAZOLIN >=64 RESISTANT Resistant     CEFTRIAXONE <=1 SENSITIVE Sensitive     CIPROFLOXACIN <=0.25 SENSITIVE Sensitive     GENTAMICIN <=1 SENSITIVE Sensitive     IMIPENEM 4 SENSITIVE Sensitive     NITROFURANTOIN 128 RESISTANT Resistant     TRIMETH/SULFA <=20 SENSITIVE Sensitive     AMPICILLIN/SULBACTAM >=32 RESISTANT Resistant     PIP/TAZO <=4 SENSITIVE Sensitive     * 20,000 COLONIES/mL MORGANELLA MORGANII    [x]  Treated with cephalexin, organism resistant to prescribed antimicrobial []  Patient discharged originally without antimicrobial agent and treatment is now indicated  Plan: 1) d/c cephalexin 2) start ciprofloxacin 500 mg PO daily for 7 days  ED Provider: Coral Ceo, PA-C   Lynelle Doctor 05/08/2019, 9:33 AM Clinical Pharmacist [phone]

## 2019-05-25 DIAGNOSIS — T782XXS Anaphylactic shock, unspecified, sequela: Secondary | ICD-10-CM | POA: Insufficient documentation

## 2020-03-16 DIAGNOSIS — Z9289 Personal history of other medical treatment: Secondary | ICD-10-CM

## 2020-03-16 HISTORY — DX: Personal history of other medical treatment: Z92.89

## 2020-04-02 ENCOUNTER — Other Ambulatory Visit: Payer: Self-pay

## 2020-04-02 ENCOUNTER — Emergency Department (HOSPITAL_COMMUNITY): Payer: Medicaid Other

## 2020-04-02 ENCOUNTER — Observation Stay (HOSPITAL_COMMUNITY)
Admission: EM | Admit: 2020-04-02 | Discharge: 2020-04-04 | Disposition: A | Discharge status: Home / Self Care | Payer: Medicaid Other | Attending: Family Medicine | Admitting: Family Medicine

## 2020-04-02 DIAGNOSIS — I509 Heart failure, unspecified: Secondary | ICD-10-CM | POA: Insufficient documentation

## 2020-04-02 DIAGNOSIS — I130 Hypertensive heart and chronic kidney disease with heart failure and stage 1 through stage 4 chronic kidney disease, or unspecified chronic kidney disease: Secondary | ICD-10-CM | POA: Diagnosis not present

## 2020-04-02 DIAGNOSIS — Z992 Dependence on renal dialysis: Secondary | ICD-10-CM

## 2020-04-02 DIAGNOSIS — Z79899 Other long term (current) drug therapy: Secondary | ICD-10-CM | POA: Insufficient documentation

## 2020-04-02 DIAGNOSIS — K922 Gastrointestinal hemorrhage, unspecified: Secondary | ICD-10-CM | POA: Diagnosis not present

## 2020-04-02 DIAGNOSIS — K2980 Duodenitis without bleeding: Secondary | ICD-10-CM | POA: Diagnosis present

## 2020-04-02 DIAGNOSIS — D649 Anemia, unspecified: Secondary | ICD-10-CM | POA: Diagnosis not present

## 2020-04-02 DIAGNOSIS — Z20822 Contact with and (suspected) exposure to covid-19: Secondary | ICD-10-CM | POA: Insufficient documentation

## 2020-04-02 DIAGNOSIS — K3189 Other diseases of stomach and duodenum: Secondary | ICD-10-CM | POA: Insufficient documentation

## 2020-04-02 DIAGNOSIS — N189 Chronic kidney disease, unspecified: Secondary | ICD-10-CM | POA: Diagnosis not present

## 2020-04-02 DIAGNOSIS — K920 Hematemesis: Principal | ICD-10-CM | POA: Insufficient documentation

## 2020-04-02 DIAGNOSIS — N186 End stage renal disease: Secondary | ICD-10-CM

## 2020-04-02 DIAGNOSIS — D62 Acute posthemorrhagic anemia: Secondary | ICD-10-CM | POA: Diagnosis present

## 2020-04-02 DIAGNOSIS — I10 Essential (primary) hypertension: Secondary | ICD-10-CM

## 2020-04-02 LAB — COMPREHENSIVE METABOLIC PANEL
ALT: 15 U/L (ref 0–44)
AST: 18 U/L (ref 15–41)
Albumin: 3.2 g/dL — ABNORMAL LOW (ref 3.5–5.0)
Alkaline Phosphatase: 40 U/L (ref 38–126)
Anion gap: 15 (ref 5–15)
BUN: 95 mg/dL — ABNORMAL HIGH (ref 6–20)
CO2: 28 mmol/L (ref 22–32)
Calcium: 9.1 mg/dL (ref 8.9–10.3)
Chloride: 97 mmol/L — ABNORMAL LOW (ref 98–111)
Creatinine, Ser: 13.27 mg/dL — ABNORMAL HIGH (ref 0.61–1.24)
GFR calc Af Amer: 4 mL/min — ABNORMAL LOW (ref >60)
GFR calc non Af Amer: 4 mL/min — ABNORMAL LOW (ref >60)
Glucose, Bld: 102 mg/dL — ABNORMAL HIGH (ref 70–99)
Potassium: 4 mmol/L (ref 3.5–5.1)
Sodium: 140 mmol/L (ref 135–145)
Total Bilirubin: 0.6 mg/dL (ref 0.3–1.2)
Total Protein: 5.5 g/dL — ABNORMAL LOW (ref 6.5–8.1)

## 2020-04-02 LAB — CBC
HCT: 13 % — ABNORMAL LOW (ref 39.0–52.0)
Hemoglobin: 4.2 g/dL — CL (ref 13.0–17.0)
MCH: 32.8 pg (ref 26.0–34.0)
MCHC: 32.3 g/dL (ref 30.0–36.0)
MCV: 101.6 fL — ABNORMAL HIGH (ref 80.0–100.0)
Platelets: 119 10*3/uL — ABNORMAL LOW (ref 150–400)
RBC: 1.28 MIL/uL — ABNORMAL LOW (ref 4.22–5.81)
RDW: 14.6 % (ref 11.5–15.5)
WBC: 5.4 10*3/uL (ref 4.0–10.5)
nRBC: 0 % (ref 0.0–0.2)

## 2020-04-02 LAB — PREPARE RBC (CROSSMATCH)

## 2020-04-02 LAB — SARS CORONAVIRUS 2 BY RT PCR (HOSPITAL ORDER, PERFORMED IN ~~LOC~~ HOSPITAL LAB): SARS Coronavirus 2: NEGATIVE

## 2020-04-02 LAB — POC OCCULT BLOOD, ED: Fecal Occult Bld: POSITIVE — AB

## 2020-04-02 LAB — APTT: aPTT: 39 seconds — ABNORMAL HIGH (ref 24–36)

## 2020-04-02 LAB — ABO/RH: ABO/RH(D): O POS

## 2020-04-02 LAB — PROTIME-INR
INR: 1.2 (ref 0.8–1.2)
Prothrombin Time: 14.7 seconds (ref 11.4–15.2)

## 2020-04-02 MED ORDER — CHLORHEXIDINE GLUCONATE CLOTH 2 % EX PADS: 6.0000 | MEDICATED_PAD | Freq: Every day | CUTANEOUS | Status: DC

## 2020-04-02 MED ORDER — SODIUM CHLORIDE 0.9 % IV SOLN
8.0000 mg/h | INTRAVENOUS | Status: DC
  Administered 2020-04-02 (×2): 8 mg/h via INTRAVENOUS
  Filled 2020-04-02 (×4): qty 80

## 2020-04-02 MED ORDER — ACETAMINOPHEN 650 MG RE SUPP: 650.0000 mg | Freq: Four times a day (QID) | RECTAL | Status: DC | PRN

## 2020-04-02 MED ORDER — ACETAMINOPHEN 325 MG PO TABS: 650.0000 mg | ORAL_TABLET | Freq: Four times a day (QID) | ORAL | Status: DC | PRN

## 2020-04-02 MED ORDER — SODIUM CHLORIDE 0.9 % IV SOLN
10.0000 mL/h | Freq: Once | INTRAVENOUS | Status: AC
  Administered 2020-04-02: 10 mL/h via INTRAVENOUS

## 2020-04-02 MED ORDER — SODIUM CHLORIDE 0.9 % IV SOLN
80.0000 mg | Freq: Once | INTRAVENOUS | Status: AC
  Administered 2020-04-02: 80 mg via INTRAVENOUS
  Filled 2020-04-02: qty 80

## 2020-04-02 MED ORDER — SODIUM CHLORIDE 0.9 % IV BOLUS
1000.0000 mL | Freq: Once | INTRAVENOUS | Status: AC
  Administered 2020-04-02: 1000 mL via INTRAVENOUS

## 2020-04-02 NOTE — ED Provider Notes (Signed)
[facility] Emergency Department Provider Note MRN:  [id number]  Arrival date & time: 04/02/20     Chief Complaint   Altered Mental Status and GI Bleeding   History of Present Illness   Devin Bullock is a 54 y.o. year-old male with a history of hypertension, ESRD presenting to the ED with chief complaint of altered mental status.  Combative and altered after only 1 h of dialysis today.  Required Versed with EMS.  Patient also endorsing black stool and vomiting coffee-ground emesis for the past 2 days.  Explains that he took his cinacalcet medication without a meal and this "tore up my stomach".  Currently denying any chest pain or abdominal pain, no nausea, did not have any chest pain during the episode of dizziness during dialysis.  Does not remember being combative and swinging at EMS staff.  Denies recent fever or illness, no cough, no burning with urination.  Review of Systems  A complete 10 system review of systems was obtained and all systems are negative except as noted in the HPI and PMH.   Patient's Health History    Past Medical History:  Diagnosis Date  . Chronic kidney disease   . Enlarged heart   . Hypertension   . Morbid obesity ([place])   . Thyroid disease     Past Surgical History:  Procedure Laterality Date  . AV FISTULA PLACEMENT Left 05/24/2014   Procedure: BRACHIOCEPHALIC ARTERIOVENOUS (AV) FISTULA CREATION;  Surgeon: Angelia Mould, MD;  Location: [place];  Service: Vascular;  Laterality: Left;  . None      Family History  Problem Relation Age of Onset  . Diabetes Mother   . Hypertension Mother   . Heart attack Mother 10  . Stroke Father   . Hypertension Father   . Depression Father   . Diabetes Brother   . Hypertension Brother   . Stroke Brother 48  . Alcohol abuse Brother   . Asthma Brother     Social History   Socioeconomic History  . Marital status: Single    Spouse name: Not on file  . Number of children: 2  .  Years of education: Not on file  . Highest education level: Not on file  Occupational History  . Not on file  Tobacco Use  . Smoking status: Never Smoker  . Smokeless tobacco: Never Used  Substance and Sexual Activity  . Alcohol use: No  . Drug use: No  . Sexual activity: Never  Other Topics Concern  . Not on file  Social History Narrative   Lives alone.    Social Determinants of Health   Financial Resource Strain:   . Difficulty of Paying Living Expenses:   Food Insecurity:   . Worried About Charity fundraiser in the Last Year:   . Arboriculturist in the Last Year:   Transportation Needs:   . Film/video editor (Medical):   Marland Kitchen Lack of Transportation (Non-Medical):   Physical Activity:   . Days of Exercise per Week:   . Minutes of Exercise per Session:   Stress:   . Feeling of Stress :   Social Connections:   . Frequency of Communication with Friends and Family:   . Frequency of Social Gatherings with Friends and Family:   . Attends Religious Services:   . Active Member of Clubs or Organizations:   . Attends Archivist Meetings:   Marland Kitchen Marital Status:   Intimate Partner Violence:   .  Fear of Current or Ex-Partner:   . Emotionally Abused:   Marland Kitchen Physically Abused:   . Sexually Abused:      Physical Exam   Vitals:   04/02/20 1138 04/02/20 1200  BP: (!) 151/89 (!) 158/89  Pulse: 94 96  Resp: (!) 31 (!) 21  Temp: 98.1 F (36.7 C)   SpO2: 100% 100%    CONSTITUTIONAL: Well-appearing, NAD NEURO:  Alert and oriented x 3, normal and symmetric strength and sensation, normal coordination, normal speech EYES:  eyes equal and reactive ENT/NECK:  no LAD, no JVD CARDIO: Regular rate, well-perfused, normal S1 and S2 PULM:  CTAB no wheezing or rhonchi GI/GU:  normal bowel sounds, non-distended, non-tender MSK/SPINE:  No gross deformities, no edema SKIN:  no rash, atraumatic PSYCH:  Appropriate speech and behavior  *Additional and/or pertinent findings  included in MDM below  Diagnostic and Interventional Summary    EKG Interpretation  Date/Time:  Wednesday April 02 2020 11:34:55 EDT Ventricular Rate:  94 PR Interval:    QRS Duration: 120 QT Interval:  374 QTC Calculation: 468 R Axis:   -28 Text Interpretation: Sinus rhythm LVH with IVCD and secondary repol abnrm Confirmed by Gerlene Fee [phone]) on 04/02/2020 1:31:37 PM      Labs Reviewed  CBC - Abnormal; Notable for the following components:      Result Value   RBC 1.28 (*)    Hemoglobin 4.2 (*)    HCT 13.0 (*)    MCV 101.6 (*)    Platelets 119 (*)    All other components within normal limits  COMPREHENSIVE METABOLIC PANEL - Abnormal; Notable for the following components:   Chloride 97 (*)    Glucose, Bld 102 (*)    BUN 95 (*)    Creatinine, Ser 13.27 (*)    Total Protein 5.5 (*)    Albumin 3.2 (*)    GFR calc non Af Amer 4 (*)    GFR calc Af Amer 4 (*)    All other components within normal limits  APTT - Abnormal; Notable for the following components:   aPTT 39 (*)    All other components within normal limits  POC OCCULT BLOOD, ED - Abnormal; Notable for the following components:   Fecal Occult Bld POSITIVE (*)    All other components within normal limits  SARS CORONAVIRUS 2 BY RT PCR (HOSPITAL ORDER, [place] [facility])  PROTIME-INR  OCCULT BLOOD X 1 CARD TO LAB, STOOL  TYPE AND SCREEN  PREPARE RBC (CROSSMATCH)  ABO/RH    DG Chest Port 1 View  Final Result      Medications  pantoprazole (PROTONIX) 80 mg in sodium chloride 0.9 % 100 mL (0.8 mg/mL) infusion (8 mg/hr Intravenous New Bag/Given 04/02/20 1239)  0.9 %  sodium chloride infusion (has no administration in time range)  sodium chloride 0.9 % bolus 1,000 mL (1,000 mLs Intravenous New Bag/Given 04/02/20 1236)  pantoprazole (PROTONIX) 80 mg in sodium chloride 0.9 % 100 mL IVPB (80 mg Intravenous New Bag/Given 04/02/20 1240)     Procedures  /  Critical Care .Critical Care Performed  by: Maudie Flakes, MD Authorized by: Maudie Flakes, MD   Critical care provider statement:    Critical care time (minutes):  45   Critical care was necessary to treat or prevent imminent or life-threatening deterioration of the following conditions: GI bleed, symptomatic anemia requiring blood transfusion.   Critical care was time spent personally by me on the  following activities:  Discussions with consultants, evaluation of patient's response to treatment, examination of patient, ordering and performing treatments and interventions, ordering and review of laboratory studies, ordering and review of radiographic studies, pulse oximetry, re-evaluation of patient's condition, obtaining history from patient or surrogate and review of old charts    ED Course and Medical Decision Making  I have reviewed the triage vital signs, the nursing notes, and pertinent available records from the EMR.  Listed above are laboratory and imaging tests that I personally ordered, reviewed, and interpreted and then considered in my medical decision making (see below for details).  Favoring disequilibrium in the setting of dialysis however given patient's report of black stool will also be evaluated for possible GI bleed.  Patient has grossly black stool on my rectal exam, awaiting Hemoccult testing. Clinical Course as of Apr 02 1330  Wed Apr 02, 2020  1218 Rectal exam reveals frankly melanotic stool which is Hemoccult positive.  Patient's hemoglobin is down to 4.2, no recent baseline but he did have a hemoglobin of about 9 last year.  Providing Protonix bolus and infusion, transfusing blood, [facility] is consulted and will evaluate the patient.  Will admit to medicine service.   [MB]    Clinical Course User Index [MB] Sedonia Small Barth Kirks, MD       Barth Kirks. Sedonia Small, [place] [email or web address]  Final Clinical Impressions(s) / ED Diagnoses     ICD-10-CM     1. Upper GI bleed  K92.2   2. Symptomatic anemia  D64.9     ED Discharge Orders    None       Discharge Instructions Discussed with and Provided to Patient:   Discharge Instructions   None       Maudie Flakes, MD 04/02/20 1332

## 2020-04-02 NOTE — ED Notes (Signed)
Brought pt hospital bed, switched pt from stretcher to hospital bed, brought pt blankets and pillow. Pt states he is comfortable and in NAD

## 2020-04-02 NOTE — Progress Notes (Signed)
I saw and examined Devin Bullock.  I discussed with the full resident team and we agreed on a plan.  I will cosign the H&PE when it is available.  Briefly, 54 yo male with ESRD on dialysis presents with lightheadedness with dialysis.  Patient reports a few days ago he had hematemesis and melena after taking his Cinacalcet on an empty stomach.  In ER found to have Hgb=4.  Issues: 1. Symptomatic anemia secondary to acute GI blood loss.  He has already received one unit and is hemodynamically stable in the ER. 2. Presumed UGI bleed, which has indeed been reported with Cinacalcet.  PPI, no blood thinners.  GI on board and plans EGD tomorrow.  No hx of PUD, ASA or NSAID or ETOH.   3. ESRD on dialysis.  Renal involved and aware of the blood transfusions (volume.)

## 2020-04-02 NOTE — H&P (View-Only) (Signed)
[facility] Consult  Referring Provider: Bero,Michael,MD/ER Primary Care Physician:  Mauricia Area, MD Primary Gastroenterologist: Althia Forts  Reason for Consultation: Symptomatic anemia, coffee-ground emesis, melena  HPI: Devin Bullock is a 54 y.o. male was brought from hemodialysis after he developed altered mental status. As per patient 2 days ago he took his medication Sensipar which caused epigastric pain followed by 1 episode of coffee-ground emesis and one episode of liquid black stool. On arrival to ED patient's hemoglobin was noted to be at 4.2, his baseline hemoglobin is around 9. Patient has never had an endoscopy or a colonoscopy in the past. Normally has bowel movements are regular and denies prior episodes of black stools or blood in stool. He denies unintentional weight loss, early satiety, difficulty swallowing, pain on swallowing, acid reflux or heartburn. There is no family history of colon cancer. Patient denies use of NSAIDs or aspirin and he is not on antiplatelets or anticoagulation at home.   Past Medical History:  Diagnosis Date  . Chronic kidney disease   . Enlarged heart   . Hypertension   . Morbid obesity ([place])   . Thyroid disease     Past Surgical History:  Procedure Laterality Date  . AV FISTULA PLACEMENT Left 05/24/2014   Procedure: BRACHIOCEPHALIC ARTERIOVENOUS (AV) FISTULA CREATION;  Surgeon: Angelia Mould, MD;  Location: [place];  Service: Vascular;  Laterality: Left;  . None      Prior to Admission medications   Medication Sig Start Date End Date Taking? Authorizing Provider  amLODipine (NORVASC) 10 MG tablet TAKE 1 TABLET BY MOUTH DAILY. Patient taking differently: Take 10 mg by mouth daily.  03/31/15  Yes Lance Bosch, NP  calcitRIOL (ROCALTROL) 0.25 MCG capsule Take 1 capsule (0.25 mcg total) by mouth daily. 06/13/14  Yes Lance Bosch, NP  furosemide (LASIX) 80 MG tablet Take 1 tablet (80 mg total) by mouth 2 (two)  times daily. Take 160 mg in the am & 80 mg in the pm 06/13/14  Yes Chari Manning A, NP  hydrALAZINE (APRESOLINE) 10 MG tablet Take 1 tablet (10 mg total) by mouth 3 (three) times daily. 01/10/17  Yes Pfeiffer, Jeannie Done, MD  ketoconazole (NIZORAL) 2 % cream Apply 1 fingertip amount to each foot daily. 02/09/18  Yes Evelina Bucy, DPM  labetalol (NORMODYNE) 300 MG tablet Take 1 tablet (300 mg total) by mouth 3 (three) times daily. 06/13/14  Yes Lance Bosch, NP  Multiple Vitamin (MULTIVITAMIN WITH MINERALS) TABS tablet Take 1 tablet by mouth daily.   Yes [provider]  multivitamin (RENA-VIT) TABS tablet Take 1 tablet by mouth daily. 02/01/19  Yes [provider]  oxyCODONE (ROXICODONE) 5 MG immediate release tablet Take 1 tablet (5 mg total) by mouth every 6 (six) hours as needed for severe pain. 05/24/14  Yes Rhyne, Samantha J, PA-C  RENAGEL 800 MG tablet Take 800-2,400 mg by mouth See admin instructions. Take 3 tablets with meals 3 times daily and 1 tablet twice daily with snacks. 02/01/18  Yes [provider]  sildenafil (VIAGRA) 25 MG tablet Take 1 tablet (25 mg total) by mouth daily as needed for erectile dysfunction. 08/06/14  Yes Lance Bosch, NP    Current Facility-Administered Medications  Medication Dose Route Frequency Provider Last Rate Last Admin  . 0.9 %  sodium chloride infusion  10 mL/hr Intravenous Once Maudie Flakes, MD      . pantoprazole (PROTONIX) 80 mg in sodium chloride 0.9 % 100 mL (  0.8 mg/mL) infusion  8 mg/hr Intravenous Continuous Maudie Flakes, MD 10 mL/hr at 04/02/20 1239 8 mg/hr at 04/02/20 1239   Current Outpatient Medications  Medication Sig Dispense Refill  . amLODipine (NORVASC) 10 MG tablet TAKE 1 TABLET BY MOUTH DAILY. (Patient taking differently: Take 10 mg by mouth daily. ) 30 tablet 0  . calcitRIOL (ROCALTROL) 0.25 MCG capsule Take 1 capsule (0.25 mcg total) by mouth daily. 30 capsule 3  . furosemide (LASIX) 80 MG tablet Take  1 tablet (80 mg total) by mouth 2 (two) times daily. Take 160 mg in the am & 80 mg in the pm 60 tablet 3  . hydrALAZINE (APRESOLINE) 10 MG tablet Take 1 tablet (10 mg total) by mouth 3 (three) times daily. 90 tablet 0  . ketoconazole (NIZORAL) 2 % cream Apply 1 fingertip amount to each foot daily. 30 g 0  . labetalol (NORMODYNE) 300 MG tablet Take 1 tablet (300 mg total) by mouth 3 (three) times daily. 90 tablet 3  . Multiple Vitamin (MULTIVITAMIN WITH MINERALS) TABS tablet Take 1 tablet by mouth daily.    . multivitamin (RENA-VIT) TABS tablet Take 1 tablet by mouth daily.    Marland Kitchen oxyCODONE (ROXICODONE) 5 MG immediate release tablet Take 1 tablet (5 mg total) by mouth every 6 (six) hours as needed for severe pain. 20 tablet 0  . RENAGEL 800 MG tablet Take 800-2,400 mg by mouth See admin instructions. Take 3 tablets with meals 3 times daily and 1 tablet twice daily with snacks.  5  . sildenafil (VIAGRA) 25 MG tablet Take 1 tablet (25 mg total) by mouth daily as needed for erectile dysfunction. 30 tablet 3    Allergies as of 04/02/2020  . (No Known Allergies)    Family History  Problem Relation Age of Onset  . Diabetes Mother   . Hypertension Mother   . Heart attack Mother 13  . Stroke Father   . Hypertension Father   . Depression Father   . Diabetes Brother   . Hypertension Brother   . Stroke Brother 48  . Alcohol abuse Brother   . Asthma Brother     Social History   Socioeconomic History  . Marital status: Single    Spouse name: Not on file  . Number of children: 2  . Years of education: Not on file  . Highest education level: Not on file  Occupational History  . Not on file  Tobacco Use  . Smoking status: Never Smoker  . Smokeless tobacco: Never Used  Substance and Sexual Activity  . Alcohol use: No  . Drug use: No  . Sexual activity: Never  Other Topics Concern  . Not on file  Social History Narrative   Lives alone.    Social Determinants of Health   Financial  Resource Strain:   . Difficulty of Paying Living Expenses:   Food Insecurity:   . Worried About Charity fundraiser in the Last Year:   . Arboriculturist in the Last Year:   Transportation Needs:   . Film/video editor (Medical):   Marland Kitchen Lack of Transportation (Non-Medical):   Physical Activity:   . Days of Exercise per Week:   . Minutes of Exercise per Session:   Stress:   . Feeling of Stress :   Social Connections:   . Frequency of Communication with Friends and Family:   . Frequency of Social Gatherings with Friends and Family:   . Attends Religious  Services:   . Active Member of Clubs or Organizations:   . Attends Archivist Meetings:   Marland Kitchen Marital Status:   Intimate Partner Violence:   . Fear of Current or Ex-Partner:   . Emotionally Abused:   Marland Kitchen Physically Abused:   . Sexually Abused:     Review of Systems:  GI: Described in detail in HPI.    Gen: Denies any fever, chills, rigors, night sweats, anorexia, fatigue, weakness, malaise, involuntary weight loss, and sleep disorder CV: Denies chest pain, angina, palpitations, syncope, orthopnea, PND, peripheral edema, and claudication. Resp: Denies dyspnea, cough, sputum, wheezing, coughing up blood. GU : Denies urinary burning, blood in urine, urinary frequency, urinary hesitancy, nocturnal urination, and urinary incontinence. MS: Denies joint pain or swelling.  Denies muscle weakness, cramps, atrophy.  Derm: Denies rash, itching, oral ulcerations, hives, unhealing ulcers.  Psych: Denies depression, anxiety, memory loss, suicidal ideation, hallucinations,  and confusion. Heme: Denies bruising and enlarged lymph nodes. Neuro:  Denies any headaches, dizziness, paresthesias. Endo:  Denies any problems with DM, thyroid, adrenal function.  Physical Exam: Vital signs in last 24 hours: Temp:  [98.1 F (36.7 C)] 98.1 F (36.7 C) (08/18 1138) Pulse Rate:  [94-96] 96 (08/18 1200) Resp:  [21-31] 21 (08/18 1200) BP:  (151-158)/(89) 158/89 (08/18 1200) SpO2:  [100 %] 100 % (08/18 1200)    General:   Alert,  Well-developed, obese, pleasant and cooperative in NAD Head:  Normocephalic and atraumatic. Eyes:  Sclera clear, no icterus.  Prominent pallor Ears:  Normal auditory acuity. Nose:  No deformity, discharge,  or lesions. Mouth:  No deformity or lesions.  Oropharynx pink & moist. Neck:  Supple; no masses or thyromegaly. Lungs:  Clear throughout to auscultation.   No wheezes, crackles, or rhonchi. No acute distress. Heart:  Regular rate and rhythm; no murmurs, clicks, rubs,  or gallops. Extremities: Left arm AV fistula Neurologic:  Alert and  oriented x4;  grossly normal neurologically. Skin:  Intact without significant lesions or rashes. Psych:  Alert and cooperative. Normal mood and affect. Abdomen:  Soft, nontender and nondistended. No masses, hepatosplenomegaly or hernias noted. Normal bowel sounds, without guarding, and without rebound.         Lab Results: Recent Labs    04/02/20 1037  WBC 5.4  HGB 4.2*  HCT 13.0*  PLT 119*   BMET Recent Labs    04/02/20 1037  NA 140  K 4.0  CL 97*  CO2 28  GLUCOSE 102*  BUN 95*  CREATININE 13.27*  CALCIUM 9.1   LFT Recent Labs    04/02/20 1037  PROT 5.5*  ALBUMIN 3.2*  AST 18  ALT 15  ALKPHOS 40  BILITOT 0.6   PT/INR Recent Labs    04/02/20 1149  LABPROT 14.7  INR 1.2    Studies/Results: DG Chest Port 1 View  Result Date: 04/02/2020 CLINICAL DATA:  Altered mental status EXAM: PORTABLE CHEST 1 VIEW COMPARISON:  May 09, 2017 FINDINGS: There is no edema or airspace opacity. There is cardiomegaly with pulmonary vascularity normal. No adenopathy. There is rightward deviation of the upper thoracic trachea. Foci of calcification noted in the carotid artery regions. IMPRESSION: Generalized cardiac enlargement. A degree of underlying pericardial effusion is questioned. Lungs clear. No adenopathy. Apparent carotid artery  calcification bilaterally. Deviation of the thoracic trachea toward the right may be indicative of enlarged thyroid. Electronically Signed   By: Lowella Grip III M.D.   On: 04/02/2020 11:19    Impression:  Symptomatic anemia, coffee-ground emesis, melena with a hemoglobin of 4.2 on admission suspicious of upper GI bleeding Macrocytosis, MCV 101.6 End-stage renal disease on hemodialysis with BUN of 95 and creatinine of 13.27 Hemodynamically stable    Plan: Clear liquid diet today, n.p.o. post midnight for EGD planned in a.m.Marland Kitchen Continue IV Protonix. 3 units of PRBC transfusion ordered. The risks and the benefits of the procedure were discussed with the patient in details. He understands and verbalizes consent.   LOS: 0 days   Ronnette Juniper, MD  04/02/2020, 1:18 PM

## 2020-04-02 NOTE — ED Notes (Signed)
Got patient into a gown did ekg shown to Dr Sedonia Small

## 2020-04-02 NOTE — Consult Note (Signed)
[facility] Renal Consultation Note    Indication for Consultation:  Management of ESRD/hemodialysis, anemia, hypertension/volume, and secondary hyperparathyroidism. PCP:  HPI: Devin Bullock is a 54 y.o. male with ESRD, HTN, hypothyroidism, and obesity who is being admitted with symptomatic anemia + GIB.  Was sent to ED via EMS from his HD unit today after noted to be altered and combative. He reports that he had several black stools and hematemesis on Monday after taking his sensipar tablet on an empty stomach.  In ED, vitals were normal range. He was afebrile and not hypoxic. Labs showed Na 140, K 4, CO2 28, Ca 9.1, WBC 5.4, and Hgb 4.2 with melena in rectal vault (guaiac +). He is being transfused 3U PRBCs currently. GI consulted with plan for EGD in AM. He denies CP, dyspnea, HA, abdominal pain, edema, fever or chills.  Dialyzes on MWF schedule at [facility]. As above, he had 1hr of dialysis today prior to ED evaluation. Per his records, he was altered and combative on Monday as well - got only partial HD that day too. Was recommended to go to ED at that time, but he refused.  Hgb trend from HD unit: Hgb 11.3 on 7/21, 8.2 on 7/28, 9.1 on 8/4, 9.2 on 8/11 - he did get a dose of Aranesp and IV iron at HD this morning.  Past Medical History:  Diagnosis Date  . Chronic kidney disease   . Enlarged heart   . Hypertension   . Morbid obesity ([place])   . Thyroid disease    Past Surgical History:  Procedure Laterality Date  . AV FISTULA PLACEMENT Left 05/24/2014   Procedure: BRACHIOCEPHALIC ARTERIOVENOUS (AV) FISTULA CREATION;  Surgeon: Angelia Mould, MD;  Location: [place];  Service: Vascular;  Laterality: Left;  . None     Family History  Problem Relation Age of Onset  . Diabetes Mother   . Hypertension Mother   . Heart attack Mother 39  . Stroke Father   . Hypertension Father   . Depression Father   . Diabetes Brother   . Hypertension Brother   .  Stroke Brother 48  . Alcohol abuse Brother   . Asthma Brother    Social History:  reports that he has never smoked. He has never used smokeless tobacco. He reports that he does not drink alcohol and does not use drugs.  ROS: As per HPI otherwise negative.  Physical Exam: Vitals:   04/02/20 1359 04/02/20 1545 04/02/20 1600 04/02/20 1613  BP: (!) 154/94 (!) 152/91 (!) 162/95 (!) 162/95  Pulse: 95 93 93 91  Resp: 18   (!) 27  Temp: 97.9 F (36.6 C)   97.8 F (36.6 C)  TempSrc: Temporal   Temporal  SpO2: 99% 100% 100% 99%     General: Well developed, well nourished, in no acute distress. Baseline MS at this time. Head: Normocephalic, atraumatic, sclera non-icteric, mucus membranes are moist. Neck: Supple without lymphadenopathy/masses. JVD not elevated. Lungs: Clear bilaterally to auscultation without wheezes, rales, or rhonchi.  Heart: RRR with normal S1, S2. No murmurs, rubs, or gallops appreciated. Abdomen: Soft, non-tender, non-distended with normoactive bowel sounds.  Lower extremities: No edema or ischemic changes, no open wounds. Neuro: Alert and oriented X 3. Moves all extremities spontaneously. Psych:  Responds to questions appropriately with a normal affect. Dialysis Access: L forearm AVF + bruit  No Known Allergies Prior to Admission medications   Medication Sig Start Date End Date Taking? Authorizing  Provider  amLODipine (NORVASC) 10 MG tablet TAKE 1 TABLET BY MOUTH DAILY. Patient taking differently: Take 10 mg by mouth daily.  03/31/15  Yes Lance Bosch, NP  calcitRIOL (ROCALTROL) 0.25 MCG capsule Take 1 capsule (0.25 mcg total) by mouth daily. 06/13/14  Yes Lance Bosch, NP  furosemide (LASIX) 80 MG tablet Take 1 tablet (80 mg total) by mouth 2 (two) times daily. Take 160 mg in the am & 80 mg in the pm 06/13/14  Yes Chari Manning A, NP  hydrALAZINE (APRESOLINE) 10 MG tablet Take 1 tablet (10 mg total) by mouth 3 (three) times daily. 01/10/17  Yes Charlesetta Shanks,  MD  labetalol (NORMODYNE) 300 MG tablet Take 1 tablet (300 mg total) by mouth 3 (three) times daily. 06/13/14  Yes Lance Bosch, NP  Multiple Vitamin (MULTIVITAMIN WITH MINERALS) TABS tablet Take 1 tablet by mouth daily.   Yes [provider]  multivitamin (RENA-VIT) TABS tablet Take 1 tablet by mouth daily. 02/01/19  Yes [provider]  RENAGEL 800 MG tablet Take 800-2,400 mg by mouth See admin instructions. Take 3 tablets with meals 3 times daily and 1 tablet twice daily with snacks. 02/01/18  Yes [provider]   Current Facility-Administered Medications  Medication Dose Route Frequency Provider Last Rate Last Admin  . acetaminophen (TYLENOL) tablet 650 mg  650 mg Oral Q6H PRN Lilland, Alana, DO       Or  . acetaminophen (TYLENOL) suppository 650 mg  650 mg Rectal Q6H PRN Lilland, Alana, DO      . pantoprazole (PROTONIX) 80 mg in sodium chloride 0.9 % 100 mL (0.8 mg/mL) infusion  8 mg/hr Intravenous Continuous Lilland, Alana, DO 10 mL/hr at 04/02/20 1239 8 mg/hr at 04/02/20 1239   Current Outpatient Medications  Medication Sig Dispense Refill  . amLODipine (NORVASC) 10 MG tablet TAKE 1 TABLET BY MOUTH DAILY. (Patient taking differently: Take 10 mg by mouth daily. ) 30 tablet 0  . calcitRIOL (ROCALTROL) 0.25 MCG capsule Take 1 capsule (0.25 mcg total) by mouth daily. 30 capsule 3  . furosemide (LASIX) 80 MG tablet Take 1 tablet (80 mg total) by mouth 2 (two) times daily. Take 160 mg in the am & 80 mg in the pm 60 tablet 3  . hydrALAZINE (APRESOLINE) 10 MG tablet Take 1 tablet (10 mg total) by mouth 3 (three) times daily. 90 tablet 0  . labetalol (NORMODYNE) 300 MG tablet Take 1 tablet (300 mg total) by mouth 3 (three) times daily. 90 tablet 3  . Multiple Vitamin (MULTIVITAMIN WITH MINERALS) TABS tablet Take 1 tablet by mouth daily.    . multivitamin (RENA-VIT) TABS tablet Take 1 tablet by mouth daily.    Marland Kitchen RENAGEL 800 MG tablet Take 800-2,400 mg by mouth See  admin instructions. Take 3 tablets with meals 3 times daily and 1 tablet twice daily with snacks.  5   Labs: Basic Metabolic Panel: Recent Labs  Lab 04/02/20 1037  NA 140  K 4.0  CL 97*  CO2 28  GLUCOSE 102*  BUN 95*  CREATININE 13.27*  CALCIUM 9.1   Liver Function Tests: Recent Labs  Lab 04/02/20 1037  AST 18  ALT 15  ALKPHOS 40  BILITOT 0.6  PROT 5.5*  ALBUMIN 3.2*   CBC: Recent Labs  Lab 04/02/20 1037  WBC 5.4  HGB 4.2*  HCT 13.0*  MCV 101.6*  PLT 119*   Studies/Results: DG Chest Port 1 View  Result Date: 04/02/2020 CLINICAL  DATA:  Altered mental status EXAM: PORTABLE CHEST 1 VIEW COMPARISON:  May 09, 2017 FINDINGS: There is no edema or airspace opacity. There is cardiomegaly with pulmonary vascularity normal. No adenopathy. There is rightward deviation of the upper thoracic trachea. Foci of calcification noted in the carotid artery regions. IMPRESSION: Generalized cardiac enlargement. A degree of underlying pericardial effusion is questioned. Lungs clear. No adenopathy. Apparent carotid artery calcification bilaterally. Deviation of the thoracic trachea toward the right may be indicative of enlarged thyroid. Electronically Signed   By: Lowella Grip III M.D.   On: 04/02/2020 11:19   Dialysis Orders:  MWF at [facility] 4:30hr, 450/800, EDW 133.5kg, 2K/2Ca, UFP #4, AVF, heparin 8000 bolus - Aranesp 72mcg IV weekly - given 8/18 - Hectoral 82mcg IV q HD - Venofer 100 x 5 ordered (s/p 1 dose so far, tsat 24% 8/11)  Assessment/Plan: 1. Symptomatic anemia/melena/UGIB: GI consulted, for EGD in AM. Getting 3U PRBCs now. Aranesp + IV iron given today at his HD unit. 2.  ESRD: Partial HD on Mon + today. Does not appear overloaded at this time, but after the blood products he may run into problems. Will tentatively plan to dialyze tomorrow after his endoscopy. 3.  Hypertension/volume: BP slightly high - HD tomorrow. 4.  Metabolic bone disease: Ca ok, Phos pending.  Resume binders after allowed to eat. Holding sensipar for now as ?may have played small role in his GIB, unclear as usually does not. 5.  T2DM 6. Hypothyroidism 7. AMS: At HD this morning, presumed d/t #1. Back to baseline at this time.  Veneta Penton, PA-C 04/02/2020, 4:22 PM  [facility]

## 2020-04-02 NOTE — H&P (Addendum)
[facility] Admission History and Physical Service Pager: [phone]  Patient name: Devin Bullock Medical record number: [id number] Date of birth: Nov 11, 1965 Age: 54 y.o. Gender: male  Primary Care Provider: Mauricia Area, MD Consultants: GI, Nephrology  Code Status: Full  Preferred Emergency Contact: Laural Roes, [phone] (Friend)  Chief Complaint: Altered Mental Status  Assessment and Plan: Devin Bullock is a 54 y.o. male presenting with altered mental status and anemia in the setting of GI bleed. PMH is significant for hypertension, obesity, cardiomegaly, ESRD on HD MWF,HFpEF and anemia.  Acute on chronic anemia in setting of suspected Severe Upper GI bleed Hemoglobin on admission found to be 4.2 with macrocytosis, baseline Hgb is around 10. Patient reported an episode of "dark coffee" emesis and melena on 8/16 after taking cinacalcet on an empty stomach. On the day of admission, patient went to hemodialysis and became combative after an hour and was transferred to the hospital via EMS, pt did not remember this episode and reports only remembering that he felt "loopy". Patient's renal medications and supplements do have listed adverse reactions of GI ulcers and since patient took medication differently by not eating before hand (as usual), this may be an adverse reaction to his medications. Patient denies taking NSAIDS, ASA or aspirin. Also denies alcohol consumption or recreational drug use including cocaine. FOBT +. PT and INR within normal limits. Patient was neither tachycardic nor tachypneic on admission, blood pressure was hypertensive with systolic BP in 893Y. Patient alert and oriented, with calm demeanor. No oxygen requirement and without abdominal pain, chest pain, SOB or sensation of palpitations. Patient denies any prior episodes of this bleeding and denies any rectal pain on admission. Patient requires admission for severe, symptomatic anemia in  setting of HD. - Admit to [place], attending Dr. Andria Frames -GI following, appreciate recommendations -Clear liquid diet today, NPO past midnight for EGD planned in a.m. -3U of PRBC transfusion in process -f/u post transfusion Hemoglobin  -continue IV Protonix  -Morning CBC and CMP - HIV -Vitals per floor protocol   ESRD (on HD MWF) Patient has a history of  ESRD with outpatient hemodialysis on M/W/F. Admission labs: BUN 95, Cr 13.27. Hemodynamically stable. Home meds include: Renagel and cinacalcet. Patient had an incomplete session of hemodialysis today of ~1 hour prior to onset of episode. -Nephrology consulted, continue HD per outpatient schedule, appreciate recs  -Morning CMP -will start renal diet once patient completes EGD   HTN  On admission BP was elevated at 154/94. Patient home med includes amlodipine 10 but has not been taking in the last two months due to reportedly "low blood pressures". Patient reports measurements of systolic blood pressures in the 120s on days he has HD and so he stopped taking BP medications. He reports typical blood pressures in the 130-140s/70-80s at home without medications. Patient had prior admission with hypertensive emergency and prescribed hydralazine, imdur, labetalol and amlodipine as listed above. Patient's elevated BP is thought to contribute to his progression of renal disease. Patient denies taking any BP medication currently but reports previously taking amlodipine.  -Vitals per routine -plan to restart amlodipine follow GI procedures    HFmrEF  Last echocardiogram in 2014 showed EF of 45-50% severely dilated LV cavity, severe LVH, EF of 45% to 50% and no regional wall motion abnormalities. There was fusion of early and atrial contributions to ventricular filling. LA was severely dilated, RA was moderately dilated, and a moderate, free-flowing pericardial effusion was identified circumferential to  the heart, without evidence of hemodynamic  compromise. Patient home medications list hydralazine, amlodipine and labetalol. Patient had prior admission with hypertensive emergency prior to ESRD status.  -will not obtain echo at this time given severity of anemia  -monitor I/Os   FEN/GI: clear liquids followed by  NPO at [facility]  Prophylaxis: SCDs in setting of acute bleeding   Disposition: med surg   History of Present Illness:  Devin Bullock is a 54 y.o. male presenting with blood in stool and emesis in setting of lightheadedness.   Patient reports that on 8/16 he took his cinacalcet without any food and has immense GI upset.  He states that soon after his medication, he had an episode of vomiting dark fluid and had a dark colored bowel movement. He states that he then became "loopy" and "passed out" in front of the hotel receptionist but his symptoms improved with drinking water. He then slept most of the next day and noted he was more tired than usual. When he went to dialysis today (8/18) he reported feeling "loopy" again and that they called an ambulance as the patient was reportedly combative and altered. Patient does not remember that he was combative and swinging at EMS staff after 1h of dialysis today. He believes that all of this has been caused by him taking the cinacalcet without any food, as he previously has taken it correctly with food and not had a problem and the episode happened right after. Currently denies chest pain, abdominal pain, N/V, dizziness, palpitations, weakness, vision changes, HA.    Patient reports that he is concerned about admission because he needs to pick up his daughter in [place] at 6:30pm. He is also currently "between places" and is living in a hotel at the moment. Patient lives independently and completes ADLs independently. He denies current tobacco use stated in smoked cigarettes >20 years ago and denies alcohol and recreational drug use.   Review Of Systems: Per HPI with the following additions:    Review of Systems  Eyes: Negative for visual disturbance.  Respiratory: Negative for shortness of breath.   Cardiovascular: Negative for chest pain and palpitations.  Gastrointestinal: Positive for blood in stool and vomiting. Negative for abdominal pain, constipation and diarrhea.  Neurological: Positive for light-headedness. Negative for dizziness and weakness.     Patient Active Problem List   Diagnosis Date Noted  . Symptomatic anemia 04/02/2020  . CKD (chronic kidney disease) 07/03/2014  . End stage renal disease ([place]) 08/08/2013  . Chronic kidney disease (CKD), stage IV (severe) ([place]) 04/17/2013  . Patient nonadherence 04/17/2013  . Morbid obesity ([place]) 04/15/2013  . Anemia 04/15/2013  . Cardiomegaly 04/13/2013  . Hypertensive emergency 04/13/2013  . Acute on chronic combined systolic and diastolic CHF (congestive heart failure) ([place]) 04/13/2013    Past Medical History: Past Medical History:  Diagnosis Date  . Chronic kidney disease   . Enlarged heart   . Hypertension   . Morbid obesity ([place])   . Thyroid disease     Past Surgical History: Past Surgical History:  Procedure Laterality Date  . AV FISTULA PLACEMENT Left 05/24/2014   Procedure: BRACHIOCEPHALIC ARTERIOVENOUS (AV) FISTULA CREATION;  Surgeon: Angelia Mould, MD;  Location: [facility];  Service: Vascular;  Laterality: Left;  . None      Social History: Social History   Tobacco Use  . Smoking status: Never Smoker  . Smokeless tobacco: Never Used  Substance Use Topics  . Alcohol use: No  .  Drug use: No   Additional social history:   Please also refer to relevant sections of EMR.  Family History: Family History  Problem Relation Age of Onset  . Diabetes Mother   . Hypertension Mother   . Heart attack Mother 66  . Stroke Father   . Hypertension Father   . Depression Father   . Diabetes Brother   . Hypertension Brother   . Stroke Brother 48  . Alcohol abuse Brother   . Asthma Brother      Allergies and Medications: No Known Allergies No current facility-administered medications on file prior to encounter.   Current Outpatient Medications on File Prior to Encounter  Medication Sig Dispense Refill  . amLODipine (NORVASC) 10 MG tablet TAKE 1 TABLET BY MOUTH DAILY. (Patient taking differently: Take 10 mg by mouth daily. ) 30 tablet 0  . calcitRIOL (ROCALTROL) 0.25 MCG capsule Take 1 capsule (0.25 mcg total) by mouth daily. 30 capsule 3  . furosemide (LASIX) 80 MG tablet Take 1 tablet (80 mg total) by mouth 2 (two) times daily. Take 160 mg in the am & 80 mg in the pm 60 tablet 3  . hydrALAZINE (APRESOLINE) 10 MG tablet Take 1 tablet (10 mg total) by mouth 3 (three) times daily. 90 tablet 0  . labetalol (NORMODYNE) 300 MG tablet Take 1 tablet (300 mg total) by mouth 3 (three) times daily. 90 tablet 3  . Multiple Vitamin (MULTIVITAMIN WITH MINERALS) TABS tablet Take 1 tablet by mouth daily.    . multivitamin (RENA-VIT) TABS tablet Take 1 tablet by mouth daily.    Marland Kitchen RENAGEL 800 MG tablet Take 800-2,400 mg by mouth See admin instructions. Take 3 tablets with meals 3 times daily and 1 tablet twice daily with snacks.  5    Objective: BP (!) 162/95   Pulse 93   Temp 97.9 F (36.6 C) (Temporal)   Resp 18   SpO2 100%   Exam: General: male appearing in NAD, sitting upright on stretcher, alert  Eyes: conjunctival pallor, no scleral icterus, EOMI intact  Cardiovascular: RRR, systolic murmur appreciated, no r/g Respiratory: CTAB, no wheezes/rales/rhonchi, normal respiratory effort on RA  Gastrointestinal: soft, non-tender, non-distended, bowel sounds appreciated  MSK: normal movement of all extremities Neuro: A&O to person and place Psych: demonstrates some insight into his illness but unaware of severity of his current situation, appropriate speech and behavior  Labs and Imaging: CBC BMET  Recent Labs  Lab 04/02/20 1037  WBC 5.4  HGB 4.2*  HCT 13.0*  PLT 119*   Recent  Labs  Lab 04/02/20 1037  NA 140  K 4.0  CL 97*  CO2 28  BUN 95*  CREATININE 13.27*  GLUCOSE 102*  CALCIUM 9.1     EKG: Normal sinus rhythm. T wave inversion in leads I and aVL, unchanged EKG from 5 years prior.   DG Chest Port 1 View  Result Date: 04/02/2020 CLINICAL DATA:  Altered mental status EXAM: PORTABLE CHEST 1 VIEW COMPARISON:  May 09, 2017 FINDINGS: There is no edema or airspace opacity. There is cardiomegaly with pulmonary vascularity normal. No adenopathy. There is rightward deviation of the upper thoracic trachea. Foci of calcification noted in the carotid artery regions. IMPRESSION: Generalized cardiac enlargement. A degree of underlying pericardial effusion is questioned. Lungs clear. No adenopathy. Apparent carotid artery calcification bilaterally. Deviation of the thoracic trachea toward the right may be indicative of enlarged thyroid. Electronically Signed   By: Lowella Grip III M.D.  On: 04/02/2020 11:19     Rise Patience, DO  04/02/2020, 4:08 PM PGY-1, [place] Intern pager: [phone], text pages welcome   FPTS Upper-Level Resident Addendum   I have independently interviewed and examined the patient. I have discussed the above with the original author and agree with their documentation. My edits for correction/addition/clarification are in blue. Please see also any attending notes.   Eulis Foster, MD PGY-2, [facility] 04/02/2020 9:46 PM  [facility] pager: [phone] (text pages welcome through [place])

## 2020-04-02 NOTE — Consult Note (Signed)
[facility] Consult  Referring Provider: Bero,Michael,MD/ER Primary Care Physician:  Mauricia Area, MD Primary Gastroenterologist: Althia Forts  Reason for Consultation: Symptomatic anemia, coffee-ground emesis, melena  HPI: Devin Bullock is a 54 y.o. male was brought from hemodialysis after he developed altered mental status. As per patient 2 days ago he took his medication Sensipar which caused epigastric pain followed by 1 episode of coffee-ground emesis and one episode of liquid black stool. On arrival to ED patient's hemoglobin was noted to be at 4.2, his baseline hemoglobin is around 9. Patient has never had an endoscopy or a colonoscopy in the past. Normally has bowel movements are regular and denies prior episodes of black stools or blood in stool. He denies unintentional weight loss, early satiety, difficulty swallowing, pain on swallowing, acid reflux or heartburn. There is no family history of colon cancer. Patient denies use of NSAIDs or aspirin and he is not on antiplatelets or anticoagulation at home.   Past Medical History:  Diagnosis Date  . Chronic kidney disease   . Enlarged heart   . Hypertension   . Morbid obesity ([place])   . Thyroid disease     Past Surgical History:  Procedure Laterality Date  . AV FISTULA PLACEMENT Left 05/24/2014   Procedure: BRACHIOCEPHALIC ARTERIOVENOUS (AV) FISTULA CREATION;  Surgeon: Angelia Mould, MD;  Location: [facility];  Service: Vascular;  Laterality: Left;  . None      Prior to Admission medications   Medication Sig Start Date End Date Taking? Authorizing Provider  amLODipine (NORVASC) 10 MG tablet TAKE 1 TABLET BY MOUTH DAILY. Patient taking differently: Take 10 mg by mouth daily.  03/31/15  Yes Lance Bosch, NP  calcitRIOL (ROCALTROL) 0.25 MCG capsule Take 1 capsule (0.25 mcg total) by mouth daily. 06/13/14  Yes Lance Bosch, NP  furosemide (LASIX) 80 MG tablet Take 1 tablet (80 mg total) by mouth 2 (two)  times daily. Take 160 mg in the am & 80 mg in the pm 06/13/14  Yes Chari Manning A, NP  hydrALAZINE (APRESOLINE) 10 MG tablet Take 1 tablet (10 mg total) by mouth 3 (three) times daily. 01/10/17  Yes Pfeiffer, Jeannie Done, MD  ketoconazole (NIZORAL) 2 % cream Apply 1 fingertip amount to each foot daily. 02/09/18  Yes Evelina Bucy, DPM  labetalol (NORMODYNE) 300 MG tablet Take 1 tablet (300 mg total) by mouth 3 (three) times daily. 06/13/14  Yes Lance Bosch, NP  Multiple Vitamin (MULTIVITAMIN WITH MINERALS) TABS tablet Take 1 tablet by mouth daily.   Yes [provider]  multivitamin (RENA-VIT) TABS tablet Take 1 tablet by mouth daily. 02/01/19  Yes [provider]  oxyCODONE (ROXICODONE) 5 MG immediate release tablet Take 1 tablet (5 mg total) by mouth every 6 (six) hours as needed for severe pain. 05/24/14  Yes Rhyne, Samantha J, PA-C  RENAGEL 800 MG tablet Take 800-2,400 mg by mouth See admin instructions. Take 3 tablets with meals 3 times daily and 1 tablet twice daily with snacks. 02/01/18  Yes [provider]  sildenafil (VIAGRA) 25 MG tablet Take 1 tablet (25 mg total) by mouth daily as needed for erectile dysfunction. 08/06/14  Yes Lance Bosch, NP    Current Facility-Administered Medications  Medication Dose Route Frequency Provider Last Rate Last Admin  . 0.9 %  sodium chloride infusion  10 mL/hr Intravenous Once Maudie Flakes, MD      . pantoprazole (PROTONIX) 80 mg in sodium chloride 0.9 % 100 mL (  0.8 mg/mL) infusion  8 mg/hr Intravenous Continuous Maudie Flakes, MD 10 mL/hr at 04/02/20 1239 8 mg/hr at 04/02/20 1239   Current Outpatient Medications  Medication Sig Dispense Refill  . amLODipine (NORVASC) 10 MG tablet TAKE 1 TABLET BY MOUTH DAILY. (Patient taking differently: Take 10 mg by mouth daily. ) 30 tablet 0  . calcitRIOL (ROCALTROL) 0.25 MCG capsule Take 1 capsule (0.25 mcg total) by mouth daily. 30 capsule 3  . furosemide (LASIX) 80 MG tablet Take  1 tablet (80 mg total) by mouth 2 (two) times daily. Take 160 mg in the am & 80 mg in the pm 60 tablet 3  . hydrALAZINE (APRESOLINE) 10 MG tablet Take 1 tablet (10 mg total) by mouth 3 (three) times daily. 90 tablet 0  . ketoconazole (NIZORAL) 2 % cream Apply 1 fingertip amount to each foot daily. 30 g 0  . labetalol (NORMODYNE) 300 MG tablet Take 1 tablet (300 mg total) by mouth 3 (three) times daily. 90 tablet 3  . Multiple Vitamin (MULTIVITAMIN WITH MINERALS) TABS tablet Take 1 tablet by mouth daily.    . multivitamin (RENA-VIT) TABS tablet Take 1 tablet by mouth daily.    Marland Kitchen oxyCODONE (ROXICODONE) 5 MG immediate release tablet Take 1 tablet (5 mg total) by mouth every 6 (six) hours as needed for severe pain. 20 tablet 0  . RENAGEL 800 MG tablet Take 800-2,400 mg by mouth See admin instructions. Take 3 tablets with meals 3 times daily and 1 tablet twice daily with snacks.  5  . sildenafil (VIAGRA) 25 MG tablet Take 1 tablet (25 mg total) by mouth daily as needed for erectile dysfunction. 30 tablet 3    Allergies as of 04/02/2020  . (No Known Allergies)    Family History  Problem Relation Age of Onset  . Diabetes Mother   . Hypertension Mother   . Heart attack Mother 73  . Stroke Father   . Hypertension Father   . Depression Father   . Diabetes Brother   . Hypertension Brother   . Stroke Brother 48  . Alcohol abuse Brother   . Asthma Brother     Social History   Socioeconomic History  . Marital status: Single    Spouse name: Not on file  . Number of children: 2  . Years of education: Not on file  . Highest education level: Not on file  Occupational History  . Not on file  Tobacco Use  . Smoking status: Never Smoker  . Smokeless tobacco: Never Used  Substance and Sexual Activity  . Alcohol use: No  . Drug use: No  . Sexual activity: Never  Other Topics Concern  . Not on file  Social History Narrative   Lives alone.    Social Determinants of Health   Financial  Resource Strain:   . Difficulty of Paying Living Expenses:   Food Insecurity:   . Worried About Charity fundraiser in the Last Year:   . Arboriculturist in the Last Year:   Transportation Needs:   . Film/video editor (Medical):   Marland Kitchen Lack of Transportation (Non-Medical):   Physical Activity:   . Days of Exercise per Week:   . Minutes of Exercise per Session:   Stress:   . Feeling of Stress :   Social Connections:   . Frequency of Communication with Friends and Family:   . Frequency of Social Gatherings with Friends and Family:   . Attends Religious  Services:   . Active Member of Clubs or Organizations:   . Attends Archivist Meetings:   Marland Kitchen Marital Status:   Intimate Partner Violence:   . Fear of Current or Ex-Partner:   . Emotionally Abused:   Marland Kitchen Physically Abused:   . Sexually Abused:     Review of Systems:  GI: Described in detail in HPI.    Gen: Denies any fever, chills, rigors, night sweats, anorexia, fatigue, weakness, malaise, involuntary weight loss, and sleep disorder CV: Denies chest pain, angina, palpitations, syncope, orthopnea, PND, peripheral edema, and claudication. Resp: Denies dyspnea, cough, sputum, wheezing, coughing up blood. GU : Denies urinary burning, blood in urine, urinary frequency, urinary hesitancy, nocturnal urination, and urinary incontinence. MS: Denies joint pain or swelling.  Denies muscle weakness, cramps, atrophy.  Derm: Denies rash, itching, oral ulcerations, hives, unhealing ulcers.  Psych: Denies depression, anxiety, memory loss, suicidal ideation, hallucinations,  and confusion. Heme: Denies bruising and enlarged lymph nodes. Neuro:  Denies any headaches, dizziness, paresthesias. Endo:  Denies any problems with DM, thyroid, adrenal function.  Physical Exam: Vital signs in last 24 hours: Temp:  [98.1 F (36.7 C)] 98.1 F (36.7 C) (08/18 1138) Pulse Rate:  [94-96] 96 (08/18 1200) Resp:  [21-31] 21 (08/18 1200) BP:  (151-158)/(89) 158/89 (08/18 1200) SpO2:  [100 %] 100 % (08/18 1200)    General:   Alert,  Well-developed, obese, pleasant and cooperative in NAD Head:  Normocephalic and atraumatic. Eyes:  Sclera clear, no icterus.  Prominent pallor Ears:  Normal auditory acuity. Nose:  No deformity, discharge,  or lesions. Mouth:  No deformity or lesions.  Oropharynx pink & moist. Neck:  Supple; no masses or thyromegaly. Lungs:  Clear throughout to auscultation.   No wheezes, crackles, or rhonchi. No acute distress. Heart:  Regular rate and rhythm; no murmurs, clicks, rubs,  or gallops. Extremities: Left arm AV fistula Neurologic:  Alert and  oriented x4;  grossly normal neurologically. Skin:  Intact without significant lesions or rashes. Psych:  Alert and cooperative. Normal mood and affect. Abdomen:  Soft, nontender and nondistended. No masses, hepatosplenomegaly or hernias noted. Normal bowel sounds, without guarding, and without rebound.         Lab Results: Recent Labs    04/02/20 1037  WBC 5.4  HGB 4.2*  HCT 13.0*  PLT 119*   BMET Recent Labs    04/02/20 1037  NA 140  K 4.0  CL 97*  CO2 28  GLUCOSE 102*  BUN 95*  CREATININE 13.27*  CALCIUM 9.1   LFT Recent Labs    04/02/20 1037  PROT 5.5*  ALBUMIN 3.2*  AST 18  ALT 15  ALKPHOS 40  BILITOT 0.6   PT/INR Recent Labs    04/02/20 1149  LABPROT 14.7  INR 1.2    Studies/Results: DG Chest Port 1 View  Result Date: 04/02/2020 CLINICAL DATA:  Altered mental status EXAM: PORTABLE CHEST 1 VIEW COMPARISON:  May 09, 2017 FINDINGS: There is no edema or airspace opacity. There is cardiomegaly with pulmonary vascularity normal. No adenopathy. There is rightward deviation of the upper thoracic trachea. Foci of calcification noted in the carotid artery regions. IMPRESSION: Generalized cardiac enlargement. A degree of underlying pericardial effusion is questioned. Lungs clear. No adenopathy. Apparent carotid artery  calcification bilaterally. Deviation of the thoracic trachea toward the right may be indicative of enlarged thyroid. Electronically Signed   By: Lowella Grip III M.D.   On: 04/02/2020 11:19    Impression:  Symptomatic anemia, coffee-ground emesis, melena with a hemoglobin of 4.2 on admission suspicious of upper GI bleeding Macrocytosis, MCV 101.6 End-stage renal disease on hemodialysis with BUN of 95 and creatinine of 13.27 Hemodynamically stable    Plan: Clear liquid diet today, n.p.o. post midnight for EGD planned in a.m.Marland Kitchen Continue IV Protonix. 3 units of PRBC transfusion ordered. The risks and the benefits of the procedure were discussed with the patient in details. He understands and verbalizes consent.   LOS: 0 days   Ronnette Juniper, MD  04/02/2020, 1:18 PM

## 2020-04-03 ENCOUNTER — Observation Stay (HOSPITAL_COMMUNITY): Payer: Medicaid Other | Admitting: Certified Registered Nurse Anesthetist

## 2020-04-03 ENCOUNTER — Encounter (HOSPITAL_COMMUNITY)
Admission: EM | Disposition: A | Discharge status: Home / Self Care | Payer: Self-pay | Source: Home / Self Care | Attending: Emergency Medicine

## 2020-04-03 ENCOUNTER — Encounter (HOSPITAL_COMMUNITY): Payer: Self-pay | Admitting: Family Medicine

## 2020-04-03 DIAGNOSIS — K920 Hematemesis: Secondary | ICD-10-CM | POA: Diagnosis not present

## 2020-04-03 DIAGNOSIS — K2980 Duodenitis without bleeding: Secondary | ICD-10-CM | POA: Diagnosis present

## 2020-04-03 DIAGNOSIS — D649 Anemia, unspecified: Secondary | ICD-10-CM | POA: Diagnosis not present

## 2020-04-03 DIAGNOSIS — N186 End stage renal disease: Secondary | ICD-10-CM | POA: Diagnosis not present

## 2020-04-03 DIAGNOSIS — I10 Essential (primary) hypertension: Secondary | ICD-10-CM | POA: Diagnosis not present

## 2020-04-03 DIAGNOSIS — K922 Gastrointestinal hemorrhage, unspecified: Secondary | ICD-10-CM | POA: Diagnosis not present

## 2020-04-03 DIAGNOSIS — I130 Hypertensive heart and chronic kidney disease with heart failure and stage 1 through stage 4 chronic kidney disease, or unspecified chronic kidney disease: Secondary | ICD-10-CM | POA: Diagnosis not present

## 2020-04-03 DIAGNOSIS — Z20822 Contact with and (suspected) exposure to covid-19: Secondary | ICD-10-CM | POA: Diagnosis not present

## 2020-04-03 DIAGNOSIS — K3189 Other diseases of stomach and duodenum: Secondary | ICD-10-CM | POA: Diagnosis not present

## 2020-04-03 DIAGNOSIS — Z992 Dependence on renal dialysis: Secondary | ICD-10-CM

## 2020-04-03 HISTORY — PX: ESOPHAGOGASTRODUODENOSCOPY (EGD) WITH PROPOFOL: SHX5813

## 2020-04-03 LAB — CBC
HCT: 20.9 % — ABNORMAL LOW (ref 39.0–52.0)
HCT: 21.8 % — ABNORMAL LOW (ref 39.0–52.0)
Hemoglobin: 6.9 g/dL — CL (ref 13.0–17.0)
Hemoglobin: 7.3 g/dL — ABNORMAL LOW (ref 13.0–17.0)
MCH: 32.2 pg (ref 26.0–34.0)
MCH: 31.6 pg (ref 26.0–34.0)
MCHC: 33 g/dL (ref 30.0–36.0)
MCHC: 33.5 g/dL (ref 30.0–36.0)
MCV: 97.7 fL (ref 80.0–100.0)
MCV: 94.4 fL (ref 80.0–100.0)
Platelets: 121 10*3/uL — ABNORMAL LOW (ref 150–400)
Platelets: 129 10*3/uL — ABNORMAL LOW (ref 150–400)
RBC: 2.14 MIL/uL — ABNORMAL LOW (ref 4.22–5.81)
RBC: 2.31 MIL/uL — ABNORMAL LOW (ref 4.22–5.81)
RDW: 16.8 % — ABNORMAL HIGH (ref 11.5–15.5)
RDW: 16.4 % — ABNORMAL HIGH (ref 11.5–15.5)
WBC: 6.1 10*3/uL (ref 4.0–10.5)
WBC: 7 10*3/uL (ref 4.0–10.5)
nRBC: 0 % (ref 0.0–0.2)
nRBC: 0 % (ref 0.0–0.2)

## 2020-04-03 LAB — COMPREHENSIVE METABOLIC PANEL
ALT: 16 U/L (ref 0–44)
ALT: 16 U/L (ref 0–44)
AST: 18 U/L (ref 15–41)
AST: 17 U/L (ref 15–41)
Albumin: 3.4 g/dL — ABNORMAL LOW (ref 3.5–5.0)
Albumin: 3.4 g/dL — ABNORMAL LOW (ref 3.5–5.0)
Alkaline Phosphatase: 44 U/L (ref 38–126)
Alkaline Phosphatase: 54 U/L (ref 38–126)
Anion gap: 15 (ref 5–15)
Anion gap: 16 — ABNORMAL HIGH (ref 5–15)
BUN: 98 mg/dL — ABNORMAL HIGH (ref 6–20)
BUN: 105 mg/dL — ABNORMAL HIGH (ref 6–20)
CO2: 26 mmol/L (ref 22–32)
CO2: 22 mmol/L (ref 22–32)
Calcium: 9.6 mg/dL (ref 8.9–10.3)
Calcium: 9.5 mg/dL (ref 8.9–10.3)
Chloride: 98 mmol/L (ref 98–111)
Chloride: 100 mmol/L (ref 98–111)
Creatinine, Ser: 15.22 mg/dL — ABNORMAL HIGH (ref 0.61–1.24)
Creatinine, Ser: 16.32 mg/dL — ABNORMAL HIGH (ref 0.61–1.24)
GFR calc Af Amer: 4 mL/min — ABNORMAL LOW (ref >60)
GFR calc Af Amer: 3 mL/min — ABNORMAL LOW (ref >60)
GFR calc non Af Amer: 3 mL/min — ABNORMAL LOW (ref >60)
GFR calc non Af Amer: 3 mL/min — ABNORMAL LOW (ref >60)
Glucose, Bld: 85 mg/dL (ref 70–99)
Glucose, Bld: 110 mg/dL — ABNORMAL HIGH (ref 70–99)
Potassium: 4.6 mmol/L (ref 3.5–5.1)
Potassium: 4.7 mmol/L (ref 3.5–5.1)
Sodium: 139 mmol/L (ref 135–145)
Sodium: 138 mmol/L (ref 135–145)
Total Bilirubin: 0.9 mg/dL (ref 0.3–1.2)
Total Bilirubin: 0.9 mg/dL (ref 0.3–1.2)
Total Protein: 5.8 g/dL — ABNORMAL LOW (ref 6.5–8.1)
Total Protein: 5.8 g/dL — ABNORMAL LOW (ref 6.5–8.1)

## 2020-04-03 LAB — HIV ANTIBODY (ROUTINE TESTING W REFLEX): HIV Screen 4th Generation wRfx: NONREACTIVE

## 2020-04-03 LAB — PHOSPHORUS: Phosphorus: 6.7 mg/dL — ABNORMAL HIGH (ref 2.5–4.6)

## 2020-04-03 LAB — HEMOGLOBIN AND HEMATOCRIT, BLOOD
HCT: 23.4 % — ABNORMAL LOW (ref 39.0–52.0)
Hemoglobin: 7.9 g/dL — ABNORMAL LOW (ref 13.0–17.0)

## 2020-04-03 LAB — PREPARE RBC (CROSSMATCH)

## 2020-04-03 SURGERY — ESOPHAGOGASTRODUODENOSCOPY (EGD) WITH PROPOFOL
Anesthesia: Monitor Anesthesia Care

## 2020-04-03 MED ORDER — LIDOCAINE HCL (PF) 1 % IJ SOLN: 5.0000 mL | INTRAMUSCULAR | Status: DC | PRN

## 2020-04-03 MED ORDER — SODIUM CHLORIDE 0.9 % IV SOLN: 100.0000 mL | INTRAVENOUS | Status: DC | PRN

## 2020-04-03 MED ORDER — PROPOFOL 500 MG/50ML IV EMUL
INTRAVENOUS | Status: DC | PRN
  Administered 2020-04-03: 100 ug/kg/min via INTRAVENOUS

## 2020-04-03 MED ORDER — PANTOPRAZOLE SODIUM 40 MG PO TBEC: 40.0000 mg | DELAYED_RELEASE_TABLET | Freq: Every day | ORAL | Status: DC

## 2020-04-03 MED ORDER — CINACALCET HCL 30 MG PO TABS: 30.0000 mg | ORAL_TABLET | Freq: Two times a day (BID) | ORAL | Status: DC

## 2020-04-03 MED ORDER — SODIUM CHLORIDE 0.9 % IV SOLN: INTRAVENOUS | Status: DC

## 2020-04-03 MED ORDER — PROPOFOL 10 MG/ML IV BOLUS
INTRAVENOUS | Status: DC | PRN
  Administered 2020-04-03 (×2): 20 mg via INTRAVENOUS

## 2020-04-03 MED ORDER — LIDOCAINE 2% (20 MG/ML) 5 ML SYRINGE
INTRAMUSCULAR | Status: DC | PRN
  Administered 2020-04-03: 100 mg via INTRAVENOUS

## 2020-04-03 MED ORDER — HEPARIN SODIUM (PORCINE) 1000 UNIT/ML DIALYSIS: 1000.0000 [IU] | INTRAMUSCULAR | Status: DC | PRN

## 2020-04-03 MED ORDER — LIDOCAINE-PRILOCAINE 2.5-2.5 % EX CREA: 1.0000 "application " | TOPICAL_CREAM | CUTANEOUS | Status: DC | PRN

## 2020-04-03 MED ORDER — SODIUM CHLORIDE 0.9% IV SOLUTION: Freq: Once | INTRAVENOUS | Status: DC

## 2020-04-03 MED ORDER — ALTEPLASE 2 MG IJ SOLR: 2.0000 mg | Freq: Once | INTRAMUSCULAR | Status: DC | PRN

## 2020-04-03 MED ORDER — PENTAFLUOROPROP-TETRAFLUOROETH EX AERO: 1.0000 "application " | INHALATION_SPRAY | CUTANEOUS | Status: DC | PRN

## 2020-04-03 MED ORDER — SODIUM CHLORIDE 0.9 % IV SOLN: INTRAVENOUS | Status: DC | PRN

## 2020-04-03 SURGICAL SUPPLY — 15 items

## 2020-04-03 NOTE — Progress Notes (Signed)
Family Medicine Teaching Service Daily Progress Note Intern Pager: [phone]  Patient name: Devin Bullock Medical record number: [id number] Date of birth: 01-13-1966 Age: 54 y.o. Gender: male  Primary Care Provider: Mauricia Area, MD Consultants: GI, Neprhology Code Status: Full  Pt Overview and Major Events to Date:  Admitted 8/18  Assessment and Plan: Devin Bullock is a 54 y.o. male presenting with altered mental status and anemia in the setting of GI bleed. PMH is significant for hypertension, obesity, cardiomegaly, ESRD on HD MWF,HFmrEF and anemia.  Acute on chronic anemia in setting of suspected Severe Upper GI bleed Hemoglobin on admission found to be 4.2 with macrocytosis, baseline Hgb is around 10. Patient reported an episode of "dark coffee" emesis and melena on 8/16 after taking cinacalcet on an empty stomach. On the day of admission, patient went to hemodialysis and became combative after an hour and was transferred to the hospital via EMS, pt did not remember this episode and reports only remembering that he felt "loopy". Patient's renal medications and supplements do have listed adverse reactions of GI ulcers and since patient took medication differently by not eating before hand (as usual), this may be an adverse reaction to his medications. Patient denies taking NSAIDS, ASA or aspirin. Also denies alcohol consumption or recreational drug use including cocaine. FOBT +. PT and INR within normal limits. Patient was neither tachycardic nor tachypneic on admission, blood pressure was hypertensive with systolic BP in 322G. Patient alert and oriented, with calm demeanor. No oxygen requirement and without abdominal pain, chest pain, SOB or sensation of palpitations. Patient denies any prior episodes of this bleeding and denies any rectal pain on admission. Patient requires admission for severe, symptomatic anemia in setting of HD. -GI following, appreciate recommendations -F/u planned EGD for  this morning -Transfused 3U of PRBC on 8/18 with f/u Hgb of 6.9. Another 1U of PRBC ordered for transfusion. -continue IV Protonix  -Morning CBC and CMP - HIV pending -Vitals per floor protocol   ESRD (on HD MWF) Patient has a history of  ESRD with outpatient hemodialysis on M/W/F. Admission labs: BUN 95, Cr 13.27. Hemodynamically stable. Home meds include: Renagel and cinacalcet. Patient had an incomplete session of hemodialysis on day of admission of ~1 hour prior to onset of episode. -Nephrology consulted, continue HD per outpatient schedule, appreciate recs  -Morning CMP -will start renal diet once patient completes EGD   HTN  On admission BP was elevated at 154/94. Overnight BP ranged from 140-170s/80-100s. Patient home med includes amlodipine 10 but has not been taking in the last two months due to reportedly "low blood pressures". Patient reports measurements of systolic blood pressures in the 120s on days he has HD and so he stopped taking BP medications. He reports typical blood pressures in the 130-140s/70-80s at home without medications. Patient had prior admission with hypertensive emergency and prescribed hydralazine, imdur, labetalol and amlodipine as listed above. Patient's elevated BP is thought to contribute to his progression of renal disease. Patient denies taking any BP medication currently but reports previously taking amlodipine.  -Vitals per routine -plan to restart amlodipine follow GI procedures    HFmrEF  Last echocardiogram in 2014 showed EF of 45-50% severely dilated LV cavity, severe LVH, EF of 45% to 50% and no regional wall motion abnormalities. There was fusion of early and atrial contributions to ventricular filling. LA was severely dilated, RA was moderately dilated, and a moderate, free-flowing pericardial effusion was identified circumferential to the heart, without  evidence of hemodynamic compromise. Patient home medications list hydralazine, amlodipine  and labetalol. Patient had prior admission with hypertensive emergency prior to ESRD status.  -will not obtain echo at this time given severity of anemia  -monitor I/Os   FEN/GI: clear liquids followed by  NPO at [facility]  Prophylaxis: SCDs in setting of acute bleeding   Disposition: med surg   Subjective:  Patient reporting that he needs to leave the hospital to take his 11 year old daughter to school. Last night she was "with an alcoholic and I don't put my trust that far". Patient reports that he understands the risks of trying to leave the hospital since he still has anemia with an unknown GI bleeding source as well as risks since he still has not had dialysis. Patient does not seem to fully understand the implications of wanting to leave AMA and there is confusion on the social situation dealing the daughter and the mother of the child not being aware of the current situation. Patient has been recommended to call the child's mother to see if there is a way she take care of the child so that the patient may remain in the hospital for evaluation and treatment.  Objective: Temp:  [97.5 F (36.4 C)-98.8 F (37.1 C)] 98.8 F (37.1 C) (08/19 0018) Pulse Rate:  [88-105] 105 (08/19 0415) Resp:  [16-32] 16 (08/19 0415) BP: (146-170)/(85-105) 166/97 (08/19 0415) SpO2:  [94 %-100 %] 99 % (08/19 0415) Physical Exam: General: NAD, sitting on the edge of the bed, obese, alert Cardiovascular: RRR Respiratory: no increased WOB, full expansion of chest  Abdomen: non-tender, non-distended Psych: patient demonstrates some understanding of his disease, but does not seem fully aware of social situations and is showing poor planning.   Laboratory: Recent Labs  Lab 04/02/20 1037 04/03/20 0438  WBC 5.4 6.1  HGB 4.2* 6.9*  HCT 13.0* 20.9*  PLT 119* 121*   Recent Labs  Lab 04/02/20 1037 04/03/20 0438  NA 140 139  K 4.0 4.6  CL 97* 98  CO2 28 26  BUN 95* 98*  CREATININE 13.27* 15.22*  CALCIUM  9.1 9.6  PROT 5.5* 5.8*  BILITOT 0.6 0.9  ALKPHOS 40 44  ALT 15 16  AST 18 18  GLUCOSE 102* 85      Imaging/Diagnostic Tests:   Devin Patience, DO 04/03/2020, 9:11 AM PGY-1, [place] Intern pager: [phone], text pages welcome

## 2020-04-03 NOTE — Anesthesia Postprocedure Evaluation (Signed)
Anesthesia Post Note  Patient: WILBURN KEIR  Procedure(s) Performed: ESOPHAGOGASTRODUODENOSCOPY (EGD) WITH PROPOFOL (N/A )     Patient location during evaluation: PACU Anesthesia Type: MAC Level of consciousness: awake and alert Pain management: pain level controlled Vital Signs Assessment: post-procedure vital signs reviewed and stable Respiratory status: spontaneous breathing, nonlabored ventilation, respiratory function stable and patient connected to nasal cannula oxygen Cardiovascular status: stable and blood pressure returned to baseline Postop Assessment: no apparent nausea or vomiting Anesthetic complications: no   No complications documented.  Last Vitals:  Vitals:   04/03/20 1621 04/03/20 1648  BP: (!) 191/65 (!) 162/92  Pulse: 92 87  Resp: (!) 28 (!) 22  Temp:  36.6 C  SpO2: 100% 99%    Last Pain:  Vitals:   04/03/20 1648  TempSrc: Oral  PainSc:                  Effie Berkshire

## 2020-04-03 NOTE — Progress Notes (Signed)
Pending completion of hemodialysis this evening.  He is discharged to community after HD.

## 2020-04-03 NOTE — Progress Notes (Signed)
Met with patient at bedside after receiving page that the patient would like to leave AMA in order to pick up his daughter and take her to school.  Patient states that he was able to make emergency arrangements overnight for his daughter to stay with someone but patient feels that this person is no longer to fit to watch his daughter. Patient verbalized understanding of risks of leaving against medical advice including risk or recurrent bleeding, syncope as well as worsening volume overload in setting of incomplete HD sessions and stated that he still needs to go to help with his daughter. Patient reports he is willing to return once he has been able to get daughter to school and make arrangements for childcare.

## 2020-04-03 NOTE — Interval H&P Note (Signed)
History and Physical Interval Note: 54/male with ESRD on HD with coffee ground emesis, black stools and anemia for an EGD.  04/03/2020 2:19 PM  Devin Bullock  has presented today for EGD, with the diagnosis of melena.  The various methods of treatment have been discussed with the patient and family. After consideration of risks, benefits and other options for treatment, the patient has consented to  Procedure(s): ESOPHAGOGASTRODUODENOSCOPY (EGD) WITH PROPOFOL (N/A) as a surgical intervention.  The patient's history has been reviewed, patient examined, no change in status, stable for surgery.  I have reviewed the patient's chart and labs.  Questions were answered to the patient's satisfaction.     Ronnette Juniper

## 2020-04-03 NOTE — Progress Notes (Signed)
  [facility] Progress Note    Assessment/ Plan:   1. Symptomatic anemia/melena/UGIB: GI consulted, for EGD in today. s/p 3U PRBCs . Aranesp + IV iron given yesterday at his HD unit. hgb 6.9 this am. 2.  ESRD: Partial HD on Mon + wed as an outpatient. Still does not appear overloaded at this time, K stable at 4.6. Will tentatively plan to dialyze today after his endoscopy. 3.  Hypertension/volume: BP slightly high - HD tomorrow. 4.  Metabolic bone disease: Ca ok, Phos pending. Resume binders after allowed to eat. Holding sensipar for now as ?may have played small role in his GIB, unclear as usually does not. 5.  T2DM, mgmt per primary service 6. Hypothyroidism 7. AMS: At HD this morning, presumed d/t #1. Back to baseline at this time.  Discussed with primary service in regards to the issue of him wanting to leave briefly. Will defer this to the primary service and SW/CM. From a volume and potassium status he is okay to wait to do dialysis later tonight (if he comes back or after his procedure).  Dialysis Orders:  MWF at [facility] 4:30hr, 450/800, EDW 133.5kg, 2K/2Ca, UFP #4, AVF, heparin 8000 bolus - Aranesp 48mcg IV weekly - given 8/18 - Hectoral 64mcg IV q HD - Venofer 100 x 5 ordered (s/p 1 dose so far, tsat 24% 8/11)  Gean Quint, MD [facility] 04/03/2020, 9:49 AM   Subjective:   Wants to go home briefly because of his 30 year old daughter, states that he can come back. Denies chest pain, sob.   Objective:   BP (!) 166/97 (BP Location: Right Arm)   Pulse (!) 105   Temp 98.8 F (37.1 C) (Oral)   Resp 16   SpO2 99%   Intake/Output Summary (Last 24 hours) at 04/03/2020 0949 Last data filed at 04/02/2020 2351 Gross per 24 hour  Intake 2778 ml  Output --  Net 2778 ml   Weight change:   Physical Exam: Gen:nad, sitting up at the edge of the bed CVS:s1s2, rrr Resp:cta bl, no w/r/r/c, unlabored, bl chest expansion LGX:QJJH, nd ERD:EYCXK pitting  edema bl le's Neuro: speech clear and coherent, no focal deficits Access: lue avf +b/t  Imaging: DG Chest Port 1 View  Result Date: 04/02/2020 CLINICAL DATA:  Altered mental status EXAM: PORTABLE CHEST 1 VIEW COMPARISON:  May 09, 2017 FINDINGS: There is no edema or airspace opacity. There is cardiomegaly with pulmonary vascularity normal. No adenopathy. There is rightward deviation of the upper thoracic trachea. Foci of calcification noted in the carotid artery regions. IMPRESSION: Generalized cardiac enlargement. A degree of underlying pericardial effusion is questioned. Lungs clear. No adenopathy. Apparent carotid artery calcification bilaterally. Deviation of the thoracic trachea toward the right may be indicative of enlarged thyroid. Electronically Signed   By: Lowella Grip III M.D.   On: 04/02/2020 11:19    Labs: BMET Recent Labs  Lab 04/02/20 1037 04/03/20 0438  NA 140 139  K 4.0 4.6  CL 97* 98  CO2 28 26  GLUCOSE 102* 85  BUN 95* 98*  CREATININE 13.27* 15.22*  CALCIUM 9.1 9.6   CBC Recent Labs  Lab 04/02/20 1037 04/03/20 0438  WBC 5.4 6.1  HGB 4.2* 6.9*  HCT 13.0* 20.9*  MCV 101.6* 97.7  PLT 119* 121*    Medications:    . sodium chloride   Intravenous Once  . Chlorhexidine Gluconate Cloth  6 each Topical [id number]

## 2020-04-03 NOTE — Transfer of Care (Signed)
Immediate Anesthesia Transfer of Care Note  Patient: Devin Bullock  Procedure(s) Performed: ESOPHAGOGASTRODUODENOSCOPY (EGD) WITH PROPOFOL (N/A )  Patient Location: Endoscopy Unit  Anesthesia Type:MAC  Level of Consciousness: awake, alert  and oriented  Airway & Oxygen Therapy: Patient Spontanous Breathing and Patient connected to nasal cannula oxygen  Post-op Assessment: Report given to RN and Post -op Vital signs reviewed and stable  Post vital signs: Reviewed and stable  Last Vitals:  Vitals Value Taken Time  BP 167/69 04/03/20 1611  Temp    Pulse 88 04/03/20 1612  Resp 25 04/03/20 1612  SpO2 98 % 04/03/20 1612  Vitals shown include unvalidated device data.  Last Pain:  Vitals:   04/03/20 1408  TempSrc: Oral  PainSc: 0-No pain      Patients Stated Pain Goal: 4 (38/32/91 9166)  Complications: No complications documented.

## 2020-04-03 NOTE — Anesthesia Preprocedure Evaluation (Signed)
Anesthesia Evaluation  Patient identified by MRN, date of birth, ID band Patient awake    Reviewed: Allergy & Precautions, NPO status , Patient's Chart, lab work & pertinent test results, reviewed documented beta blocker date and time   Airway Mallampati: II  TM Distance: >3 FB Neck ROM: Full    Dental  (+) Teeth Intact, Dental Advisory Given   Pulmonary neg pulmonary ROS,    Pulmonary exam normal breath sounds clear to auscultation       Cardiovascular hypertension, Pt. on medications and Pt. on home beta blockers +CHF  Normal cardiovascular exam Rhythm:Regular Rate:Normal  EF 45-50% on 2014 Echo    Neuro/Psych negative neurological ROS  negative psych ROS   GI/Hepatic Neg liver ROS, melena   Endo/Other  Morbid obesity  Renal/GU Renal Insufficiency, ESRF and DialysisRenal disease (K+ 4.6 ; MWF Dialysis)     Musculoskeletal negative musculoskeletal ROS (+)   Abdominal   Peds  Hematology  (+) Blood dyscrasia (Thrombocytopenia), anemia ,   Anesthesia Other Findings Day of surgery medications reviewed with the patient.  Reproductive/Obstetrics                             Anesthesia Physical Anesthesia Plan  ASA: III  Anesthesia Plan: MAC   Post-op Pain Management:    Induction: Intravenous  PONV Risk Score and Plan: 1 and Propofol infusion and Treatment may vary due to age or medical condition  Airway Management Planned: Nasal Cannula and Natural Airway  Additional Equipment:   Intra-op Plan:   Post-operative Plan:   Informed Consent: I have reviewed the patients History and Physical, chart, labs and discussed the procedure including the risks, benefits and alternatives for the proposed anesthesia with the patient or authorized representative who has indicated his/her understanding and acceptance.     Dental advisory given  Plan Discussed with: CRNA and  Anesthesiologist  Anesthesia Plan Comments:         Anesthesia Quick Evaluation

## 2020-04-03 NOTE — Op Note (Signed)
[facility] Patient Name: Devin Bullock Procedure Date : 04/03/2020 MRN: [id number] Attending MD: Ronnette Juniper , MD Date of Birth: [birth year] CSN: [id number] Age: 54 Admit Type: Inpatient Procedure:                Upper GI endoscopy Indications:              Coffee-ground emesis, black stools, anemia Providers:                Ronnette Juniper, MD, Doristine Johns, RN, Tyna Jaksch                            Technician, Laverda Sorenson, Technician Referring MD:             [facility] Teaching Service Medicines:                Monitored Anesthesia Care Complications:            No immediate complications. Estimated Blood Loss:     Estimated blood loss: none. Procedure:                Pre-Anesthesia Assessment:                           - Prior to the procedure, a History and Physical                            was performed, and patient medications and                            allergies were reviewed. The patient's tolerance of                            previous anesthesia was also reviewed. The risks                            and benefits of the procedure and the sedation                            options and risks were discussed with the patient.                            All questions were answered, and informed consent                            was obtained. Prior Anticoagulants: The patient has                            taken no previous anticoagulant or antiplatelet                            agents. ASA Grade Assessment: III - A patient with                            severe systemic disease. After reviewing the risks  and benefits, the patient was deemed in                            satisfactory condition to undergo the procedure.                           After obtaining informed consent, the endoscope was                            passed under direct vision. Throughout the                            procedure, the patient's blood  pressure, pulse, and                            oxygen saturations were monitored continuously. The                            GIF-H190 ([id number]) Olympus gastroscope was                            introduced through the mouth, and advanced to the                            second part of duodenum. The upper GI endoscopy was                            accomplished without difficulty. The patient                            tolerated the procedure well. Scope In: Scope Out: Findings:      The examined esophagus was normal.      The Z-line was regular.      The entire examined stomach was normal.      Localized severely erythematous mucosa without active bleeding and with       no stigmata of bleeding was found in the duodenal bulb.      The first portion of the duodenum and second portion of the duodenum       were normal.      There was a submucosal lesion, suspicious for a small lipoma in the       gastric fundus. Impression:               - Normal esophagus.                           - Z-line regular.                           - Normal stomach.                           - Erythematous duodenopathy.                           - Normal first portion of the duodenum and second  portion of the duodenum.                           - Gastric fundic lipoma.                           - No specimens collected. Moderate Sedation:      Patient did not receive moderate sedation for this procedure, but       instead received monitored anesthesia care. Recommendation:           - Renal diet.                           - Use Protonix (pantoprazole) 40 mg PO daily for 2                            months.                           - Perform a colonoscopy at appointment to be                            scheduled as an outpatient. Procedure Code(s):        --- Professional ---                           [phone], Esophagogastroduodenoscopy, flexible,                             transoral; diagnostic, including collection of                            specimen(s) by brushing or washing, when performed                            (separate procedure) Diagnosis Code(s):        --- Professional ---                           K31.89, Other diseases of stomach and duodenum                           K92.0, Hematemesis CPT copyright 2019 American Medical Association. All rights reserved. The codes documented in this report are preliminary and upon coder review may  be revised to meet current compliance requirements. Ronnette Juniper, MD 04/03/2020 4:09:35 PM This report has been signed electronically. Number of Addenda: 0

## 2020-04-03 NOTE — Progress Notes (Signed)
NEW ADMISSION NOTE New Admission Note:   Arrival Method: WC Mental Orientation: A&O X4 Telemetry: N/A Assessment: Completed Skin: WDL IV: WDL Pain: Denies Safety Measures: Safety Fall Prevention Plan has been given, discussed and signed Admission: Completed [facility] Orientation: Patient has been orientated to the room, unit and staff.   Orders have been reviewed and implemented. Will continue to monitor the patient. Call light has been placed within reach and bed alarm has been activated.   Aneta Mins BSN, RN3

## 2020-04-03 NOTE — Op Note (Signed)
EGD was performed for coffee-ground emesis, black stools and anemia.  Esophagus appeared unremarkable, small lipoma-like mucosal lesion noted in the fundus, normal gastric cavity otherwise. Severe erythema noted in duodenal bulb without evidence of recent active bleeding, unremarkable 1st-2nd portion of the duodenum otherwise.  Recommend: Resuming renal diet PPI once a day for 2 months Outpatient colonoscopy Okay to DC home from GI standpoint Discussed with Dr. Ouida Sills from family medicine.  Ronnette Juniper, MD

## 2020-04-03 NOTE — Anesthesia Procedure Notes (Signed)
Procedure Name: MAC Date/Time: 04/03/2020 4:49 PM Performed by: Candis Shine, CRNA Pre-anesthesia Checklist: Patient identified, Suction available, Patient being monitored, Timeout performed and Emergency Drugs available Patient Re-evaluated:Patient Re-evaluated prior to induction Oxygen Delivery Method: Nasal cannula Dental Injury: Teeth and Oropharynx as per pre-operative assessment

## 2020-04-03 NOTE — Progress Notes (Addendum)
Interim Progress Note  Just spoke with patient after his EGD. He is expected to have HD this evening around 6pm, after which he can discharge. I told him to arrange for transportation sometime around 10:30-11:00pm.   Also instructed him to take Pantoprazole daily for the next 2 months to help his stomach to heal. Also told him it is vital that he have a colonoscopy done and that he can follow up with [facility] today.   He can now advance to clear liquids as tolerated. I instructed him to slowly increase his diet over the next few days, to consume primarily liquids and soft foods first to make sure he can tolerate it (no abdominal pain, nausea, vomiting, dark stools).   Called nurse to share the information re discharge this evening. Also asked about stat H&H (patient received 1u PRBC this morning and H&H has not yet been drawn due to being in the ED, then EGD, then moved up to new floor).    Devin Bullock, [place], PGY-3 04/03/2020 5:02 PM

## 2020-04-03 NOTE — Progress Notes (Signed)
Patient returned from EGD. Ouida Sills, MD stated that if his H&H was stable that he gould D/C after HD today. MD also stated that she would place him on a clear liquid diet. When I entered the room to inform patient of this information, this RN found him eating a bag of lays potato chips that he had brought from home. Education provided to patient about being on a clear liquid diet, but he stated, "Man I'm hungry. I haven't ate in two days. Food is food." Patient re-educated regarding a lighter diet for his stomach issues, but he continued to eat chips. MD made aware and she placed him on a renal diet. Patient is tolerating well. Plans are to go to HD tonight and then discharge home afterwards.

## 2020-04-03 NOTE — Brief Op Note (Signed)
04/02/2020 - 04/03/2020  4:10 PM  PATIENT:  Rolm Baptise  54 y.o. male  PRE-OPERATIVE DIAGNOSIS:  melena  POST-OPERATIVE DIAGNOSIS:  severe duodenitis   PROCEDURE:  Procedure(s): ESOPHAGOGASTRODUODENOSCOPY (EGD) WITH PROPOFOL (N/A)  SURGEON:  Surgeon(s) and Role:    Ronnette Juniper, MD - Primary  PHYSICIAN ASSISTANT:   ASSISTANTS: Doristine Johns, RN, Tyna Jaksch, Tech   ANESTHESIA:   MAC  EBL:  None  BLOOD ADMINISTERED:none  DRAINS: none   LOCAL MEDICATIONS USED:  NONE  SPECIMEN:  No Specimen  DISPOSITION OF SPECIMEN:  N/A  COUNTS:  YES  TOURNIQUET:  * No tourniquets in log *  DICTATION: .Dragon Dictation  PLAN OF CARE: Admit to inpatient   PATIENT DISPOSITION:  PACU - hemodynamically stable.   Delay start of Pharmacological VTE agent (>24hrs) due to surgical blood loss or risk of bleeding: not applicable

## 2020-04-03 NOTE — ED Notes (Signed)
Pt stated that he needs to leave. Pt stated that he needs to put his daughter on the bus and there is no one available to help. Pt also stated that he wants to schedule his procedure for another day. Informed Crystal - RN.

## 2020-04-03 NOTE — Discharge Instructions (Signed)
Thank you for letting us take care of you here at [facility] had a gastrointestinal bleed that was causing you to lose lots of blood through your gastrointestinal tract.  You were given a total of 4 units of blood during her hospitalization.  An EGD (endoscope) was performed and revealed inflammation of your stomach.    -In order to protect your stomach we are sending you home on a medication called pantoprazole.  You are to take 1 tablet of pantoprazole every day for the next 2 months in order to decrease stomach inflammation and help your stomach heal.  A prescription for this medication has been sent to your pharmacy. -Slowly advance your diet.  We recommend that you only consume liquids for the first couple of days.  If you can tolerate this without any nausea, vomiting, abdominal pain, or black stools,, then you can start consuming softer foods (grits, mashed potatoes).  If you can tolerate these foods then you can resume a normal diet. [facility] performed her EGD, they would be happy to see you outpatient for a colonoscopy. -Please make sure to follow-up with your HD sessions on a regular basis as to prevent you from becoming fluid overloaded. -We strongly recommend you follow-up with your primary care physician for routine health maintenance.    Milus Banister, [place], PGY-3 04/03/2020 5:49 PM

## 2020-04-04 ENCOUNTER — Encounter (HOSPITAL_COMMUNITY): Payer: Self-pay | Admitting: Gastroenterology

## 2020-04-04 DIAGNOSIS — K922 Gastrointestinal hemorrhage, unspecified: Secondary | ICD-10-CM | POA: Diagnosis not present

## 2020-04-04 DIAGNOSIS — I10 Essential (primary) hypertension: Secondary | ICD-10-CM | POA: Diagnosis not present

## 2020-04-04 DIAGNOSIS — K2980 Duodenitis without bleeding: Secondary | ICD-10-CM

## 2020-04-04 DIAGNOSIS — N186 End stage renal disease: Secondary | ICD-10-CM | POA: Diagnosis not present

## 2020-04-04 LAB — TYPE AND SCREEN
ABO/RH(D): O POS
Antibody Screen: NEGATIVE
Unit division: 0
Unit division: 0
Unit division: 0
Unit division: 0

## 2020-04-04 LAB — BPAM RBC
Blood Product Expiration Date: 202108242359
Blood Product Expiration Date: 202109172359
Blood Product Expiration Date: 202109192359
Blood Product Expiration Date: 202109192359
ISSUE DATE / TIME: 202108181313
ISSUE DATE / TIME: 202108181634
ISSUE DATE / TIME: 202108182137
ISSUE DATE / TIME: 202108191055
Unit Type and Rh: 5100
Unit Type and Rh: 5100
Unit Type and Rh: 5100
Unit Type and Rh: 5100

## 2020-04-04 MED ORDER — PANTOPRAZOLE SODIUM 40 MG PO TBEC
40.0000 mg | DELAYED_RELEASE_TABLET | Freq: Every day | ORAL | 0 refills | Status: DC
Start: 2020-04-04 — End: 2020-11-14

## 2020-04-04 NOTE — Hospital Course (Addendum)
Devin Bullock is a 54 y.o. male presenting with altered mental status and anemia in the setting of GI bleed. PMH is significant for hypertension, obesity, cardiomegaly, ESRD on HD MWF,HFmrEF and anemia. ***   Duodenitis, severe,*** Patient reported an episode of ground coffee emesis and melena on 8/16 after taking cinacalcet on an empty stomach. Patient denied NSAIDs, ASA, aspirin, alcohol or drug use. Patient was admitted to the hospital after altered mental status at hemodialysis after 1 hour of treatment on 8/18. GI bleed was suspected with an admission hemoglobin of 4.2, patient was given a total of 4U PRBC for transfusion. GI was consulted and EGD was completed after transfusions administered. EGD remarkable for severe erythema in the duodenal bulb without evidence of recent active bleeding as well as a small lipoma-like mucosal lesion in the fundus. Patient was instructed to resume a renal diet***. GI recommended outpatient colonoscopy and patient was placed on a PPI daily for 2 months. ***  Acute on chronic anemia in setting of suspected severe upper GI bleed Patient reported an episode of ground coffee emesis and melena on 8/16 after taking cinacalcet on an empty stomach. Patient denied NSAIDs, ASA, aspirin, alcohol or drug use. Patient was admitted to the hospital after altered mental status at hemodialysis after 1 hour of treatment on 8/18. Admission labs found hemoglobin to be 4.2; prior labs showed a baseline around 10. Patient was suspected to have an upper GI bleed and was given 3U of PRBC, and IV Protonix and GI was consulted with plans for EGD once transfusions completed. Patient hemoglobin rose to 6.9 and patient was given another 1U of PRBC and EGD was performed***. Hgb on discharge was ***  ESRD Patient has a history of ESRD with outpatient hemodialysis on M/W/F.   Admission labs included a BUN 95 and Cr 13.27, with patient hemodynamically stable. According to patient, he had not  completed a full session of hemodialysis since Friday (8/13). Nephrology was consulted and patient received hemodialysis after completion of the his EGD.***   HTN On admission BP was elevated at 154/94. Patient reported he had not been taking his home BP meds (amlodipine 10mg ***). He reported home pressures ranging in the 130-140s/70-80s. Patient has a history of prior admission with hypertensive emergency and was prescribed hydralazine, imdur, labetalol, and amlodipine. The elevated pressures are thought to contribute to the progression of his renal disease. Amlodipine restarted after EGD for discharge***    Follow-up: Recommend outpatient colonoscopy  Recheck blood pressure with monitoring and medication adjustment as necessary Follow-up echocardiogram

## 2020-04-04 NOTE — Progress Notes (Signed)
  DISCHARGE NOTE    Rolm Baptise to be discharged Home per MD order. Patient verbalized understanding.  Skin clean, dry and intact without evidence of skin break down, no evidence of skin tears noted. IV catheter discontinued intact. Site without signs and symptoms of complications. Dressing and pressure applied. Pt denies pain at the site currently. No complaints noted.  Patient free of lines, drains, and wounds.   Discharge packet assembled. An After Visit Summary (AVS) was printed and given to patient. Patient escorted via wheelchair and discharged to to home via uber per patient.   Britta Mccreedy, RN

## 2020-04-04 NOTE — Progress Notes (Signed)
   04/04/20 0017  Hand-Off documentation  Handoff Given Given to shift RN/LPN  Report given to (Full Name) Britta Mccreedy, RN  Handoff Received Received from shift RN/LPN  Report received from (Full Name) Mays Paino, RN  Vital Signs  Temp 98.2 F (36.8 C)  Temp Source Oral  Pulse Rate 88  Pulse Rate Source Monitor  Resp 18  BP (!) 182/111  BP Location Right Arm  BP Method Automatic  Patient Position (if appropriate) Lying  Oxygen Therapy  SpO2 96 %  O2 Device Room Air  Pain Assessment  Pain Scale 0-10  Pain Score 0  Post-Hemodialysis Assessment  Rinseback Volume (mL) 250 mL  KECN 285 V  Dialyzer Clearance Lightly streaked  Duration of HD Treatment -hour(s) 4 hour(s)  Hemodialysis Intake (mL) 500 mL  UF Total -Machine (mL) 3009 mL  Net UF (mL) 2509 mL  Tolerated HD Treatment Yes  Post-Hemodialysis Comments tx achieved as expected. well tolerated, no complaints, pt stable  AVG/AVF Arterial Site Held (minutes) 10 minutes  AVG/AVF Venous Site Held (minutes) 10 minutes  Education / Care Plan  Dialysis Education Provided Yes  Note  Observations a/ox4,verbally responsive,, states that he feels better.

## 2020-04-04 NOTE — Discharge Summary (Addendum)
[facility] Discharge Summary  Patient name: Devin Bullock Medical record number: [id number] Date of birth: Dec 17, 1965 Age: 54 y.o. Gender: male Date of Admission: 04/02/2020  Date of Discharge: 04/04/2020 Admitting Physician: Eulis Foster, MD  Primary Care Provider: Mauricia Area, MD Consultants: Nephrology, GI  Indication for Hospitalization: Symptomatic Anemia  Discharge Diagnoses/Problem List:  Hypertension Symptomatic anemia ESRD on dialysis Morbid obesity Anemia Congestive heart failure  Disposition: Home  Discharge Condition: Stable, improved  Discharge Exam:  General: NAD, sitting in wheelchair at the door, obese, alert Cardiovascular: RRR Respiratory: no increased WOB, full expansion of chest  Abdomen: non-tender, non-distended Psych: alert and oriented, states understanding of disease progression and demonstrates some insight.  Brief Hospital Course:  Patient presented to the ED from HD due to having altered mentation during his HD session. In the ED, he was found to have hemoglobin of 4.2 with a history of dark stool and dark emesis possibly due to taking medications on an empty stomach. Overall was transfused 4U PRBC and hemoglobin rose to 7.3. GI was consulted and completed an EGD with no active bleeding noted, but with severe duodenitis noted. They recommended pantoprazole daily for 2 months and outpatient follow-up for colonoscopy. Nephrology was consulted and patient completed full HD session on the evening of 8/19-8/20.   Patient has an 24-year-old daughter whom he takes care of by himself. It was very difficulty for him to stay in the hospital due to few resources for child care. Therefore, he opted for outpatient follow-up.  Issues for Follow Up:  Continue pantoprazole p.o. daily for 2 months Slowly advance diet with liquid for the first few days, if tolerating can start consuming simple soft foods and then graduate to a  normal diet. Follow-up colonoscopy outpatient with [facility] Keep hemodialysis appointments on a regular basis. Follow-up with PCP for routine maintenance.  Significant Procedures: EGD 8/19  Significant Labs and Imaging:  Recent Labs  Lab 04/02/20 1037 04/02/20 1037 04/03/20 0438 04/03/20 1754 04/03/20 2010  WBC 5.4  --  6.1  --  7.0  HGB 4.2*   < > 6.9* 7.9* 7.3*  HCT 13.0*   < > 20.9* 23.4* 21.8*  PLT 119*  --  121*  --  129*   < > = values in this interval not displayed.   Recent Labs  Lab 04/02/20 1037 04/02/20 1037 04/03/20 0438 04/03/20 2010  NA 140  --  139 138  K 4.0   < > 4.6 4.7  CL 97*  --  98 100  CO2 28  --  26 22  GLUCOSE 102*  --  85 110*  BUN 95*  --  98* 105*  CREATININE 13.27*  --  15.22* 16.32*  CALCIUM 9.1  --  9.6 9.5  PHOS  --   --   --  6.7*  ALKPHOS 40  --  44 54  AST 18  --  18 17  ALT 15  --  16 16  ALBUMIN 3.2*  --  3.4* 3.4*   < > = values in this interval not displayed.     Results/Tests Pending at Time of Discharge: None  Discharge Medications:  Allergies as of 04/04/2020   No Known Allergies      Medication List     STOP taking these medications    amLODipine 10 MG tablet Commonly known as: NORVASC   calcitRIOL 0.25 MCG capsule Commonly known as: ROCALTROL   furosemide 80 MG tablet Commonly  known as: LASIX   hydrALAZINE 10 MG tablet Commonly known as: APRESOLINE   labetalol 300 MG tablet Commonly known as: NORMODYNE       TAKE these medications    cinacalcet 30 MG tablet Commonly known as: SENSIPAR Take 30 mg by mouth 2 (two) times daily.   multivitamin with minerals Tabs tablet Take 1 tablet by mouth daily.   pantoprazole 40 MG tablet Commonly known as: PROTONIX Take 1 tablet (40 mg total) by mouth daily.   Renagel 800 MG tablet Generic drug: sevelamer Take 800-2,400 mg by mouth See admin instructions. Take 3 tablets with meals 3 times daily and 1 tablet twice daily with snacks.                Discharge Care Instructions  (From admission, onward)           Start     Ordered   04/04/20 0000  No dressing needed        04/04/20 7159            Discharge Instructions: Please refer to Patient Instructions section of EMR for full details.  Patient was counseled important signs and symptoms that should prompt return to medical care, changes in medications, dietary instructions, activity restrictions, and follow up appointments.   Follow-Up Appointments:  Follow-up PCP and [facility] outpatient.    Daisy Floro, DO 04/05/2020, 2:00 PM PGY-3, [place]

## 2020-04-04 NOTE — Progress Notes (Signed)
  Received message from RN that pt just got back from dialysis (12:58 am) and would like to stay over night and discharge tomorrow. Fine for patient to DC in the morning.

## 2020-04-04 NOTE — Progress Notes (Signed)
HD RN Lenna Sciara called to get report for another HD treatment. I let the patient know, patient refused. Per patient " I dont do dialysis like that, I like it every other day. im ready to go home". HDRN  Made aware.

## 2020-04-05 ENCOUNTER — Telehealth (HOSPITAL_COMMUNITY): Payer: Self-pay | Admitting: Nephrology

## 2020-04-05 NOTE — Telephone Encounter (Signed)
Transition of care contact from inpatient facility  Date of discharge: 04/04/2020 Date of contact: 04/05/2020 Method: Phone Spoke to: Patient  Patient contacted to discuss transition of care from recent inpatient hospitalization. Patient was admitted to [facility] from 8/18 - 04/04/2020 with discharge diagnosis of AMS, symptomatic anemia, GIB.  Medication changes were reviewed. Started PPI QD - took last night. Sensipar is on his med list - we discussed holding for now since he really thinks that this contributing the the GI bleeding. Will have the RD at his HD unit f/u and see if can get him on IV Parsabiv instead.  He last dialyzed on Thursday - refused HD on Fri. Denies CP or dyspnea at this time.  Patient will follow up with his/her outpatient HD unit on: Monday.  Asking about handicapped parking sticker -- will ask the provider at his HD unit to complete the form for him next week.  Veneta Penton, PA-C [employer] Pager [phone]

## 2020-04-08 ENCOUNTER — Other Ambulatory Visit: Payer: Self-pay | Admitting: Gastroenterology

## 2020-05-09 ENCOUNTER — Other Ambulatory Visit (HOSPITAL_COMMUNITY): Payer: Medicaid Other

## 2020-05-13 ENCOUNTER — Ambulatory Visit (HOSPITAL_COMMUNITY): Admission: RE | Admit: 2020-05-13 | Payer: Medicaid Other | Source: Home / Self Care | Admitting: Gastroenterology

## 2020-05-13 SURGERY — COLONOSCOPY WITH PROPOFOL
Anesthesia: Monitor Anesthesia Care

## 2020-05-19 DIAGNOSIS — U071 COVID-19: Secondary | ICD-10-CM | POA: Insufficient documentation

## 2020-05-30 ENCOUNTER — Other Ambulatory Visit: Payer: Self-pay | Admitting: Nephrology

## 2020-05-30 DIAGNOSIS — R319 Hematuria, unspecified: Secondary | ICD-10-CM

## 2020-06-03 ENCOUNTER — Other Ambulatory Visit: Payer: Medicaid Other

## 2020-08-13 DIAGNOSIS — M2518 Fistula, other specified site: Secondary | ICD-10-CM | POA: Insufficient documentation

## 2020-08-26 ENCOUNTER — Ambulatory Visit: Payer: Medicaid Other

## 2020-09-09 ENCOUNTER — Ambulatory Visit: Payer: Medicaid Other

## 2020-09-23 ENCOUNTER — Ambulatory Visit: Payer: Medicaid Other

## 2020-10-02 ENCOUNTER — Ambulatory Visit: Payer: Medicaid Other

## 2020-10-16 ENCOUNTER — Ambulatory Visit: Payer: Medicaid Other

## 2020-11-04 ENCOUNTER — Other Ambulatory Visit: Payer: Self-pay | Admitting: Urology

## 2020-11-04 ENCOUNTER — Other Ambulatory Visit (HOSPITAL_COMMUNITY): Payer: Self-pay | Admitting: Urology

## 2020-11-04 DIAGNOSIS — R972 Elevated prostate specific antigen [PSA]: Secondary | ICD-10-CM

## 2020-11-04 DIAGNOSIS — D494 Neoplasm of unspecified behavior of bladder: Secondary | ICD-10-CM

## 2020-11-17 ENCOUNTER — Other Ambulatory Visit: Payer: Self-pay

## 2020-11-18 ENCOUNTER — Ambulatory Visit (INDEPENDENT_AMBULATORY_CARE_PROVIDER_SITE_OTHER): Payer: Medicaid Other | Admitting: Physician Assistant

## 2020-11-18 ENCOUNTER — Other Ambulatory Visit: Payer: Self-pay

## 2020-11-18 VITALS — BP 134/90 | HR 104 | Temp 97.8°F | Resp 20 | Ht 76.0 in | Wt 300.3 lb

## 2020-11-18 DIAGNOSIS — N186 End stage renal disease: Secondary | ICD-10-CM

## 2020-11-18 DIAGNOSIS — Z992 Dependence on renal dialysis: Secondary | ICD-10-CM | POA: Diagnosis not present

## 2020-11-18 NOTE — Progress Notes (Signed)
History of Present Illness:  Patient is a 55 y.o. year old male who presents for exam of his left UE fistula with reported ulceration of the skin.  The left RC fistula was created by Dr. Scot Dock on 05/24/2014.  He denise symptoms of steal, but he has had scab formation over the pale, thin skin are of aneurysm in the distal end of the fistula. He has HD MWF.  Past Medical History:  Diagnosis Date  . Chronic kidney disease   . Enlarged heart   . Hypertension   . Morbid obesity ([place])   . Thyroid disease     Past Surgical History:  Procedure Laterality Date  . AV FISTULA PLACEMENT Left 05/24/2014   Procedure: BRACHIOCEPHALIC ARTERIOVENOUS (AV) FISTULA CREATION;  Surgeon: Angelia Mould, MD;  Location: [place];  Service: Vascular;  Laterality: Left;  . ESOPHAGOGASTRODUODENOSCOPY (EGD) WITH PROPOFOL N/A 04/03/2020   Procedure: ESOPHAGOGASTRODUODENOSCOPY (EGD) WITH PROPOFOL;  Surgeon: Ronnette Juniper, MD;  Location: [place];  Service: Gastroenterology;  Laterality: N/A;  . None       Social History Social History   Tobacco Use  . Smoking status: Never Smoker  . Smokeless tobacco: Never Used  Substance Use Topics  . Alcohol use: No  . Drug use: No    Family History Family History  Problem Relation Age of Onset  . Diabetes Mother   . Hypertension Mother   . Heart attack Mother 63  . Stroke Father   . Hypertension Father   . Depression Father   . Diabetes Brother   . Hypertension Brother   . Stroke Brother 48  . Alcohol abuse Brother   . Asthma Brother     Allergies  No Known Allergies   Current Outpatient Medications  Medication Sig Dispense Refill  . cinacalcet (SENSIPAR) 30 MG tablet Take 30 mg by mouth daily.     . multivitamin (RENA-VIT) TABS tablet Take 1 tablet by mouth daily.    Marland Kitchen RENAGEL 800 MG tablet Take 1,600-3,200 mg by mouth See admin instructions. Take 3200 mg with each meal and 1600 mg with each snack  5   No current facility-administered  medications for this visit.    ROS:   General:  No weight loss, Fever, chills  HEENT: No recent headaches, no nasal bleeding, no visual changes, no sore throat  Neurologic: No dizziness, blackouts, seizures. No recent symptoms of stroke or mini- stroke. No recent episodes of slurred speech, or temporary blindness.  Cardiac: No recent episodes of chest pain/pressure, no shortness of breath at rest.  No shortness of breath with exertion.  Denies history of atrial fibrillation or irregular heartbeat  Vascular: No history of rest pain in feet.  No history of claudication.  No history of non-healing ulcer, No history of DVT   Pulmonary: No home oxygen, no productive cough, no hemoptysis,  No asthma or wheezing  Musculoskeletal:  [ ]  Arthritis, [ ]  Low back pain,  [ ]  Joint pain  Hematologic:No history of hypercoagulable state.  No history of easy bleeding.  positive history of anemia  Gastrointestinal: No hematochezia or melena,  No gastroesophageal reflux, no trouble swallowing  Urinary: [ ]  chronic Kidney disease, [x ] on HD - [ ]  MWF or [ ]  TTHS, [ ]  Burning with urination, [ ]  Frequent urination, [ ]  Difficulty urinating;   Skin: No rashes  Psychological: No history of anxiety,  No history of depression   Physical Examination  Vitals:   11/18/20  1318  BP: 134/90  Pulse: (!) 104  Resp: 20  Temp: 97.8 F (36.6 C)  TempSrc: Temporal  SpO2: 96%  Weight: (!) 300 lb 4.8 oz (136.2 kg)  Height: 6\' 4"  (1.93 m)    Body mass index is 36.55 kg/m.  General:  Alert and oriented, no acute distress HEENT: Normal Neck: No bruit or JVD Pulmonary: Clear to auscultation bilaterally Cardiac: Regular Rate and Rhythm without murmur Gastrointestinal: Soft, non-tender, non-distended, no mass, no scars Skin: No rash    Extremity Pulses:  2+ radial, brachial pulses bilaterally Musculoskeletal: No deformity or edema  Neurologic: Upper and lower extremity motor 5/5 and  symmetric     ASSESSMENT/Plan:  ESRD since 2015 and has had his left forearm fistula functioning well since then.    The skin is thin, shiny and not mobile over the distal aneurysm from repeated sticks over the years.  He has had scab formation before.  I will schedule him for plication of the area.  He has HD MWF.  He is not on anticoagulation.       Roxy Horseman PA-C [facility] Office: [phone]  MD in clinic [place]

## 2020-11-21 ENCOUNTER — Encounter (HOSPITAL_COMMUNITY): Payer: Self-pay | Admitting: Urology

## 2020-11-21 NOTE — Progress Notes (Addendum)
COVID Vaccine Completed: Yes Date COVID Vaccine completed: 11/16/19 Has received booster: NO COVID vaccine manufacturer: Moderna     Date of COVID positive in last 90 days: No  PCP - Mauricia Area, MD Cardiologist - Minus Breeding, MD  Chest x-ray - 04/02/20 in epic EKG - 04/02/2020 in epic Stress Test - greater than 2 years in epic ECHO - greater than 2 years in epic Cardiac Cath - N/A Pacemaker/ICD device last checked: N/A  Sleep Study - N/A CPAP - N/A  Fasting Blood Sugar - N/A Checks Blood Sugar _N/A____ times a day  Blood Thinner Instructions: N/A Aspirin Instructions: N/A Last Dose: N/A  Activity level: Can go up a flight of stairs and activities of daily living without stopping and without symptoms       Anesthesia review: Dialysis  Patient denies shortness of breath, fever, cough and chest pain at PAT appointment   Patient verbalized understanding of instructions that were given to them at the PAT appointment. Patient was also instructed that they will need to review over the PAT instructions again at home before surgery.

## 2020-11-22 ENCOUNTER — Ambulatory Visit: Payer: Medicaid Other

## 2020-11-25 ENCOUNTER — Other Ambulatory Visit: Payer: Self-pay

## 2020-11-25 ENCOUNTER — Encounter (HOSPITAL_COMMUNITY): Payer: Self-pay | Admitting: Urology

## 2020-11-26 ENCOUNTER — Other Ambulatory Visit (HOSPITAL_COMMUNITY)
Admission: RE | Admit: 2020-11-26 | Discharge: 2020-11-26 | Disposition: A | Discharge status: Home / Self Care | Payer: Medicaid Other | Source: Ambulatory Visit | Attending: Urology | Admitting: Urology

## 2020-11-26 ENCOUNTER — Encounter (HOSPITAL_COMMUNITY): Payer: Self-pay | Admitting: Urology

## 2020-11-26 DIAGNOSIS — Z01812 Encounter for preprocedural laboratory examination: Secondary | ICD-10-CM | POA: Diagnosis not present

## 2020-11-26 DIAGNOSIS — Z20822 Contact with and (suspected) exposure to covid-19: Secondary | ICD-10-CM | POA: Diagnosis not present

## 2020-11-26 LAB — SARS CORONAVIRUS 2 (TAT 6-24 HRS): SARS Coronavirus 2: NEGATIVE

## 2020-11-26 MED ORDER — DEXTROSE 5 % IV SOLN
3.0000 g | INTRAVENOUS | Status: AC
  Administered 2020-11-27: 3 g via INTRAVENOUS
  Filled 2020-11-26: qty 3

## 2020-11-26 NOTE — Anesthesia Preprocedure Evaluation (Addendum)
Anesthesia Evaluation  Patient identified by MRN, date of birth, ID band Patient awake    Reviewed: Allergy & Precautions, NPO status , Patient's Chart, lab work & pertinent test results  History of Anesthesia Complications Negative for: history of anesthetic complications  Airway Mallampati: I  TM Distance: >3 FB Neck ROM: Full    Dental  (+) Dental Advisory Given   Pulmonary neg pulmonary ROS,  11/26/2020 SARS coronavirus NEG   breath sounds clear to auscultation       Cardiovascular hypertension (no longer takes medication), (-) angina Rhythm:Regular Rate:Normal  '14 ECHO: severe LVH with EF 45-50%, mild MR '15 stress: EF 50%, with minimal apical thinning   Neuro/Psych negative neurological ROS     GI/Hepatic negative GI ROS, Neg liver ROS,   Endo/Other  Morbid obesity  Renal/GU Dialysis and ESRFRenal disease (K+ 4.8)     Musculoskeletal   Abdominal (+) + obese,   Peds  Hematology  (+) Blood dyscrasia (Hb 10.4), anemia ,   Anesthesia Other Findings   Reproductive/Obstetrics                            Anesthesia Physical Anesthesia Plan  ASA: III  Anesthesia Plan: General   Post-op Pain Management:    Induction:   PONV Risk Score and Plan: 2 and Ondansetron  Airway Management Planned: LMA  Additional Equipment: None  Intra-op Plan:   Post-operative Plan:   Informed Consent: I have reviewed the patients History and Physical, chart, labs and discussed the procedure including the risks, benefits and alternatives for the proposed anesthesia with the patient or authorized representative who has indicated his/her understanding and acceptance.     Dental advisory given  Plan Discussed with: CRNA and Surgeon  Anesthesia Plan Comments:        Anesthesia Quick Evaluation

## 2020-11-27 ENCOUNTER — Ambulatory Visit (HOSPITAL_COMMUNITY)
Admission: RE | Admit: 2020-11-27 | Discharge: 2020-11-27 | Disposition: A | Discharge status: Home / Self Care | Payer: Medicaid Other | Source: Ambulatory Visit | Attending: Urology | Admitting: Urology

## 2020-11-27 ENCOUNTER — Ambulatory Visit (HOSPITAL_COMMUNITY): Payer: Medicaid Other | Admitting: Physician Assistant

## 2020-11-27 ENCOUNTER — Ambulatory Visit (HOSPITAL_COMMUNITY)
Admission: RE | Admit: 2020-11-27 | Discharge: 2020-11-27 | Disposition: A | Discharge status: Home / Self Care | Payer: Medicaid Other | Attending: Urology | Admitting: Urology

## 2020-11-27 ENCOUNTER — Encounter (HOSPITAL_COMMUNITY)
Admission: RE | Disposition: A | Discharge status: Home / Self Care | Payer: Self-pay | Source: Home / Self Care | Attending: Urology

## 2020-11-27 ENCOUNTER — Encounter (HOSPITAL_COMMUNITY): Payer: Self-pay | Admitting: Urology

## 2020-11-27 DIAGNOSIS — R3912 Poor urinary stream: Secondary | ICD-10-CM | POA: Diagnosis not present

## 2020-11-27 DIAGNOSIS — R310 Gross hematuria: Secondary | ICD-10-CM | POA: Insufficient documentation

## 2020-11-27 DIAGNOSIS — R972 Elevated prostate specific antigen [PSA]: Secondary | ICD-10-CM

## 2020-11-27 DIAGNOSIS — N401 Enlarged prostate with lower urinary tract symptoms: Secondary | ICD-10-CM | POA: Diagnosis not present

## 2020-11-27 DIAGNOSIS — D494 Neoplasm of unspecified behavior of bladder: Secondary | ICD-10-CM

## 2020-11-27 DIAGNOSIS — C61 Malignant neoplasm of prostate: Secondary | ICD-10-CM | POA: Diagnosis not present

## 2020-11-27 HISTORY — DX: Anemia, unspecified: D64.9

## 2020-11-27 HISTORY — PX: TRANSURETHRAL RESECTION OF BLADDER TUMOR: SHX2575

## 2020-11-27 HISTORY — PX: PROSTATE BIOPSY: SHX241

## 2020-11-27 HISTORY — DX: Heart failure, unspecified: I50.9

## 2020-11-27 HISTORY — DX: Disorder of parathyroid gland, unspecified: E21.5

## 2020-11-27 HISTORY — DX: Arteriovenous fistula, acquired: I77.0

## 2020-11-27 HISTORY — DX: Dependence on renal dialysis: Z99.2

## 2020-11-27 LAB — CBC
HCT: 31.7 % — ABNORMAL LOW (ref 39.0–52.0)
Hemoglobin: 10.4 g/dL — ABNORMAL LOW (ref 13.0–17.0)
MCH: 32.8 pg (ref 26.0–34.0)
MCHC: 32.8 g/dL (ref 30.0–36.0)
MCV: 100 fL (ref 80.0–100.0)
Platelets: 135 10*3/uL — ABNORMAL LOW (ref 150–400)
RBC: 3.17 MIL/uL — ABNORMAL LOW (ref 4.22–5.81)
RDW: 14.9 % (ref 11.5–15.5)
WBC: 5.6 10*3/uL (ref 4.0–10.5)
nRBC: 0 % (ref 0.0–0.2)

## 2020-11-27 LAB — BASIC METABOLIC PANEL
Anion gap: 13 (ref 5–15)
BUN: 35 mg/dL — ABNORMAL HIGH (ref 6–20)
CO2: 31 mmol/L (ref 22–32)
Calcium: 10.1 mg/dL (ref 8.9–10.3)
Chloride: 99 mmol/L (ref 98–111)
Creatinine, Ser: 9.04 mg/dL — ABNORMAL HIGH (ref 0.61–1.24)
GFR, Estimated: 6 mL/min — ABNORMAL LOW (ref >60)
Glucose, Bld: 97 mg/dL (ref 70–99)
Potassium: 4.8 mmol/L (ref 3.5–5.1)
Sodium: 143 mmol/L (ref 135–145)

## 2020-11-27 SURGERY — TURBT (TRANSURETHRAL RESECTION OF BLADDER TUMOR)
Anesthesia: General | Site: Bladder

## 2020-11-27 MED ORDER — LACTATED RINGERS IV SOLN: INTRAVENOUS | Status: DC

## 2020-11-27 MED ORDER — LIDOCAINE 2% (20 MG/ML) 5 ML SYRINGE
INTRAMUSCULAR | Status: DC | PRN
  Administered 2020-11-27: 100 mg via INTRAVENOUS

## 2020-11-27 MED ORDER — CHLORHEXIDINE GLUCONATE 0.12 % MT SOLN
15.0000 mL | Freq: Once | OROMUCOSAL | Status: AC
  Administered 2020-11-27: 15 mL via OROMUCOSAL

## 2020-11-27 MED ORDER — PROPOFOL 10 MG/ML IV BOLUS
INTRAVENOUS | Status: AC
  Filled 2020-11-27: qty 20

## 2020-11-27 MED ORDER — MIDAZOLAM HCL 2 MG/2ML IJ SOLN: 0.5000 mg | Freq: Once | INTRAMUSCULAR | Status: DC | PRN

## 2020-11-27 MED ORDER — HYDROCODONE-ACETAMINOPHEN 5-325 MG PO TABS: 1.0000 | ORAL_TABLET | ORAL | 0 refills | Status: DC | PRN

## 2020-11-27 MED ORDER — FENTANYL CITRATE (PF) 100 MCG/2ML IJ SOLN
INTRAMUSCULAR | Status: AC
  Filled 2020-11-27: qty 2

## 2020-11-27 MED ORDER — PROMETHAZINE HCL 25 MG/ML IJ SOLN
6.2500 mg | INTRAMUSCULAR | Status: DC | PRN
Start: 2020-11-27 — End: 2020-11-27

## 2020-11-27 MED ORDER — OXYCODONE HCL 5 MG PO TABS: 5.0000 mg | ORAL_TABLET | Freq: Once | ORAL | Status: DC | PRN

## 2020-11-27 MED ORDER — OXYCODONE HCL 5 MG/5ML PO SOLN
5.0000 mg | Freq: Once | ORAL | Status: DC | PRN
Start: 2020-11-27 — End: 2020-11-27

## 2020-11-27 MED ORDER — PHENYLEPHRINE 40 MCG/ML (10ML) SYRINGE FOR IV PUSH (FOR BLOOD PRESSURE SUPPORT)
PREFILLED_SYRINGE | INTRAVENOUS | Status: AC
  Filled 2020-11-27: qty 10

## 2020-11-27 MED ORDER — ACETAMINOPHEN 500 MG PO TABS
1000.0000 mg | ORAL_TABLET | Freq: Once | ORAL | Status: AC
  Administered 2020-11-27: 1000 mg via ORAL
  Filled 2020-11-27: qty 2

## 2020-11-27 MED ORDER — PHENYLEPHRINE 40 MCG/ML (10ML) SYRINGE FOR IV PUSH (FOR BLOOD PRESSURE SUPPORT)
PREFILLED_SYRINGE | INTRAVENOUS | Status: DC | PRN
  Administered 2020-11-27: 80 ug via INTRAVENOUS
  Administered 2020-11-27: 120 ug via INTRAVENOUS
  Administered 2020-11-27: 80 ug via INTRAVENOUS
  Administered 2020-11-27: 120 ug via INTRAVENOUS

## 2020-11-27 MED ORDER — ONDANSETRON HCL 4 MG/2ML IJ SOLN
INTRAMUSCULAR | Status: AC
  Filled 2020-11-27: qty 2

## 2020-11-27 MED ORDER — LIDOCAINE HCL URETHRAL/MUCOSAL 2 % EX GEL
CUTANEOUS | Status: AC
  Filled 2020-11-27: qty 60

## 2020-11-27 MED ORDER — MEPERIDINE HCL 50 MG/ML IJ SOLN: 6.2500 mg | INTRAMUSCULAR | Status: DC | PRN

## 2020-11-27 MED ORDER — EPHEDRINE SULFATE-NACL 50-0.9 MG/10ML-% IV SOSY
PREFILLED_SYRINGE | INTRAVENOUS | Status: DC | PRN
  Administered 2020-11-27: 15 mg via INTRAVENOUS

## 2020-11-27 MED ORDER — FENTANYL CITRATE (PF) 100 MCG/2ML IJ SOLN
25.0000 ug | INTRAMUSCULAR | Status: DC | PRN
  Administered 2020-11-27: 50 ug via INTRAVENOUS

## 2020-11-27 MED ORDER — SODIUM CHLORIDE 0.9 % IR SOLN
Status: DC | PRN
  Administered 2020-11-27: 9000 mL

## 2020-11-27 MED ORDER — ORAL CARE MOUTH RINSE: 15.0000 mL | Freq: Once | OROMUCOSAL | Status: AC

## 2020-11-27 MED ORDER — LIDOCAINE 2% (20 MG/ML) 5 ML SYRINGE
INTRAMUSCULAR | Status: AC
  Filled 2020-11-27: qty 5

## 2020-11-27 MED ORDER — LIDOCAINE HCL 2 % IJ SOLN
INTRAMUSCULAR | Status: AC
  Filled 2020-11-27: qty 20

## 2020-11-27 MED ORDER — PROPOFOL 10 MG/ML IV BOLUS
INTRAVENOUS | Status: DC | PRN
  Administered 2020-11-27: 200 mg via INTRAVENOUS

## 2020-11-27 MED ORDER — FENTANYL CITRATE (PF) 100 MCG/2ML IJ SOLN
INTRAMUSCULAR | Status: DC | PRN
  Administered 2020-11-27: 100 ug via INTRAVENOUS
  Administered 2020-11-27 (×2): 50 ug via INTRAVENOUS

## 2020-11-27 MED ORDER — BELLADONNA ALKALOIDS-OPIUM 16.2-30 MG RE SUPP
RECTAL | Status: AC
  Filled 2020-11-27: qty 1

## 2020-11-27 MED ORDER — FLEET ENEMA 7-19 GM/118ML RE ENEM
1.0000 | ENEMA | Freq: Once | RECTAL | Status: DC
  Filled 2020-11-27: qty 1

## 2020-11-27 MED ORDER — SODIUM CHLORIDE 0.9 % IV SOLN: INTRAVENOUS | Status: DC | PRN

## 2020-11-27 MED ORDER — ONDANSETRON HCL 4 MG/2ML IJ SOLN
INTRAMUSCULAR | Status: DC | PRN
  Administered 2020-11-27: 4 mg via INTRAVENOUS

## 2020-11-27 SURGICAL SUPPLY — 24 items
BAG URINE DRAIN 2000ML AR STRL (UROLOGICAL SUPPLIES) IMPLANT
BAG URO CATCHER STRL LF (MISCELLANEOUS) ×2 IMPLANT
CATH FOLEY 2WAY SLVR  5CC 18FR (CATHETERS)
CATH FOLEY 2WAY SLVR 5CC 18FR (CATHETERS) IMPLANT
COVER SURGICAL LIGHT HANDLE (MISCELLANEOUS) ×2 IMPLANT
COVER WAND RF STERILE (DRAPES) IMPLANT
DRAPE FOOT SWITCH (DRAPES) ×2 IMPLANT
ELECT REM PT RETURN 15FT ADLT (MISCELLANEOUS) ×2 IMPLANT
GLOVE SURG ENC MOIS LTX SZ7.5 (GLOVE) ×2 IMPLANT
GOWN STRL REUS W/TWL XL LVL3 (GOWN DISPOSABLE) ×2 IMPLANT
INST BIOPSY MAXCORE 18GX25 (NEEDLE) IMPLANT
INSTR BIOPSY MAXCORE 18GX20 (NEEDLE) IMPLANT
KIT TURNOVER KIT A (KITS) ×2 IMPLANT
LOOP CUT BIPOLAR 24F LRG (ELECTROSURGICAL) ×2 IMPLANT
MANIFOLD NEPTUNE II (INSTRUMENTS) ×2 IMPLANT
PACK CYSTO (CUSTOM PROCEDURE TRAY) ×2 IMPLANT
PENCIL SMOKE EVACUATOR (MISCELLANEOUS) IMPLANT
PLUG CATH AND CAP STER (CATHETERS) IMPLANT
SYR CONTROL 10ML LL (SYRINGE) IMPLANT
SYR TOOMEY IRRIG 70ML (MISCELLANEOUS)
SYRINGE TOOMEY IRRIG 70ML (MISCELLANEOUS) IMPLANT
TUBING CONNECTING 10 (TUBING) ×2 IMPLANT
TUBING UROLOGY SET (TUBING) ×2 IMPLANT
UNDERPAD 30X36 HEAVY ABSORB (UNDERPADS AND DIAPERS) ×2 IMPLANT

## 2020-11-27 NOTE — H&P (Signed)
CC/HPI: Cc: Voiding trial  HPI:  02/01/2019  Patient has a history of gross hematuria. He underwent a CT scan that revealed possible abnormality on the right side of the bladder wall. At the last visit, cystoscopy was nondiagnostic secondary to gross hematuria. He presents today for repeat cystoscopy. He does also have a history of elevated PSA. This has not been repeated since treatment of UTI. He is currently on dialysis. He currently has a Foley catheter and he is very happy with this. He does not want to go without a Foley catheter.   05/29/2019  Patient's repeat PSA was further elevated to 5.72. He failed to follow-up. He presents for catheter removal. He states that prior to the catheter, he was passing blood clots and this is what prompted the catheter placement. He was also having some difficulty voiding as well as weak stream. He is not on any medication for his prostate.   10/25/2019: Pt started on tamsulosin at time of last OV where his catheter was removed. He was also scheduled for prostate biopsy due to elevated PSA. He did f/u for that evaluation. Presents today as part of referral from his nephrologist for possible urinary retention.    09/18/2020  Patient failed to follow-up for his prostate biopsy. No recent PSA. He comes in today complaining about intermittent gross hematuria since last time he was seen. He makes very little urine. He is on dialysis.   10/30/2020  Patient underwent CT IVP. This revealed a 3 cm bladder mass. He continues to have gross hematuria. He presents for cystoscopy. He is scheduled for a prostate biopsy on 11/13/2020. There was no lymphadenopathy. Kidneys were atrophic. He states that he does not really make any urine.     ALLERGIES: None   MEDICATIONS: Levaquin 750 mg tablet Take 1 tablet PO the morning of your procedure  Amlodipine Besilate  Cinacalcet Hcl  Fosrenol  Multivitamin  Rena Vite  Rocaltrol  Sensipar     GU PSH: Cystoscopy -  02/01/2019, 01/11/2019 Locm 300-399Mg /Ml Iodine,1Ml - 10/21/2020, 2019       PSH Notes: Fistula placed in left arm    NON-GU PSH: None   GU PMH: Hematuria, Unspec - 10/21/2020, - 09/18/2020, - 2019 Elevated PSA - 09/18/2020, - 02/01/2019 BPH w/LUTS - 05/29/2019 Weak Urinary Stream (Stable) - 05/29/2019, - 2019 Kidney Failure Unspec    NON-GU PMH: None   FAMILY HISTORY: Hypertension - Runs in Family   SOCIAL HISTORY: Marital Status: Single Preferred Language: English; Ethnicity: Not Hispanic Or Latino; Race: Black or African American    REVIEW OF SYSTEMS:    GU Review Male:   Patient denies frequent urination, hard to postpone urination, burning/ pain with urination, get up at night to urinate, leakage of urine, stream starts and stops, trouble starting your stream, have to strain to urinate , erection problems, and penile pain.  Gastrointestinal (Upper):   Patient denies nausea, vomiting, and indigestion/ heartburn.  Gastrointestinal (Lower):   Patient denies diarrhea and constipation.  Constitutional:   Patient denies weight loss, night sweats, fatigue, and fever.  Skin:   Patient denies skin rash/ lesion and itching.  Eyes:   Patient denies blurred vision and double vision.  Ears/ Nose/ Throat:   Patient denies sore throat and sinus problems.  Hematologic/Lymphatic:   Patient denies swollen glands and easy bruising.  Cardiovascular:   Patient denies leg swelling and chest pains.  Respiratory:   Patient denies cough and shortness of breath.  Endocrine:  Patient denies excessive thirst.  Musculoskeletal:   Patient denies back pain and joint pain.  Neurological:   Patient denies headaches and dizziness.  Psychologic:   Patient denies depression and anxiety.   Notes: hematuria    VITAL SIGNS:      10/30/2020 10:07 AM  BP 143/86 mmHg  Heart Rate 103 /min  Temperature 96.8 F / 36 C   Complexity of Data:  Source Of History:  Patient  Records Review:   Previous Patient Records   Urine Test Review:   Urinalysis  X-Ray Review: C.T. Abdomen/Pelvis: Reviewed Films. Reviewed Report. Discussed With Patient.     09/18/20 03/15/19 03/07/18  PSA  Total PSA 7.82 ng/mL 5.72 ng/mL 5.24 ng/mL    PROCEDURES:         Flexible Cystoscopy - 52000  Risks, benefits, and some of the potential complications of the procedure were discussed at length with the patient including infection, bleeding, voiding discomfort, urinary retention, fever, chills, sepsis, and others. All questions were answered. Informed consent was obtained. Antibiotic prophylaxis was given. Sterile technique and intraurethral analgesia were used.  Meatus:  Normal size. Normal location. Normal condition.  Urethra:  No strictures.  External Sphincter:  Normal.  Verumontanum:  Normal.  Prostate:  Borderline obstructing. Mild hyperplasia.  Bladder Neck:  Non-obstructing.  Ureteral Orifices:  Normal location. Normal size. Normal shape.   Bladder:  Large bladder tumor just beyond the bladder neck that was about 4-5 cm in size, papillary in nature. Smaller tumor on the left lateral wall. He also had a 1 cm tumor on the dome.      The lower urinary tract was carefully examined. The procedure was well-tolerated and without complications. Antibiotic instructions were given. Instructions were given to call the office immediately for bloody urine, difficulty urinating, urinary retention, painful or frequent urination, fever, chills, nausea, vomiting or other illness. The patient stated that he understood these instructions and would comply with them.         Urinalysis w/Scope Dipstick Dipstick Cont'd Micro  Color: Red Bilirubin: Invalid mg/dL WBC/hpf: 10 - 20/hpf  Appearance: Turbid Ketones: Invalid mg/dL RBC/hpf: >60/hpf  Specific Gravity: Invalid Blood: Invalid ery/uL Bacteria: Mod (26-50/hpf)  pH: Invalid Protein: Invalid mg/dL Cystals: NS (Not Seen)  Glucose: Invalid mg/dL Urobilinogen: Invalid mg/dL Casts: NS (Not  Seen)    Nitrites: Invalid Trichomonas: Not Present    Leukocyte Esterase: Invalid leu/uL Mucous: Not Present      Epithelial Cells: 0 - 5/hpf      Yeast: NS (Not Seen)      Sperm: Not Present    Notes: No dip and unspun micro due to RBC concentration. Read with 1:1 concentration of acetic acid. Renal epi's present.    ASSESSMENT:      ICD-10 Details  1 GU:   Bladder tumor/neoplasm - D41.4 Undiagnosed New Problem  2   Hematuria, Unspec - R31.9 Chronic, Worsening  3   Elevated PSA - R97.20 Chronic, Stable     PLAN:           Orders Labs Urine Culture          Document Letter(s):  Created for Patient: Clinical Summary         Notes:   Plan for cystoscopy with transurethral resection of bladder tumor. He understands potential for bleeding, infection, bladder perforation, injury to surrounding structures, need for additional procedures. Also plan for prostate biopsy under anesthesia at that time.   Cc: Dr. Lorrene Reid  Next Appointment:      Next Appointment: 11/13/2020 08:45 AM    Appointment Type: Prostate Ultrasound Biopsy    Location: [facility] [phone]    Provider: Radiology Rm1 Radiology Rm 1    Reason for Visit: nxt ava trusp-Jasamine Pottinger      Signed by Link Snuffer, III, M.D. on 10/30/20 at 10:54 AM (EDT

## 2020-11-27 NOTE — Anesthesia Postprocedure Evaluation (Signed)
Anesthesia Post Note  Patient: Devin Bullock  Procedure(s) Performed: TRANSURETHRAL RESECTION OF BLADDER TUMOR (TURBT) (N/A Bladder) BIOPSY TRANSRECTAL ULTRASONIC PROSTATE (TUBP) (N/A Bladder)     Patient location during evaluation: PACU Anesthesia Type: General Level of consciousness: awake and alert, patient cooperative and oriented Pain management: pain level controlled Vital Signs Assessment: post-procedure vital signs reviewed and stable Respiratory status: spontaneous breathing, nonlabored ventilation and respiratory function stable Cardiovascular status: blood pressure returned to baseline and stable Postop Assessment: no apparent nausea or vomiting Anesthetic complications: no   No complications documented.  Last Vitals:  Vitals:   11/27/20 0946 11/27/20 1000  BP: 133/70 120/72  Pulse: 90 93  Resp: 20 19  Temp: (!) 35.9 C   SpO2: 99% 97%    Last Pain:  Vitals:   11/27/20 1000  TempSrc:   PainSc: 1                  Brisia Schuermann,E. Rudie Sermons

## 2020-11-27 NOTE — Transfer of Care (Signed)
Immediate Anesthesia Transfer of Care Note  Patient: Devin Bullock  Procedure(s) Performed: TRANSURETHRAL RESECTION OF BLADDER TUMOR (TURBT) (N/A Bladder) BIOPSY TRANSRECTAL ULTRASONIC PROSTATE (TUBP) (N/A Bladder)  Patient Location: PACU  Anesthesia Type:General  Level of Consciousness: awake, alert , oriented and patient cooperative  Airway & Oxygen Therapy: Patient Spontanous Breathing and Patient connected to face mask oxygen  Post-op Assessment: Report given to RN, Post -op Vital signs reviewed and stable and Patient moving all extremities  Post vital signs: Reviewed and stable  Last Vitals:  Vitals Value Taken Time  BP 148/78 11/27/20 0858  Temp    Pulse 94 11/27/20 0900  Resp 23 11/27/20 0900  SpO2 100 % 11/27/20 0900  Vitals shown include unvalidated device data.  Last Pain:  Vitals:   11/27/20 0605  TempSrc: Oral  PainSc:          Complications: No complications documented.

## 2020-11-27 NOTE — Discharge Instructions (Addendum)
General Anesthesia, Adult, Care After This sheet gives you information about how to care for yourself after your procedure. Your health care provider may also give you more specific instructions. If you have problems or questions, contact your health care provider. What can I expect after the procedure? After the procedure, the following side effects are common:  Pain or discomfort at the IV site.  Nausea.  Vomiting.  Sore throat.  Trouble concentrating.  Feeling cold or chills.  Feeling weak or tired.  Sleepiness and fatigue.  Soreness and body aches. These side effects can affect parts of the body that were not involved in surgery. Follow these instructions at home: For the time period you were told by your health care provider:  Rest.  Do not participate in activities where you could fall or become injured.  Do not drive or use machinery.  Do not drink alcohol.  Do not take sleeping pills or medicines that cause drowsiness.  Do not make important decisions or sign legal documents.  Do not take care of children on your own.   Eating and drinking  Follow any instructions from your health care provider about eating or drinking restrictions.  When you feel hungry, start by eating small amounts of foods that are soft and easy to digest (bland), such as toast. Gradually return to your regular diet.  Drink enough fluid to keep your urine pale yellow.  If you vomit, rehydrate by drinking water, juice, or clear broth. General instructions  If you have sleep apnea, surgery and certain medicines can increase your risk for breathing problems. Follow instructions from your health care provider about wearing your sleep device: ? Anytime you are sleeping, including during daytime naps. ? While taking prescription pain medicines, sleeping medicines, or medicines that make you drowsy.  Have a responsible adult stay with you for the time you are told. It is important to have  someone help care for you until you are awake and alert.  Return to your normal activities as told by your health care provider. Ask your health care provider what activities are safe for you.  Take over-the-counter and prescription medicines only as told by your health care provider.  If you smoke, do not smoke without supervision.  Keep all follow-up visits as told by your health care provider. This is important. Contact a health care provider if:  You have nausea or vomiting that does not get better with medicine.  You cannot eat or drink without vomiting.  You have pain that does not get better with medicine.  You are unable to pass urine.  You develop a skin rash.  You have a fever.  You have redness around your IV site that gets worse. Get help right away if:  You have difficulty breathing.  You have chest pain.  You have blood in your urine or stool, or you vomit blood. Summary  After the procedure, it is common to have a sore throat or nausea. It is also common to feel tired.  Have a responsible adult stay with you for the time you are told. It is important to have someone help care for you until you are awake and alert.  When you feel hungry, start by eating small amounts of foods that are soft and easy to digest (bland), such as toast. Gradually return to your regular diet.  Drink enough fluid to keep your urine pale yellow.  Return to your normal activities as told by your health care provider.   Ask your health care provider what activities are safe for you. This information is not intended to replace advice given to you by your health care provider. Make sure you discuss any questions you have with your health care provider. Document Revised: 04/17/2020 Document Reviewed: 11/15/2019 Elsevier Patient Education  2021 [employer]. Transurethral Resection of Bladder Tumor (TURBT) or Bladder Biopsy   Definition:  Transurethral Resection of the Bladder Tumor is  a surgical procedure used to diagnose and remove tumors within the bladder. TURBT is the most common treatment for early stage bladder cancer.  General instructions:     Your recent bladder surgery requires very little post hospital care but some definite precautions.  Despite the fact that no skin incisions were used, the area around the bladder incisions are raw and covered with scabs to promote healing and prevent bleeding. Certain precautions are needed to insure that the scabs are not disturbed over the next 2-4 weeks while the healing proceeds.  Because the raw surface inside your bladder and the irritating effects of urine you may expect frequency of urination and/or urgency (a stronger desire to urinate) and perhaps even getting up at night more often. This will usually resolve or improve slowly over the healing period. You may see some blood in your urine over the first 6 weeks. Do not be alarmed, even if the urine was clear for a while. Get off your feet and drink lots of fluids until clearing occurs. If you start to pass clots or don't improve call us.  Diet:  You may return to your normal diet immediately. Because of the raw surface of your bladder, alcohol, spicy foods, foods high in acid and drinks with caffeine may cause irritation or frequency and should be used in moderation. To keep your urine flowing freely and avoid constipation, drink plenty of fluids during the day (8-10 glasses). Tip: Avoid cranberry juice because it is very acidic.  Activity:  Your physical activity doesn't need to be restricted. However, if you are very active, you may see some blood in the urine. We suggest that you reduce your activity under the circumstances until the bleeding has stopped.  Bowels:  It is important to keep your bowels regular during the postoperative period. Straining with bowel movements can cause bleeding. A bowel movement every other day is reasonable. Use a mild laxative if needed,  such as milk of magnesia 2-3 tablespoons, or 2 Dulcolax tablets. Call if you continue to have problems. If you had been taking narcotics for pain, before, during or after your surgery, you may be constipated. Take a laxative if necessary.    Medication:  You should resume your pre-surgery medications unless told not to. In addition you may be given an antibiotic to prevent or treat infection. Antibiotics are not always necessary. All medication should be taken as prescribed until the bottles are finished unless you are having an unusual reaction to one of the drugs.

## 2020-11-27 NOTE — Op Note (Signed)
Operative Note  Preoperative diagnosis:  1.  Gross hematuria with bladder tumor 2.  Elevated PSA  Postoperative diagnosis: 1.   Bladder tumor--large 2.  Elevated PSA  Procedure(s): 1.  Transurethral resection of bladder tumor--large 2.  Prostate biopsy  Surgeon: Link Snuffer, MD  Assistants: None  Anesthesia: General  Complications: None immediate  EBL: Minimal  Specimens: 1.  Bladder tumor 2.  Prostate: Right base lateral, right base medial, right mid lateral, right mid medial, right apex lateral, right apex medial.  Left base lateral, left base medial, left mid lateral, left mid medial, left apex lateral, left apex medial.  Drains/Catheters: 1.  None  Intraoperative findings: 1.  Normal urethra 2.  With bladder expanded, there was an approximately 5 cm plus papillary bladder tumor that appeared high-grade and possibly invasive on the right lateral wall.  This was completely resected visually.  There were multiple other 1 to 2 cm bladder tumors on the left lateral wall, left anterior wall, right lateral wall closer to the bladder neck.  All tumor.  Completely resected.  Indication: 55 year old male with elevated PSA and gross hematuria was found to have a bladder mass on hematuria work-up.  He presents for the previously mentioned operations.  Description of procedure:  The patient was identified and consent was obtained.  The patient was taken to the operating room and placed in the supine position.  The patient was placed under general anesthesia.  Perioperative antibiotics were administered.  The patient was placed in dorsal lithotomy.  Patient was prepped and draped in a standard sterile fashion and a timeout was performed.  A 26 French resectoscope with the visual obturator in place was advanced into the urethra and into the bladder.  This was exchanged for the resectoscope component.  On bipolar settings, the bladder tumors of interest were completely resected down to  muscle fibers.  All tumors appeared completely resected superficially.  I collected all bladder tumor for specimen.  I fulgurated bladder resection beds and surrounding areas.  There were no areas of bleeding noted.  No other tumors noted.  I drained the bladder and withdrew the scope.  I then inserted a transrectal ultrasound probe.  Sequential images were taken.  There were no hypoechoic areas.  Prostate size 36 g.  I then performed a standard 12 core biopsy with the specimens as listed above.  I then removed the transrectal ultrasound probe.  This concluded the operation.  Patient tolerated the procedure well and was stable postoperative.  Plan: Return in 1 to 2 weeks for pathology review.

## 2020-11-27 NOTE — Anesthesia Procedure Notes (Signed)
Procedure Name: LMA Insertion Date/Time: 11/27/2020 7:43 AM Performed by: [place] Chayce Rullo C, CRNA Pre-anesthesia Checklist: Patient identified, Emergency Drugs available, Suction available and Patient being monitored Patient Re-evaluated:Patient Re-evaluated prior to induction Oxygen Delivery Method: Circle system utilized Preoxygenation: Pre-oxygenation with 100% oxygen Induction Type: IV induction Ventilation: Mask ventilation without difficulty LMA: LMA inserted LMA Size: 5.0 Number of attempts: 1 Airway Equipment and Method: Bite block Placement Confirmation: positive ETCO2 Tube secured with: Tape Dental Injury: Teeth and Oropharynx as per pre-operative assessment

## 2020-11-28 ENCOUNTER — Encounter (HOSPITAL_COMMUNITY): Payer: Self-pay | Admitting: Urology

## 2020-11-28 LAB — SURGICAL PATHOLOGY

## 2020-12-03 ENCOUNTER — Other Ambulatory Visit (HOSPITAL_COMMUNITY): Payer: Medicaid Other

## 2020-12-09 ENCOUNTER — Other Ambulatory Visit: Payer: Self-pay | Admitting: Urology

## 2020-12-09 ENCOUNTER — Other Ambulatory Visit (HOSPITAL_COMMUNITY): Payer: Self-pay | Admitting: Urology

## 2020-12-09 DIAGNOSIS — C61 Malignant neoplasm of prostate: Secondary | ICD-10-CM

## 2020-12-10 ENCOUNTER — Other Ambulatory Visit (HOSPITAL_COMMUNITY): Payer: Medicaid Other

## 2020-12-10 MED ORDER — CEFAZOLIN IN SODIUM CHLORIDE 3-0.9 GM/100ML-% IV SOLN
3.0000 g | INTRAVENOUS | Status: DC
  Filled 2020-12-10: qty 100

## 2020-12-11 ENCOUNTER — Ambulatory Visit (HOSPITAL_COMMUNITY): Admission: RE | Admit: 2020-12-11 | Payer: Medicaid Other | Source: Ambulatory Visit | Admitting: Surgery

## 2020-12-11 ENCOUNTER — Encounter (HOSPITAL_COMMUNITY): Admission: RE | Payer: Self-pay | Source: Ambulatory Visit

## 2020-12-11 SURGERY — REVISON OF ARTERIOVENOUS FISTULA
Anesthesia: Choice

## 2020-12-16 ENCOUNTER — Encounter (HOSPITAL_COMMUNITY)
Admission: RE | Admit: 2020-12-16 | Discharge: 2020-12-16 | Disposition: A | Discharge status: Home / Self Care | Payer: Medicaid Other | Source: Ambulatory Visit | Attending: Urology | Admitting: Urology

## 2020-12-16 ENCOUNTER — Other Ambulatory Visit: Payer: Self-pay

## 2020-12-16 DIAGNOSIS — C61 Malignant neoplasm of prostate: Secondary | ICD-10-CM | POA: Insufficient documentation

## 2020-12-16 MED ORDER — TECHNETIUM TC 99M MEDRONATE IV KIT
20.8000 | PACK | Freq: Once | INTRAVENOUS | Status: AC | PRN
  Administered 2020-12-16: 20.8 via INTRAVENOUS

## 2020-12-30 ENCOUNTER — Other Ambulatory Visit: Payer: Self-pay | Admitting: *Deleted

## 2021-01-06 ENCOUNTER — Encounter (HOSPITAL_COMMUNITY): Payer: Self-pay | Admitting: Vascular Surgery

## 2021-01-06 NOTE — Progress Notes (Signed)
Spoke with pt for pre-op call. Pt denies cardiac history or Diabetes. Pt states he has no one to stay with him the 24 hours after surgery. He first said that a friend would probably stay with him but then stated he had no one. I did encourage him to find someone, but he states he didn't think he could. I did explain to him that he would have to be admitted overnight after surgery if he didn't find anyone. I have called [facility] and left a message on the nurse number informing them of this.  Pt's surgery is scheduled as ambulatory so no Covid test is required prior to surgery.

## 2021-01-07 NOTE — Anesthesia Preprocedure Evaluation (Addendum)
Anesthesia Evaluation  Patient identified by MRN, date of birth, ID band Patient awake    Reviewed: Allergy & Precautions, NPO status , Patient's Chart, lab work & pertinent test results  History of Anesthesia Complications Negative for: history of anesthetic complications  Airway Mallampati: I  TM Distance: >3 FB Neck ROM: Full    Dental  (+) Dental Advisory Given   Pulmonary neg pulmonary ROS,  11/26/2020 SARS coronavirus NEG   breath sounds clear to auscultation       Cardiovascular hypertension, (-) angina Rhythm:Regular Rate:Normal  '14 ECHO: severe LVH with EF 45-50%, mild MR '15 stress: EF 50%, with minimal apical thinning   Neuro/Psych negative neurological ROS     GI/Hepatic negative GI ROS, Neg liver ROS,   Endo/Other  Morbid obesity  Renal/GU Dialysis and ESRFRenal disease (K+ 4.8)     Musculoskeletal   Abdominal (+) + obese,   Peds  Hematology  (+) Blood dyscrasia (Hb 10.4), anemia ,   Anesthesia Other Findings   Reproductive/Obstetrics                             Anesthesia Physical  Anesthesia Plan  ASA: III  Anesthesia Plan: MAC   Post-op Pain Management:    Induction:   PONV Risk Score and Plan: 2 and Ondansetron and Propofol infusion  Airway Management Planned:   Additional Equipment:   Intra-op Plan:   Post-operative Plan:   Informed Consent: I have reviewed the patients History and Physical, chart, labs and discussed the procedure including the risks, benefits and alternatives for the proposed anesthesia with the patient or authorized representative who has indicated his/her understanding and acceptance.       Plan Discussed with: CRNA and Surgeon  Anesthesia Plan Comments:        Anesthesia Quick Evaluation

## 2021-01-08 ENCOUNTER — Observation Stay (HOSPITAL_COMMUNITY)
Admission: RE | Admit: 2021-01-08 | Discharge: 2021-01-08 | Disposition: A | Discharge status: Home / Self Care | Payer: Medicaid Other | Attending: Vascular Surgery | Admitting: Vascular Surgery

## 2021-01-08 ENCOUNTER — Ambulatory Visit (HOSPITAL_COMMUNITY): Payer: Medicaid Other | Admitting: Anesthesiology

## 2021-01-08 ENCOUNTER — Encounter (HOSPITAL_COMMUNITY): Payer: Self-pay | Admitting: Vascular Surgery

## 2021-01-08 ENCOUNTER — Other Ambulatory Visit: Payer: Self-pay

## 2021-01-08 ENCOUNTER — Encounter (HOSPITAL_COMMUNITY)
Admission: RE | Disposition: A | Discharge status: Home / Self Care | Payer: Self-pay | Source: Home / Self Care | Attending: Vascular Surgery

## 2021-01-08 DIAGNOSIS — Z992 Dependence on renal dialysis: Secondary | ICD-10-CM | POA: Insufficient documentation

## 2021-01-08 DIAGNOSIS — T82590A Other mechanical complication of surgically created arteriovenous fistula, initial encounter: Principal | ICD-10-CM | POA: Insufficient documentation

## 2021-01-08 DIAGNOSIS — T82510A Breakdown (mechanical) of surgically created arteriovenous fistula, initial encounter: Secondary | ICD-10-CM

## 2021-01-08 DIAGNOSIS — Z79899 Other long term (current) drug therapy: Secondary | ICD-10-CM | POA: Diagnosis not present

## 2021-01-08 DIAGNOSIS — I120 Hypertensive chronic kidney disease with stage 5 chronic kidney disease or end stage renal disease: Secondary | ICD-10-CM | POA: Diagnosis not present

## 2021-01-08 DIAGNOSIS — N186 End stage renal disease: Secondary | ICD-10-CM

## 2021-01-08 DIAGNOSIS — Y832 Surgical operation with anastomosis, bypass or graft as the cause of abnormal reaction of the patient, or of later complication, without mention of misadventure at the time of the procedure: Secondary | ICD-10-CM

## 2021-01-08 HISTORY — DX: Malignant (primary) neoplasm, unspecified: C80.1

## 2021-01-08 HISTORY — PX: REVISON OF ARTERIOVENOUS FISTULA: SHX6074

## 2021-01-08 LAB — POCT I-STAT, CHEM 8
BUN: 32 mg/dL — ABNORMAL HIGH (ref 6–20)
Calcium, Ion: 1.11 mmol/L — ABNORMAL LOW (ref 1.15–1.40)
Chloride: 95 mmol/L — ABNORMAL LOW (ref 98–111)
Creatinine, Ser: 8.7 mg/dL — ABNORMAL HIGH (ref 0.61–1.24)
Glucose, Bld: 103 mg/dL — ABNORMAL HIGH (ref 70–99)
HCT: 32 % — ABNORMAL LOW (ref 39.0–52.0)
Hemoglobin: 10.9 g/dL — ABNORMAL LOW (ref 13.0–17.0)
Potassium: 4.7 mmol/L (ref 3.5–5.1)
Sodium: 135 mmol/L (ref 135–145)
TCO2: 31 mmol/L (ref 22–32)

## 2021-01-08 SURGERY — REVISON OF ARTERIOVENOUS FISTULA
Anesthesia: Monitor Anesthesia Care | Site: Arm Lower | Laterality: Left

## 2021-01-08 MED ORDER — ACETAMINOPHEN 650 MG RE SUPP
325.0000 mg | RECTAL | Status: DC | PRN
Start: 2021-01-08 — End: 2021-01-08

## 2021-01-08 MED ORDER — METOPROLOL TARTRATE 5 MG/5ML IV SOLN: 2.0000 mg | INTRAVENOUS | Status: DC | PRN

## 2021-01-08 MED ORDER — LIDOCAINE-EPINEPHRINE 0.5 %-1:200000 IJ SOLN
INTRAMUSCULAR | Status: AC
  Filled 2021-01-08: qty 1

## 2021-01-08 MED ORDER — CHLORHEXIDINE GLUCONATE 4 % EX LIQD: 60.0000 mL | Freq: Once | CUTANEOUS | Status: DC

## 2021-01-08 MED ORDER — SEVELAMER CARBONATE 800 MG PO TABS
3200.0000 mg | ORAL_TABLET | Freq: Three times a day (TID) | ORAL | Status: DC
  Administered 2021-01-08: 3200 mg via ORAL
  Filled 2021-01-08: qty 4

## 2021-01-08 MED ORDER — SODIUM CHLORIDE 0.9 % IV SOLN: INTRAVENOUS | Status: DC

## 2021-01-08 MED ORDER — ACETAMINOPHEN 325 MG PO TABS: 325.0000 mg | ORAL_TABLET | ORAL | Status: DC | PRN

## 2021-01-08 MED ORDER — POTASSIUM CHLORIDE CRYS ER 20 MEQ PO TBCR: 20.0000 meq | EXTENDED_RELEASE_TABLET | Freq: Once | ORAL | Status: DC

## 2021-01-08 MED ORDER — PANTOPRAZOLE SODIUM 40 MG PO TBEC: 40.0000 mg | DELAYED_RELEASE_TABLET | Freq: Every day | ORAL | Status: DC

## 2021-01-08 MED ORDER — LIDOCAINE HCL (PF) 1 % IJ SOLN
INTRAMUSCULAR | Status: AC
  Filled 2021-01-08: qty 30

## 2021-01-08 MED ORDER — ONDANSETRON HCL 4 MG/2ML IJ SOLN: 4.0000 mg | Freq: Four times a day (QID) | INTRAMUSCULAR | Status: DC | PRN

## 2021-01-08 MED ORDER — FENTANYL CITRATE (PF) 250 MCG/5ML IJ SOLN
INTRAMUSCULAR | Status: AC
  Filled 2021-01-08: qty 5

## 2021-01-08 MED ORDER — 0.9 % SODIUM CHLORIDE (POUR BTL) OPTIME
TOPICAL | Status: DC | PRN
  Administered 2021-01-08: 1000 mL

## 2021-01-08 MED ORDER — GUAIFENESIN-DM 100-10 MG/5ML PO SYRP: 15.0000 mL | ORAL_SOLUTION | ORAL | Status: DC | PRN

## 2021-01-08 MED ORDER — LIDOCAINE-EPINEPHRINE 1 %-1:100000 IJ SOLN
INTRAMUSCULAR | Status: AC
  Filled 2021-01-08: qty 1

## 2021-01-08 MED ORDER — LIDOCAINE 2% (20 MG/ML) 5 ML SYRINGE
INTRAMUSCULAR | Status: DC | PRN
  Administered 2021-01-08: 60 mg via INTRAVENOUS

## 2021-01-08 MED ORDER — FENTANYL CITRATE (PF) 100 MCG/2ML IJ SOLN: 25.0000 ug | INTRAMUSCULAR | Status: DC | PRN

## 2021-01-08 MED ORDER — SODIUM CHLORIDE 0.9 % IV SOLN
INTRAVENOUS | Status: AC
  Filled 2021-01-08: qty 1.2

## 2021-01-08 MED ORDER — LIDOCAINE-EPINEPHRINE (PF) 1 %-1:200000 IJ SOLN
INTRAMUSCULAR | Status: DC | PRN
  Administered 2021-01-08: 10 mL

## 2021-01-08 MED ORDER — SEVELAMER CARBONATE 800 MG PO TABS
800.0000 mg | ORAL_TABLET | Freq: Three times a day (TID) | ORAL | Status: DC
  Filled 2021-01-08: qty 1

## 2021-01-08 MED ORDER — SODIUM CHLORIDE 0.9 % IV SOLN: INTRAVENOUS | Status: DC | PRN

## 2021-01-08 MED ORDER — DOCUSATE SODIUM 100 MG PO CAPS: 100.0000 mg | ORAL_CAPSULE | Freq: Two times a day (BID) | ORAL | Status: DC

## 2021-01-08 MED ORDER — PROPOFOL 500 MG/50ML IV EMUL
INTRAVENOUS | Status: DC | PRN
  Administered 2021-01-08: 75 ug/kg/min via INTRAVENOUS

## 2021-01-08 MED ORDER — ACETAMINOPHEN 500 MG PO TABS
1000.0000 mg | ORAL_TABLET | Freq: Once | ORAL | Status: AC
  Administered 2021-01-08: 1000 mg via ORAL
  Filled 2021-01-08: qty 2

## 2021-01-08 MED ORDER — MIDAZOLAM HCL 2 MG/2ML IJ SOLN
INTRAMUSCULAR | Status: AC
  Filled 2021-01-08: qty 2

## 2021-01-08 MED ORDER — HYDRALAZINE HCL 20 MG/ML IJ SOLN: 5.0000 mg | INTRAMUSCULAR | Status: DC | PRN

## 2021-01-08 MED ORDER — SODIUM CHLORIDE 0.9 % IV SOLN: 250.0000 mL | INTRAVENOUS | Status: DC | PRN

## 2021-01-08 MED ORDER — SODIUM CHLORIDE 0.9% FLUSH: 3.0000 mL | INTRAVENOUS | Status: DC | PRN

## 2021-01-08 MED ORDER — SODIUM CHLORIDE 0.9% FLUSH
3.0000 mL | Freq: Two times a day (BID) | INTRAVENOUS | Status: DC
  Administered 2021-01-08: 3 mL via INTRAVENOUS

## 2021-01-08 MED ORDER — CHLORHEXIDINE GLUCONATE 0.12 % MT SOLN
OROMUCOSAL | Status: AC
  Administered 2021-01-08: 15 mL
  Filled 2021-01-08: qty 15

## 2021-01-08 MED ORDER — LABETALOL HCL 5 MG/ML IV SOLN: 10.0000 mg | INTRAVENOUS | Status: DC | PRN

## 2021-01-08 MED ORDER — ALUM & MAG HYDROXIDE-SIMETH 200-200-20 MG/5ML PO SUSP: 15.0000 mL | ORAL | Status: DC | PRN

## 2021-01-08 MED ORDER — CINACALCET HCL 30 MG PO TABS: 30.0000 mg | ORAL_TABLET | Freq: Two times a day (BID) | ORAL | Status: DC

## 2021-01-08 MED ORDER — DEXTROSE 5 % IV SOLN
INTRAVENOUS | Status: DC | PRN
  Administered 2021-01-08: 3 g via INTRAVENOUS

## 2021-01-08 MED ORDER — HYDROCODONE-ACETAMINOPHEN 5-325 MG PO TABS: 1.0000 | ORAL_TABLET | ORAL | 0 refills | Status: DC | PRN

## 2021-01-08 MED ORDER — FENTANYL CITRATE (PF) 250 MCG/5ML IJ SOLN
INTRAMUSCULAR | Status: DC | PRN
  Administered 2021-01-08: 50 ug via INTRAVENOUS

## 2021-01-08 MED ORDER — OXYCODONE-ACETAMINOPHEN 5-325 MG PO TABS: 1.0000 | ORAL_TABLET | ORAL | Status: DC | PRN

## 2021-01-08 MED ORDER — PROPOFOL 10 MG/ML IV BOLUS
INTRAVENOUS | Status: AC
  Filled 2021-01-08: qty 40

## 2021-01-08 MED ORDER — MIDAZOLAM HCL 2 MG/2ML IJ SOLN
INTRAMUSCULAR | Status: DC | PRN
  Administered 2021-01-08: 2 mg via INTRAVENOUS

## 2021-01-08 MED ORDER — PHENOL 1.4 % MT LIQD: 1.0000 | OROMUCOSAL | Status: DC | PRN

## 2021-01-08 MED ORDER — CEFAZOLIN IN SODIUM CHLORIDE 3-0.9 GM/100ML-% IV SOLN
INTRAVENOUS | Status: AC
  Filled 2021-01-08: qty 100

## 2021-01-08 SURGICAL SUPPLY — 55 items
ARMBAND PINK RESTRICT EXTREMIT (MISCELLANEOUS) ×3 IMPLANT
BAG DECANTER FOR FLEXI CONT (MISCELLANEOUS) ×3 IMPLANT
BANDAGE ESMARK 6X9 LF (GAUZE/BANDAGES/DRESSINGS) ×2 IMPLANT
BIOPATCH RED 1 DISK 7.0 (GAUZE/BANDAGES/DRESSINGS) IMPLANT
BNDG ESMARK 4X9 LF (GAUZE/BANDAGES/DRESSINGS) ×3 IMPLANT
BNDG ESMARK 6X9 LF (GAUZE/BANDAGES/DRESSINGS) ×3
CANISTER SUCT 3000ML PPV (MISCELLANEOUS) ×3 IMPLANT
CATH PALINDROME-P 19CM W/VT (CATHETERS) IMPLANT
CATH PALINDROME-P 23CM W/VT (CATHETERS) IMPLANT
CATH PALINDROME-P 28CM W/VT (CATHETERS) IMPLANT
CLIP LIGATING EXTRA MED SLVR (CLIP) ×3 IMPLANT
CLIP LIGATING EXTRA SM BLUE (MISCELLANEOUS) ×3 IMPLANT
CLIP VESOCCLUDE MED 6/CT (CLIP) IMPLANT
CLIP VESOCCLUDE SM WIDE 6/CT (CLIP) IMPLANT
COVER PROBE W GEL 5X96 (DRAPES) ×3 IMPLANT
COVER SURGICAL LIGHT HANDLE (MISCELLANEOUS) ×3 IMPLANT
COVER WAND RF STERILE (DRAPES) IMPLANT
CUFF TOURN SGL QUICK 34 (TOURNIQUET CUFF) ×1
CUFF TRNQT CYL 34X4.125X (TOURNIQUET CUFF) ×2 IMPLANT
DERMABOND ADVANCED (GAUZE/BANDAGES/DRESSINGS) ×1
DERMABOND ADVANCED .7 DNX12 (GAUZE/BANDAGES/DRESSINGS) ×2 IMPLANT
DRAPE C-ARM 42X72 X-RAY (DRAPES) IMPLANT
DRAPE CHEST BREAST 15X10 FENES (DRAPES) IMPLANT
ELECT REM PT RETURN 9FT ADLT (ELECTROSURGICAL) ×3
ELECTRODE REM PT RTRN 9FT ADLT (ELECTROSURGICAL) ×2 IMPLANT
GAUZE 4X4 16PLY RFD (DISPOSABLE) IMPLANT
GLOVE BIO SURGEON STRL SZ7.5 (GLOVE) ×3 IMPLANT
GOWN STRL REUS W/ TWL LRG LVL3 (GOWN DISPOSABLE) ×4 IMPLANT
GOWN STRL REUS W/ TWL XL LVL3 (GOWN DISPOSABLE) ×2 IMPLANT
GOWN STRL REUS W/TWL LRG LVL3 (GOWN DISPOSABLE) ×2
GOWN STRL REUS W/TWL XL LVL3 (GOWN DISPOSABLE) ×1
KIT BASIN OR (CUSTOM PROCEDURE TRAY) ×3 IMPLANT
KIT PALINDROME-P 55CM (CATHETERS) IMPLANT
KIT TURNOVER KIT B (KITS) ×3 IMPLANT
NEEDLE 18GX1X1/2 (RX/OR ONLY) (NEEDLE) ×3 IMPLANT
NEEDLE HYPO 25GX1X1/2 BEV (NEEDLE) IMPLANT
NS IRRIG 1000ML POUR BTL (IV SOLUTION) ×3 IMPLANT
PACK CV ACCESS (CUSTOM PROCEDURE TRAY) ×3 IMPLANT
PACK SURGICAL SETUP 50X90 (CUSTOM PROCEDURE TRAY) IMPLANT
PAD ARMBOARD 7.5X6 YLW CONV (MISCELLANEOUS) ×6 IMPLANT
SOAP 2 % CHG 4 OZ (WOUND CARE) IMPLANT
SUT ETHILON 3 0 PS 1 (SUTURE) IMPLANT
SUT MNCRL AB 4-0 PS2 18 (SUTURE) ×3 IMPLANT
SUT PROLENE 5 0 C 1 24 (SUTURE) ×3 IMPLANT
SUT PROLENE 6 0 BV (SUTURE) ×3 IMPLANT
SUT VIC AB 3-0 SH 27 (SUTURE) ×1
SUT VIC AB 3-0 SH 27X BRD (SUTURE) ×2 IMPLANT
SYR 10ML LL (SYRINGE) IMPLANT
SYR 20ML LL LF (SYRINGE) ×3 IMPLANT
SYR 5ML LL (SYRINGE) IMPLANT
SYR CONTROL 10ML LL (SYRINGE) ×3 IMPLANT
TOWEL GREEN STERILE (TOWEL DISPOSABLE) ×3 IMPLANT
TOWEL GREEN STERILE FF (TOWEL DISPOSABLE) ×6 IMPLANT
UNDERPAD 30X36 HEAVY ABSORB (UNDERPADS AND DIAPERS) ×3 IMPLANT
WATER STERILE IRR 1000ML POUR (IV SOLUTION) ×3 IMPLANT

## 2021-01-08 NOTE — Op Note (Signed)
    Patient name: Devin Bullock MRN: [id number] DOB: 1965-10-11 Sex: male  01/08/2021 Pre-operative Diagnosis: esrd, left arm AV fistula pseudoaneurysm Post-operative diagnosis:  Same Surgeon:  Eda Paschal. Donzetta Matters, MD Assistant: Paulo Fruit, PA Procedure Performed:  Revision of left arm AV fistula with plication of pseudoaneurysm  Indications: 55 year old male with end-stage renal disease on dialysis via left forearm AV fistula.  He has a pseudoaneurysm that has had bleeding from and is scabbed.  He is indicated for revision.  We evaluated in preoperative area and he has enough remaining fistula for cannulation after revision and we are not planning for tunneled dialysis catheter.  An assistant was necessary to expedite the case.  Findings: Fistula was communicating to the skin this was excised under tourniquet and the fistula was primarily plicated and the skin closed over top.   Procedure:  The patient was identified in the holding area and taken to the operating was placed supine operative table and MAC anesthesia was induced.  He was sterilely prepped draped the left arm in the usual fashion, antibiotics were administered and a timeout was called.  We mapped out his fistula with ultrasound.  Elliptical type incision was drawn on the skin around the scabbed area.  A tourniquet was placed in the upper arm.  The area around the planned incision was anesthetized 1% lidocaine with epinephrine.  An Esmarch was used on the left arm and the tourniquet was inflated to 250 mmHg.  10 blade was used to excise the previously marked elliptical type incision.  The fistula was filled with heparinized saline in both directions.  We mobilized the fistula away from the skin circumferentially using scissors and cautery.  The fistula was then closed with running 5-0 Prolene suture in a mattress fashion and filled with heparinized saline.  The tourniquet was allowed down.  We obtain hemostasis and irrigated the wound.   There was a good thrill in the fistula both proximally and distally to our incision.  We closed the skin with Vicryl and Monocryl over the fistula.  Dermabond was placed of the skin.  He was then awakened from anesthesia having tolerated procedure without any complication.  All counts were correct at completion  EBL: 10cc   Jarelle Ates C. Donzetta Matters, MD [facility] [place] Office: [phone] Pager: [phone]

## 2021-01-08 NOTE — Progress Notes (Signed)
Devin Bullock to be D/C'd home per MD order. Discussed with the patient and all questions fully answered.  Skin clean, dry and intact without evidence of skin break down, no evidence of skin tears noted.  IV catheter discontinued intact. Site without signs and symptoms of complications. Dressing and pressure applied.  An After Visit Summary was printed and given to the patient.  Patient escorted via [place], and D/C home via private auto.  Devin Bullock  01/08/2021 3:11 PM

## 2021-01-08 NOTE — TOC Initial Note (Signed)
Transition of Care [facility]) - Initial/Assessment Note    Patient Details  Name: Devin Bullock MRN: [id number] Date of Birth: [birth year]  [facility] of [facility]) CM/SW Contact:    Verdell Carmine, RN Phone Number: 01/08/2021, 10:24 AM  Clinical Narrative:                 Patient in with psudoaneuysm  Fistula, bleeding. patinet is under observation  And in OR for revision of graft.  No barriers identified. For discharge  Expected Discharge Plan: Home/Self Care Barriers to Discharge: No Barriers Identified   Patient Goals and CMS Choice        Expected Discharge Plan and Services Expected Discharge Plan: Home/Self Care       Living arrangements for the past 2 months: Apartment                                      Prior Living Arrangements/Services Living arrangements for the past 2 months: Apartment   Patient language and need for interpreter reviewed:: Yes        Need for Family Participation in Patient Care: Yes (Comment) Care giver support system in place?: Yes (comment)   Criminal Activity/Legal Involvement Pertinent to Current Situation/Hospitalization: No - Comment as needed  Activities of Daily Living Home Assistive Devices/Equipment: Eyeglasses ADL Screening (condition at time of admission) Patient's cognitive ability adequate to safely complete daily activities?: Yes Is the patient deaf or have difficulty hearing?: No Does the patient have difficulty seeing, even when wearing glasses/contacts?: No Does the patient have difficulty concentrating, remembering, or making decisions?: No Patient able to express need for assistance with ADLs?: Yes Does the patient have difficulty dressing or bathing?: No Independently performs ADLs?: Yes (appropriate for developmental age) Does the patient have difficulty walking or climbing stairs?: No Weakness of Legs: None Weakness of Arms/Hands: None  Permission Sought/Granted                  Emotional  Assessment       Orientation: : Oriented to Self,Oriented to Place,Oriented to  Time,Oriented to Situation Alcohol / Substance Use: Not Applicable Psych Involvement: No (comment)  Admission diagnosis:  ESRD (end stage renal disease) on dialysis ([place]) [N18.6, Z99.2] Patient Active Problem List   Diagnosis Date Noted  . HTN (hypertension) with goal to be determined   . Duodenitis   . Symptomatic anemia 04/02/2020  . Upper GI bleed   . CKD (chronic kidney disease) 07/03/2014  . ESRD (end stage renal disease) on dialysis ([place]) 08/08/2013  . Chronic kidney disease (CKD), stage IV (severe) ([place]) 04/17/2013  . Patient nonadherence 04/17/2013  . Morbid obesity ([place]) 04/15/2013  . Anemia 04/15/2013  . Cardiomegaly 04/13/2013  . Hypertensive emergency 04/13/2013  . Acute on chronic combined systolic and diastolic CHF (congestive heart failure) ([place]) 04/13/2013   PCP:  Mauricia Area, MD Pharmacy:   [id number] - [place], [place] [address] [place] [address] Phone: [phone] Fax: [phone]  [id number] - [place], [place] [address] [place] [address] Phone: [phone] Fax: [phone]     Social Determinants of Health (SDOH) Interventions    Readmission Risk Interventions No flowsheet data found.

## 2021-01-08 NOTE — H&P (Addendum)
History of Present Illness:  Patient is a 55 y.o. year old male who presents for exam of his left UE fistula with reported ulceration of the skin.  The left RC fistula was created by Dr. Scot Dock on 05/24/2014.             He denise symptoms of steal, but he has had scab formation over the pale, thin skin are of aneurysm in the distal end of the fistula. He has HD MWF.      Past Medical History:  Diagnosis Date  . Chronic kidney disease   . Enlarged heart   . Hypertension   . Morbid obesity ([place])   . Thyroid disease          Past Surgical History:  Procedure Laterality Date  . AV FISTULA PLACEMENT Left 05/24/2014   Procedure: BRACHIOCEPHALIC ARTERIOVENOUS (AV) FISTULA CREATION;  Surgeon: Angelia Mould, MD;  Location: [facility];  Service: Vascular;  Laterality: Left;  . ESOPHAGOGASTRODUODENOSCOPY (EGD) WITH PROPOFOL N/A 04/03/2020   Procedure: ESOPHAGOGASTRODUODENOSCOPY (EGD) WITH PROPOFOL;  Surgeon: Ronnette Juniper, MD;  Location: [place];  Service: Gastroenterology;  Laterality: N/A;  . None       Social History Social History       Tobacco Use  . Smoking status: Never Smoker  . Smokeless tobacco: Never Used  Substance Use Topics  . Alcohol use: No  . Drug use: No    Family History      Family History  Problem Relation Age of Onset  . Diabetes Mother   . Hypertension Mother   . Heart attack Mother 54  . Stroke Father   . Hypertension Father   . Depression Father   . Diabetes Brother   . Hypertension Brother   . Stroke Brother 48  . Alcohol abuse Brother   . Asthma Brother     Allergies  No Known Allergies         Current Outpatient Medications  Medication Sig Dispense Refill  . cinacalcet (SENSIPAR) 30 MG tablet Take 30 mg by mouth daily.     . multivitamin (RENA-VIT) TABS tablet Take 1 tablet by mouth daily.    Marland Kitchen RENAGEL 800 MG tablet Take 1,600-3,200 mg by mouth See admin instructions. Take 3200 mg with  each meal and 1600 mg with each snack  5   No current facility-administered medications for this visit.    ROS:   General:  No weight loss, Fever, chills  HEENT: No recent headaches, no nasal bleeding, no visual changes, no sore throat  Neurologic: No dizziness, blackouts, seizures. No recent symptoms of stroke or mini- stroke. No recent episodes of slurred speech, or temporary blindness.  Cardiac: No recent episodes of chest pain/pressure, no shortness of breath at rest.  No shortness of breath with exertion.  Denies history of atrial fibrillation or irregular heartbeat  Vascular: No history of rest pain in feet.  No history of claudication.  No history of non-healing ulcer, No history of DVT   Pulmonary: No home oxygen, no productive cough, no hemoptysis,  No asthma or wheezing  Musculoskeletal:  [ ]  Arthritis, [ ]  Low back pain,  [ ]  Joint pain  Hematologic:No history of hypercoagulable state.  No history of easy bleeding.  positive history of anemia  Gastrointestinal: No hematochezia or melena,  No gastroesophageal reflux, no trouble swallowing  Urinary: [ ]  chronic Kidney disease, [x ] on HD - [ ]  MWF or [ ]  TTHS, [ ]  Burning  with urination, [ ]  Frequent urination, [ ]  Difficulty urinating;   Skin: No rashes  Psychological: No history of anxiety,  No history of depression   Physical Examination  Vitals:   01/08/21 0607  BP: 104/62  Pulse: 98  Resp: 17  Temp: 98.1 F (36.7 C)  SpO2: 97%     General:  Alert and oriented, no acute distress HEENT: Normal Neck: No bruit or JVD Pulmonary: Clear to auscultation bilaterally Cardiac: Regular Rate and Rhythm without murmur Gastrointestinal: Soft, non-tender, non-distended, no mass, no scars Skin: No rash    Extremity Pulses:  2+ radial, brachial pulses bilaterally Musculoskeletal: No deformity or edema      Neurologic: Upper and lower extremity motor 5/5 and  symmetric     ASSESSMENT/Plan:  ESRD since 2015 and has had his left forearm fistula functioning well since then.    The skin is thin, shiny and not mobile over the distal aneurysm from repeated sticks over the years.  He has had scab formation before.  I will schedule him for plication of the area.  He has HD MWF.  He is not on anticoagulation.    Plan for admission post-op as he stays alone.   Jahvon Gosline C. Donzetta Matters, MD [facility] [place] Office: [phone] Pager: [phone]

## 2021-01-08 NOTE — Anesthesia Postprocedure Evaluation (Signed)
Anesthesia Post Note  Patient: Devin Bullock  Procedure(s) Performed: REVISON PLICATION OF ARTERIOVENOUS FISTULA LEFT (Left Arm Lower)     Patient location during evaluation: PACU Anesthesia Type: MAC Level of consciousness: awake and alert Pain management: pain level controlled Vital Signs Assessment: post-procedure vital signs reviewed and stable Respiratory status: spontaneous breathing, nonlabored ventilation, respiratory function stable and patient connected to nasal cannula oxygen Cardiovascular status: stable and blood pressure returned to baseline Postop Assessment: no apparent nausea or vomiting Anesthetic complications: no   No complications documented.  Last Vitals:  Vitals:   01/08/21 0845 01/08/21 1016  BP: 129/69 118/63  Pulse: 89 97  Resp: 20 19  Temp: 36.6 C 36.6 C  SpO2: 100% 100%    Last Pain:  Vitals:   01/08/21 1016  TempSrc: Oral  PainSc:                  Tiajuana Amass

## 2021-01-08 NOTE — Progress Notes (Addendum)
Renal Navigator received message from Dr. Augustin Coupe that patient is being kept overnight after [facility] procedure today. He dialyzes at [facility] on MWF first shift. [facility] clinic manager states he can accommodate patient tomorrow after discharge if he arrives at 11:00am.  Navigator contacted patient to discuss plan for HD tomorrow and ensure transportation. Patient states he uses [employer] through his insurance. He has many reasons why he does not want to go to his outpt clinic tomorrow for HD and asks Navigator why he has to stay overnight tonight, stating, "I feel fine!" He reports that he has a friend who can pick him up later today, if MD would clear him for discharge, who could also check on him if needed. Navigator offered to speak with team to see how they feel about allowing patient to discharge tonight vs in the am per patient wishes and so he HD/transportation can stay on his regular schedule tomorrow.  Navigator spoke with [facility] PA/C. Baglia who states patient can discharge today and that she will speak with Dr. Donzetta Matters. Navigator is appreciative of this and updated Dr. Augustin Coupe of plan. Navigator also updated outpatient HD Charge RN and [facility] manager.  If patient ends up needing transportation home, please contact TOC for [employer].  Alphonzo Cruise, [place] Renal Navigator [phone]

## 2021-01-08 NOTE — Anesthesia Procedure Notes (Signed)
Procedure Name: MAC Date/Time: 01/08/2021 7:30 AM Performed by: Mariea Clonts, CRNA Pre-anesthesia Checklist: Patient identified, Emergency Drugs available, Suction available, Patient being monitored and Timeout performed Patient Re-evaluated:Patient Re-evaluated prior to induction Oxygen Delivery Method: Simple face mask

## 2021-01-08 NOTE — Discharge Instructions (Signed)
[facility]  Discharge Instructions  AV Fistula or Graft Surgery for Dialysis Access  Please refer to the following instructions for your post-procedure care. Your surgeon or physician assistant will discuss any changes with you.  Activity  You may drive the day following your surgery, if you are comfortable and no longer taking prescription pain medication. Resume full activity as the soreness in your incision resolves.  Bathing/Showering  You may shower after you go home. Keep your incision dry for 48 hours. Do not soak in a bathtub, hot tub, or swim until the incision heals completely. You may not shower if you have a hemodialysis catheter.  Incision Care  Clean your incision with mild soap and water after 48 hours. Pat the area dry with a clean towel. You do not need a bandage unless otherwise instructed. Do not apply any ointments or creams to your incision. You may have skin glue on your incision. Do not peel it off. It will come off on its own in about one week. Your arm may swell a bit after surgery. To reduce swelling use pillows to elevate your arm so it is above your heart. Your doctor will tell you if you need to lightly wrap your arm with an ACE bandage.  Diet  Resume your normal diet. There are not special food restrictions following this procedure. In order to heal from your surgery, it is CRITICAL to get adequate nutrition. Your body requires vitamins, minerals, and protein. Vegetables are the best source of vitamins and minerals. Vegetables also provide the perfect balance of protein. Processed food has little nutritional value, so try to avoid this.  Medications  Resume taking all of your medications. If your incision is causing pain, you may take over-the counter pain relievers such as acetaminophen (Tylenol). If you were prescribed a stronger pain medication, please be aware these medications can cause nausea and constipation. Prevent  nausea by taking the medication with a snack or meal. Avoid constipation by drinking plenty of fluids and eating foods with high amount of fiber, such as fruits, vegetables, and grains.  Do not take Tylenol if you are taking prescription pain medications.  Follow up Your surgeon may want to see you in the office following your access surgery. If so, this will be arranged at the time of your surgery.  Please call us immediately for any of the following conditions:  . Increased pain, redness, drainage (pus) from your incision site . Fever of 101 degrees or higher . Severe or worsening pain at your incision site . Hand pain or numbness. .  Reduce your risk of vascular disease:  . Stop smoking. If you would like help, call [facility] at 1-800-QUIT-NOW 308-028-9638) or [place] at 480-688-1754  . Manage your cholesterol . Maintain a desired weight . Control your diabetes . Keep your blood pressure down  Dialysis  It will take several weeks to several months for your new dialysis access to be ready for use. Your surgeon will determine when it is okay to use it. Your nephrologist will continue to direct your dialysis. You can continue to use your Permcath until your new access is ready for use.   01/08/2021 Devin Bullock [id number] 12-27-1965  Surgeon(s): Devin Sandy, MD  Procedure(s): REVISON PLICATION OF ARTERIOVENOUS FISTULA LEFT POSSIBLE INSERTION OF DIALYSIS CATHETER   May stick graft immediately  X May stick fistula in designated area only: not overlying incision, can stick more proximal forearm  X Do not stick aneurysm repair site  for 6 weeks    If you have any questions, please call the office at [phone].

## 2021-01-08 NOTE — Progress Notes (Signed)
  Progress Note    01/08/2021 12:48 PM Day of Surgery  Devin Bullock underwent revision of left arm AV fistula with plication of pseudoaneurysm today by Dr. Donzetta Matters. He normally dialyzes on MWF at 6 am at the [facility] He initially was going to be kept in observation overnight due to transportation issues and living alone. The plan was to have transportation arranged to get him to his regularly scheduled dialysis session in the morning. He now has transportation home later today and would like to be discharged. He is stable for discharge from a vascular standpoint  Karoline Caldwell, PA-C [facility] [phone] 01/08/2021 12:48 PM

## 2021-01-08 NOTE — Progress Notes (Signed)
Devin Bullock  Code Status: FULL  Devin Bullock is a 55 y.o. male patient admitted from PACU awake, alert - oriented X4 - no acute distress noted. VSS -  no c/o shortness of breath, no c/o chest pain. Orientation to room and floor completed with information packet given to patient. Fall assessment complete. Call light within reach, patient able to voice, and demonstrate understanding. Skin, clean-dry- intact without evidence of bruising, or skin tears.  No evidence of skin break down noted on exam.  ?  Will cont to eval and treat per MD orders.  Melonie Florida, RN  01/08/2021

## 2021-01-08 NOTE — Transfer of Care (Signed)
Immediate Anesthesia Transfer of Care Note  Patient: Devin Bullock  Procedure(s) Performed: REVISON PLICATION OF ARTERIOVENOUS FISTULA LEFT (Left Arm Lower)  Patient Location: PACU  Anesthesia Type:MAC  Level of Consciousness: awake, alert  and oriented  Airway & Oxygen Therapy: Patient Spontanous Breathing and Patient connected to face mask oxygen  Post-op Assessment: Report given to RN, Post -op Vital signs reviewed and stable and Patient moving all extremities X 4  Post vital signs: Reviewed and stable  Last Vitals:  Vitals Value Taken Time  BP 105/67 01/08/21 0829  Temp    Pulse 89 01/08/21 0830  Resp 24 01/08/21 0830  SpO2 100 % 01/08/21 0830  Vitals shown include unvalidated device data.  Last Pain:  Vitals:   01/08/21 0654  TempSrc:   PainSc: 2          Complications: No complications documented.

## 2021-01-09 ENCOUNTER — Encounter (HOSPITAL_COMMUNITY): Payer: Self-pay | Admitting: Vascular Surgery

## 2021-01-09 NOTE — Discharge Summary (Signed)
  Discharge Summary  Patient ID: Devin Bullock [id number] 54 y.o. May 23, 1966  Admit date: 01/08/2021  Discharge date and time: 01/08/2021  2:46 PM   Admitting Physician: Waynetta Sandy, MD   Discharge Physician: Waynetta Sandy, MD  Admission Diagnoses: ESRD (end stage renal disease) on dialysis [facility]) [N18.6, Z99.2]  Discharge Diagnoses: ESRD  Admission Condition: good  Discharged Condition: good  Indication for Admission: 55 year old male with end-stage renal disease on dialysis via left forearm AV fistula.  He has a pseudoaneurysm that has had bleeding from and is scabbed.  He is indicated for revision.  We evaluated in preoperative area and he has enough remaining fistula for cannulation after revision and we are not planning for tunneled dialysis catheter.  Hospital Course: 55 year old male was admitted on 01/08/21 and underwent revision of left arm Av fistula with plication of pseudoaneurysm. He tolerated the procedure well and was transferred to the recovery room in stable condition. He was initially going to be kept in observation overnight due to not having transportation home as well as not having anyone to stay with him post operatively. However he was able to find a fiend who was able to take him home and stay with him over night. He normally dialyzes at the [facility] location on MWF first shift. He wishes to keep his regular schedule and will use [employer] for transportation to his session. He otherwise is stable for discharge home. He will follow up as needed with Vascular and Vein specialists. His fistula can continue to be used more proximally from the area of plication.   Consults: nephrology  Treatments: surgery: Revision of left arm AV fistula with plication of pseudoaneurysm    Disposition: Discharge disposition: 01-Home or Self Care       Patient Instructions:  Allergies as of 01/08/2021   No Known Allergies     Medication List     TAKE these medications   cinacalcet 30 MG tablet Commonly known as: SENSIPAR Take 30 mg by mouth 2 (two) times daily with a meal.   HYDROcodone-acetaminophen 5-325 MG tablet Commonly known as: Norco Take 1 tablet by mouth every 4 (four) hours as needed for moderate pain or severe pain. What changed: reasons to take this   multivitamin Tabs tablet Take 1 tablet by mouth daily.   Renagel 800 MG tablet Generic drug: sevelamer Take 1,600-3,200 mg by mouth See admin instructions. Take 3200 mg with each meal and 1600 mg with each snack      Activity: activity as tolerated and no driving while on analgesics Diet: renal diet Wound Care: keep wound clean and dry and clean daily with mild soap and water. pat dry. do not apply any topical ointments or lotions  Follow-up with [facility]ists as needed  Signed: Karoline Caldwell, PA-C 01/09/2021 11:12 AM [facility] Office: [phone]

## 2021-03-10 ENCOUNTER — Ambulatory Visit: Payer: Medicaid Other

## 2021-03-24 ENCOUNTER — Ambulatory Visit: Payer: Medicaid Other

## 2021-04-09 ENCOUNTER — Ambulatory Visit: Payer: Medicaid Other

## 2021-04-17 ENCOUNTER — Other Ambulatory Visit: Payer: Self-pay | Admitting: Urology

## 2021-05-07 ENCOUNTER — Ambulatory Visit: Payer: Medicaid Other

## 2021-05-13 NOTE — Progress Notes (Addendum)
Patient states he is cancelling this procedure at this time, left a voicemail for [facility] at Dr. Zettie Pho office.

## 2021-05-29 ENCOUNTER — Inpatient Hospital Stay: Admit: 2021-05-29 | Payer: Medicaid Other | Admitting: Urology

## 2021-05-29 SURGERY — NEPHROURETERECTOMY, ROBOT-ASSISTED, LAPAROSCOPIC
Anesthesia: Choice

## 2021-06-11 ENCOUNTER — Ambulatory Visit: Payer: Medicaid Other

## 2021-07-02 DIAGNOSIS — I5020 Unspecified systolic (congestive) heart failure: Secondary | ICD-10-CM | POA: Insufficient documentation

## 2021-07-02 DIAGNOSIS — E010 Iodine-deficiency related diffuse (endemic) goiter: Secondary | ICD-10-CM | POA: Insufficient documentation

## 2021-07-16 ENCOUNTER — Ambulatory Visit: Payer: Medicaid Other

## 2021-07-28 DIAGNOSIS — C679 Malignant neoplasm of bladder, unspecified: Secondary | ICD-10-CM | POA: Insufficient documentation

## 2021-08-20 ENCOUNTER — Ambulatory Visit: Payer: Medicaid Other

## 2021-10-16 ENCOUNTER — Other Ambulatory Visit: Payer: Self-pay

## 2021-10-16 ENCOUNTER — Ambulatory Visit (INDEPENDENT_AMBULATORY_CARE_PROVIDER_SITE_OTHER): Payer: Medicaid Other | Admitting: Physician Assistant

## 2021-10-16 VITALS — BP 105/76 | HR 114 | Temp 97.5°F | Resp 20 | Ht 76.0 in | Wt 306.2 lb

## 2021-10-16 DIAGNOSIS — T829XXA Unspecified complication of cardiac and vascular prosthetic device, implant and graft, initial encounter: Secondary | ICD-10-CM

## 2021-10-16 DIAGNOSIS — Z992 Dependence on renal dialysis: Secondary | ICD-10-CM | POA: Diagnosis not present

## 2021-10-16 DIAGNOSIS — N186 End stage renal disease: Secondary | ICD-10-CM

## 2021-10-16 NOTE — Progress Notes (Signed)
? ? ?  Established Dialysis Access ? ? ?History of Present Illness  ? ?Devin Bullock is a 56 y.o. ([birth year]) male who presents for evaluation of ulcer on Left AV fistula. He reports that his fistula has been working well. No bleeding issues. No signs or symptoms of steal. He has had this ulcer present for a while says sometimes he can peel it off but now it is harder. He has history of prior ulceration in same location requiring plication on 04/13/92 by Dr. Donzetta Matters.   ? ?He dialyzes on Tues/Thurs/ Sat at the [facility] location ? ?The patient's PMH, PSH, SH, and FamHx were reviewed today and are unchanged ? ?Current Outpatient Medications  ?Medication Sig Dispense Refill  ? cinacalcet (SENSIPAR) 30 MG tablet Take 30 mg by mouth 2 (two) times daily with a meal.    ? HYDROcodone-acetaminophen (NORCO) 5-325 MG tablet Take 1 tablet by mouth every 4 (four) hours as needed for moderate pain or severe pain. 8 tablet 0  ? multivitamin (RENA-VIT) TABS tablet Take 1 tablet by mouth daily.    ? RENAGEL 800 MG tablet Take 1,600-3,200 mg by mouth See admin instructions. Take 3200 mg with each meal and 1600 mg with each snack  5  ? ?No current facility-administered medications for this visit.  ? ? ?On ROS today: negative unless stated in HPI ? ? ?Physical Examination  ? ?Vitals:  ? 10/16/21 1404  ?BP: 105/76  ?Pulse: (!) 114  ?Resp: 20  ?Temp: (!) 97.5 ?F (36.4 ?C)  ?TempSrc: Temporal  ?SpO2: 96%  ?Weight: (!) 306 lb 3.2 oz (138.9 kg)  ?Height: 6\' 4"  (1.93 m)  ? ?Body mass index is 37.27 kg/m?. ? ?General Well appearing, well nourished  ?Pulmonary Non labored  ?Cardiac Regular rate and rhythm  ?Vascular 2+ palpable radial pulse. Left RC AV Fistula with good thrill. Not pulsatile. Distal aneurysmal area has small 1 cm ulceration. The skin is not mobile. There is some hypopigmentation present around the ulcer. No active bleeding   ?Musculo- ?skeletal M/S 5/5 throughout  , Extremities without ischemic changes    ?Neurologic A&O; CN  grossly intact  ? ? ? ?Medical Decision Making  ? ?Devin Bullock is a 56 y.o. male who presents with ESRD with left Radiocephalic AV fistula with recurrent ulceration.  ?The fistula is working well. Discussed with patient the he is at risk for his fistula bleeding. Explained that this could be minor oozing,  but also could be very significant even life threatening bleeding. I have recommended another plication of this area. At the time he Previously he had this done and the fistula more proximally was able to be used in the healing interim so he did not have to have [place]. Patient really would like to avoid another surgery. I have therefore recommended that they not stick close to the ulcer. I showed him areas where they could move the stick sites too. I will have him follow up in in 4 weeks to re evaluate. He knows that if he has any bleeding occurrences he needs to follow up sooner or if it is any significant bleeding he needs to go to ER. ? ? ?Karoline Caldwell, PA-C ?[facility] [place] ?Office: [phone] ? ?On call MD: Dr. Virl Cagey  ?

## 2021-11-20 ENCOUNTER — Ambulatory Visit: Payer: Medicaid Other

## 2021-11-26 NOTE — Progress Notes (Unsigned)
?Office Note  ? ? ? ?CC:  follow up ?Requesting Provider:  Mauricia Area, MD ? ?HPI: Devin Bullock is a 56 y.o. ([birth year]) male who presents re evaluation of ulcer on Left AV fistula. He was seen 4 weeks ago. He was not having any issues with the function of the fistula. No bleeding issues. No signs or symptoms of steal. He has had this ulcer present for a while. He explains sometimes he can peel off the scab but now it is harder. He has history of prior ulceration in same location requiring plication on 1/91/47 by Dr. Donzetta Matters. Over the past 4 weeks it has *** ?  ?He dialyzes on Tues/Thurs/ Sat at the [facility] location ?  ?The patient's PMH, PSH, SH, and FamHx were reviewed today and are unchanged ? ?Past Medical History:  ?Diagnosis Date  ? Anemia   ? AV fistula ([place])   ? Cancer [facility])   ? prostate and bladder cancer - 2022 diagnosed  ? CHF (congestive heart failure) ([place])   ? patient denies  ? Chronic kidney disease   ? Dialysis patient [facility])   ? Enlarged heart   ? History of blood transfusion 03/2020  ? Hypertension   ? history of, no longer taking medication at this time  ? Morbid obesity ([place])   ? Parathyroid disorder ([place])   ? ? ?Past Surgical History:  ?Procedure Laterality Date  ? AV FISTULA PLACEMENT Left 05/24/2014  ? Procedure: BRACHIOCEPHALIC ARTERIOVENOUS (AV) FISTULA CREATION;  Surgeon: Angelia Mould, MD;  Location: [place];  Service: Vascular;  Laterality: Left;  ? ESOPHAGOGASTRODUODENOSCOPY (EGD) WITH PROPOFOL N/A 04/03/2020  ? Procedure: ESOPHAGOGASTRODUODENOSCOPY (EGD) WITH PROPOFOL;  Surgeon: Ronnette Juniper, MD;  Location: [place];  Service: Gastroenterology;  Laterality: N/A;  ? PROSTATE BIOPSY N/A 11/27/2020  ? Procedure: BIOPSY TRANSRECTAL ULTRASONIC PROSTATE (TUBP);  Surgeon: Lucas Mallow, MD;  Location: [facility];  Service: Urology;  Laterality: N/A;  REQUESTING 3 MINS FOR CASE  ? REVISON OF ARTERIOVENOUS FISTULA Left 01/08/2021  ? Procedure: REVISON PLICATION OF ARTERIOVENOUS  FISTULA LEFT;  Surgeon: Waynetta Sandy, MD;  Location: [place];  Service: Vascular;  Laterality: Left;  ? TRANSURETHRAL RESECTION OF BLADDER TUMOR N/A 11/27/2020  ? Procedure: TRANSURETHRAL RESECTION OF BLADDER TUMOR (TURBT);  Surgeon: Lucas Mallow, MD;  Location: [facility];  Service: Urology;  Laterality: N/A;  NEED PARALYSIS  ? ? ?Social History  ? ?Socioeconomic History  ? Marital status: Single  ?  Spouse name: Not on file  ? Number of children: 2  ? Years of education: Not on file  ? Highest education level: Not on file  ?Occupational History  ? Not on file  ?Tobacco Use  ? Smoking status: Never  ?  Passive exposure: Never  ? Smokeless tobacco: Never  ?Vaping Use  ? Vaping Use: Never used  ?Substance and Sexual Activity  ? Alcohol use: No  ? Drug use: No  ? Sexual activity: Never  ?Other Topics Concern  ? Not on file  ?Social History Narrative  ? Lives alone.   ? ?Social Determinants of Health  ? ?Financial Resource Strain: Not on file  ?Food Insecurity: Not on file  ?Transportation Needs: Not on file  ?Physical Activity: Not on file  ?Stress: Not on file  ?Social Connections: Not on file  ?Intimate Partner Violence: Not on file  ? ? ?Family History  ?Problem Relation Age of Onset  ? Diabetes Mother   ? Hypertension  Mother   ? Heart attack Mother 46  ? Stroke Father   ? Hypertension Father   ? Depression Father   ? Diabetes Brother   ? Hypertension Brother   ? Stroke Brother 48  ? Alcohol abuse Brother   ? Asthma Brother   ? ? ?Current Outpatient Medications  ?Medication Sig Dispense Refill  ? cinacalcet (SENSIPAR) 30 MG tablet Take 30 mg by mouth 2 (two) times daily with a meal.    ? HYDROcodone-acetaminophen (NORCO) 5-325 MG tablet Take 1 tablet by mouth every 4 (four) hours as needed for moderate pain or severe pain. 8 tablet 0  ? multivitamin (RENA-VIT) TABS tablet Take 1 tablet by mouth daily.    ? RENAGEL 800 MG tablet Take 1,600-3,200 mg by mouth See admin instructions. Take 3200 mg with each  meal and 1600 mg with each snack  5  ? ?No current facility-administered medications for this visit.  ? ? ?No Known Allergies ? ? ?REVIEW OF SYSTEMS:  ?'[X]'$  denotes positive finding, '[ ]'$  denotes negative finding ?Cardiac  Comments:  ?Chest pain or chest pressure:    ?Shortness of breath upon exertion:    ?Short of breath when lying flat:    ?Irregular heart rhythm:    ?    ?Vascular    ?Pain in calf, thigh, or hip brought on by ambulation:    ?Pain in feet at night that wakes you up from your sleep:     ?Blood clot in your veins:    ?Leg swelling:     ?    ?Pulmonary    ?Oxygen at home:    ?Productive cough:     ?Wheezing:     ?    ?Neurologic    ?Sudden weakness in arms or legs:     ?Sudden numbness in arms or legs:     ?Sudden onset of difficulty speaking or slurred speech:    ?Temporary loss of vision in one eye:     ?Problems with dizziness:     ?    ?Gastrointestinal    ?Blood in stool:     ?Vomited blood:     ?    ?Genitourinary    ?Burning when urinating:     ?Blood in urine:    ?    ?Psychiatric    ?Major depression:     ?    ?Hematologic    ?Bleeding problems:    ?Problems with blood clotting too easily:    ?    ?Skin    ?Rashes or ulcers:    ?    ?Constitutional    ?Fever or chills:    ? ? ?PHYSICAL EXAMINATION: ? ?There were no vitals filed for this visit. ? ?General:  WDWN in NAD; vital signs documented above ?Gait: Not observed ?HENT: WNL, normocephalic ?Pulmonary: normal non-labored breathing , without Rales, rhonchi,  wheezing ?Cardiac: {Desc; regular/irreg:14544} HR, without  Murmurs {With/Without:20273} carotid bruit*** ?Abdomen: soft, NT, no masses ?Skin: {With/Without:20273} rashes ?Vascular Exam/Pulses: ? Right Left  ?Radial {Exam; arterial pulse strength 0-4:30167} {Exam; arterial pulse strength 0-4:30167}  ?Ulnar {Exam; arterial pulse strength 0-4:30167} {Exam; arterial pulse strength 0-4:30167}  ?Femoral {Exam; arterial pulse strength 0-4:30167} {Exam; arterial pulse strength 0-4:30167}   ?Popliteal {Exam; arterial pulse strength 0-4:30167} {Exam; arterial pulse strength 0-4:30167}  ?DP {Exam; arterial pulse strength 0-4:30167} {Exam; arterial pulse strength 0-4:30167}  ?PT {Exam; arterial pulse strength 0-4:30167} {Exam; arterial pulse strength 0-4:30167}  ? ?Extremities: {With/Without:20273} ischemic changes, {With/Without:20273} Gangrene , {With/Without:20273}  cellulitis; {With/Without:20273} open wounds;  ?Musculoskeletal: no muscle wasting or atrophy  ?Neurologic: A&O X 3;  No focal weakness or paresthesias are detected ?Psychiatric:  The pt has {Desc; normal/abnormal:11317::"Normal"} affect. ? ? ?Non-Invasive Vascular Imaging:   ?*** ? ? ? ?ASSESSMENT/PLAN:: 56 y.o. male here for follow up for left AV fistula ulceration. ? ? ?-*** ? ? ?Karoline Caldwell, PA-C ?[facility] ?[phone] ? ?Clinic MD:  Dr. Scot Dock ?

## 2021-12-03 ENCOUNTER — Ambulatory Visit: Payer: Medicaid Other | Admitting: Physician Assistant

## 2022-12-21 IMAGING — NM NM BONE WHOLE BODY
4 series · 4 of 4 positions shown · non-contrast
Comparison: None.

CLINICAL DATA: New diagnosis prostate cancer, elevated PSA

EXAM:
NUCLEAR MEDICINE WHOLE BODY BONE SCAN
TECHNIQUE: Whole body anterior and posterior images were obtained approximately
3 hours after intravenous injection of radiopharmaceutical.
RADIOPHARMACEUTICALS:  20.8 mCi 7echnetium-PPm MDP IV

[Series 1: whole body · 2.66mm/px · 1 of 1 slices shown (1 of 2)]
[im 1/1]
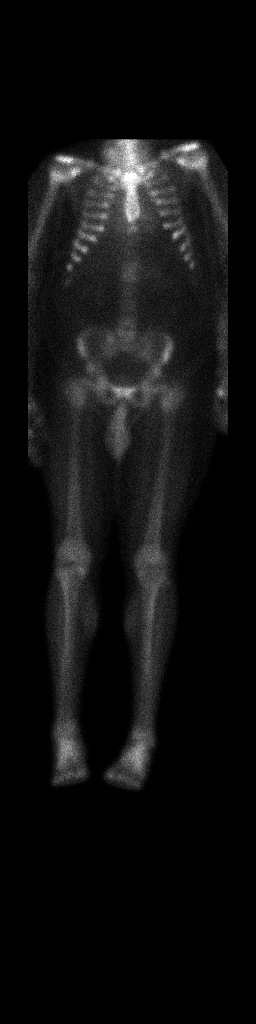

[Series 1: whole body · 2.66mm/px · 1 of 1 slices shown (2 of 2)]
[im 1/1]
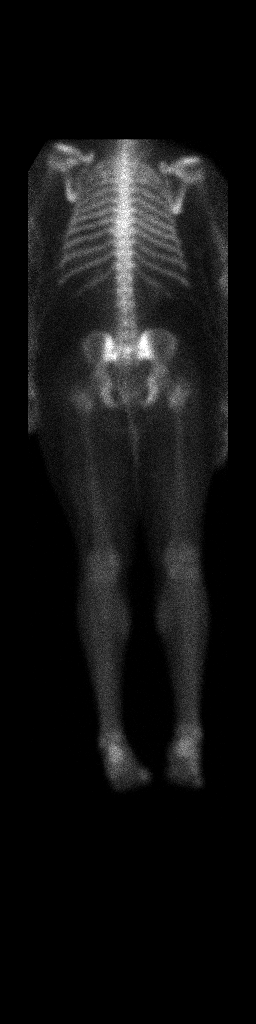

[Series 2: bone statics · 2.07mm/px · 1 of 1 slices shown (1 of 2)]
[im 1/1  full-range]
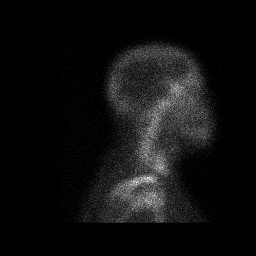

[Series 2: bone statics · 2.07mm/px · 1 of 1 slices shown (2 of 2)]
[im 1/1  full-range]
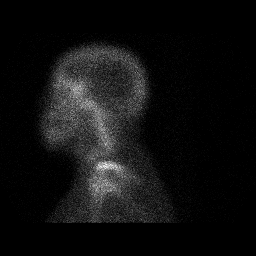

[4 of 4 positions shown; findings below may reference images not displayed]

FINDINGS: No suspicious radiotracer activity in the included skeleton. There
is minimal degenerative activity in the shoulders, sacroiliac
joints, and hindfeet.
IMPRESSION: No radiotracer activity suspicious for osseous metastatic disease on
nuclear scintigraphic whole body bone scan.

## 2023-01-31 DIAGNOSIS — A499 Bacterial infection, unspecified: Secondary | ICD-10-CM | POA: Insufficient documentation

## 2023-03-15 ENCOUNTER — Other Ambulatory Visit: Payer: Self-pay

## 2023-03-15 ENCOUNTER — Observation Stay (HOSPITAL_COMMUNITY)
Admission: EM | Admit: 2023-03-15 | Discharge: 2023-03-16 | Discharge status: Against Medical Advice | Payer: Medicaid Other | Attending: Family Medicine | Admitting: Family Medicine

## 2023-03-15 ENCOUNTER — Encounter (HOSPITAL_COMMUNITY): Payer: Self-pay

## 2023-03-15 DIAGNOSIS — Z5321 Procedure and treatment not carried out due to patient leaving prior to being seen by health care provider: Secondary | ICD-10-CM | POA: Insufficient documentation

## 2023-03-15 DIAGNOSIS — N1330 Unspecified hydronephrosis: Secondary | ICD-10-CM | POA: Diagnosis present

## 2023-03-15 DIAGNOSIS — E875 Hyperkalemia: Secondary | ICD-10-CM | POA: Diagnosis present

## 2023-03-15 DIAGNOSIS — E8770 Fluid overload, unspecified: Secondary | ICD-10-CM | POA: Diagnosis present

## 2023-03-15 DIAGNOSIS — K5730 Diverticulosis of large intestine without perforation or abscess without bleeding: Secondary | ICD-10-CM | POA: Diagnosis not present

## 2023-03-15 DIAGNOSIS — I120 Hypertensive chronic kidney disease with stage 5 chronic kidney disease or end stage renal disease: Principal | ICD-10-CM | POA: Diagnosis present

## 2023-03-15 DIAGNOSIS — Z992 Dependence on renal dialysis: Secondary | ICD-10-CM | POA: Diagnosis not present

## 2023-03-15 DIAGNOSIS — C679 Malignant neoplasm of bladder, unspecified: Secondary | ICD-10-CM | POA: Diagnosis not present

## 2023-03-15 DIAGNOSIS — Z79899 Other long term (current) drug therapy: Secondary | ICD-10-CM | POA: Insufficient documentation

## 2023-03-15 DIAGNOSIS — N186 End stage renal disease: Secondary | ICD-10-CM | POA: Insufficient documentation

## 2023-03-15 DIAGNOSIS — K922 Gastrointestinal hemorrhage, unspecified: Principal | ICD-10-CM | POA: Diagnosis present

## 2023-03-15 DIAGNOSIS — Z91158 Patient's noncompliance with renal dialysis for other reason: Secondary | ICD-10-CM

## 2023-03-15 DIAGNOSIS — N2581 Secondary hyperparathyroidism of renal origin: Secondary | ICD-10-CM | POA: Diagnosis not present

## 2023-03-15 DIAGNOSIS — R58 Hemorrhage, not elsewhere classified: Secondary | ICD-10-CM | POA: Diagnosis present

## 2023-03-15 DIAGNOSIS — K921 Melena: Secondary | ICD-10-CM | POA: Diagnosis present

## 2023-03-15 DIAGNOSIS — Z8249 Family history of ischemic heart disease and other diseases of the circulatory system: Secondary | ICD-10-CM

## 2023-03-15 DIAGNOSIS — I160 Hypertensive urgency: Secondary | ICD-10-CM | POA: Diagnosis present

## 2023-03-15 DIAGNOSIS — C61 Malignant neoplasm of prostate: Secondary | ICD-10-CM | POA: Diagnosis not present

## 2023-03-15 DIAGNOSIS — D631 Anemia in chronic kidney disease: Secondary | ICD-10-CM | POA: Diagnosis not present

## 2023-03-15 DIAGNOSIS — R34 Anuria and oliguria: Secondary | ICD-10-CM | POA: Diagnosis present

## 2023-03-15 NOTE — ED Notes (Signed)
Patient states his last "good session" of dialysis was Friday.

## 2023-03-15 NOTE — ED Provider Notes (Signed)
  [place] EMERGENCY DEPARTMENT AT [facility] Provider Note   CSN: [id number] Arrival date & time: 03/15/23  2306     History {Add pertinent medical, surgical, social history, OB history to HPI:1} Chief Complaint  Patient presents with   GI Bleeding    Devin Bullock is a 57 y.o. male.  The history is provided by the patient.   Devin Bullock is a 57 y.o. male who presents to the Emergency Department complaining of *** Full of fluids Mon 30 min at dialsysis - abdominal cramping and vomirting - clear/oj.  Gets heparin with dialysis Last full session fri Has penile bleeding for 1-2 month. Does not make urine. Has 2-3 drops 2-3 times daily On chemo due for cystoscopy yesterday at 330 Had blood in his underware this afternoon.  Blood clots.  Has occ blood in stool for one month.  No fever.  Has sob.      Home Medications Prior to Admission medications   Medication Sig Start Date End Date Taking? Authorizing Provider  cinacalcet (SENSIPAR) 30 MG tablet Take 30 mg by mouth 2 (two) times daily with a meal.    [provider]  HYDROcodone-acetaminophen (NORCO) 5-325 MG tablet Take 1 tablet by mouth every 4 (four) hours as needed for moderate pain or severe pain. 01/08/21   Baglia, Corrina, PA-C  multivitamin (RENA-VIT) TABS tablet Take 1 tablet by mouth daily.    [provider]  RENAGEL 800 MG tablet Take 1,600-3,200 mg by mouth See admin instructions. Take 3200 mg with each meal and 1600 mg with each snack 02/01/18   [provider]      Allergies    Patient has no known allergies.    Review of Systems   Review of Systems  Physical Exam Updated Vital Signs BP (!) 157/109 (BP Location: Right Arm)   Pulse (!) 103   Temp 98.2 F (36.8 C) (Oral)   Resp 18   Ht 6\' 4"  (1.93 m)   Wt 136.1 kg   SpO2 98%   BMI 36.52 kg/m  Physical Exam  ED Results / Procedures / Treatments   Labs (all labs ordered are listed, but only abnormal results are  displayed) Labs Reviewed - No data to display  EKG None  Radiology No results found.  Procedures Procedures  {Document cardiac monitor, telemetry assessment procedure when appropriate:1}  Medications Ordered in ED Medications - No data to display  ED Course/ Medical Decision Making/ A&P   {   Click here for ABCD2, HEART and other calculatorsREFRESH Note before signing :1}                              Medical Decision Making  ***  {Document critical care time when appropriate:1} {Document review of labs and clinical decision tools ie heart score, Chads2Vasc2 etc:1}  {Document your independent review of radiology images, and any outside records:1} {Document your discussion with family members, caretakers, and with consultants:1} {Document social determinants of health affecting pt's care:1} {Document your decision making why or why not admission, treatments were needed:1} Final Clinical Impression(s) / ED Diagnoses Final diagnoses:  None    Rx / DC Orders ED Discharge Orders     None

## 2023-03-15 NOTE — ED Triage Notes (Signed)
Patient BIB from [facility] due to GI bleeding. Patient reports blood in stool x 1 month, total was clots. Patient also reports blood urine, was supposed to be seen for such but could not due to getting sick at dialysis. Patient had dialysis yesterday. Patient has bladder and prostate cancer. Patient reporting throwing up during dialysis. Denies N/V/D today. Patient A&Ox4.

## 2023-03-16 ENCOUNTER — Emergency Department (HOSPITAL_COMMUNITY): Payer: Medicaid Other

## 2023-03-16 ENCOUNTER — Inpatient Hospital Stay (HOSPITAL_COMMUNITY): Payer: Medicaid Other

## 2023-03-16 ENCOUNTER — Encounter (HOSPITAL_COMMUNITY): Payer: Self-pay | Admitting: Family Medicine

## 2023-03-16 DIAGNOSIS — N186 End stage renal disease: Secondary | ICD-10-CM

## 2023-03-16 DIAGNOSIS — E8779 Other fluid overload: Secondary | ICD-10-CM | POA: Diagnosis not present

## 2023-03-16 DIAGNOSIS — E875 Hyperkalemia: Secondary | ICD-10-CM | POA: Diagnosis present

## 2023-03-16 DIAGNOSIS — K922 Gastrointestinal hemorrhage, unspecified: Secondary | ICD-10-CM | POA: Diagnosis not present

## 2023-03-16 DIAGNOSIS — C678 Malignant neoplasm of overlapping sites of bladder: Secondary | ICD-10-CM

## 2023-03-16 DIAGNOSIS — Z992 Dependence on renal dialysis: Secondary | ICD-10-CM

## 2023-03-16 DIAGNOSIS — C61 Malignant neoplasm of prostate: Secondary | ICD-10-CM

## 2023-03-16 LAB — CBC
HCT: 34.9 % — ABNORMAL LOW (ref 39.0–52.0)
HCT: 37.1 % — ABNORMAL LOW (ref 39.0–52.0)
HCT: 33.7 % — ABNORMAL LOW (ref 39.0–52.0)
Hemoglobin: 11.1 g/dL — ABNORMAL LOW (ref 13.0–17.0)
Hemoglobin: 11.9 g/dL — ABNORMAL LOW (ref 13.0–17.0)
Hemoglobin: 10.7 g/dL — ABNORMAL LOW (ref 13.0–17.0)
MCH: 30.3 pg (ref 26.0–34.0)
MCH: 30.6 pg (ref 26.0–34.0)
MCH: 30.3 pg (ref 26.0–34.0)
MCHC: 31.8 g/dL (ref 30.0–36.0)
MCHC: 32.1 g/dL (ref 30.0–36.0)
MCHC: 31.8 g/dL (ref 30.0–36.0)
MCV: 95.4 fL (ref 80.0–100.0)
MCV: 95.4 fL (ref 80.0–100.0)
MCV: 95.5 fL (ref 80.0–100.0)
Platelets: 164 10*3/uL (ref 150–400)
Platelets: 201 10*3/uL (ref 150–400)
Platelets: 164 10*3/uL (ref 150–400)
RBC: 3.66 MIL/uL — ABNORMAL LOW (ref 4.22–5.81)
RBC: 3.89 MIL/uL — ABNORMAL LOW (ref 4.22–5.81)
RBC: 3.53 MIL/uL — ABNORMAL LOW (ref 4.22–5.81)
RDW: 15.4 % (ref 11.5–15.5)
RDW: 15.5 % (ref 11.5–15.5)
RDW: 15.6 % — ABNORMAL HIGH (ref 11.5–15.5)
WBC: 7.8 10*3/uL (ref 4.0–10.5)
WBC: 6.1 10*3/uL (ref 4.0–10.5)
WBC: 5.4 10*3/uL (ref 4.0–10.5)
nRBC: 0 % (ref 0.0–0.2)
nRBC: 0 % (ref 0.0–0.2)
nRBC: 0 % (ref 0.0–0.2)

## 2023-03-16 LAB — PHOSPHORUS: Phosphorus: 3.5 mg/dL (ref 2.5–4.6)

## 2023-03-16 LAB — BASIC METABOLIC PANEL
Anion gap: 19 — ABNORMAL HIGH (ref 5–15)
BUN: 85 mg/dL — ABNORMAL HIGH (ref 6–20)
CO2: 24 mmol/L (ref 22–32)
Calcium: 10 mg/dL (ref 8.9–10.3)
Chloride: 93 mmol/L — ABNORMAL LOW (ref 98–111)
Creatinine, Ser: 14.42 mg/dL — ABNORMAL HIGH (ref 0.61–1.24)
GFR, Estimated: 4 mL/min — ABNORMAL LOW (ref >60)
Glucose, Bld: 98 mg/dL (ref 70–99)
Potassium: 5.9 mmol/L — ABNORMAL HIGH (ref 3.5–5.1)
Sodium: 136 mmol/L (ref 135–145)

## 2023-03-16 LAB — HIV ANTIBODY (ROUTINE TESTING W REFLEX): HIV Screen 4th Generation wRfx: NONREACTIVE

## 2023-03-16 LAB — I-STAT CG4 LACTIC ACID, ED: Lactic Acid, Venous: 1.2 mmol/L (ref 0.5–1.9)

## 2023-03-16 LAB — HEPATITIS B SURFACE ANTIGEN: Hepatitis B Surface Ag: NONREACTIVE

## 2023-03-16 MED ORDER — ACETAMINOPHEN 325 MG PO TABS: 650.0000 mg | ORAL_TABLET | Freq: Four times a day (QID) | ORAL | Status: DC | PRN

## 2023-03-16 MED ORDER — SODIUM CHLORIDE 0.9% FLUSH: 3.0000 mL | INTRAVENOUS | Status: DC | PRN

## 2023-03-16 MED ORDER — HYDRALAZINE HCL 20 MG/ML IJ SOLN: 10.0000 mg | Freq: Four times a day (QID) | INTRAMUSCULAR | Status: DC | PRN

## 2023-03-16 MED ORDER — SODIUM CHLORIDE 0.9% FLUSH
3.0000 mL | Freq: Two times a day (BID) | INTRAVENOUS | Status: DC
  Administered 2023-03-16: 3 mL via INTRAVENOUS

## 2023-03-16 MED ORDER — SODIUM CHLORIDE 0.9 % IV SOLN: 250.0000 mL | INTRAVENOUS | Status: DC | PRN

## 2023-03-16 MED ORDER — ONDANSETRON HCL 4 MG/2ML IJ SOLN: 4.0000 mg | Freq: Four times a day (QID) | INTRAMUSCULAR | Status: DC | PRN

## 2023-03-16 MED ORDER — SODIUM CHLORIDE 0.9% FLUSH: 3.0000 mL | Freq: Two times a day (BID) | INTRAVENOUS | Status: DC

## 2023-03-16 MED ORDER — SODIUM ZIRCONIUM CYCLOSILICATE 10 G PO PACK
10.0000 g | PACK | Freq: Once | ORAL | Status: AC
  Administered 2023-03-16: 10 g via ORAL
  Filled 2023-03-16: qty 1

## 2023-03-16 MED ORDER — ACETAMINOPHEN 650 MG RE SUPP: 650.0000 mg | Freq: Four times a day (QID) | RECTAL | Status: DC | PRN

## 2023-03-16 MED ORDER — ONDANSETRON HCL 4 MG PO TABS: 4.0000 mg | ORAL_TABLET | Freq: Four times a day (QID) | ORAL | Status: DC | PRN

## 2023-03-16 MED ORDER — FENTANYL CITRATE PF 50 MCG/ML IJ SOSY: 12.5000 ug | PREFILLED_SYRINGE | INTRAMUSCULAR | Status: DC | PRN

## 2023-03-16 NOTE — Consult Note (Signed)
Referring Provider: Dr. Antionette Char Primary Care Physician:  [name], MD Primary Gastroenterologist:  Gentry Fitz  Reason for Consultation:  GI bleed  HPI: Devin Bullock is a 57 y.o. male with past medical history significant for end-stage renal disease on hemodialysis, prostate cancer, bladder cancer presented to the emergency department for evaluation of rectal bleeding.  He tells me that he seen some small amounts of blood mixed in with the stool intermittently for the past month or so, but yesterday he started passing frank blood with clots.  He said he felt like he had about have a bowel movement and just passed all this blood with clots x 2.  He said that it even messed up his underwear.  He does have some lower abdominal discomfort.  He did not feel well at dialysis yesterday so they stopped that session after only 30 minutes.  He also had some nausea and vomiting, but denies seeing any blood in his emesis.  He is anuric for years now as he has been on dialysis for about 9 years, but he does report having some drops of blood from his urethra, and his urologist is aware of this.  Hemoglobin stable at 11.1 g.   CT scan abdomen and pelvis without contrast:  IMPRESSION: 1. Mild hydroureteronephrosis on the right without evidence of obstructing stone. Hyperdense material is present in the collecting system on the right and urinary bladder, suggesting blood products. 2. Decompressed gallbladder with wall thickening, possible infectious or inflammatory cystitis. 3. Nonobstructive left renal calculi. 4. Diverticulosis without diverticulitis. 5. Bilateral renal atrophy and cysts. Additional hyperdense lesions are present in the kidneys bilaterally. Ultrasound is suggested for further characterization on nonemergent follow-up. 6. Aortic atherosclerosis.  RUQ ultrasound pending.  EGD 03/2020 by Dr. Marca Ancona with [facility] that was performed as inpatient showed a gastric fundic lipoma and thematous  duodenopathy, no specimens collected.  He says that he is starving as he has not had anything to eat since a small bowl of oatmeal yesterday morning.  Has never had a colonoscopy in the past.    Past Medical History:  Diagnosis Date   Anemia    AV fistula (HCC)    Cancer (HCC)    prostate and bladder cancer - 2022 diagnosed   CHF (congestive heart failure) (HCC)    patient denies   Chronic kidney disease    Dialysis patient [facility])    Enlarged heart    History of blood transfusion 03/2020   Hypertension    history of, no longer taking medication at this time   Morbid obesity (HCC)    Parathyroid disorder [facility])     Past Surgical History:  Procedure Laterality Date   AV FISTULA PLACEMENT Left 05/24/2014   Procedure: BRACHIOCEPHALIC ARTERIOVENOUS (AV) FISTULA CREATION;  Surgeon: Chuck Hint, MD;  [facility];  Service: Vascular;  Laterality: Left;   ESOPHAGOGASTRODUODENOSCOPY (EGD) WITH PROPOFOL N/A 04/03/2020   Procedure: ESOPHAGOGASTRODUODENOSCOPY (EGD) WITH PROPOFOL;  Surgeon: Kerin Salen, MD;  Location: [facility] ENDOSCOPY;  Service: Gastroenterology;  Laterality: N/A;   PROSTATE BIOPSY N/A 11/27/2020   Procedure: BIOPSY TRANSRECTAL ULTRASONIC PROSTATE (TUBP);  Surgeon: Crista Elliot, MD;  Location: [facility] ORS;  Service: Urology;  Laterality: N/A;  REQUESTING 9 MINS FOR CASE   REVISON OF ARTERIOVENOUS FISTULA Left 01/08/2021   Procedure: REVISON PLICATION OF ARTERIOVENOUS FISTULA LEFT;  Surgeon: Maeola Harman, MD;  Location[facility] OR;  Service: Vascular;  Laterality: Left;   TRANSURETHRAL RESECTION OF BLADDER TUMOR N/A  11/27/2020   Procedure: TRANSURETHRAL RESECTION OF BLADDER TUMOR (TURBT);  Surgeon: Crista Elliot, MD;  Location: [facility] ORS;  Service: Urology;  Laterality: N/A;  NEED PARALYSIS    Prior to Admission medications   Medication Sig Start Date End Date Taking? Authorizing Provider  cinacalcet (SENSIPAR) 60 MG tablet Take 60 mg by mouth daily.   Yes  [provider]  RENAGEL 800 MG tablet Take 1,600-3,200 mg by mouth See admin instructions. Take 3200 mg with each meal and 1600 mg with each snack 02/01/18  Yes [provider]  sucroferric oxyhydroxide (VELPHORO) 500 MG chewable tablet Chew 1,000-2,000 mg by mouth 3 (three) times daily with meals.   Yes [provider]    Current Facility-Administered Medications  Medication Dose Route Frequency Provider Last Rate Last Admin   0.9 %  sodium chloride infusion  250 mL Intravenous PRN Opyd, Lavone Neri, MD       acetaminophen (TYLENOL) tablet 650 mg  650 mg Oral Q6H PRN Opyd, Lavone Neri, MD       Or   acetaminophen (TYLENOL) suppository 650 mg  650 mg Rectal Q6H PRN Opyd, Lavone Neri, MD       fentaNYL (SUBLIMAZE) injection 12.5-50 mcg  12.5-50 mcg Intravenous Q2H PRN Opyd, Lavone Neri, MD       hydrALAZINE (APRESOLINE) injection 10 mg  10 mg Intravenous Q6H PRN Hughie Closs, MD       ondansetron (ZOFRAN) tablet 4 mg  4 mg Oral Q6H PRN Opyd, Lavone Neri, MD       Or   ondansetron (ZOFRAN) injection 4 mg  4 mg Intravenous Q6H PRN Opyd, Lavone Neri, MD       sodium chloride flush (NS) 0.9 % injection 3 mL  3 mL Intravenous Q12H Opyd, Lavone Neri, MD       sodium chloride flush (NS) 0.9 % injection 3 mL  3 mL Intravenous Q12H Opyd, Lavone Neri, MD   3 mL at 03/16/23 0333   sodium chloride flush (NS) 0.9 % injection 3 mL  3 mL Intravenous PRN Opyd, Lavone Neri, MD       Current Outpatient Medications  Medication Sig Dispense Refill   cinacalcet (SENSIPAR) 60 MG tablet Take 60 mg by mouth daily.     RENAGEL 800 MG tablet Take 1,600-3,200 mg by mouth See admin instructions. Take 3200 mg with each meal and 1600 mg with each snack  5   sucroferric oxyhydroxide (VELPHORO) 500 MG chewable tablet Chew 1,000-2,000 mg by mouth 3 (three) times daily with meals.      Allergies as of 03/15/2023   (No Known Allergies)    Family History  Problem Relation Age of Onset   Diabetes Mother     Hypertension Mother    Heart attack Mother 62   Stroke Father    Hypertension Father    Depression Father    Diabetes Brother    Hypertension Brother    Stroke Brother 12   Alcohol abuse Brother    Asthma Brother     Social History   Socioeconomic History   Marital status: Single    Spouse name: Not on file   Number of children: 2   Years of education: Not on file   Highest education level: Not on file  Occupational History   Not on file  Tobacco Use   Smoking status: Never    Passive exposure: Never   Smokeless tobacco: Never  Vaping Use   Vaping status:  Never Used  Substance and Sexual Activity   Alcohol use: No   Drug use: No   Sexual activity: Never  Other Topics Concern   Not on file  Social History Narrative   Lives alone.    Social Determinants of Health   Financial Resource Strain: Not on file  Food Insecurity: No Food Insecurity (07/28/2021)   Received from [facility] visits prior to 10/16/2022., [facility] visits prior to 10/16/2022.   Hunger Vital Sign    Worried About Programme researcher, broadcasting/film/video in the Last Year: Never true    Ran Out of Food in the Last Year: Never true  Transportation Needs: Not on file  Physical Activity: Not on file  Stress: Not on file  Social Connections: Not on file  Intimate Partner Violence: Not on file    Review of Systems: ROS is O/W negative except as mentioned in HPI.  Physical Exam: Vital signs in last 24 hours: Temp:  [98.2 F (36.8 C)-98.4 F (36.9 C)] 98.2 F (36.8 C) (07/31 0630) Pulse Rate:  [90-103] 98 (07/31 0850) Resp:  [11-27] 11 (07/31 0850) BP: (151-168)/(91-113) 156/113 (07/31 0850) SpO2:  [94 %-100 %] 95 % (07/31 0850) Weight:  [136.1 kg] 136.1 kg (07/30 2318)   General:  Alert, Well-developed, well-nourished, pleasant and cooperative in NAD Head:  Normocephalic and atraumatic. Eyes:  Sclera clear, no icterus.  Conjunctiva pink. Ears:  Normal auditory  acuity. Mouth:  No deformity or lesions.   Lungs:  Clear throughout to auscultation.  No wheezes, crackles, or rhonchi.  Heart:  Regular rate and rhythm; no murmurs, clicks, rubs, or gallops. Abdomen:  Soft, non-distended.  BS present.  Some lower abdominal TTP.   Rectal:  Deferred.  Frank blood on DRE per EDP.  Msk:  Symmetrical without gross deformities. Pulses:  Normal pulses noted. Extremities:  Without clubbing or edema. Neurologic:  Alert and oriented x 4;  grossly normal neurologically. Skin:  Intact without significant lesions or rashes. Psych:  Alert and cooperative. Normal mood and affect.  Lab Results: Recent Labs    03/16/23 0000 03/16/23 0334  WBC 8.3 7.8  HGB 11.6* 11.1*  HCT 36.8* 34.9*  PLT 188 164   BMET Recent Labs    03/16/23 0000 03/16/23 0334  NA 134* 136  K 5.6* 5.9*  CL 95* 93*  CO2 22 24  GLUCOSE 97 98  BUN 82* 85*  CREATININE 13.97* 14.42*  CALCIUM 10.0 10.0   LFT Recent Labs    03/16/23 0000  PROT 7.1  ALBUMIN 3.6  AST 21  ALT 16  ALKPHOS 80  BILITOT 0.8   PT/INR Recent Labs    03/16/23 0000  LABPROT 16.1*  INR 1.3*   Studies/Results: CT ABDOMEN PELVIS WO CONTRAST  Result Date: 03/16/2023 CLINICAL DATA:  Right-sided abdominal pain, GI bleed and hematuria. EXAM: CT ABDOMEN AND PELVIS WITHOUT CONTRAST TECHNIQUE: Multidetector CT imaging of the abdomen and pelvis was performed following the standard protocol without IV contrast. RADIATION DOSE REDUCTION: This exam was performed according to the departmental dose-optimization program which includes automated exposure control, adjustment of the mA and/or kV according to patient size and/or use of iterative reconstruction technique. COMPARISON:  10/21/2020. FINDINGS: Lower chest: Heart is mildly enlarged and there is a small pericardial effusion. Mild atelectasis is noted at the left lung base. Hepatobiliary: Cysts are noted in the liver, additional subcentimeter hypodensities are present  which are too small to further characterize. The gallbladder  is without stones. No biliary ductal dilatation. Pancreas: Unremarkable. No pancreatic ductal dilatation or surrounding inflammatory changes. Spleen: Normal in size without focal abnormality. Adrenals/Urinary Tract: The adrenal glands are within normal limits. Renal atrophy is noted bilaterally. Cysts are noted in the kidneys bilaterally. Additional hyperdense foci are present in the kidneys which can not be further characterize on this single-phase exam. Renal calculi are noted on the left. There is mild hydroureteronephrosis on the right with hyperdense attenuation in the collecting system, possible blood products. Perinephric and periureteral fat stranding are noted on the right. No obstructive uropathy on the left. There is diffuse bladder wall thickening with perivesicular fat stranding. Hyperdense material is noted in the bladder, suggesting blood products. Stomach/Bowel: A 1.1 cm fat attenuation lesion is noted in the proximal stomach suggesting lipoma. No bowel obstruction, free air, or pneumatosis. Normal appendix is seen in the right lower quadrant. Scattered diverticula are present along the colon without evidence of diverticulitis. No focal bowel wall thickening or hemorrhage is seen. Vascular/Lymphatic: Aortic atherosclerosis. No enlarged abdominal or pelvic lymph nodes. Reproductive: Prostate is unremarkable. Other: No abdominopelvic ascites. Fat containing inguinal hernias are present bilaterally. Fat containing umbilical hernia is present. Musculoskeletal: No acute osseous abnormality. IMPRESSION: 1. Mild hydroureteronephrosis on the right without evidence of obstructing stone. Hyperdense material is present in the collecting system on the right and urinary bladder, suggesting blood products. 2. Decompressed gallbladder with wall thickening, possible infectious or inflammatory cystitis. 3. Nonobstructive left renal calculi. 4.  Diverticulosis without diverticulitis. 5. Bilateral renal atrophy and cysts. Additional hyperdense lesions are present in the kidneys bilaterally. Ultrasound is suggested for further characterization on nonemergent follow-up. 6. Aortic atherosclerosis. Electronically Signed   By: Thornell Sartorius M.D.   On: 03/16/2023 02:21   DG Chest Port 1 View  Result Date: 03/16/2023 CLINICAL DATA:  Shortness of breath. EXAM: PORTABLE CHEST 1 VIEW COMPARISON:  April 02, 2020 FINDINGS: There is stable moderate severity cardiac silhouette enlargement. Low lung volumes are seen. Mild atelectasis and/or infiltrate is seen within the bilateral lung bases. No pleural effusion or pneumothorax is identified. The visualized skeletal structures are unremarkable. IMPRESSION: Stable cardiomegaly with low lung volumes with mild bibasilar atelectasis and/or infiltrate. Electronically Signed   By: Aram Candela M.D.   On: 03/16/2023 00:28    IMPRESSION:  *GI bleed, suspect lower with description of red blood mixed with stool and frank blood with clots yesterday.  Frank blood on DRE by the EDP.  Hemoglobin 11.1 g, which is stable from his baseline.  Diverticulosis noted on noncontrast CT without evidence of active hemorrhage.  Question diverticular versus hemorrhoidal versus other. *End-stage renal disease on HD *Prostate cancer and bladder cancer status post radiation therapy and undergoing treatment at [facility] *Ultrasound suggesting possible cholecystitis, but LFTs normal.  Ultrasound pending.  No complaints of right upper quadrant abdominal pain  PLAN: -Patient would like to eat a meal today as he has not had any food since small breakfast yesterday morning.  Will allow him to eat earlier in the day today and then starting at dinnertime will plan for clear liquids.  Will plan for colonoscopy prep tomorrow and colonoscopy on Friday 8/2. -Monitor Hgb.  Princella Pellegrini. Kery Haltiwanger  03/16/2023, 9:31 AM

## 2023-03-16 NOTE — Progress Notes (Addendum)
This is a pleasant 57 year old gentleman with several comorbidities including ESRD on HD on MWF schedule, prostate cancer and bladder cancer s/p radiation therapy who was admitted after midnight under hospitalist service mainly because of rectal bleeding.  This has been ongoing for last few days.  Upon arrival, patient hemodynamically stable with elevated blood pressure meeting criteria for hypertensive urgency.  Patient tells me that he does not have any history of hypertension and that his blood pressure is elevated because he is fluid overloaded.  He received only 30 minutes of dialysis on Monday and this had to be aborted since he was nauseous and started having abdominal pain.  Patient's hemoglobin upon arrival was stable as well.  Patient seen and examined in the ED.  Complains of lower abdominal pain, intermittent for last few days, getting worse when he tries to sit up.  Rectal bleeding for few days as well but worse yesterday so he came to the ED.  Denies any shortness of breath but he feels that he is fluid overloaded.  GI has been consulted and he is likely going to require colonoscopy.  Monitor H&H every 12 hours.  He has not had any further rectal bleeding since presentation to the ED.  I have also consulted nephrology/Dr. Glenna Fellows as he may need dialysis today.  Of note, patient has also been having penile bleeding for about a month and he sees urology at [facility] and had an appointment for cystoscopy yesterday which he missed due to not feeling well.  He tells me that they have rescheduled him for August 29.  He also clarified that his urologist is aware that he has been having penile bleeding for a month.  I advised him to call his urologist to see if they can reschedule him sooner, likely next week if possible.  Lets deal with rectal bleeding today.  Reassess penile bleeding tomorrow.  It would really be ideal that he follows up with his urologist for cystoscopy but if penile bleeding becomes  problematic and he is not rescheduled for cystoscopy sooner than August 29, then we can consider consulting urology here.  He also had hyperkalemia for which he was given Lokelma.  Further management deferred to nephrology.  Also though he does not have any history of hypertension but he meets criteria for hypertensive urgency for which I will start him on as needed hydralazine for now as his blood pressure may be elevated due to missing dialysis as well.  Also, CT abdomen is concerning for possible acute cholecystitis so I will proceed with ultrasound abdomen.  Also shows mild right hydroureteronephrosis without evidence of obstructing stone and possibility of some blood clots and additional hypodense lesions present in bilateral kidneys.  Will proceed with ultrasound renal.

## 2023-03-16 NOTE — Progress Notes (Signed)
Pt arrived to room 5M08 via stretcher from dialysis. Pt ambulated from stretcher to bed. Alert and oriented X4. VSS. Telemetry applied.

## 2023-03-16 NOTE — Consult Note (Addendum)
[facility] Renal Consultation Note  Indication for Consultation:  Management of ESRD/hemodialysis; anemia, hypertension/volume and secondary hyperparathyroidism  HPI: Devin Bullock is a 57 y.o. male with history of ESRD chronic dialysis MWF, other medical history as listed below (compliant with dialysis), last treatment 7/29  off early(1 hour 25 minutes) secondary to abdominal pain.  Reports continued abdominal discomfort passing blood in stool , frank blood yesterday with clots, some shortness of breath with exertion( he states  Because of short dialysis Monday and excess fluid ).  Came to the ER last night for evaluation.  Noted Frank blood on DRE by the EDP.    Seen in ER not in distress, denies fever or chills, reports little p.o. intake.  Denies chest pain only has shortness of breath with exertion and reports that is why bp up . No bp med's at home .  Currently  lab = HGB 11.1 , WBC 7.8, platelets 164  K 5.9 BUN 8 CR 2.42 Ca 10.0 albumin 3.6      Past Medical History:  Diagnosis Date   Anemia    AV fistula (HCC)    Cancer (HCC)    prostate and bladder cancer - 2022 diagnosed   CHF (congestive heart failure) (HCC)    patient denies   Chronic kidney disease    Dialysis patient [facility])    Enlarged heart    History of blood transfusion 03/2020   Hypertension    history of, no longer taking medication at this time   Morbid obesity (HCC)    Parathyroid disorder [facility])     Past Surgical History:  Procedure Laterality Date   AV FISTULA PLACEMENT Left 05/24/2014   Procedure: BRACHIOCEPHALIC ARTERIOVENOUS (AV) FISTULA CREATION;  Surgeon: Chuck Hint, MD;  Location[facility] OR;  Service: Vascular;  Laterality: Left;   ESOPHAGOGASTRODUODENOSCOPY (EGD) WITH PROPOFOL N/A 04/03/2020   Procedure: ESOPHAGOGASTRODUODENOSCOPY (EGD) WITH PROPOFOL;  Surgeon: Kerin Salen, MD;  Location: [facility] ENDOSCOPY;  Service: Gastroenterology;  Laterality: N/A;   PROSTATE BIOPSY N/A 11/27/2020    Procedure: BIOPSY TRANSRECTAL ULTRASONIC PROSTATE (TUBP);  Surgeon: Crista Elliot, MD;  Location: [facility] ORS;  Service: Urology;  Laterality: N/A;  REQUESTING 5 MINS FOR CASE   REVISON OF ARTERIOVENOUS FISTULA Left 01/08/2021   Procedure: REVISON PLICATION OF ARTERIOVENOUS FISTULA LEFT;  Surgeon: Maeola Harman, MD;  Location: [facility] OR;  Service: Vascular;  Laterality: Left;   TRANSURETHRAL RESECTION OF BLADDER TUMOR N/A 11/27/2020   Procedure: TRANSURETHRAL RESECTION OF BLADDER TUMOR (TURBT);  Surgeon: Crista Elliot, MD;  Location: [facility] ORS;  Service: Urology;  Laterality: N/A;  NEED PARALYSIS      Family History  Problem Relation Age of Onset   Diabetes Mother    Hypertension Mother    Heart attack Mother 11   Stroke Father    Hypertension Father    Depression Father    Diabetes Brother    Hypertension Brother    Stroke Brother 18   Alcohol abuse Brother    Asthma Brother       reports that he has never smoked. He has never been exposed to tobacco smoke. He has never used smokeless tobacco. He reports that he does not drink alcohol and does not use drugs.  No Known Allergies  Prior to Admission medications   Medication Sig Start Date End Date Taking? Authorizing Provider  cinacalcet (SENSIPAR) 60 MG tablet Take 60 mg by mouth daily.   Yes [provider]  RENAGEL  800 MG tablet Take 1,600-3,200 mg by mouth See admin instructions. Take 3200 mg with each meal and 1600 mg with each snack 02/01/18  Yes [provider]  sucroferric oxyhydroxide (VELPHORO) 500 MG chewable tablet Chew 1,000-2,000 mg by mouth 3 (three) times daily with meals.   Yes [provider]      Results for orders placed or performed during the hospital encounter of 03/15/23 (from the past 48 hour(s))  Comprehensive metabolic panel     Status: Abnormal   Collection Time: 03/16/23 12:00 AM  Result Value Ref Range   Sodium 134 (L) 135 - 145 mmol/L   Potassium 5.6 (H) 3.5 -  5.1 mmol/L   Chloride 95 (L) 98 - 111 mmol/L   CO2 22 22 - 32 mmol/L   Glucose, Bld 97 70 - 99 mg/dL    Comment: Glucose reference range applies only to samples taken after fasting for at least 8 hours.   BUN 82 (H) 6 - 20 mg/dL   Creatinine, Ser 62.13 (H) 0.61 - 1.24 mg/dL   Calcium 08.6 8.9 - 57.8 mg/dL   Total Protein 7.1 6.5 - 8.1 g/dL   Albumin 3.6 3.5 - 5.0 g/dL   AST 21 15 - 41 U/L   ALT 16 0 - 44 U/L   Alkaline Phosphatase 80 38 - 126 U/L   Total Bilirubin 0.8 0.3 - 1.2 mg/dL   GFR, Estimated 4 (L) >60 mL/min    Comment: (NOTE) Calculated using the CKD-EPI Creatinine Equation (2021)    Anion gap 17 (H) 5 - 15    Comment: Performed at [facility] Lab, [address] [place], [place] [address]  CBC with Differential     Status: Abnormal   Collection Time: 03/16/23 12:00 AM  Result Value Ref Range   WBC 8.3 4.0 - 10.5 K/uL   RBC 3.83 (L) 4.22 - 5.81 MIL/uL   Hemoglobin 11.6 (L) 13.0 - 17.0 g/dL   HCT 95.2 (L) 84.1 - 32.4 %   MCV 96.1 80.0 - 100.0 fL   MCH 30.3 26.0 - 34.0 pg   MCHC 31.5 30.0 - 36.0 g/dL   RDW 40.1 02.7 - 25.3 %   Platelets 188 150 - 400 K/uL   nRBC 0.0 0.0 - 0.2 %   Neutrophils Relative % 78 %   Neutro Abs 6.5 1.7 - 7.7 K/uL   Lymphocytes Relative 10 %   Lymphs Abs 0.8 0.7 - 4.0 K/uL   Monocytes Relative 11 %   Monocytes Absolute 0.9 0.1 - 1.0 K/uL   Eosinophils Relative 0 %   Eosinophils Absolute 0.0 0.0 - 0.5 K/uL   Basophils Relative 0 %   Basophils Absolute 0.0 0.0 - 0.1 K/uL   Immature Granulocytes 1 %   Abs Immature Granulocytes 0.04 0.00 - 0.07 K/uL    Comment: Performed at [facility] Lab, [address] [place], [place] [address]  Protime-INR     Status: Abnormal   Collection Time: 03/16/23 12:00 AM  Result Value Ref Range   Prothrombin Time 16.1 (H) 11.4 - 15.2 seconds   INR 1.3 (H) 0.8 - 1.2    Comment: (NOTE) INR goal varies based on device and disease states. Performed at [facility] Lab, [address]  [place], [place] [address]   Type and screen [facility]     Status: None   Collection Time: 03/16/23 12:00 AM  Result Value Ref Range   ABO/RH(D) O POS  Antibody Screen NEG    Sample Expiration      03/19/2023,2359 Performed at [facility] Lab, [address] [place], [place] [address]   I-Stat CG4 Lactic Acid, ED     Status: None   Collection Time: 03/16/23 12:14 AM  Result Value Ref Range   Lactic Acid, Venous 1.2 0.5 - 1.9 mmol/L  Basic metabolic panel     Status: Abnormal   Collection Time: 03/16/23  3:34 AM  Result Value Ref Range   Sodium 136 135 - 145 mmol/L   Potassium 5.9 (H) 3.5 - 5.1 mmol/L   Chloride 93 (L) 98 - 111 mmol/L   CO2 24 22 - 32 mmol/L   Glucose, Bld 98 70 - 99 mg/dL    Comment: Glucose reference range applies only to samples taken after fasting for at least 8 hours.   BUN 85 (H) 6 - 20 mg/dL   Creatinine, Ser 60.45 (H) 0.61 - 1.24 mg/dL   Calcium 40.9 8.9 - 81.1 mg/dL   GFR, Estimated 4 (L) >60 mL/min    Comment: (NOTE) Calculated using the CKD-EPI Creatinine Equation (2021)    Anion gap 19 (H) 5 - 15    Comment: Performed at [facility] Lab, [address] [place], [place] [address]  CBC     Status: Abnormal   Collection Time: 03/16/23  3:34 AM  Result Value Ref Range   WBC 7.8 4.0 - 10.5 K/uL   RBC 3.66 (L) 4.22 - 5.81 MIL/uL   Hemoglobin 11.1 (L) 13.0 - 17.0 g/dL   HCT 29.5 (L) 62.1 - 30.8 %   MCV 95.4 80.0 - 100.0 fL   MCH 30.3 26.0 - 34.0 pg   MCHC 31.8 30.0 - 36.0 g/dL   RDW 65.7 84.6 - 96.2 %   Platelets 164 150 - 400 K/uL   nRBC 0.0 0.0 - 0.2 %    Comment: Performed at [facility] Lab, [address], [place], [place] [address]   EKG: .  ROS: see hpi    Physical Exam: Vitals:   03/16/23 0700 03/16/23 0730  BP: (!) 158/101 (!) 160/98  Pulse: 94 96  Resp: 19 (!) 25  Temp:    SpO2: 96% 98%     General: Alert pleasant obese male NAD HEENT: [place], PERRLA, nonicteric, MMM Neck no jvd  Heart: RRR no  MRG Lungs: CTA bilaterally nonlabored breathing room air 96% O2 sat Abdomen: NABS, soft tenderness right upper lower quadrant no rebound no ascites appreciated nondistended Extremities: Trace bipedal edema Skin: No overt rash or ulcers appreciated Neuro: Alert x 3 , no focal deficits appreciated appreciated Dialysis Access: AV fistula positive bruit mild aneurysmal  Dialysis Orders: Center: [place], MWF, 4 hours 30 minutes  . EDW 134.1, kg  2K bath 2 Ca bath, UF profile 4 Heparin 2,800 units  calcitriol 2.25 mcg po  q hd   Mircera 100 mcg  q 2 wks last given 7/12    Assessment/Plan Volume overload secondary to decreased HD time= 4 L UF today on dialysis as tolerated  Hyperkalemia= K5.9  in er =  Lokelma given in ER ,HD today Lower GI bleed= workup per admission team GI consulted ESRD -HD MWF Hypertension/volume  -noted as above hypertension probably due to volume does not take BP meds UF as tolerated today Anemia of ESRD GI bleed-Hgb 11.1 no ESA needs for now follow-up trend Metabolic bone disease -calcium 10.0, hold vitamin D for now(recent PTH past 252),  phosphorus binders and taking p.o. /add phosphorus level to blood already drawn /follow-up calcium phosphorus trend Nutrition -renal diet when taking p.o.'s albumin 3 6 Bladder cancer= going maintenance withmonthly intravesical chemotherapy with [facility]   Prostate cancer=Undergoing treatment with leuprolide with [facility]    Lenny Pastel, PA-C [facility] Beeper [phone] 03/16/2023, 8:37 AM

## 2023-03-16 NOTE — Progress Notes (Signed)
Pt receives out-pt HD at [facility] on MWF. Will assist as needed.   Tracy Mounce Renal Navigator [phone] 

## 2023-03-16 NOTE — Progress Notes (Signed)
   03/16/23 1729  Vitals  Temp 98 F (36.7 C)  Temp Source Oral  BP 130/80  BP Location Right Arm  BP Method Automatic  Patient Position (if appropriate) Lying  Pulse Rate 92  Resp 20  Oxygen Therapy  SpO2 98 %  O2 Device Room Air  During Treatment Monitoring  Intra-Hemodialysis Comments Tx completed  Post Treatment  Dialyzer Clearance Lightly streaked  Duration of HD Treatment -hour(s) 4.25 hour(s)  Hemodialysis Intake (mL) 0 mL  Liters Processed 98  Fluid Removed (mL) 2600 mL  Tolerated HD Treatment Yes  Post-Hemodialysis Comments Pt goal met.  AVG/AVF Arterial Site Held (minutes) 10 minutes  AVG/AVF Venous Site Held (minutes) 10 minutes

## 2023-03-16 NOTE — Procedures (Signed)
I was present at this dialysis session, have reviewed the session itself and made  appropriate changes  UFG 3L tolerating so far which is a 1kg challenge to EDW  L RC AVF with scab over aneurysm -- says no bleeding or oozing and it's actually improving --> staff here cannulating away from this area and says no issues on cannulating  Reviewed emergency procedure with him if he does have spontaneous bleeding at home  Estill Bakes MD [facility] pager [phone]   03/16/2023, 1:09 PM

## 2023-03-16 NOTE — ED Notes (Signed)
Patient transported to Ultrasound 

## 2023-03-16 NOTE — H&P (Signed)
History and Physical    Devin Bullock YQM:[id number] DOB: [birth year] DOA: 03/15/2023  PCP: [name], MD   Patient coming from: Home   Chief Complaint: Rectal bleeding   HPI: Devin Bullock is a pleasant 57 y.o. male with medical history significant for ESRD on hemodialysis, prostate cancer, and bladder cancer who presents to the emergency department for evaluation of rectal bleeding.  Patient reports that he had been seeing some red blood mixed with stool intermittently for the past month, but then yesterday passed frank blood and clots when he felt that he was going to have a bowel movement.  There was some mild lower abdominal discomfort associated with this.  He had an episode of N/V on 03/14/2023 but there was no apparent blood products in that.   Patient states that his dialysis session was stopped after 30 minutes on 03/14/2023 due to the nausea and vomiting.  He feels that he has too much fluid on him now, complaining of exertional dyspnea.  He also reports having some drops of blood from his urethra for a few weeks which is being followed by his urologist at [facility] where he is undergoing intravesicular chemotherapy installations for bladder cancer.  He states he has been anuric for years now.  ED Course: Upon arrival to the ED, patient is found to be afebrile and saturating well on room air with slightly elevated heart rate and elevated blood pressure.  Labs are most notable for potassium 5.6, BUN 82, normal lactate, and hemoglobin 11.6.  There was frank blood on DRE per report of ED physician.  Nephrology was consulted by the ED physician and the patient was treated with [facility].  Review of Systems:  All other systems reviewed and apart from HPI, are negative.  Past Medical History:  Diagnosis Date   Anemia    AV fistula (HCC)    Cancer (HCC)    prostate and bladder cancer - 2022 diagnosed   CHF (congestive heart failure) (HCC)    patient denies   Chronic kidney disease     Dialysis patient [facility])    Enlarged heart    History of blood transfusion 03/2020   Hypertension    history of, no longer taking medication at this time   Morbid obesity (HCC)    Parathyroid disorder [facility])     Past Surgical History:  Procedure Laterality Date   AV FISTULA PLACEMENT Left 05/24/2014   Procedure: BRACHIOCEPHALIC ARTERIOVENOUS (AV) FISTULA CREATION;  Surgeon: Chuck Hint, MD;  Location: [facility] OR;  Service: Vascular;  Laterality: Left;   ESOPHAGOGASTRODUODENOSCOPY (EGD) WITH PROPOFOL N/A 04/03/2020   Procedure: ESOPHAGOGASTRODUODENOSCOPY (EGD) WITH PROPOFOL;  Surgeon: Kerin Salen, MD;  Location: [facility] ENDOSCOPY;  Service: Gastroenterology;  Laterality: N/A;   PROSTATE BIOPSY N/A 11/27/2020   Procedure: BIOPSY TRANSRECTAL ULTRASONIC PROSTATE (TUBP);  Surgeon: Crista Elliot, MD;  Location: [facility] ORS;  Service: Urology;  Laterality: N/A;  REQUESTING 46 MINS FOR CASE   REVISON OF ARTERIOVENOUS FISTULA Left 01/08/2021   Procedure: REVISON PLICATION OF ARTERIOVENOUS FISTULA LEFT;  Surgeon: Maeola Harman, MD;  Location: [facility] OR;  Service: Vascular;  Laterality: Left;   TRANSURETHRAL RESECTION OF BLADDER TUMOR N/A 11/27/2020   Procedure: TRANSURETHRAL RESECTION OF BLADDER TUMOR (TURBT);  Surgeon: Crista Elliot, MD;  Location: [facility] ORS;  Service: Urology;  Laterality: N/A;  NEED PARALYSIS    Social History:   reports that he has never smoked. He has never been exposed to tobacco smoke. He  has never used smokeless tobacco. He reports that he does not drink alcohol and does not use drugs.  No Known Allergies  Family History  Problem Relation Age of Onset   Diabetes Mother    Hypertension Mother    Heart attack Mother 76   Stroke Father    Hypertension Father    Depression Father    Diabetes Brother    Hypertension Brother    Stroke Brother 92   Alcohol abuse Brother    Asthma Brother      Prior to Admission medications   Medication Sig Start Date End Date  Taking? Authorizing Provider  cinacalcet (SENSIPAR) 30 MG tablet Take 30 mg by mouth 2 (two) times daily with a meal.    [provider]  HYDROcodone-acetaminophen (NORCO) 5-325 MG tablet Take 1 tablet by mouth every 4 (four) hours as needed for moderate pain or severe pain. 01/08/21   Baglia, Corrina, PA-C  multivitamin (RENA-VIT) TABS tablet Take 1 tablet by mouth daily.    [provider]  RENAGEL 800 MG tablet Take 1,600-3,200 mg by mouth See admin instructions. Take 3200 mg with each meal and 1600 mg with each snack 02/01/18   [provider]    Physical Exam: Vitals:   03/16/23 0300 03/16/23 0325 03/16/23 0400 03/16/23 0430  BP: (!) 163/102 (!) 167/111 (!) 168/96 (!) 151/91  Pulse: 92 96 100 95  Resp: (!) 23 (!) 26 (!) 27 (!) 27  Temp: 98.4 F (36.9 C)     TempSrc: Oral     SpO2: 94% 98% 96% 96%  Weight:      Height:        Constitutional: NAD, calm  Eyes: PERTLA, lids and conjunctivae normal ENMT: Mucous membranes are moist. Posterior pharynx clear of any exudate or lesions.   Neck: supple, no masses  Respiratory:  Dyspneic with speech. No wheezing. No accessory muscle use.  Cardiovascular: S1 & S2 heard, regular rate and rhythm. Trace ankle edema.  Abdomen: no tenderness, soft. Bowel sounds active.  Musculoskeletal: no clubbing / cyanosis. No joint deformity upper and lower extremities.   Skin: no significant rashes, lesions, ulcers. Warm, dry, well-perfused. Neurologic: CN 2-12 grossly intact. Moving all extremities. Alert and oriented.  Psychiatric: Pleasant. Cooperative.    Labs and Imaging on Admission: I have personally reviewed following labs and imaging studies  CBC: Recent Labs  Lab 03/16/23 0000 03/16/23 0334  WBC 8.3 7.8  NEUTROABS 6.5  --   HGB 11.6* 11.1*  HCT 36.8* 34.9*  MCV 96.1 95.4  PLT 188 164   Basic Metabolic Panel: Recent Labs  Lab 03/16/23 0000 03/16/23 0334  NA 134* 136  K 5.6* 5.9*  CL 95* 93*  CO2 22 24   GLUCOSE 97 98  BUN 82* 85*  CREATININE 13.97* 14.42*  CALCIUM 10.0 10.0   GFR: Estimated Creatinine Clearance: 8.3 mL/min (A) (by C-G formula based on SCr of 14.42 mg/dL (H)). Liver Function Tests: Recent Labs  Lab 03/16/23 0000  AST 21  ALT 16  ALKPHOS 80  BILITOT 0.8  PROT 7.1  ALBUMIN 3.6   No results for input(s): "LIPASE", "AMYLASE" in the last 168 hours. No results for input(s): "AMMONIA" in the last 168 hours. Coagulation Profile: Recent Labs  Lab 03/16/23 0000  INR 1.3*   Cardiac Enzymes: No results for input(s): "CKTOTAL", "CKMB", "CKMBINDEX", "TROPONINI" in the last 168 hours. BNP (last 3 results) No results for input(s): "PROBNP" in the last 8760 hours. HbA1C: No  results for input(s): "HGBA1C" in the last 72 hours. CBG: No results for input(s): "GLUCAP" in the last 168 hours. Lipid Profile: No results for input(s): "CHOL", "HDL", "LDLCALC", "TRIG", "CHOLHDL", "LDLDIRECT" in the last 72 hours. Thyroid Function Tests: No results for input(s): "TSH", "T4TOTAL", "FREET4", "T3FREE", "THYROIDAB" in the last 72 hours. Anemia Panel: No results for input(s): "VITAMINB12", "FOLATE", "FERRITIN", "TIBC", "IRON", "RETICCTPCT" in the last 72 hours. Urine analysis:    Component Value Date/Time   COLORURINE YELLOW 05/04/2019 2242   APPEARANCEUR CLOUDY (A) 05/04/2019 2242   LABSPEC 1.009 05/04/2019 2242   PHURINE 7.0 05/04/2019 2242   GLUCOSEU 50 (A) 05/04/2019 2242   HGBUR MODERATE (A) 05/04/2019 2242   BILIRUBINUR NEGATIVE 05/04/2019 2242   KETONESUR NEGATIVE 05/04/2019 2242   PROTEINUR >=300 (A) 05/04/2019 2242   UROBILINOGEN 0.2 04/13/2013 0743   NITRITE NEGATIVE 05/04/2019 2242   LEUKOCYTESUR LARGE (A) 05/04/2019 2242   Sepsis Labs: @LABRCNTIP (procalcitonin:4,lacticidven:4) )No results found for this or any previous visit (from the past 240 hour(s)).   Radiological Exams on Admission: CT ABDOMEN PELVIS WO CONTRAST  Result Date: 03/16/2023 CLINICAL DATA:   Right-sided abdominal pain, GI bleed and hematuria. EXAM: CT ABDOMEN AND PELVIS WITHOUT CONTRAST TECHNIQUE: Multidetector CT imaging of the abdomen and pelvis was performed following the standard protocol without IV contrast. RADIATION DOSE REDUCTION: This exam was performed according to the departmental dose-optimization program which includes automated exposure control, adjustment of the mA and/or kV according to patient size and/or use of iterative reconstruction technique. COMPARISON:  10/21/2020. FINDINGS: Lower chest: Heart is mildly enlarged and there is a small pericardial effusion. Mild atelectasis is noted at the left lung base. Hepatobiliary: Cysts are noted in the liver, additional subcentimeter hypodensities are present which are too small to further characterize. The gallbladder is without stones. No biliary ductal dilatation. Pancreas: Unremarkable. No pancreatic ductal dilatation or surrounding inflammatory changes. Spleen: Normal in size without focal abnormality. Adrenals/Urinary Tract: The adrenal glands are within normal limits. Renal atrophy is noted bilaterally. Cysts are noted in the kidneys bilaterally. Additional hyperdense foci are present in the kidneys which can not be further characterize on this single-phase exam. Renal calculi are noted on the left. There is mild hydroureteronephrosis on the right with hyperdense attenuation in the collecting system, possible blood products. Perinephric and periureteral fat stranding are noted on the right. No obstructive uropathy on the left. There is diffuse bladder wall thickening with perivesicular fat stranding. Hyperdense material is noted in the bladder, suggesting blood products. Stomach/Bowel: A 1.1 cm fat attenuation lesion is noted in the proximal stomach suggesting lipoma. No bowel obstruction, free air, or pneumatosis. Normal appendix is seen in the right lower quadrant. Scattered diverticula are present along the colon without evidence  of diverticulitis. No focal bowel wall thickening or hemorrhage is seen. Vascular/Lymphatic: Aortic atherosclerosis. No enlarged abdominal or pelvic lymph nodes. Reproductive: Prostate is unremarkable. Other: No abdominopelvic ascites. Fat containing inguinal hernias are present bilaterally. Fat containing umbilical hernia is present. Musculoskeletal: No acute osseous abnormality. IMPRESSION: 1. Mild hydroureteronephrosis on the right without evidence of obstructing stone. Hyperdense material is present in the collecting system on the right and urinary bladder, suggesting blood products. 2. Decompressed gallbladder with wall thickening, possible infectious or inflammatory cystitis. 3. Nonobstructive left renal calculi. 4. Diverticulosis without diverticulitis. 5. Bilateral renal atrophy and cysts. Additional hyperdense lesions are present in the kidneys bilaterally. Ultrasound is suggested for further characterization on nonemergent follow-up. 6. Aortic atherosclerosis. Electronically Signed   By: Vernona Rieger  Ladona Ridgel M.D.   On: 03/16/2023 02:21   DG Chest Port 1 View  Result Date: 03/16/2023 CLINICAL DATA:  Shortness of breath. EXAM: PORTABLE CHEST 1 VIEW COMPARISON:  April 02, 2020 FINDINGS: There is stable moderate severity cardiac silhouette enlargement. Low lung volumes are seen. Mild atelectasis and/or infiltrate is seen within the bilateral lung bases. No pleural effusion or pneumothorax is identified. The visualized skeletal structures are unremarkable. IMPRESSION: Stable cardiomegaly with low lung volumes with mild bibasilar atelectasis and/or infiltrate. Electronically Signed   By: Aram Candela M.D.   On: 03/16/2023 00:28    EKG: Independently reviewed. Sinus tachycardia, rate 102, LAFB.   Assessment/Plan   1. Lower GI bleeding  - Presents with 1 month of red blood mixed with stool intermittently, then frank blood and clots yesterday  - Frank blood on DRE reported by ED physician   - He is  hemodynamically stable and initial Hgb is 11.6 (11.4 in November 2022)  - Colonic diverticula noted on CT without evidence for active hemorrhage - Secure chat sent to [facility] with request for routine consultation   - Bowel rest, trend H&H, follow-up on GI recommendations      2. ESRD; hypervolemia; hyperkalemia  - HD stopped after 30 minutes on 7/29 per pt report d/t N/V  - He is hypervolemic on admission without respiratory distress and potassium is mildly elevated  - Nephrology consulted by ED physician and Thompson Caul was given  - Restrict fluids, renally-dose medications, repeat chem panel in am    3. Bladder cancer  - Undergoing maintenance monthly intravesical chemotherapy with [facility]    4. Prostate cancer   - Undergoing treatment with leuprolide with [facility]     DVT prophylaxis: SCDs  Code Status: Full  Level of Care: Level of care: Telemetry Medical Family Communication: None present  Disposition Plan:  Patient is from: home  Anticipated d/c is to: Home  Anticipated d/c date is: 03/18/23  Patient currently: Pending stable H&H, will likely need inpatient dialysis  Consults called: Nephrology consulted by ED physician. Secure chat sent to [facility] with request for routine consultation.   Admission status: Inpatient     Briscoe Deutscher, MD [facility]  03/16/2023, 5:31 AM

## 2023-03-16 NOTE — Progress Notes (Signed)
Pt states he is not having the colonoscopy done here, but will have it done at [facility]. Requesting solid food as current diet is for clear liquids. States he thought he was going to be discharged after dialysis, but reports he will need ambulance transportation to home. Hughie Closs, MD notified. No new orders at this time.

## 2023-03-16 NOTE — ED Notes (Signed)
Pt transported to dialysis

## 2023-03-17 NOTE — Discharge Summary (Signed)
@LOGO @   PT LEFT AMA SUMMARY  Devin Bullock MRN - [id number] DOB - November 14, 1965  Date of Admission - 03/15/2023 Date LEFT AMA: 03/16/2023  Attending Physician:  Hughie Closs, MD  Patient's PCP:  [name], MD  Disposition: LEFT AMA  Follow-up Appts:  Not able to be arranged or discussed as pt LEFT AMA  Diagnoses at time pt LEFT AMA: Hematochezia, ESRD on HD, penile bleeding  Initial presentation: HPI: Devin Bullock is a pleasant 57 y.o. male with medical history significant for ESRD on hemodialysis, prostate cancer, and bladder cancer who presents to the emergency department for evaluation of rectal bleeding.   Patient reports that he had been seeing some red blood mixed with stool intermittently for the past month, but then yesterday passed frank blood and clots when he felt that he was going to have a bowel movement.  There was some mild lower abdominal discomfort associated with this.  He had an episode of N/V on 03/14/2023 but there was no apparent blood products in that.    Patient states that his dialysis session was stopped after 30 minutes on 03/14/2023 due to the nausea and vomiting.  He feels that he has too much fluid on him now, complaining of exertional dyspnea.  He also reports having some drops of blood from his urethra for a few weeks which is being followed by his urologist at [facility] where he is undergoing intravesicular chemotherapy installations for bladder cancer.  He states he has been anuric for years now.   ED Course: Upon arrival to the ED, patient is found to be afebrile and saturating well on room air with slightly elevated heart rate and elevated blood pressure.  Labs are most notable for potassium 5.6, BUN 82, normal lactate, and hemoglobin 11.6.  There was frank blood on DRE per report of ED physician.   Nephrology was consulted by the ED physician and the patient was treated with [facility].  Hospital Course: Listed below are the active problems present,  and the status of the care of these problems, at the time the pt decided to LEAVE AMA: This is a 57 year old gentleman with several comorbidities including ESRD on HD on MWF schedule, prostate cancer and bladder cancer s/p radiation therapy who was admitted after midnight under hospitalist service mainly because of rectal bleeding.  This has been ongoing for last few days.  Upon arrival, patient hemodynamically stable with elevated blood pressure meeting criteria for hypertensive urgency.  Patient reported that he does not have any history of hypertension and that his blood pressure is elevated because he is fluid overloaded.  He received only 30 minutes of dialysis on Monday and this had to be aborted since he was nauseous and started having abdominal pain.  Patient's hemoglobin upon arrival was stable as well.  Patient seen and examined in the ED earlier on the day on 03/16/2023, he was complaining of abdominal pain.   Rectal bleeding for few days as well but worse yesterday so he came to the ED.  Denied any shortness of breath but he felt that he was fluid overloaded.  Nephrology as well as GI was consulted.  He was seen by GI and they plan to do colonoscopy on Friday and wanted for him to stay on clear liquid diet.  He was then seen by nephrology and underwent dialysis.  He was placed on as needed IV hydralazine since his elevated blood pressure was likely secondary to missing dialysis.  Eventually once  he returned from the dialysis, he demanded the nurses to put him on regular diet despite of knowing that he needed to be on clear liquid diet.  Apparently night hospitalist as well as on-call GI was called and due to his persistence, he was transitioned to regular diet but he did not like the food that was available that late in the cafeteria and demanded discharge but he was not officially discharged by the night hospitalist and eventually he decided to leave AGAINST MEDICAL ADVICE as he did not want colonoscopy  done here and [facility].  Regarding his penile bleeding, when I saw him in the morning, we decided that we are going to watch him for next 1 to 2 days because he has [facility] as well and penile bleeding was ongoing for about a month and he was scheduled for cystoscopy which he missed due to not feeling well.  It made more sense for him to wait and have cystoscopy done by his urologist who knows the case very well.  We mutually agreed upon that decision as well.  Nonetheless, at the night of 03/16/2023, patient left AGAINST MEDICAL ADVICE.     Medication List    Unable to be finalized as pt LEFT AMA  Day of Discharge Wt Readings from Last 3 Encounters:  03/16/23 134.4 kg  10/16/21 (!) 138.9 kg  01/08/21 136.1 kg   Temp Readings from Last 3 Encounters:  03/16/23 98.3 F (36.8 C) (Oral)  10/16/21 (!) 97.5 F (36.4 C) (Temporal)  01/08/21 97.8 F (36.6 C) (Oral)   BP Readings from Last 3 Encounters:  03/16/23 112/74  10/16/21 105/76  01/08/21 118/63   Pulse Readings from Last 3 Encounters:  03/16/23 93  10/16/21 (!) 114  01/08/21 97    Physical Exam: Exam not able to be completed at time of d/c as pt LEFT AMA  7:37 AM 03/17/23  Hughie Closs, MD [facility] Office  [phone]

## 2023-03-17 NOTE — Hospital Course (Signed)
[facility] Patient Discharge Orders- [facility] CLINIC: [place]  Patient's name: Devin Bullock Admit/DC Dates: 03/15/2023 - 03/16/2023  Discharge Diagnoses: Hematochezia,    hgb 11.1  Penile bleeding ESRD on HD,  Aranesp: Given: no    Date and amount of last dose: 0  Last Hgb: 11.1 PRBC's Given: 0 Date/# of units: 0 ESA dose for discharge: mircera 0 mcg IV q 2 weeks  IV Iron dose at discharge: 0  Heparin change: yes no hep  ( for now) with lower gi bleed  EDW Change: no New EDW: no changes   Bath Change: 0  Access intervention/Change: 0 Details:  Hectorol/Calcitriol change: 0  Discharge Labs: Calcium   10.0  Phosphorus 3.5 Albumin 3.6 K+ 5.9  IV Antibiotics: no Details:  On Coumadin?: no Last INR: Next INR: Managed By:   OTHER/APPTS/LAB ORDERS:    D/C Meds to be reconciled by nurse after every discharge.  Completed By:   Reviewed by: MD:______ RN_______

## 2023-03-17 NOTE — Progress Notes (Signed)
At beginning of the shift, the patient was on Clear Liquid Diet.  He wanted his diet changed to "real food".  He also stated that he was not going to have the colonoscopy here but at [facility].  Dr. Loney Loh called.  MD unable to change based on Gastroenterology's note.  Dr. Preston Fleeting (GI) called and made aware.  Order received to change diet to Renal/1200 FR.  Cafeteria was closed and patient did not want the [place] Sandwich tray d/t amount of sodium.  He wanted to go home by ambulance.  Advised that ambulance would not be able to be set up until the morning d/t to no Child psychotherapist.  He then stated he would go home by taxi but wanted the MD to do discharge instructions.  Dr. Loney Loh made aware and unable to do at this time.  Pt made aware and signed the "Leaving Against Medical Advice" paper.  PIV and Telemetry removed per patient's request.  Patient safely discharged via Taxi.  Bernie Covey RN

## 2023-03-17 NOTE — Progress Notes (Addendum)
[facility] Patient Discharge     Orders- [facility] CLINIC: [place]   PT LEFT [place]   Patient's name: Devin Bullock Admit/DC Dates: 03/15/2023 - 03/16/2023     PT LEFT AMA   Discharge Diagnoses: Hematochezia,    hgb 11.1  Penile bleeding ESRD on HD,  Aranesp: Given: no    Date and amount of last dose: 0  Last Hgb: 11.1 PRBC's Given: 0 Date/# of units: 0 ESA dose for discharge: mircera 0 mcg IV q 2 weeks  IV Iron dose at discharge: 0  Heparin change: yes no hep  ( for now) with lower gi bleed  EDW Change: no New EDW: no changes   Bath Change: 0  Access intervention/Change: 0 Details:  Hectorol/Calcitriol change: 0  Discharge Labs: Calcium   10.0  Phosphorus 3.5 Albumin 3.6 K+ 5.9  IV Antibiotics: no Details:  On Coumadin?: no Last INR: Next INR: Managed By:   OTHER/APPTS/LAB ORDERS:    D/C Meds to be reconciled by nurse after every discharge.  Completed By:   Reviewed by: MD:______ RN_______

## 2023-03-17 NOTE — Progress Notes (Signed)
Late Note Entry- March 17, 2023  Pt left AMA last evening. Contacted [facility] this morning to make clinic aware that pt should resume care tomorrow.   Olivia Canter Renal Navigator [phone]

## 2023-03-18 SURGERY — COLONOSCOPY WITH PROPOFOL
Anesthesia: Monitor Anesthesia Care

## 2023-03-23 ENCOUNTER — Encounter: Payer: Self-pay | Admitting: Physician Assistant

## 2023-04-07 ENCOUNTER — Ambulatory Visit (HOSPITAL_COMMUNITY)
Admission: EM | Admit: 2023-04-07 | Discharge: 2023-04-07 | Disposition: A | Discharge status: Home / Self Care | Payer: Medicaid Other

## 2023-04-07 ENCOUNTER — Encounter (HOSPITAL_COMMUNITY): Payer: Self-pay

## 2023-04-07 DIAGNOSIS — H6123 Impacted cerumen, bilateral: Secondary | ICD-10-CM | POA: Diagnosis not present

## 2023-04-07 NOTE — Discharge Instructions (Signed)
We flushed out the earwax from your ear(s) today.  Do not use Q-tips as this makes symptoms worse and pushes wax further into the ear canal.  You may use over-the-counter Debrox eardrops as needed for earwax removal at home as needed.  If you develop any new or worsening symptoms or if your symptoms do not start to improve, pleases return here or follow-up with your primary care provider. If your symptoms are severe, please go to the emergency room.

## 2023-04-07 NOTE — ED Triage Notes (Signed)
Patient here today with c/o left ear pain X 3 days. He has difficulty hearing.

## 2023-04-07 NOTE — ED Provider Notes (Signed)
[facility]    CSN: [id number] Arrival date & time: 04/07/23  1222      History   Chief Complaint Chief Complaint  Patient presents with   Otalgia    HPI Devin Bullock is a 57 y.o. male.   Patient presents to urgent care for evaluation of decreased hearing to the left ear that started approximately 5 to 6 days ago.  No recent trauma/injuries to the ear, viral URI symptoms, fever/chills, dizziness, tinnitus, or nausea/vomiting.  No headaches.  He does not use hearing aids and does not use Q-tips to clean out his ears.  He has not noted any drainage from the bilateral ears.  He has not attempted treatment of symptoms PTA.   Otalgia   Past Medical History:  Diagnosis Date   Anemia    AV fistula (HCC)    Cancer (HCC)    prostate and bladder cancer - 2022 diagnosed   CHF (congestive heart failure) (HCC)    patient denies   Chronic kidney disease    Dialysis patient [facility])    Enlarged heart    History of blood transfusion 03/2020   Hypertension    history of, no longer taking medication at this time   Morbid obesity (HCC)    Parathyroid disorder [facility])     Patient Active Problem List   Diagnosis Date Noted   Lower GI bleed 03/16/2023   Prostate cancer (HCC) 03/16/2023   Hyperkalemia 03/16/2023   Acute GI bleeding 03/16/2023   Bladder cancer (HCC) 07/28/2021   HFrEF (heart failure with reduced ejection fraction) (HCC) 07/02/2021   Thyromegaly 07/02/2021   Fistula, other specified site 08/13/2020   COVID-19 05/19/2020   HTN (hypertension) with goal to be determined    Duodenitis    Symptomatic anemia 04/02/2020   Upper GI bleed    Anaphylactic shock, unspecified, sequela 05/25/2019   Urinary tract infection, site not specified 04/02/2019   Secondary hyperparathyroidism of renal origin (HCC) 11/28/2018   Fluid overload, unspecified 03/26/2017   Dependence on renal dialysis (HCC) 10/19/2016   Encounter for immunization 10/15/2016   Cardiomyopathy,  unspecified (HCC) 09/22/2016   Coagulation defect, unspecified (HCC) 09/22/2016   Iron deficiency anemia, unspecified 09/22/2016   Male erectile dysfunction, unspecified 09/22/2016   CKD (chronic kidney disease) 07/03/2014   ESRD (end stage renal disease) on dialysis (HCC) 08/08/2013   Chronic kidney disease (CKD), stage IV (severe) (HCC) 04/17/2013   Patient nonadherence 04/17/2013   Morbid obesity (HCC) 04/15/2013   Anemia 04/15/2013   Cardiomegaly 04/13/2013   Hypertensive emergency 04/13/2013   Acute on chronic combined systolic and diastolic CHF (congestive heart failure) (HCC) 04/13/2013    Past Surgical History:  Procedure Laterality Date   AV FISTULA PLACEMENT Left 05/24/2014   Procedure: BRACHIOCEPHALIC ARTERIOVENOUS (AV) FISTULA CREATION;  Surgeon: Chuck Hint, MD;  Location: [facility] OR;  Service: Vascular;  Laterality: Left;   ESOPHAGOGASTRODUODENOSCOPY (EGD) WITH PROPOFOL N/A 04/03/2020   Procedure: ESOPHAGOGASTRODUODENOSCOPY (EGD) WITH PROPOFOL;  Surgeon: Kerin Salen, MD;  Location: [facility] ENDOSCOPY;  Service: Gastroenterology;  Laterality: N/A;   PROSTATE BIOPSY N/A 11/27/2020   Procedure: BIOPSY TRANSRECTAL ULTRASONIC PROSTATE (TUBP);  Surgeon: Crista Elliot, MD;  Location: [facility] ORS;  Service: Urology;  Laterality: N/A;  REQUESTING 80 MINS FOR CASE   REVISON OF ARTERIOVENOUS FISTULA Left 01/08/2021   Procedure: REVISON PLICATION OF ARTERIOVENOUS FISTULA LEFT;  Surgeon: Maeola Harman, MD;  Location: [facility] OR;  Service: Vascular;  Laterality: Left;   TRANSURETHRAL RESECTION  OF BLADDER TUMOR N/A 11/27/2020   Procedure: TRANSURETHRAL RESECTION OF BLADDER TUMOR (TURBT);  Surgeon: Crista Elliot, MD;  Location: [facility] ORS;  Service: Urology;  Laterality: N/A;  NEED PARALYSIS       Home Medications    Prior to Admission medications   Medication Sig Start Date End Date Taking? Authorizing Provider  cinacalcet (SENSIPAR) 60 MG tablet Take 60 mg by mouth daily.   Yes  [provider]  Methoxy PEG-Epoetin Beta (MIRCERA IJ) Mircera 03/23/23 03/21/24 Yes [provider]  RENAGEL 800 MG tablet Take 1,600-3,200 mg by mouth See admin instructions. Take 3200 mg with each meal and 1600 mg with each snack 02/01/18  Yes [provider]  sucroferric oxyhydroxide (VELPHORO) 500 MG chewable tablet Chew 1,000-2,000 mg by mouth 3 (three) times daily with meals.    [provider]    Family History Family History  Problem Relation Age of Onset   Diabetes Mother    Hypertension Mother    Heart attack Mother 86   Stroke Father    Hypertension Father    Depression Father    Diabetes Brother    Hypertension Brother    Stroke Brother 29   Alcohol abuse Brother    Asthma Brother     Social History Social History   Tobacco Use   Smoking status: Never    Passive exposure: Never   Smokeless tobacco: Never  Vaping Use   Vaping status: Never Used  Substance Use Topics   Alcohol use: No   Drug use: No     Allergies   Patient has no known allergies.   Review of Systems Review of Systems  HENT:  Positive for ear pain.   Per HPI   Physical Exam Triage Vital Signs ED Triage Vitals  Encounter Vitals Group     BP 04/07/23 1240 (!) 147/80     Systolic BP Percentile --      Diastolic BP Percentile --      Pulse Rate 04/07/23 1240 89     Resp 04/07/23 1240 16     Temp 04/07/23 1240 (!) 97.5 F (36.4 C)     Temp Source 04/07/23 1240 Oral     SpO2 04/07/23 1240 96 %     Weight 04/07/23 1240 300 lb (136.1 kg)     Height 04/07/23 1240 6\' 4"  (1.93 m)     Head Circumference --      Peak Flow --      Pain Score 04/07/23 1239 7     Pain Loc --      Pain Education --      Exclude from Growth Chart --    No data found.  Updated Vital Signs BP (!) 147/80 (BP Location: Left Arm)   Pulse 89   Temp (!) 97.5 F (36.4 C) (Oral)   Resp 16   Ht 6\' 4"  (1.93 m)   Wt 300 lb (136.1 kg)   SpO2 96%   BMI 36.52 kg/m   Visual  Acuity Right Eye Distance:   Left Eye Distance:   Bilateral Distance:    Right Eye Near:   Left Eye Near:    Bilateral Near:     Physical Exam Vitals and nursing note reviewed.  Constitutional:      Appearance: He is not ill-appearing or toxic-appearing.  HENT:     Head: Normocephalic and atraumatic.     Right Ear: Hearing, tympanic membrane, ear canal and external ear normal. There  is impacted cerumen.     Left Ear: Hearing, tympanic membrane, ear canal and external ear normal. There is impacted cerumen.     Nose: Nose normal.     Mouth/Throat:     Lips: Pink.  Eyes:     General: Lids are normal. Vision grossly intact. Gaze aligned appropriately.     Extraocular Movements: Extraocular movements intact.     Conjunctiva/sclera: Conjunctivae normal.  Pulmonary:     Effort: Pulmonary effort is normal.  Musculoskeletal:     Cervical back: Neck supple.  Skin:    General: Skin is warm and dry.     Capillary Refill: Capillary refill takes less than 2 seconds.     Findings: No rash.  Neurological:     General: No focal deficit present.     Mental Status: He is alert and oriented to person, place, and time. Mental status is at baseline.     Cranial Nerves: No dysarthria or facial asymmetry.  Psychiatric:        Mood and Affect: Mood normal.        Speech: Speech normal.        Behavior: Behavior normal.        Thought Content: Thought content normal.        Judgment: Judgment normal.      UC Treatments / Results  Labs (all labs ordered are listed, but only abnormal results are displayed) Labs Reviewed - No data to display  EKG   Radiology No results found.  Procedures Procedures (including critical care time)  Medications Ordered in UC Medications - No data to display  Initial Impression / Assessment and Plan / UC Course  I have reviewed the triage vital signs and the nursing notes.  Pertinent labs & imaging results that were available during my care of the  patient were reviewed by me and considered in my medical decision making (see chart for details).   1.  Bilateral impacted cerumen Both ear(s) cleaned with ear lavage to remove ear wax impactions bilaterally by nursing staff. Reassessment shows clean ear canals with normal tympanic membranes without perforation or signs of otitis externa or otitis media. Patient may use debrox ear drops at home as needed for wax removal and has been advised to avoid using Q-tips.   Counseled patient on potential for adverse effects with medications prescribed/recommended today, strict ER and return-to-clinic precautions discussed, patient verbalized understanding.   Final Clinical Impressions(s) / UC Diagnoses   Final diagnoses:  Bilateral impacted cerumen     Discharge Instructions      We flushed out the earwax from your ear(s) today.  Do not use Q-tips as this makes symptoms worse and pushes wax further into the ear canal.  You may use over-the-counter Debrox eardrops as needed for earwax removal at home as needed.  If you develop any new or worsening symptoms or if your symptoms do not start to improve, pleases return here or follow-up with your primary care provider. If your symptoms are severe, please go to the emergency room.    ED Prescriptions   None    PDMP not reviewed this encounter.   Carlisle Beers, [place] 04/07/23 [phone]

## 2023-04-19 ENCOUNTER — Ambulatory Visit: Payer: Medicaid Other | Admitting: Physical Therapy

## 2023-04-21 ENCOUNTER — Ambulatory Visit: Payer: Medicaid Other

## 2023-04-26 ENCOUNTER — Other Ambulatory Visit (INDEPENDENT_AMBULATORY_CARE_PROVIDER_SITE_OTHER): Payer: Medicaid Other

## 2023-04-26 ENCOUNTER — Encounter: Payer: Self-pay | Admitting: Physician Assistant

## 2023-04-26 ENCOUNTER — Ambulatory Visit: Payer: Medicaid Other | Admitting: Physician Assistant

## 2023-04-26 ENCOUNTER — Telehealth: Payer: Self-pay | Admitting: Physician Assistant

## 2023-04-26 ENCOUNTER — Other Ambulatory Visit: Payer: Self-pay

## 2023-04-26 DIAGNOSIS — D649 Anemia, unspecified: Secondary | ICD-10-CM | POA: Diagnosis not present

## 2023-04-26 DIAGNOSIS — K625 Hemorrhage of anus and rectum: Secondary | ICD-10-CM

## 2023-04-26 DIAGNOSIS — Z923 Personal history of irradiation: Secondary | ICD-10-CM | POA: Diagnosis not present

## 2023-04-26 DIAGNOSIS — Z8546 Personal history of malignant neoplasm of prostate: Secondary | ICD-10-CM

## 2023-04-26 LAB — CBC WITH DIFFERENTIAL/PLATELET
Basophils Absolute: 0 10*3/uL (ref 0.0–0.1)
Basophils Relative: 0.6 % (ref 0.0–3.0)
Eosinophils Absolute: 0.2 10*3/uL (ref 0.0–0.7)
Eosinophils Relative: 4.2 % (ref 0.0–5.0)
HCT: 31.2 % — ABNORMAL LOW (ref 39.0–52.0)
Hemoglobin: 10.1 g/dL — ABNORMAL LOW (ref 13.0–17.0)
Lymphocytes Relative: 21.1 % (ref 12.0–46.0)
Lymphs Abs: 1 10*3/uL (ref 0.7–4.0)
MCHC: 32.5 g/dL (ref 30.0–36.0)
MCV: 95.2 fl (ref 78.0–100.0)
Monocytes Absolute: 0.6 10*3/uL (ref 0.1–1.0)
Monocytes Relative: 12.3 % — ABNORMAL HIGH (ref 3.0–12.0)
Neutro Abs: 2.8 10*3/uL (ref 1.4–7.7)
Neutrophils Relative %: 61.8 % (ref 43.0–77.0)
Platelets: 215 10*3/uL (ref 150.0–400.0)
RBC: 3.28 Mil/uL — ABNORMAL LOW (ref 4.22–5.81)
RDW: 16.7 % — ABNORMAL HIGH (ref 11.5–15.5)
WBC: 4.6 10*3/uL (ref 4.0–10.5)

## 2023-04-26 MED ORDER — HYDROCORTISONE ACETATE 25 MG RE SUPP: 25.0000 mg | Freq: Every day | RECTAL | 6 refills | Status: DC

## 2023-04-26 MED ORDER — NA SULFATE-K SULFATE-MG SULF 17.5-3.13-1.6 GM/177ML PO SOLN: 1.0000 | ORAL | 0 refills | Status: DC

## 2023-04-26 NOTE — Patient Instructions (Addendum)
_______________________________________________________  If your blood pressure at your visit was 140/90 or greater, please contact your primary care physician to follow up on this.  If you are age 59 or younger, your body mass index should be between 19-25. Your Body mass index is 37.49 kg/m. If this is out of the aformentioned range listed, please consider follow up with your Primary Care Provider.  ________________________________________________________  The [facility] providers would like to encourage you to use [facility] to communicate with providers for non-urgent requests or questions.  Due to long hold times on the telephone, sending your provider a message by [facility] may be a faster and more efficient way to get a response.  Please allow 48 business hours for a response.  Please remember that this is for non-urgent requests.  _______________________________________________________  Your provider has requested that you go to the basement level for lab work before leaving today. Press "B" on the elevator. The lab is located at the first door on the left as you exit the elevator.  Please purchase the following medications over the counter and take as directed:  START: Hydrocortisone (Anusol) suppositories at bedtime for 2 weeks, then repeat if still having symptoms.  You have been scheduled for a colonoscopy. Please follow written instructions given to you at your visit today.   Please pick up your prep supplies at the pharmacy within the next 1-3 days.  If you use inhalers (even only as needed), please bring them with you on the day of your procedure.  DO NOT TAKE 7 DAYS PRIOR TO TEST- Trulicity (dulaglutide) Ozempic, Wegovy (semaglutide) Mounjaro (tirzepatide) Bydureon Bcise (exanatide extended release)  DO NOT TAKE 1 DAY PRIOR TO YOUR TEST Rybelsus (semaglutide) Adlyxin (lixisenatide) Victoza (liraglutide) Byetta  (exanatide) ___________________________________________________________________________  Due to recent changes in healthcare laws, you may see the results of your imaging and laboratory studies on MyChart before your provider has had a chance to review them.  We understand that in some cases there may be results that are confusing or concerning to you. Not all laboratory results come back in the same time frame and the provider may be waiting for multiple results in order to interpret others.  Please give [place] 48 hours in order for your provider to thoroughly review all the results before contacting the office for clarification of your results.   Thank you for entrusting me with your care and choosing [facility].  Amy Esterwood, PA-C

## 2023-04-26 NOTE — Telephone Encounter (Signed)
Inbound call from patient stating suppositories that were called into his pharmacy are not covered by his insurance. Patient is requesting ot know if there is an alternative medication that is covered by his insurance. Patient requesting a call back. Please advise, thank you.

## 2023-04-26 NOTE — Telephone Encounter (Signed)
Returned call to the patient and he stated that he had already purchased Anusol suppositories, but they were very expensive.  Patient advised that once he runs out of Anusol, he can purchase hydrocortisone suppositories over-the-counter.  Patient agreed to plan and verbalized understanding.  No further questions.

## 2023-04-26 NOTE — Progress Notes (Signed)
Subjective:    Patient ID: Devin Bullock, male    DOB: 1965-09-15, 57 y.o.   MRN: [id number]  HPI  Achillies is a 57 year old African-American male, who comes in today for evaluation of persistent rectal bleeding.  He was actually seen in the ER on 03/16/2023 after he presented with rectal bleeding, and was to be admitted for evaluation.  He was seen in consultation by Doug Sou, PA-C and Dr. Leonides Schanz.  Plan was for colonoscopy however the patient left AMA, for reasons that are not clear.  He comes into the office today because of persistent symptoms, and wants to have a colonoscopy.  He has not had any prior colonoscopy, he did have an EGD in 2021 per Dr. Pati Gallo during an admission for hematemesis and at that time was found to have localized severely erythematous mucosa without active bleeding found in the duodenal bulb, and otherwise negative exam.  He was treated with a course of Protonix.  Patient has end-stage renal disease, is on dialysis Monday Wednesday and Friday, also has history of prostate CA for which she underwent 28 radiation treatments, and history of bladder cancer.  In addition with morbid obesity, weight 300/congestive heart failure with reduced EF, hypertension and cardiomyopathy.  EF was 45 to 50% in 2014 at echo.  Patient says that he started having bleeding in July and initially was noticing brown stool with red spots in it, then this increased to noting bright red and dark red blood on the tissue, and some in the commode.  He went to the ER after he started having seepage of blood through his clothes. Has no complaints of abdominal pain or cramping, no nausea, no heartburn or indigestion, says his bowel movements have been fairly regular and that most of the time his stools are brown but he sees blood with a bowel movement and in the commode and sometimes may be mixed in with stool. He has noticed just within the past couple of days that he may have a bulge at his anus but this has not  been uncomfortable.  He has no anorectal discomfort over the past months.  Labs done in the ER on 03/16/2023 showed hemoglobin 10.7/hematocrit 33.7/MCV 96 Pro time 16/INR 1.3 Potassium was 5.9/BUN 85/creatinine 14.42  Reviewing prior labs he did have a hemoglobin of 10.9 in May 2022.  He is not taking any aspirin or anti-inflammatories, no blood thinners.  He says he had been receiving heparin with dialysis but that has been stopped over his last several sessions.  He has been compliant with dialysis.  Review of Systems Pertinent positive and negative review of systems were noted in the above HPI section.  All other review of systems was otherwise negative.   Outpatient Encounter Medications as of 04/26/2023  Medication Sig   cinacalcet (SENSIPAR) 60 MG tablet Take 60 mg by mouth daily.   hydrocortisone (ANUSOL-HC) 25 MG suppository Place 1 suppository (25 mg total) rectally at bedtime.   Methoxy PEG-Epoetin Beta (MIRCERA IJ) Mircera   Na Sulfate-K Sulfate-Mg Sulf (SUPREP BOWEL PREP KIT) 17.5-3.13-1.6 GM/177ML SOLN Take 1 kit by mouth as directed.   RENAGEL 800 MG tablet Take 1,600-3,200 mg by mouth See admin instructions. Take 3200 mg with each meal and 1600 mg with each snack   sucroferric oxyhydroxide (VELPHORO) 500 MG chewable tablet Chew 1,000-2,000 mg by mouth 3 (three) times daily with meals. (Patient not taking: Reported on 04/26/2023)   No facility-administered encounter medications on file as of 04/26/2023.  No Known Allergies Patient Active Problem List   Diagnosis Date Noted   Lower GI bleed 03/16/2023   Prostate cancer (HCC) 03/16/2023   Hyperkalemia 03/16/2023   Acute GI bleeding 03/16/2023   Bladder cancer (HCC) 07/28/2021   HFrEF (heart failure with reduced ejection fraction) (HCC) 07/02/2021   Thyromegaly 07/02/2021   Fistula, other specified site 08/13/2020   COVID-19 05/19/2020   HTN (hypertension) with goal to be determined    Duodenitis    Symptomatic anemia  04/02/2020   Upper GI bleed    Anaphylactic shock, unspecified, sequela 05/25/2019   Urinary tract infection, site not specified 04/02/2019   Secondary hyperparathyroidism of renal origin (HCC) 11/28/2018   Fluid overload, unspecified 03/26/2017   Dependence on renal dialysis (HCC) 10/19/2016   Encounter for immunization 10/15/2016   Cardiomyopathy, unspecified (HCC) 09/22/2016   Coagulation defect, unspecified (HCC) 09/22/2016   Iron deficiency anemia, unspecified 09/22/2016   Male erectile dysfunction, unspecified 09/22/2016   CKD (chronic kidney disease) 07/03/2014   ESRD (end stage renal disease) on dialysis (HCC) 08/08/2013   Chronic kidney disease (CKD), stage IV (severe) (HCC) 04/17/2013   Patient nonadherence 04/17/2013   Morbid obesity (HCC) 04/15/2013   Anemia 04/15/2013   Cardiomegaly 04/13/2013   Hypertensive emergency 04/13/2013   Acute on chronic combined systolic and diastolic CHF (congestive heart failure) (HCC) 04/13/2013   Social History   Socioeconomic History   Marital status: Single    Spouse name: Not on file   Number of children: 2   Years of education: Not on file   Highest education level: Not on file  Occupational History   Occupation: disable  Tobacco Use   Smoking status: Never    Passive exposure: Never   Smokeless tobacco: Never  Vaping Use   Vaping status: Never Used  Substance and Sexual Activity   Alcohol use: No   Drug use: No   Sexual activity: Never  Other Topics Concern   Not on file  Social History Narrative   Lives alone.    Social Determinants of Health   Financial Resource Strain: Not on file  Food Insecurity: No Food Insecurity (07/28/2021)   Received from [facility] visits prior to 10/16/2022., [facility] visits prior to 10/16/2022.   Hunger Vital Sign    Worried About Programme researcher, broadcasting/film/video in the Last Year: Never true    Ran Out of Food in the Last Year: Never true   Transportation Needs: Not on file  Physical Activity: Not on file  Stress: Not on file  Social Connections: Not on file  Intimate Partner Violence: Not on file    Mr. Preble family history includes Alcohol abuse in his brother; Asthma in his brother; Depression in his father; Diabetes in his brother and mother; Heart attack (age of onset: 35) in his mother; Hypertension in his brother, father, and mother; Stroke in his father; Stroke (age of onset: 59) in his brother.      Objective:    Vitals:   04/26/23 1124  BP: 130/80  Pulse: 68    Physical Exam Well-developed obese older AA male in no acute distress.  Height, Weight,308  BMI37.4  HEENT; nontraumatic normocephalic, EOMI, PE R LA, sclera anicteric. Oropharynx;not examined today  Neck; supple, no JVD Cardiovascular; regular rate and rhythm with S1-S2, no murmur rub or gallop Pulmonary; Clear bilaterally Abdomen; soft, obese ,nontender, nondistended, no palpable mass or hepatosplenomegaly, bowel sounds are active Rectal;small external hemorrhoid, palpable  internal hemorrhoids on digital exam scant mucoid heme on glove, no mass palp  Skin; benign exam, no jaundice rash or appreciable lesions Extremities; no clubbing cyanosis or edema skin warm and dry Neuro/Psych; alert and oriented x4, grossly nonfocal mood and affect appropriate        Assessment & Plan:   #45 57 year old African-American male with end-stage renal disease on dialysis Monday Wednesday and Friday who was in the emergency room on 03/16/2023 with complaints of rectal bleeding, with dark red and red blood on the tissue and in the commode. Hemoglobin 10.7 at that time He was to be admitted and undergo colonoscopy however he left AMA.  Since that time he has had continued bleeding but says it is not as much volume as when he was in the emergency room he now is seeing blood just with bowel movements and notes dark red and red blood sometimes clots on the tissue and  sometimes blood in the commode, stool is brown, no complaints of abdominal pain or anal rectal pain.  No prior colonoscopy.  Etiology of his rectal bleeding is not clear at this time, rule out occult neoplasm, rule out bleeding secondary to internal hemorrhoids.  Patient has had prostate cancer and underwent radiation therapy and therefore radiation proctitis needs to be considered.  #2 anemia-chronic-normocytic #3 obesity with BMI 37/weight 308 #4.  Congestive heart failure with reduced EF no recent echo most recent echo EF 45 to 50% #5 history of bladder cancer #6.  Hypertension #7  Striae of GI bleed 2021 secondary to severe duodenitis no discrete lesion noted on EGD per Dr. Carole Binning, did require transfusions  Plan; repeat CBC today Start Anusol HC suppositories nightly x 2 weeks then repeat course as needed if persistently symptomatic with bleeding. Patient will be scheduled for colonoscopy with Dr. Leonides Schanz at the hospital due to end-stage renal disease on dialysis.  Procedure was discussed in detail with the patient including indications risk and benefits and he is agreeable to proceed.  He was advised that should he develop any significant increase in frequency or amount of bleeding over the upcoming weeks until his procedure can be done that he should seek care in the emergency room at [facility].  Plan   Javon Snee Oswald Hillock PA-C 04/26/2023   Cc: [name], MD

## 2023-04-26 NOTE — Progress Notes (Signed)
I agree with the assessment and plan as outlined by Ms. Esterwood. ?

## 2023-04-28 ENCOUNTER — Ambulatory Visit: Payer: Medicaid Other | Admitting: Physical Therapy

## 2023-05-05 ENCOUNTER — Ambulatory Visit: Payer: Medicaid Other

## 2023-05-10 ENCOUNTER — Ambulatory Visit: Payer: Medicaid Other | Admitting: Physical Therapy

## 2023-05-12 ENCOUNTER — Ambulatory Visit: Payer: Medicaid Other

## 2023-05-21 ENCOUNTER — Emergency Department (HOSPITAL_BASED_OUTPATIENT_CLINIC_OR_DEPARTMENT_OTHER): Payer: Medicaid Other

## 2023-05-21 ENCOUNTER — Other Ambulatory Visit: Payer: Self-pay

## 2023-05-21 ENCOUNTER — Encounter (HOSPITAL_BASED_OUTPATIENT_CLINIC_OR_DEPARTMENT_OTHER): Payer: Self-pay

## 2023-05-21 ENCOUNTER — Observation Stay (HOSPITAL_BASED_OUTPATIENT_CLINIC_OR_DEPARTMENT_OTHER)
Admission: EM | Admit: 2023-05-21 | Discharge: 2023-05-25 | Disposition: A | Discharge status: Home / Self Care | Payer: Medicaid Other | Attending: Family Medicine | Admitting: Family Medicine

## 2023-05-21 DIAGNOSIS — Z79899 Other long term (current) drug therapy: Secondary | ICD-10-CM | POA: Insufficient documentation

## 2023-05-21 DIAGNOSIS — I161 Hypertensive emergency: Secondary | ICD-10-CM | POA: Diagnosis not present

## 2023-05-21 DIAGNOSIS — I132 Hypertensive heart and chronic kidney disease with heart failure and with stage 5 chronic kidney disease, or end stage renal disease: Secondary | ICD-10-CM | POA: Insufficient documentation

## 2023-05-21 DIAGNOSIS — I471 Supraventricular tachycardia, unspecified: Principal | ICD-10-CM | POA: Diagnosis present

## 2023-05-21 DIAGNOSIS — N186 End stage renal disease: Secondary | ICD-10-CM

## 2023-05-21 DIAGNOSIS — D638 Anemia in other chronic diseases classified elsewhere: Secondary | ICD-10-CM | POA: Diagnosis present

## 2023-05-21 DIAGNOSIS — Z91199 Patient's noncompliance with other medical treatment and regimen due to unspecified reason: Secondary | ICD-10-CM

## 2023-05-21 DIAGNOSIS — R000 Tachycardia, unspecified: Secondary | ICD-10-CM

## 2023-05-21 DIAGNOSIS — Z992 Dependence on renal dialysis: Secondary | ICD-10-CM

## 2023-05-21 DIAGNOSIS — I5022 Chronic systolic (congestive) heart failure: Secondary | ICD-10-CM | POA: Diagnosis not present

## 2023-05-21 DIAGNOSIS — E042 Nontoxic multinodular goiter: Principal | ICD-10-CM

## 2023-05-21 DIAGNOSIS — Z1152 Encounter for screening for COVID-19: Secondary | ICD-10-CM | POA: Diagnosis not present

## 2023-05-21 DIAGNOSIS — D649 Anemia, unspecified: Secondary | ICD-10-CM | POA: Diagnosis present

## 2023-05-21 DIAGNOSIS — Z8546 Personal history of malignant neoplasm of prostate: Secondary | ICD-10-CM | POA: Insufficient documentation

## 2023-05-21 DIAGNOSIS — R7989 Other specified abnormal findings of blood chemistry: Secondary | ICD-10-CM | POA: Diagnosis not present

## 2023-05-21 DIAGNOSIS — E66812 Obesity, class 2: Secondary | ICD-10-CM | POA: Diagnosis present

## 2023-05-21 DIAGNOSIS — E041 Nontoxic single thyroid nodule: Secondary | ICD-10-CM | POA: Diagnosis not present

## 2023-05-21 LAB — CBC
HCT: 30 % — ABNORMAL LOW (ref 39.0–52.0)
HCT: 29.4 % — ABNORMAL LOW (ref 39.0–52.0)
Hemoglobin: 10 g/dL — ABNORMAL LOW (ref 13.0–17.0)
Hemoglobin: 9.5 g/dL — ABNORMAL LOW (ref 13.0–17.0)
MCH: 31.5 pg (ref 26.0–34.0)
MCH: 31 pg (ref 26.0–34.0)
MCHC: 33.3 g/dL (ref 30.0–36.0)
MCHC: 32.3 g/dL (ref 30.0–36.0)
MCV: 94.6 fL (ref 80.0–100.0)
MCV: 96.1 fL (ref 80.0–100.0)
Platelets: 161 10*3/uL (ref 150–400)
Platelets: 167 10*3/uL (ref 150–400)
RBC: 3.17 MIL/uL — ABNORMAL LOW (ref 4.22–5.81)
RBC: 3.06 MIL/uL — ABNORMAL LOW (ref 4.22–5.81)
RDW: 15.6 % — ABNORMAL HIGH (ref 11.5–15.5)
RDW: 15.8 % — ABNORMAL HIGH (ref 11.5–15.5)
WBC: 10 10*3/uL (ref 4.0–10.5)
WBC: 8.3 10*3/uL (ref 4.0–10.5)
nRBC: 0 % (ref 0.0–0.2)
nRBC: 0 % (ref 0.0–0.2)

## 2023-05-21 LAB — T4, FREE: Free T4: 1.04 ng/dL (ref 0.61–1.12)

## 2023-05-21 LAB — BASIC METABOLIC PANEL
Anion gap: 14 (ref 5–15)
BUN: 29 mg/dL — ABNORMAL HIGH (ref 6–20)
CO2: 29 mmol/L (ref 22–32)
Calcium: 8.9 mg/dL (ref 8.9–10.3)
Chloride: 93 mmol/L — ABNORMAL LOW (ref 98–111)
Creatinine, Ser: 6.58 mg/dL — ABNORMAL HIGH (ref 0.61–1.24)
GFR, Estimated: 9 mL/min — ABNORMAL LOW (ref >60)
Glucose, Bld: 159 mg/dL — ABNORMAL HIGH (ref 70–99)
Potassium: 3.6 mmol/L (ref 3.5–5.1)
Sodium: 136 mmol/L (ref 135–145)

## 2023-05-21 LAB — CREATININE, SERUM
Creatinine, Ser: 8.23 mg/dL — ABNORMAL HIGH (ref 0.61–1.24)
GFR, Estimated: 7 mL/min — ABNORMAL LOW (ref >60)

## 2023-05-21 LAB — TROPONIN I (HIGH SENSITIVITY)
Troponin I (High Sensitivity): 91 ng/L — ABNORMAL HIGH (ref <18)
Troponin I (High Sensitivity): 137 ng/L (ref <18)
Troponin I (High Sensitivity): 298 ng/L (ref <18)

## 2023-05-21 LAB — SARS CORONAVIRUS 2 BY RT PCR: SARS Coronavirus 2 by RT PCR: NEGATIVE

## 2023-05-21 LAB — BRAIN NATRIURETIC PEPTIDE: B Natriuretic Peptide: 147.1 pg/mL — ABNORMAL HIGH (ref 0.0–100.0)

## 2023-05-21 LAB — LACTIC ACID, PLASMA
Lactic Acid, Venous: 2.1 mmol/L (ref 0.5–1.9)
Lactic Acid, Venous: 1.8 mmol/L (ref 0.5–1.9)

## 2023-05-21 LAB — TSH: TSH: 0.911 u[IU]/mL (ref 0.350–4.500)

## 2023-05-21 LAB — MAGNESIUM: Magnesium: 2 mg/dL (ref 1.7–2.4)

## 2023-05-21 MED ORDER — SODIUM CHLORIDE 0.9 % IV BOLUS
250.0000 mL | Freq: Once | INTRAVENOUS | Status: AC
  Administered 2023-05-21: 250 mL via INTRAVENOUS

## 2023-05-21 MED ORDER — METOPROLOL TARTRATE 5 MG/5ML IV SOLN
INTRAVENOUS | Status: AC
  Filled 2023-05-21: qty 5

## 2023-05-21 MED ORDER — CAMPHOR-MENTHOL 0.5-0.5 % EX LOTN: 1.0000 | TOPICAL_LOTION | Freq: Three times a day (TID) | CUTANEOUS | Status: DC | PRN

## 2023-05-21 MED ORDER — HYDROXYZINE HCL 25 MG PO TABS: 25.0000 mg | ORAL_TABLET | Freq: Three times a day (TID) | ORAL | Status: DC | PRN

## 2023-05-21 MED ORDER — ORAL CARE MOUTH RINSE: 15.0000 mL | OROMUCOSAL | Status: DC | PRN

## 2023-05-21 MED ORDER — ACETAMINOPHEN 325 MG PO TABS: 650.0000 mg | ORAL_TABLET | Freq: Four times a day (QID) | ORAL | Status: DC | PRN

## 2023-05-21 MED ORDER — SEVELAMER CARBONATE 800 MG PO TABS
3200.0000 mg | ORAL_TABLET | Freq: Three times a day (TID) | ORAL | Status: DC
  Administered 2023-05-22 – 2023-05-25 (×9): 3200 mg via ORAL
  Filled 2023-05-21 (×10): qty 4

## 2023-05-21 MED ORDER — METOPROLOL TARTRATE 5 MG/5ML IV SOLN: 5.0000 mg | INTRAVENOUS | Status: DC | PRN

## 2023-05-21 MED ORDER — NA SULFATE-K SULFATE-MG SULF 17.5-3.13-1.6 GM/177ML PO SOLN: 1.0000 | ORAL | Status: DC

## 2023-05-21 MED ORDER — ADENOSINE 6 MG/2ML IV SOLN
INTRAVENOUS | Status: AC
  Administered 2023-05-21: 6 mg
  Filled 2023-05-21: qty 4

## 2023-05-21 MED ORDER — NEPRO/CARBSTEADY PO LIQD: 237.0000 mL | Freq: Three times a day (TID) | ORAL | Status: DC | PRN

## 2023-05-21 MED ORDER — ONDANSETRON HCL 4 MG/2ML IJ SOLN: 4.0000 mg | Freq: Four times a day (QID) | INTRAMUSCULAR | Status: DC | PRN

## 2023-05-21 MED ORDER — ADENOSINE 6 MG/2ML IV SOLN
INTRAVENOUS | Status: AC
  Administered 2023-05-21: 6 mg via INTRAVENOUS
  Filled 2023-05-21: qty 4

## 2023-05-21 MED ORDER — METOPROLOL TARTRATE 5 MG/5ML IV SOLN
5.0000 mg | Freq: Once | INTRAVENOUS | Status: AC
  Administered 2023-05-21: 2.5 mg via INTRAVENOUS

## 2023-05-21 MED ORDER — CALCIUM CARBONATE ANTACID 1250 MG/5ML PO SUSP: 500.0000 mg | Freq: Four times a day (QID) | ORAL | Status: DC | PRN

## 2023-05-21 MED ORDER — ADENOSINE 6 MG/2ML IV SOLN
INTRAVENOUS | Status: AC
  Administered 2023-05-21: 6 mg
  Filled 2023-05-21: qty 2

## 2023-05-21 MED ORDER — CINACALCET HCL 30 MG PO TABS
60.0000 mg | ORAL_TABLET | Freq: Every day | ORAL | Status: DC
  Administered 2023-05-22 – 2023-05-24 (×3): 60 mg via ORAL
  Filled 2023-05-21 (×5): qty 2

## 2023-05-21 MED ORDER — HYDROCORTISONE ACETATE 25 MG RE SUPP: 25.0000 mg | Freq: Every evening | RECTAL | Status: DC | PRN

## 2023-05-21 MED ORDER — ADENOSINE 6 MG/2ML IV SOLN: 6.0000 mg | Freq: Once | INTRAVENOUS | Status: AC

## 2023-05-21 MED ORDER — DOCUSATE SODIUM 283 MG RE ENEM: 1.0000 | ENEMA | RECTAL | Status: DC | PRN

## 2023-05-21 MED ORDER — SORBITOL 70 % SOLN: 30.0000 mL | Status: DC | PRN

## 2023-05-21 MED ORDER — ACETAMINOPHEN 650 MG RE SUPP: 650.0000 mg | Freq: Four times a day (QID) | RECTAL | Status: DC | PRN

## 2023-05-21 MED ORDER — IOHEXOL 350 MG/ML SOLN
100.0000 mL | Freq: Once | INTRAVENOUS | Status: AC | PRN
  Administered 2023-05-21: 100 mL via INTRAVENOUS

## 2023-05-21 MED ORDER — HEPARIN SODIUM (PORCINE) 5000 UNIT/ML IJ SOLN
5000.0000 [IU] | Freq: Three times a day (TID) | INTRAMUSCULAR | Status: DC
  Administered 2023-05-21 – 2023-05-24 (×7): 5000 [IU] via SUBCUTANEOUS
  Filled 2023-05-21 (×7): qty 1

## 2023-05-21 MED ORDER — ONDANSETRON HCL 4 MG PO TABS: 4.0000 mg | ORAL_TABLET | Freq: Four times a day (QID) | ORAL | Status: DC | PRN

## 2023-05-21 MED ORDER — SODIUM CHLORIDE 0.9 % IV BOLUS
500.0000 mL | Freq: Once | INTRAVENOUS | Status: AC
  Administered 2023-05-21: 500 mL via INTRAVENOUS

## 2023-05-21 MED ORDER — METOPROLOL TARTRATE 12.5 MG HALF TABLET
12.5000 mg | ORAL_TABLET | Freq: Two times a day (BID) | ORAL | Status: DC
  Administered 2023-05-22 – 2023-05-24 (×7): 12.5 mg via ORAL
  Filled 2023-05-21 (×9): qty 1

## 2023-05-21 NOTE — Progress Notes (Signed)
TRH night cross cover note:   I was notified by RN that this patient is admitted with SVT, with initial heart rates in the 180s, has arrived from [place], with current sustained heart rates in the 110s.  RN conveys that there are currently no orders for prn AV nodal blockers.  I subsequently added prn IV Lopressor for sustained heart rates greater than 130 bpm.    Newton Pigg, DO Hospitalist

## 2023-05-21 NOTE — Consult Note (Addendum)
Cardiology Consultation   Patient ID: BRALON CIMA MRN: [id number]; DOB: 06-Aug-1966  Admit date: 05/21/2023 Date of Consult: 05/21/2023  PCP:  [name], MD    HeartCare Providers Cardiologist:  None        Patient Profile:   Devin Bullock is a 57 y.o. male with a hx of non-muscle invasive bladder cancer, high risk adenocarcinoma of prostate, ESRD on IHD, chronic heart failure with reduced ejection fraction (EF 45-50%), and dilated cardiomyopathy who is being seen 05/21/2023 for the evaluation of tachycardia at the request of hospital medicine.  History of Present Illness:   Devin Bullock states that he went to dialysis today and completed the session. At the end of the session, he was told his heart rate was in the 190s. He presented to ED and was given 6mg  of adenosine and converted to ST. Patient transferred to hospital medicine for further care. While admitted, patient went into SVT again at a rate of 170s. He was given adenosine 6mg  and metoprolol IV 2.5mg  and converted to ST. While in SVT, patient's blood pressured dropped to 90s/70s, but improved once in ST.   Patient denies any chest pain, dyspnea, or palpitations. His main complaint is a chronic productive cough for the past few weeks.   Labs notable for potassium of 3.6, magnesium 2.0, troponin 91--> 137, BNP 147, and TSH unremarkable.   Review of systems positive for hematuria, bright blood per rectum, cough, and abdominal pain.    Past Medical History:  Diagnosis Date   Anemia    AV fistula (HCC)    Cancer (HCC)    prostate and bladder cancer - 2022 diagnosed   CHF (congestive heart failure) (HCC)    patient denies   Chronic kidney disease    Dialysis patient [facility])    Enlarged heart    History of blood transfusion 03/2020   Hypertension    history of, no longer taking medication at this time   Morbid obesity (HCC)    Parathyroid disorder [facility])     Past Surgical History:  Procedure Laterality Date    AV FISTULA PLACEMENT Left 05/24/2014   Procedure: BRACHIOCEPHALIC ARTERIOVENOUS (AV) FISTULA CREATION;  Surgeon: Chuck Hint, MD;  [facility] OR;  Service: Vascular;  Laterality: Left;   ESOPHAGOGASTRODUODENOSCOPY (EGD) WITH PROPOFOL N/A 04/03/2020   Procedure: ESOPHAGOGASTRODUODENOSCOPY (EGD) WITH PROPOFOL;  Surgeon: Kerin Salen, MD;  Location: [facility] ENDOSCOPY;  Service: Gastroenterology;  Laterality: N/A;   PROSTATE BIOPSY N/A 11/27/2020   Procedure: BIOPSY TRANSRECTAL ULTRASONIC PROSTATE (TUBP);  Surgeon: Crista Elliot, MD;  Location: [facility] ORS;  Service: Urology;  Laterality: N/A;  REQUESTING 73 MINS FOR CASE   REVISON OF ARTERIOVENOUS FISTULA Left 01/08/2021   Procedure: REVISON PLICATION OF ARTERIOVENOUS FISTULA LEFT;  Surgeon: Maeola Harman, MD;  Location: [facility] OR;  Service: Vascular;  Laterality: Left;   TRANSURETHRAL RESECTION OF BLADDER TUMOR N/A 11/27/2020   Procedure: TRANSURETHRAL RESECTION OF BLADDER TUMOR (TURBT);  Surgeon: Crista Elliot, MD;  Location: [facility] ORS;  Service: Urology;  Laterality: N/A;  NEED PARALYSIS     Home Medications:  Prior to Admission medications   Medication Sig Start Date End Date Taking? Authorizing Provider  cinacalcet (SENSIPAR) 60 MG tablet Take 60 mg by mouth daily.    [provider]  hydrocortisone (ANUSOL-HC) 25 MG suppository Place 1 suppository (25 mg total) rectally at bedtime. 04/26/23   Esterwood, Amy S, PA-C  Methoxy PEG-Epoetin Beta (MIRCERA IJ) Mircera 03/23/23  03/21/24  [provider]  Na Sulfate-K Sulfate-Mg Sulf (SUPREP BOWEL PREP KIT) 17.5-3.13-1.6 GM/177ML SOLN Take 1 kit by mouth as directed. 04/26/23   Esterwood, Amy S, PA-C  RENAGEL 800 MG tablet Take 1,600-3,200 mg by mouth See admin instructions. Take 3200 mg with each meal and 1600 mg with each snack 02/01/18   [provider]  sucroferric oxyhydroxide (VELPHORO) 500 MG chewable tablet Chew 1,000-2,000 mg by mouth 3 (three) times daily  with meals. Patient not taking: Reported on 04/26/2023    [provider]    Inpatient Medications: Scheduled Meds:  metoprolol tartrate       [START ON 05/22/2023] cinacalcet  60 mg Oral Q breakfast   heparin  5,000 Units Subcutaneous Q8H   [START ON 05/22/2023] sevelamer carbonate  3,200 mg Oral TID WC   Continuous Infusions:  PRN Meds: metoprolol tartrate, acetaminophen **OR** acetaminophen, calcium carbonate (dosed in mg elemental calcium), camphor-menthol **AND** hydrOXYzine, docusate sodium, feeding supplement (NEPRO CARB STEADY), hydrocortisone, metoprolol tartrate, ondansetron **OR** ondansetron (ZOFRAN) IV, mouth rinse, sorbitol  Allergies:   No Known Allergies  Social History:   Social History   Socioeconomic History   Marital status: Single    Spouse name: Not on file   Number of children: 2   Years of education: Not on file   Highest education level: Not on file  Occupational History   Occupation: disable  Tobacco Use   Smoking status: Never    Passive exposure: Never   Smokeless tobacco: Never  Vaping Use   Vaping status: Never Used  Substance and Sexual Activity   Alcohol use: No   Drug use: No   Sexual activity: Never  Other Topics Concern   Not on file  Social History Narrative   Lives alone.    Social Determinants of Health   Financial Resource Strain: Not on file  Food Insecurity: No Food Insecurity (07/28/2021)   Received from [facility] visits prior to 10/16/2022., [facility] visits prior to 10/16/2022.   Hunger Vital Sign    Worried About Programme researcher, broadcasting/film/video in the Last Year: Never true    Ran Out of Food in the Last Year: Never true  Transportation Needs: Not on file  Physical Activity: Not on file  Stress: Not on file  Social Connections: Not on file  Intimate Partner Violence: Not on file    Family History:    Family History  Problem Relation Age of Onset   Diabetes Mother     Hypertension Mother    Heart attack Mother 48   Stroke Father    Hypertension Father    Depression Father    Diabetes Brother    Hypertension Brother    Stroke Brother 48   Alcohol abuse Brother    Asthma Brother    Colon cancer Neg Hx    Esophageal cancer Neg Hx    Stomach cancer Neg Hx      ROS:  Please see the history of present illness.   All other ROS reviewed and negative.     Physical Exam/Data:   Vitals:   05/21/23 1900 05/21/23 1930 05/21/23 2041 05/21/23 2130  BP: (!) 144/90 128/73 132/79 90/70  Pulse: (!) 113 (!) 103 (!) 114   Resp: (!) 24 (!) 26 18   Temp:   99 F (37.2 C)   TempSrc:   Oral   SpO2: 98% 93% 98%   Weight:   (!) 136.6 kg  Height:   6\' 4"  (1.93 m)     Intake/Output Summary (Last 24 hours) at 05/21/2023 2217 Last data filed at 05/21/2023 2117 Gross per 24 hour  Intake 981.96 ml  Output --  Net 981.96 ml      05/21/2023    8:41 PM 05/21/2023   12:54 PM 04/26/2023   11:24 AM  Last 3 Weights  Weight (lbs) 301 lb 2.4 oz 298 lb 4.5 oz 308 lb  Weight (kg) 136.6 kg 135.3 kg 139.708 kg     Body mass index is 36.66 kg/m.  General:  Well nourished, well developed, in no acute distress HEENT: normal Neck: no JVD, goiter  Vascular: No carotid bruits; Distal pulses 2+ bilaterally Cardiac:  Tachycardic, regular rhythm, normal S1 and S2  Lungs:  Coughing during exam, symmetric chest rise, decreased breath sounds at bases  Abd: Obese, BS+, tender at RLQ, no guarding, no rebound   Ext: Trace edema Musculoskeletal:  No deformities, BUE and BLE strength normal and equal Skin: warm and dry  Neuro:  CNs 2-12 intact, no focal abnormalities noted Psych:  Normal affect   EKG:  The EKG was personally reviewed and demonstrates:  SVT, repeat NSR Telemetry:  Telemetry was personally reviewed and demonstrates:  SVT, repeat NSR  Relevant CV Studies: 04/13/2013 TTE Study Conclusions   - Left ventricle: The cavity size was severely dilated. Wall     thickness was increased in a pattern of severe LVH.    Systolic function was mildly reduced. The estimated    ejection fraction was in the range of 45% to 50%. Wall    motion was normal; there were no regional wall motion    abnormalities. There was fusion of early and atrial    contributions to ventricular filling.  - Aortic valve: Trivial regurgitation.  - Mitral valve: Mild regurgitation.  - Left atrium: The atrium was severely dilated.  - Right atrium: The atrium was moderately dilated.  - Pericardium, extracardiac: A moderate, free-flowing    pericardial effusion was identified circumferential to the    heart. There was no evidence of hemodynamic compromise.  Transthoracic echocardiography.  M-mode, complete 2D,  spectral Doppler, and color Doppler.  Height:  Height:  193cm. Height: 76in.  Weight:  Weight: 153.2kg. Weight:  337lb.  Body mass index:  BMI: 41.1kg/m^2.  Body surface  area:    BSA: 2.49m^2.  Blood pressure:     175/105.  Patient status:  Inpatient.  Location:  ICU/CCU   Laboratory Data:  High Sensitivity Troponin:   Recent Labs  Lab 05/21/23 1321 05/21/23 1500 05/21/23 1830  TROPONINIHS 91* 137* 298*     Chemistry Recent Labs  Lab 05/21/23 1321  NA 136  K 3.6  CL 93*  CO2 29  GLUCOSE 159*  BUN 29*  CREATININE 6.58*  CALCIUM 8.9  MG 2.0  GFRNONAA 9*  ANIONGAP 14    No results for input(s): "PROT", "ALBUMIN", "AST", "ALT", "ALKPHOS", "BILITOT" in the last 168 hours. Lipids No results for input(s): "CHOL", "TRIG", "HDL", "LABVLDL", "LDLCALC", "CHOLHDL" in the last 168 hours.  Hematology Recent Labs  Lab 05/21/23 1321  WBC 10.0  RBC 3.17*  HGB 10.0*  HCT 30.0*  MCV 94.6  MCH 31.5  MCHC 33.3  RDW 15.6*  PLT 161   Thyroid  Recent Labs  Lab 05/21/23 1548  TSH 0.911    BNP Recent Labs  Lab 05/21/23 1321  BNP 147.1*    DDimer No results for input(s): "DDIMER" in  the last 168 hours.   Radiology/Studies:  CT Angio Chest PE W and/or  Wo Contrast  Result Date: 05/21/2023 CLINICAL DATA:  Tachycardia and palpitations beginning during dialysis today. Dyspnea. High probability for pulmonary embolism. EXAM: CT ANGIOGRAPHY CHEST WITH CONTRAST TECHNIQUE: Multidetector CT imaging of the chest was performed using the standard protocol during bolus administration of intravenous contrast. Multiplanar CT image reconstructions and MIPs were obtained to evaluate the vascular anatomy. RADIATION DOSE REDUCTION: This exam was performed according to the departmental dose-optimization program which includes automated exposure control, adjustment of the mA and/or kV according to patient size and/or use of iterative reconstruction technique. CONTRAST:  OMNIPAQUE IOHEXOL 350 MG/ML SOLN COMPARISON:  None Available. FINDINGS: Cardiovascular: Satisfactory opacification of pulmonary arteries noted, and no pulmonary emboli identified. No evidence of thoracic aortic dissection or aneurysm. Mild pericardial effusion versus thickening. Mediastinum/Nodes: No pathologically enlarged lymph nodes identified. Diffuse goiter is seen with multiple thyroid nodules measuring up to at least 3 cm in size. Lungs/Pleura: Elevated left hemidiaphragm. Left lower lung atelectasis versus scarring. No pulmonary mass, infiltrate, or effusion. Upper abdomen: No acute findings. Musculoskeletal: No suspicious bone lesions identified. Review of the MIP images confirms the above findings. IMPRESSION: No evidence of pulmonary embolism. Elevated left hemidiaphragm, with left lower lung atelectasis versus scarring. Mild pericardial effusion versus thickening. Diffuse goiter with multiple thyroid nodules measuring up to at least 3 cm in size. Recommend thyroid US. (ref: J Am Coll Radiol. 2015 Feb;12(2): 143-50). Electronically Signed   By: Danae Orleans M.D.   On: 05/21/2023 15:11   DG Chest Portable 1 View  Result Date: 05/21/2023 CLINICAL DATA:  SVT. EXAM: PORTABLE CHEST 1 VIEW COMPARISON:   03/16/2023 x-ray FINDINGS: Overlapping cardiac leads and defibrillator pads. Enlarged heart. Mild basilar atelectasis on the left. No consolidation, pneumothorax or effusion. No edema. Elevation left hemidiaphragm. Note is made of calcifications in the low neck soft tissues. Unchanged from previous. IMPRESSION: Enlarged heart. No edema. Left basilar atelectasis. Slight elevation of the left hemidiaphragm. Electronically Signed   By: Karen Kays M.D.   On: 05/21/2023 14:24     Assessment and Plan:   SVT- adenosine sensitive Sinus tachycardia LAFB Type 2 NSTEMI-- supply demand mismatch in setting of SVT  Chronic systolic heart failure, ejection fraction 45 to 50% Dilated cardiomyopathy (LV severely dilated) ESRD GI bleed--patient has plans for outpatient colonoscopy  - Oral metoprolol tartrate 12.5 mg twice daily, uptitrate as needed - Echocardiogram (last echocardiogram was 10 years ago)  - Potassium above 4, magnesium above 2 - Trend troponin till peak  - Telemetry    For questions or updates, please contact [facility] Please consult [email or web address] for contact info under    Signed, Lavonia Dana MD [facility] 05/21/2023 10:17 PM

## 2023-05-21 NOTE — Progress Notes (Signed)
Report from Dr. Renaye Rakers at [facility] ER:  Report from [facility]:  Dialysis patient. Needs [place]. T-T-S . Competed HD today. Went into SVT during HD . HR was 190. On EKG 190 on arrival s.p adenosine with cardioversion. Still 110-120 sinus . Usually 90s. S/p fluid bolus. S.p CT PE study. Multiple thyroid nodules. Pending TSH. ?some symptoms of sob on arrival. Currnetly asymptoatmic.  _________________________________  Labs on Admission:  Results for orders placed or performed during the hospital encounter of 05/21/23 (from the past 24 hour(s))  CBC     Status: Abnormal   Collection Time: 05/21/23  1:21 PM  Result Value Ref Range   WBC 10.0 4.0 - 10.5 K/uL   RBC 3.17 (L) 4.22 - 5.81 MIL/uL   Hemoglobin 10.0 (L) 13.0 - 17.0 g/dL   HCT 09.8 (L) 11.9 - 14.7 %   MCV 94.6 80.0 - 100.0 fL   MCH 31.5 26.0 - 34.0 pg   MCHC 33.3 30.0 - 36.0 g/dL   RDW 82.9 (H) 56.2 - 13.0 %   Platelets 161 150 - 400 K/uL   nRBC 0.0 0.0 - 0.2 %  Basic metabolic panel     Status: Abnormal   Collection Time: 05/21/23  1:21 PM  Result Value Ref Range   Sodium 136 135 - 145 mmol/L   Potassium 3.6 3.5 - 5.1 mmol/L   Chloride 93 (L) 98 - 111 mmol/L   CO2 29 22 - 32 mmol/L   Glucose, Bld 159 (H) 70 - 99 mg/dL   BUN 29 (H) 6 - 20 mg/dL   Creatinine, Ser 8.65 (H) 0.61 - 1.24 mg/dL   Calcium 8.9 8.9 - 78.4 mg/dL   GFR, Estimated 9 (L) >60 mL/min   Anion gap 14 5 - 15  Magnesium     Status: None   Collection Time: 05/21/23  1:21 PM  Result Value Ref Range   Magnesium 2.0 1.7 - 2.4 mg/dL  Troponin I (High Sensitivity)     Status: Abnormal   Collection Time: 05/21/23  1:21 PM  Result Value Ref Range   Troponin I (High Sensitivity) 91 (H) <18 ng/L  Brain natriuretic peptide     Status: Abnormal   Collection Time: 05/21/23  1:21 PM  Result Value Ref Range   B Natriuretic Peptide 147.1 (H) 0.0 - 100.0 pg/mL  Lactic acid, plasma     Status: Abnormal   Collection Time: 05/21/23  3:00 PM  Result Value Ref  Range   Lactic Acid, Venous 2.1 (HH) 0.5 - 1.9 mmol/L   Basic Metabolic Panel: Recent Labs  Lab 05/21/23 1321  NA 136  K 3.6  CL 93*  CO2 29  GLUCOSE 159*  BUN 29*  CREATININE 6.58*  CALCIUM 8.9  MG 2.0   Liver Function Tests: No results for input(s): "AST", "ALT", "ALKPHOS", "BILITOT", "PROT", "ALBUMIN" in the last 168 hours. No results for input(s): "LIPASE", "AMYLASE" in the last 168 hours. No results for input(s): "AMMONIA" in the last 168 hours. CBC: Recent Labs  Lab 05/21/23 1321  WBC 10.0  HGB 10.0*  HCT 30.0*  MCV 94.6  PLT 161   Cardiac Enzymes: Recent Labs  Lab 05/21/23 1321  TROPONINIHS 91*    BNP (last 3 results) No results for input(s): "PROBNP" in the last 8760 hours. CBG: No results for input(s): "GLUCAP" in the last 168 hours.  Radiological Exams on Admission:  CT Angio Chest PE W and/or Wo Contrast  Result Date: 05/21/2023 CLINICAL DATA:  Tachycardia and palpitations beginning during dialysis today. Dyspnea. High probability for pulmonary embolism. EXAM: CT ANGIOGRAPHY CHEST WITH CONTRAST TECHNIQUE: Multidetector CT imaging of the chest was performed using the standard protocol during bolus administration of intravenous contrast. Multiplanar CT image reconstructions and MIPs were obtained to evaluate the vascular anatomy. RADIATION DOSE REDUCTION: This exam was performed according to the departmental dose-optimization program which includes automated exposure control, adjustment of the mA and/or kV according to patient size and/or use of iterative reconstruction technique. CONTRAST:  OMNIPAQUE IOHEXOL 350 MG/ML SOLN COMPARISON:  None Available. FINDINGS: Cardiovascular: Satisfactory opacification of pulmonary arteries noted, and no pulmonary emboli identified. No evidence of thoracic aortic dissection or aneurysm. Mild pericardial effusion versus thickening. Mediastinum/Nodes: No pathologically enlarged lymph nodes identified. Diffuse goiter is  seen with multiple thyroid nodules measuring up to at least 3 cm in size. Lungs/Pleura: Elevated left hemidiaphragm. Left lower lung atelectasis versus scarring. No pulmonary mass, infiltrate, or effusion. Upper abdomen: No acute findings. Musculoskeletal: No suspicious bone lesions identified. Review of the MIP images confirms the above findings. IMPRESSION: No evidence of pulmonary embolism. Elevated left hemidiaphragm, with left lower lung atelectasis versus scarring. Mild pericardial effusion versus thickening. Diffuse goiter with multiple thyroid nodules measuring up to at least 3 cm in size. Recommend thyroid US. (ref: J Am Coll Radiol. 2015 Feb;12(2): 143-50). Electronically Signed   By: Danae Orleans M.D.   On: 05/21/2023 15:11   DG Chest Portable 1 View  Result Date: 05/21/2023 CLINICAL DATA:  SVT. EXAM: PORTABLE CHEST 1 VIEW COMPARISON:  03/16/2023 x-ray FINDINGS: Overlapping cardiac leads and defibrillator pads. Enlarged heart. Mild basilar atelectasis on the left. No consolidation, pneumothorax or effusion. No edema. Elevation left hemidiaphragm. Note is made of calcifications in the low neck soft tissues. Unchanged from previous. IMPRESSION: Enlarged heart. No edema. Left basilar atelectasis. Slight elevation of the left hemidiaphragm. Electronically Signed   By: Karen Kays M.D.   On: 05/21/2023 14:24

## 2023-05-21 NOTE — ED Notes (Signed)
RN assisted with transport to CT.

## 2023-05-21 NOTE — H&P (Incomplete)
History and Physical    Patient: Devin Bullock XBM:[id number] DOB: 05/28/1966 DOA: 05/21/2023 DOS: the patient was seen and examined on 05/21/2023 PCP: [name], MD  Patient coming from: {Point_of_Origin:[address]}  Chief Complaint:  Chief Complaint  Patient presents with   Tachycardia   HPI: Devin Bullock is a 57 y.o. male with medical history significant of ***  Review of Systems: {ROS_Text:26778} Past Medical History:  Diagnosis Date   Anemia    AV fistula (HCC)    Cancer (HCC)    prostate and bladder cancer - 2022 diagnosed   CHF (congestive heart failure) (HCC)    patient denies   Chronic kidney disease    Dialysis patient (HCC)    Enlarged heart    History of blood transfusion 03/2020   Hypertension    history of, no longer taking medication at this time   Morbid obesity (HCC)    Parathyroid disorder [facility])    Past Surgical History:  Procedure Laterality Date   AV FISTULA PLACEMENT Left 05/24/2014   Procedure: BRACHIOCEPHALIC ARTERIOVENOUS (AV) FISTULA CREATION;  Surgeon: Chuck Hint, MD;  Location: [facility] OR;  Service: Vascular;  Laterality: Left;   ESOPHAGOGASTRODUODENOSCOPY (EGD) WITH PROPOFOL N/A 04/03/2020   Procedure: ESOPHAGOGASTRODUODENOSCOPY (EGD) WITH PROPOFOL;  Surgeon: Kerin Salen, MD;  Location: [facility] ENDOSCOPY;  Service: Gastroenterology;  Laterality: N/A;   PROSTATE BIOPSY N/A 11/27/2020   Procedure: BIOPSY TRANSRECTAL ULTRASONIC PROSTATE (TUBP);  Surgeon: Crista Elliot, MD;  Location: [facility] ORS;  Service: Urology;  Laterality: N/A;  REQUESTING 107 MINS FOR CASE   REVISON OF ARTERIOVENOUS FISTULA Left 01/08/2021   Procedure: REVISON PLICATION OF ARTERIOVENOUS FISTULA LEFT;  Surgeon: Maeola Harman, MD;  Location: [facility] OR;  Service: Vascular;  Laterality: Left;   TRANSURETHRAL RESECTION OF BLADDER TUMOR N/A 11/27/2020   Procedure: TRANSURETHRAL RESECTION OF BLADDER TUMOR (TURBT);  Surgeon: Crista Elliot, MD;  Location: [facility] ORS;  Service:  Urology;  Laterality: N/A;  NEED PARALYSIS   Social History:  reports that he has never smoked. He has never been exposed to tobacco smoke. He has never used smokeless tobacco. He reports that he does not drink alcohol and does not use drugs.  No Known Allergies  Family History  Problem Relation Age of Onset   Diabetes Mother    Hypertension Mother    Heart attack Mother 21   Stroke Father    Hypertension Father    Depression Father    Diabetes Brother    Hypertension Brother    Stroke Brother 1   Alcohol abuse Brother    Asthma Brother    Colon cancer Neg Hx    Esophageal cancer Neg Hx    Stomach cancer Neg Hx     Prior to Admission medications   Medication Sig Start Date End Date Taking? Authorizing Provider  cinacalcet (SENSIPAR) 60 MG tablet Take 60 mg by mouth daily.    [provider]  hydrocortisone (ANUSOL-HC) 25 MG suppository Place 1 suppository (25 mg total) rectally at bedtime. 04/26/23   Esterwood, Amy S, PA-C  Methoxy PEG-Epoetin Beta (MIRCERA IJ) Mircera 03/23/23 03/21/24  [provider]  Na Sulfate-K Sulfate-Mg Sulf (SUPREP BOWEL PREP KIT) 17.5-3.13-1.6 GM/177ML SOLN Take 1 kit by mouth as directed. 04/26/23   Esterwood, Amy S, PA-C  RENAGEL 800 MG tablet Take 1,600-3,200 mg by mouth See admin instructions. Take 3200 mg with each meal and 1600 mg with each snack 02/01/18   [provider]  sucroferric oxyhydroxide (  VELPHORO) 500 MG chewable tablet Chew 1,000-2,000 mg by mouth 3 (three) times daily with meals. Patient not taking: Reported on 04/26/2023    [provider]    Physical Exam: Vitals:   05/21/23 1649 05/21/23 1900 05/21/23 1930 05/21/23 2041  BP: 134/84 (!) 144/90 128/73 132/79  Pulse: (!) 106 (!) 113 (!) 103 (!) 114  Resp: 20 (!) 24 (!) 26 18  Temp: 99.3 F (37.4 C)   99 F (37.2 C)  TempSrc: Oral   Oral  SpO2: 99% 98% 93% 98%  Weight:    (!) 136.6 kg  Height:    6\' 4"  (1.93 m)   *** Data Reviewed: {Tip this  will not be part of the note when signed- Document your independent interpretation of telemetry tracing, EKG, lab, Radiology test or any other diagnostic tests. Add any new diagnostic test ordered today. (Optional):26781} {Results:26384}  Assessment and Plan: No notes have been filed under this hospital service. Service: Hospitalist     Advance Care Planning:   Code Status: Full Code ***  Consults: ***  Family Communication: ***  Severity of Illness: {Observation/Inpatient:21159}  AuthorLonia Blood, MD 05/21/2023 8:51 PM  For on call review [email or web address].

## 2023-05-21 NOTE — ED Notes (Signed)
[facility] aware of patient bed.

## 2023-05-21 NOTE — ED Provider Notes (Signed)
57 yo male on dialysis Here with SVT, converted with adenosine, HR 110's after cardioversion  Physical Exam  BP 129/84   Pulse (!) 111   Temp 98.1 F (36.7 C) (Oral)   Resp 18   Wt 135.3 kg   SpO2 97%   BMI 36.31 kg/m   Physical Exam  Procedures  Procedures  ED Course / MDM   Clinical Course as of 05/21/23 1539  Sat May 21, 2023  1311 Called to bedside by nursing staff for SVT.  Patient is 57 ESRD had full session noted to be tachycardic and sent here for further evaluation.  Patient has had cough for the past week, but chest pain, lightheadedness worsening shortness of breath.  Appears to be SVT at a rate of 195.  Chemical cardioversion with 6 mg of adenosine to sinus tachycardia at a rate of 128.  Patient notes that he has had rectal bleeding and bleeding from penis over the past several days.  Has upcoming colonoscopy.  Patient denies prior cardiac history.  Denies any history of palpitations, A-fib, SVT. [TY]  1358 CBC(!) Stable anemia [TY]  1358 Basic metabolic panel(!) No significant metabolic derangements.  Creatinine function consistent with ESRD. [TY]  1359 Magnesium: 2.0 [TY]  1433 Patient remains persistently tachycardic.  Heart rate in the in the 1 teens.  Appears to be normal sinus rhythm on my independent interpretation of the monitor.  Patient remains asymptomatic.  [TY]  1525 CT Angio Chest PE W and/or Wo Contrast IMPRESSION: No evidence of pulmonary embolism.  Elevated left hemidiaphragm, with left lower lung atelectasis versus scarring.  Mild pericardial effusion versus thickening.  Diffuse goiter with multiple thyroid nodules measuring up to at least 3 cm in size. Recommend thyroid US. (ref: J Am Coll Radiol. 2015 Feb;12(2): 143-50).   [TY]    Clinical Course User Index [TY] Coral Spikes, DO   Medical Decision Making Amount and/or Complexity of Data Reviewed Labs: ordered. Decision-making details documented in ED Course. Radiology: ordered.  Decision-making details documented in ED Course. ECG/medicine tests: ordered.  Risk Prescription drug management. Decision regarding hospitalization.   Correction to 345 pm ECG - this shows sinus tachycardia, NOT A Flutter per my interpretation - I am unable to change the MUSE record  4 pm - admitted to Dr Phoebe Perch hospitalist at [facility]      Terald Sleeper, MD 05/21/23 [phone]

## 2023-05-21 NOTE — ED Notes (Signed)
CRITICAL VALUE STICKER  CRITICAL VALUE:Troponin  RECEIVER (on-site recipient of call):Carmon Ginsberg, RN  DATE & TIME NOTIFIED:   MESSENGER (representative from lab):  MD NOTIFIED: Trifan  TIME OF NOTIFICATION:1630  RESPONSE:

## 2023-05-21 NOTE — ED Triage Notes (Signed)
Pt presents with reports of rapid HR coming from dialysis. Pt was in the middle of dialysis when they noted he had a HR of 192. Pt denies CP, palpitations, or ShOB. Pt states he currently has a GI bleed and bleeding from his penis.

## 2023-05-21 NOTE — Progress Notes (Signed)
Patient's heart rate went from 110s to 180s at 2123. Patient assessed. Asymptomatic and not feeling any new symptoms. Rapid Burnard Leigh, RN, called. Declined to come to room.  Dr Arlean Hopping paged. Swat nurse Darnelle Spangle, RN, paged, immediately came to room. Dr. Mikeal Hawthorne to room, followed shortly by Dr. Arlean Hopping.    EKG done. Valsalva maneuver attempted 3 x without response. Blood pressure began to respond by dropping, down into 80s systolic at the lowest. Adenosine given with immediate response and heart rate back into 110s at 2136 and blood pressure quickly stabilized back to baseline.

## 2023-05-21 NOTE — ED Provider Notes (Signed)
[place] EMERGENCY DEPARTMENT AT [facility] Provider Note   CSN: [id number] Arrival date & time: 05/21/23  1232     History {Add pertinent medical, surgical, social history, OB history to HPI:1} Chief Complaint  Patient presents with   Tachycardia    Devin Bullock is a 57 y.o. male.  57 year old male presenting emergency department for SVT from dialysis.  Tuesday Thursday Saturday.  Received full session.  Heart rate 198.  See ED course for further HPI        Home Medications Prior to Admission medications   Medication Sig Start Date End Date Taking? Authorizing Provider  cinacalcet (SENSIPAR) 60 MG tablet Take 60 mg by mouth daily.    [provider]  hydrocortisone (ANUSOL-HC) 25 MG suppository Place 1 suppository (25 mg total) rectally at bedtime. 04/26/23   Esterwood, Amy S, PA-C  Methoxy PEG-Epoetin Beta (MIRCERA IJ) Mircera 03/23/23 03/21/24  [provider]  Na Sulfate-K Sulfate-Mg Sulf (SUPREP BOWEL PREP KIT) 17.5-3.13-1.6 GM/177ML SOLN Take 1 kit by mouth as directed. 04/26/23   Esterwood, Amy S, PA-C  RENAGEL 800 MG tablet Take 1,600-3,200 mg by mouth See admin instructions. Take 3200 mg with each meal and 1600 mg with each snack 02/01/18   [provider]  sucroferric oxyhydroxide (VELPHORO) 500 MG chewable tablet Chew 1,000-2,000 mg by mouth 3 (three) times daily with meals. Patient not taking: Reported on 04/26/2023    [provider]      Allergies    Patient has no known allergies.    Review of Systems   Review of Systems  Physical Exam Updated Vital Signs BP 128/84   Pulse (!) 113   Temp 98.1 F (36.7 C) (Oral)   Resp (!) 23   Wt 135.3 kg   SpO2 100%   BMI 36.31 kg/m  Physical Exam Vitals and nursing note reviewed.  Constitutional:      General: He is not in acute distress.    Appearance: He is obese. He is not toxic-appearing.  HENT:     Head: Normocephalic.     Nose: Nose normal.     Mouth/Throat:      Mouth: Mucous membranes are moist.  Eyes:     Conjunctiva/sclera: Conjunctivae normal.  Cardiovascular:     Rate and Rhythm: Regular rhythm. Tachycardia present.     Pulses: Normal pulses.  Pulmonary:     Effort: Pulmonary effort is normal.     Breath sounds: Normal breath sounds. No wheezing, rhonchi or rales.  Abdominal:     General: Abdomen is flat. There is no distension.     Tenderness: There is no abdominal tenderness. There is no guarding or rebound.  Musculoskeletal:        General: Normal range of motion.  Skin:    General: Skin is warm and dry.     Capillary Refill: Capillary refill takes less than 2 seconds.  Neurological:     Mental Status: He is alert and oriented to person, place, and time.  Psychiatric:        Mood and Affect: Mood normal.        Behavior: Behavior normal.     ED Results / Procedures / Treatments   Labs (all labs ordered are listed, but only abnormal results are displayed) Labs Reviewed  CBC - Abnormal; Notable for the following components:      Result Value   RBC 3.17 (*)    Hemoglobin 10.0 (*)    HCT 30.0 (*)  RDW 15.6 (*)    All other components within normal limits  BASIC METABOLIC PANEL - Abnormal; Notable for the following components:   Chloride 93 (*)    Glucose, Bld 159 (*)    BUN 29 (*)    Creatinine, Ser 6.58 (*)    GFR, Estimated 9 (*)    All other components within normal limits  MAGNESIUM  LACTIC ACID, PLASMA  BRAIN NATRIURETIC PEPTIDE  TROPONIN I (HIGH SENSITIVITY)    EKG EKG Interpretation Date/Time:  Saturday May 21 2023 13:08:14 EDT Ventricular Rate:  125 PR Interval:  126 QRS Duration:  104 QT Interval:  316 QTC Calculation: 456 R Axis:   -47  Text Interpretation: Sinus tachycardia Left anterior fascicular block Abnormal R-wave progression, early transition LVH with secondary repolarization abnormality Confirmed by Estanislado Pandy [phone]) on 05/21/2023 1:12:40 PM  Radiology DG Chest Portable 1  View  Result Date: 05/21/2023 CLINICAL DATA:  SVT. EXAM: PORTABLE CHEST 1 VIEW COMPARISON:  03/16/2023 x-ray FINDINGS: Overlapping cardiac leads and defibrillator pads. Enlarged heart. Mild basilar atelectasis on the left. No consolidation, pneumothorax or effusion. No edema. Elevation left hemidiaphragm. Note is made of calcifications in the low neck soft tissues. Unchanged from previous. IMPRESSION: Enlarged heart. No edema. Left basilar atelectasis. Slight elevation of the left hemidiaphragm. Electronically Signed   By: Karen Kays M.D.   On: 05/21/2023 14:24    Procedures Procedures  {Document cardiac monitor, telemetry assessment procedure when appropriate:1}  Medications Ordered in ED Medications  adenosine (ADENOCARD) 6 MG/2ML injection (has no administration in time range)  adenosine (ADENOCARD) 6 MG/2ML injection (6 mg  Given 05/21/23 1305)  sodium chloride 0.9 % bolus 500 mL (500 mLs Intravenous New Bag/Given 05/21/23 1352)  iohexol (OMNIPAQUE) 350 MG/ML injection 100 mL (100 mLs Intravenous Contrast Given 05/21/23 1341)  sodium chloride 0.9 % bolus 250 mL (250 mLs Intravenous New Bag/Given 05/21/23 1500)    ED Course/ Medical Decision Making/ A&P Clinical Course as of 05/21/23 1503  Sat May 21, 2023  1311 Called to bedside by nursing staff for SVT.  Patient is 57 ESRD had full session noted to be tachycardic and sent here for further evaluation.  Patient has had cough for the past week, but chest pain, lightheadedness worsening shortness of breath.  Appears to be SVT at a rate of 195.  Chemical cardioversion with 6 mg of adenosine to sinus tachycardia at a rate of 128.  Patient notes that he has had rectal bleeding and bleeding from penis over the past several days.  Has upcoming colonoscopy.  Patient denies prior cardiac history.  Denies any history of palpitations, A-fib, SVT. [TY]  1358 CBC(!) Stable anemia [TY]  1358 Basic metabolic panel(!) No significant metabolic derangements.   Creatinine function consistent with ESRD. [TY]  1359 Magnesium: 2.0 [TY]  1433 Patient remains persistently tachycardic.  Heart rate in the in the 1 teens.  Appears to be normal sinus rhythm on my independent interpretation of the monitor.  Patient remains asymptomatic.  [TY]    Clinical Course User Index [TY] Coral Spikes, DO   {   Click here for ABCD2, HEART and other calculatorsREFRESH Note before signing :1}                              Medical Decision Making 57 year old male history of ESRD, morbid obesity and documented CHF although he denies present emergency department SVT.  Heart rate in the  190s.  Converted with 6 mg adenosine, has remained in sinus tachycardia.  Lungs clear.  Chest x-ray with some vascular congestion, but no overt pulmonary edema.  Given tachycardia and shortness of breath concern for possible PE.  CTA ordered to evaluate for clot.  Gentle IV fluids given ESRD and documented CHF.  No significant improvement in heart rate as it is remained in the upper 1 teens.  Appears to be sinus rhythm tach.  Patient is essentially symptomatic cough.  He does note that he continues to have bright red blood with bowel movements and some bleeding from the penis.  Per chart review appears this has been ongoing since July and it appears he has outpatient colonoscopy planned on the 17th.  His hemoglobin is stable.  No significant metabolic derangements.  Amount and/or Complexity of Data Reviewed Labs: ordered. Decision-making details documented in ED Course. Radiology: ordered. ECG/medicine tests: ordered.  Risk Prescription drug management.   {Document critical care time when appropriate:1} {Document review of labs and clinical decision tools ie heart score, Chads2Vasc2 etc:1}  {Document your independent review of radiology images, and any outside records:1} {Document your discussion with family members, caretakers, and with consultants:1} {Document social determinants of  health affecting pt's care:1} {Document your decision making why or why not admission, treatments were needed:1} Final Clinical Impression(s) / ED Diagnoses Final diagnoses:  None    Rx / DC Orders ED Discharge Orders     None

## 2023-05-21 NOTE — ED Notes (Signed)
CRITICAL VALUE STICKER  CRITICAL VALUE:lactic 2.1  RECEIVER (on-site recipient of call):Melodye Ped  DATE & TIME NOTIFIED: 10/5/20243:43 PM   MESSENGER (representative from lab):  MD NOTIFIED: matthew trifan,   TIME OF NOTIFICATION:3:44 PM   RESPONSE:

## 2023-05-21 NOTE — ED Notes (Signed)
CL called spoke with RN to adv Bed Ready at [facility] 2C RM#05.-ABB(NS)

## 2023-05-21 NOTE — Progress Notes (Signed)
Patient arrives via [facility] from [place]. Vitals stable with exception of HR in 110s. Patient reports multiple bowel movements last evening with a lot of blood. Also bleeding scant amount from penile meatus. Abdominal tenderness located to right lower quadrant laterally.

## 2023-05-22 ENCOUNTER — Observation Stay (HOSPITAL_BASED_OUTPATIENT_CLINIC_OR_DEPARTMENT_OTHER): Payer: Medicaid Other

## 2023-05-22 ENCOUNTER — Encounter (HOSPITAL_COMMUNITY): Payer: Self-pay | Admitting: Internal Medicine

## 2023-05-22 DIAGNOSIS — I5021 Acute systolic (congestive) heart failure: Secondary | ICD-10-CM

## 2023-05-22 DIAGNOSIS — I471 Supraventricular tachycardia, unspecified: Secondary | ICD-10-CM | POA: Diagnosis not present

## 2023-05-22 LAB — MRSA NEXT GEN BY PCR, NASAL: MRSA by PCR Next Gen: DETECTED — AB

## 2023-05-22 LAB — ECHOCARDIOGRAM COMPLETE
AR max vel: 2.44 cm2
AV Area VTI: 2.08 cm2
AV Area mean vel: 2 cm2
AV Mean grad: 17 mm[Hg]
AV Peak grad: 25.4 mm[Hg]
Ao pk vel: 2.52 m/s
Area-P 1/2: 4.77 cm2
Calc EF: 49 %
Height: 76 in
S' Lateral: 4.5 cm
Single Plane A2C EF: 51.3 %
Single Plane A4C EF: 50.2 %
Weight: 4906.56 [oz_av]

## 2023-05-22 LAB — COMPREHENSIVE METABOLIC PANEL
ALT: 16 U/L (ref 0–44)
AST: 21 U/L (ref 15–41)
Albumin: 3.1 g/dL — ABNORMAL LOW (ref 3.5–5.0)
Alkaline Phosphatase: 78 U/L (ref 38–126)
Anion gap: 13 (ref 5–15)
BUN: 36 mg/dL — ABNORMAL HIGH (ref 6–20)
CO2: 31 mmol/L (ref 22–32)
Calcium: 9.2 mg/dL (ref 8.9–10.3)
Chloride: 90 mmol/L — ABNORMAL LOW (ref 98–111)
Creatinine, Ser: 8.39 mg/dL — ABNORMAL HIGH (ref 0.61–1.24)
GFR, Estimated: 7 mL/min — ABNORMAL LOW (ref >60)
Glucose, Bld: 138 mg/dL — ABNORMAL HIGH (ref 70–99)
Potassium: 4.1 mmol/L (ref 3.5–5.1)
Sodium: 134 mmol/L — ABNORMAL LOW (ref 135–145)
Total Bilirubin: 0.7 mg/dL (ref 0.3–1.2)
Total Protein: 6.9 g/dL (ref 6.5–8.1)

## 2023-05-22 LAB — TROPONIN I (HIGH SENSITIVITY)
Troponin I (High Sensitivity): 396 ng/L (ref <18)
Troponin I (High Sensitivity): 291 ng/L (ref <18)

## 2023-05-22 LAB — HEPATITIS B SURFACE ANTIGEN: Hepatitis B Surface Ag: NONREACTIVE

## 2023-05-22 MED ORDER — CHLORHEXIDINE GLUCONATE CLOTH 2 % EX PADS
6.0000 | MEDICATED_PAD | Freq: Every day | CUTANEOUS | Status: DC
  Administered 2023-05-23 – 2023-05-24 (×2): 6 via TOPICAL

## 2023-05-22 MED ORDER — MUPIROCIN 2 % EX OINT
1.0000 | TOPICAL_OINTMENT | Freq: Two times a day (BID) | CUTANEOUS | Status: DC
  Administered 2023-05-22 – 2023-05-25 (×6): 1 via NASAL
  Filled 2023-05-22: qty 22

## 2023-05-22 MED ORDER — CHLORHEXIDINE GLUCONATE CLOTH 2 % EX PADS
6.0000 | MEDICATED_PAD | Freq: Every day | CUTANEOUS | Status: DC
  Administered 2023-05-22: 6 via TOPICAL

## 2023-05-22 MED ORDER — CALCITRIOL 0.5 MCG PO CAPS
2.0000 ug | ORAL_CAPSULE | ORAL | Status: DC
  Administered 2023-05-23: 2 ug via ORAL
  Filled 2023-05-22 (×3): qty 4

## 2023-05-22 MED ORDER — GUAIFENESIN 100 MG/5ML PO LIQD
5.0000 mL | Freq: Four times a day (QID) | ORAL | Status: DC | PRN
  Administered 2023-05-22 – 2023-05-25 (×11): 5 mL via ORAL
  Filled 2023-05-22 (×4): qty 10
  Filled 2023-05-22: qty 5
  Filled 2023-05-22 (×6): qty 10

## 2023-05-22 MED ORDER — PERFLUTREN LIPID MICROSPHERE
1.0000 mL | INTRAVENOUS | Status: AC | PRN
  Administered 2023-05-22: 4 mL via INTRAVENOUS

## 2023-05-22 NOTE — Progress Notes (Signed)
Echocardiogram 2D Echocardiogram has been performed.  Shireen Rayburn N Lazarus [place],RDCS 05/22/2023, 3:12 PM

## 2023-05-22 NOTE — Progress Notes (Deleted)
PROGRESS NOTE  Devin Bullock  DOB: [birth year]  PCP: [name], MD WUJ:[id number]  DOA: 05/21/2023  LOS: 0 days  Hospital Day: 2  Brief narrative: Devin Bullock is a 57 y.o. male with PMH significant for ESRD-HD-MWF, chronic systolic CHF, obesity, HTN, parathyroid disease, 10/5, patient presented to the [facility] from dialysis with complaint of rapid heart rate.  He was in the middle of dialysis when they noted his heart rate as high as 192.  In the ED, he was given 6 mg of adenosine with improvement heart rate to 128 Seen by cardiology. On review of symptoms, patient stated he had rectal bleeding and hematuria over the last several days.  He was worked up by GI as an outpatient and has a scheduled EGD and colonoscopy in the next week.  For hematuria, he was being seen by urologist as an outpatient.  Labs with WC count 10, hemoglobin 10, platelet normal, troponin 91, lactic acid 2.1, BNP 147 CT angio chest did not show any pulm embolism.  It showed elevated left hemidiaphragm, with left lower lung atelectasis versus scarring.    Accepted for admission to [facility] After arrival to [facility], patient's heart rate went up to 170s.  He was given 6 mg adenosine IV and metoprolol 2.5 mg IV with improvement in heart rate to 110s.  Subjective: Patient was seen and examined this morning.  Middle-aged African-American male. Propped up in bed.  Has intermittent wet/dry cough. Overnight, heart rate improved to low 100s, blood pressure 100s, breathing on room air  Assessment and plan: SVT Sinus tachycardia Adenosine sensitive SVT Seen by cardiology.  Started on metoprolol 12.5 mg twice daily Echocardiogram pending TSH normal Electrolytes normal Recent Labs  Lab 05/21/23 1321 05/22/23 0030  K 3.6 4.1  MG 2.0  --    Elevated troponin Troponin elevated to 97 uptrending to 396 this morning Likely demand ischemia in the setting of tachycardia, ESRD, systolic CHF Cardiology  following  Recent Labs    05/21/23 1321 05/21/23 1500 05/21/23 1830 05/22/23 0030 05/22/23 0951  TROPONINIHS 91* 137* 298* 396* 291*   Chronic systolic CHF  Dilated cardiomyopathy  essential hypertension PTA meds- none Blood pressure in low normal range.  Range  Cough Seems to have significant intermittent dry/wet cough. CT imaging without much edema or consolidation. Started Robitussin DM as needed  ESRD-HD-MWF Patient states that his dialysis schedule is 3 days a week, MWF.  However he was sick and could not go for dialysis on Friday and hence he was at dialysis on Saturday.  He states he completed his dialysis yesterday.  Does not look volume overloaded Nephrologist consultation called  Intermittent hemorrhoidal bleeding Follows with lab for GI as outpatient.  Has plans for outpatient EGD colonoscopy in next 3 week Currently reports intermittent hemorrhoidal bleeding  Hematuria Outpatient urology evaluation in process with his urologist at [facility] Currently has intermittent bleeding from urethral meatus. Inpatient workup may or may not be related.  Will discuss with urology  Anemia of chronic disease Hemoglobin trend as below close to 10, stable, in the setting of ESRD, intermittent GI bleeding and hematuria Continue to monitor Recent Labs    03/16/23 1848 03/16/23 2012 04/26/23 1240 05/21/23 1321 05/21/23 2211  HGB 11.9* 10.7* 10.1* 10.0* 9.5*  MCV 95.4 95.5 95.2 94.6 96.1   Goiter CT chest on admission showed goiter with multiple thyroid nodules measuring up to at least 3 cm in size. Recommend thyroid ultrasound as an outpatient  Obesity  Body mass index is 37.33 kg/m. Patient has been advised to make an attempt to improve diet and exercise patterns to aid in weight loss.  Mobility: Encourage ambulation.  Independently ambulates at home.  Lives alone  Goals of care   Code Status: Full Code     DVT prophylaxis:  heparin injection 5,000 Units Start:  05/21/23 2200   Antimicrobials: None Fluid: None Consultants: Nephrology, cardiology Family Communication: None at bedside  Status: Observation Level of care:  Telemetry Cardiac   Patient is from: Home Needs to continue in-hospital care: May need further cardiac workup Anticipated d/c to: Pending clinical course      Diet:  Diet Order             Diet renal with fluid restriction Fluid restriction: 1200 mL Fluid; Room service appropriate? Yes; Fluid consistency: Thin  Diet effective now                   Scheduled Meds:  Chlorhexidine Gluconate Cloth  6 each Topical Q0600   cinacalcet  60 mg Oral Q breakfast   heparin  5,000 Units Subcutaneous Q8H   metoprolol tartrate  12.5 mg Oral BID   mupirocin ointment  1 Application Nasal BID   sevelamer carbonate  3,200 mg Oral TID WC    PRN meds: acetaminophen **OR** acetaminophen, calcium carbonate (dosed in mg elemental calcium), camphor-menthol **AND** hydrOXYzine, docusate sodium, feeding supplement (NEPRO CARB STEADY), guaiFENesin, hydrocortisone, metoprolol tartrate, ondansetron **OR** ondansetron (ZOFRAN) IV, mouth rinse, sorbitol   Infusions:    Antimicrobials: Anti-infectives (From admission, onward)    None       Objective: Vitals:   05/22/23 1047 05/22/23 1202  BP: 117/73   Pulse: (!) 103   Resp:  19  Temp:  98.4 F (36.9 C)  SpO2:      Intake/Output Summary (Last 24 hours) at 05/22/2023 1444 Last data filed at 05/21/2023 2330 Gross per 24 hour  Intake 1221.96 ml  Output --  Net 1221.96 ml   Filed Weights   05/21/23 1254 05/21/23 2041 05/22/23 0500  Weight: 135.3 kg (!) 136.6 kg (!) 139.1 kg   Weight change:  Body mass index is 37.33 kg/m.   Physical Exam: General exam: Pleasant middle-aged African-American male. Skin: No rashes, lesions or ulcers. HEENT: Atraumatic, normocephalic, no obvious bleeding Lungs: Clear to auscultation bilaterally CVS: Regular rate and rhythm, no  murmur GI/Abd soft, nontender, nondistended, bowel sound present CNS: Alert, awake, oriented x 3 Psychiatry: Mood appropriate Extremities: No pedal edema, no reference  Data Review: I have personally reviewed the laboratory data and studies available.  F/u labs ordered Unresulted Labs (From admission, onward)     Start     Ordered   05/23/23 0500  Basic metabolic panel  Daily,   R      05/22/23 1441   05/23/23 0500  CBC with Differential/Platelet  Daily,   R      05/22/23 1441            Total time spent in review of labs and imaging, patient evaluation, formulation of plan, documentation and communication with family: 55 minutes  Signed, Lorin Glass, MD [facility] 05/22/2023

## 2023-05-22 NOTE — H&P (Incomplete)
History and Physical    Patient: Devin Bullock WUX:[id number] DOB: [birth year] DOA: 05/21/2023 DOS: the patient was seen and examined on 05/22/2023 PCP: [name], MD  Patient coming from: Home  Chief Complaint:  Chief Complaint  Patient presents with  . Tachycardia   HPI: HASKER DUNSTON is a 57 y.o. male with medical history significant of end-stage renal disease on hemodialysis TTS, heart failure with reduced ejection fraction with last echo in 2014, morbid obesity, parathyroid disease, essential hypertension, who was seen at [facility] after going to dialysis where he completed but then found to have rapid heart rate.  He was in the middle of dialysis when they noted the heart rate up to 192.  No chest pain.  Mild shortness of breath.  Patient is currently being worked up for GI bleed and has scheduled EGD and colonoscopy next week.  He is also noted bleeding from his penis.  He is not making urine.  He has had that bleeding from the penis before and was seen by urologist.  Patient's heart rate was about 190 in the ER.  He was initially given adenosine 6 mg.  Heart rate came down to 110 but persisted.  He was subsequently  admitted to the hospitalist service for observation.  On arrival in the unit he had noticed: Heart rate went back to 180s.  Patient received another dose of adenosine and the heart rate came down again to 110.  Lopressor was given.  Plan was to cardiovert patient as systolic blood pressure dropped to 90.  He however responded to the adenosine. Review of Systems: {ROS_Text:26778} Past Medical History:  Diagnosis Date  . Anemia   . AV fistula (HCC)   . Cancer [facility])    prostate and bladder cancer - 2022 diagnosed  . CHF (congestive heart failure) (HCC)    patient denies  . Chronic kidney disease   . Dialysis patient (HCC)   . Enlarged heart   . History of blood transfusion 03/2020  . Hypertension    history of, no longer taking medication at this time  . Morbid  obesity (HCC)   . Parathyroid disorder [facility])    Past Surgical History:  Procedure Laterality Date  . AV FISTULA PLACEMENT Left 05/24/2014   Procedure: BRACHIOCEPHALIC ARTERIOVENOUS (AV) FISTULA CREATION;  Surgeon: Chuck Hint, MD;  Location: [facility] OR;  Service: Vascular;  Laterality: Left;  . ESOPHAGOGASTRODUODENOSCOPY (EGD) WITH PROPOFOL N/A 04/03/2020   Procedure: ESOPHAGOGASTRODUODENOSCOPY (EGD) WITH PROPOFOL;  Surgeon: Kerin Salen, MD;  Location: [facility] ENDOSCOPY;  Service: Gastroenterology;  Laterality: N/A;  . PROSTATE BIOPSY N/A 11/27/2020   Procedure: BIOPSY TRANSRECTAL ULTRASONIC PROSTATE (TUBP);  Surgeon: Crista Elliot, MD;  Location: [facility] ORS;  Service: Urology;  Laterality: N/A;  REQUESTING 91 MINS FOR CASE  . REVISON OF ARTERIOVENOUS FISTULA Left 01/08/2021   Procedure: REVISON PLICATION OF ARTERIOVENOUS FISTULA LEFT;  Surgeon: Maeola Harman, MD;  Location: [facility] OR;  Service: Vascular;  Laterality: Left;  . TRANSURETHRAL RESECTION OF BLADDER TUMOR N/A 11/27/2020   Procedure: TRANSURETHRAL RESECTION OF BLADDER TUMOR (TURBT);  Surgeon: Crista Elliot, MD;  Location: [facility] ORS;  Service: Urology;  Laterality: N/A;  NEED PARALYSIS   Social History:  reports that he has never smoked. He has never been exposed to tobacco smoke. He has never used smokeless tobacco. He reports that he does not drink alcohol and does not use drugs.  No Known Allergies  Family History  Problem Relation Age of  Onset  . Diabetes Mother   . Hypertension Mother   . Heart attack Mother 10  . Stroke Father   . Hypertension Father   . Depression Father   . Diabetes Brother   . Hypertension Brother   . Stroke Brother 48  . Alcohol abuse Brother   . Asthma Brother   . Colon cancer Neg Hx   . Esophageal cancer Neg Hx   . Stomach cancer Neg Hx     Prior to Admission medications   Medication Sig Start Date End Date Taking? Authorizing Provider  cinacalcet (SENSIPAR) 60 MG tablet Take 60 mg  by mouth daily.    [provider]  hydrocortisone (ANUSOL-HC) 25 MG suppository Place 1 suppository (25 mg total) rectally at bedtime. 04/26/23   Esterwood, Amy S, PA-C  Methoxy PEG-Epoetin Beta (MIRCERA IJ) Mircera 03/23/23 03/21/24  [provider]  Na Sulfate-K Sulfate-Mg Sulf (SUPREP BOWEL PREP KIT) 17.5-3.13-1.6 GM/177ML SOLN Take 1 kit by mouth as directed. 04/26/23   Esterwood, Amy S, PA-C  RENAGEL 800 MG tablet Take 1,600-3,200 mg by mouth See admin instructions. Take 3200 mg with each meal and 1600 mg with each snack 02/01/18   [provider]  sucroferric oxyhydroxide (VELPHORO) 500 MG chewable tablet Chew 1,000-2,000 mg by mouth 3 (three) times daily with meals. Patient not taking: Reported on 04/26/2023    [provider]    Physical Exam: Vitals:   05/21/23 1900 05/21/23 1930 05/21/23 2041 05/21/23 2130  BP: (!) 144/90 128/73 132/79 90/70  Pulse: (!) 113 (!) 103 (!) 114   Resp: (!) 24 (!) 26 18   Temp:   99 F (37.2 C)   TempSrc:   Oral   SpO2: 98% 93% 98%   Weight:   (!) 136.6 kg   Height:   6\' 4"  (1.93 m)    *** Data Reviewed: {Tip this will not be part of the note when signed- Document your independent interpretation of telemetry tracing, EKG, lab, Radiology test or any other diagnostic tests. Add any new diagnostic test ordered today. (Optional):26781} {Results:26384}  Assessment and Plan: No notes have been filed under this hospital service. Service: Hospitalist     Advance Care Planning:   Code Status: Full Code ***  Consults: ***  Family Communication: ***  Severity of Illness: {Observation/Inpatient:21159}  AuthorLonia Blood, MD 05/22/2023 12:00 AM  For on call review [email or web address].

## 2023-05-22 NOTE — Progress Notes (Addendum)
TR[facility] night cross cover note:   I was notified by RN of the patient's updated troponin level of 396, which is up slightly from preceding value of 298 earlier this evening.  I confirmed with the patient's RN, that the patient has had no chest discomfort this evening, and is currently chest pain-free.   Per my brief chart review, cardiology has formally consulted feel that the elevated troponin is supply/demand mismatch from the preceding SVT.  They recommend continued trending of troponin, echocardiogram (which is ordered for the morning), and no indication for heparin drip at this time. I've added an additional troponin check for later this morning.    Newton Pigg, DO Hospitalist

## 2023-05-22 NOTE — Progress Notes (Signed)
PROGRESS NOTE  Devin Bullock  DOB: Aug 06, 1966  PCP: Devin Lathe, MD ZOX:[id number]  DOA: 05/21/2023  LOS: 0 days  Hospital Day: 2  Brief narrative: Devin Bullock is a 57 y.o. male with PMH significant for ESRD-HD-MWF, chronic systolic CHF, obesity, HTN, parathyroid disease, 10/5, patient presented to the [facility] from dialysis with complaint of rapid heart rate.  He was in the middle of dialysis when they noted his heart rate as high as 192.  In the ED, he was given 6 mg of adenosine with improvement heart rate to 128 Seen by cardiology. On review of symptoms, patient stated he had rectal bleeding and hematuria over the last several days.  He was worked up by GI as an outpatient and has a scheduled EGD and colonoscopy in the next week.  For hematuria, he was being seen by urologist as an outpatient.  Labs with WC count 10, hemoglobin 10, platelet normal, troponin 91, lactic acid 2.1, BNP 147 CT angio chest did not show any pulm embolism.  It showed elevated left hemidiaphragm, with left lower lung atelectasis versus scarring.    Accepted for admission to [facility] After arrival to [facility], patient's heart rate went up to 170s.  He was given 6 mg adenosine IV and metoprolol 2.5 mg IV with improvement in heart rate to 110s.  Subjective: Patient was seen and examined this morning.  Middle-aged African-American male. Propped up in bed.  Has intermittent wet/dry cough. Overnight, heart rate improved to low 100s, blood pressure 100s, breathing on room air  Assessment and plan: SVT Sinus tachycardia Adenosine sensitive SVT Seen by cardiology.  Started on metoprolol 12.5 mg twice daily Echocardiogram pending TSH normal Electrolytes normal Recent Labs  Lab 05/21/23 1321 05/22/23 0030  K 3.6 4.1  MG 2.0  --    Elevated troponin Troponin elevated to 97 uptrending to 396 this morning Likely demand ischemia in the setting of tachycardia, ESRD, systolic CHF Cardiology  following  Recent Labs    05/21/23 1321 05/21/23 1500 05/21/23 1830 05/22/23 0030 05/22/23 0951  TROPONINIHS 91* 137* 298* 396* 291*   Chronic systolic CHF  Dilated cardiomyopathy  essential hypertension PTA meds- none Blood pressure in low normal range.  Range  Cough Seems to have significant intermittent dry/wet cough. CT imaging without much edema or consolidation. Started Robitussin DM as needed  ESRD-HD-MWF Patient states that his dialysis schedule is 3 days a week, MWF.  However he was sick and could not go for dialysis on Friday and hence he was at dialysis on Saturday.  He states he completed his dialysis yesterday.  Does not look volume overloaded Nephrologist consulted  Intermittent hemorrhoidal bleeding Follows with lab for GI as outpatient.  Has plans for outpatient EGD colonoscopy in next 3 week Currently reports intermittent hemorrhoidal bleeding  Hematuria Outpatient urology evaluation in process with his urologist at [facility] Currently has intermittent bleeding from urethral meatus. Inpatient workup may or may not be related.  Will discuss with urology  Anemia of chronic disease Hemoglobin trend as below close to 10, stable, in the setting of ESRD, intermittent GI bleeding and hematuria Continue to monitor Recent Labs    03/16/23 1848 03/16/23 2012 04/26/23 1240 05/21/23 1321 05/21/23 2211  HGB 11.9* 10.7* 10.1* 10.0* 9.5*  MCV 95.4 95.5 95.2 94.6 96.1   Goiter CT chest on admission showed goiter with multiple thyroid nodules measuring up to at least 3 cm in size. Recommend thyroid ultrasound as an outpatient  Obesity  Body mass index is 37.33 kg/m. Patient has been advised to make an attempt to improve diet and exercise patterns to aid in weight loss.  Mobility: Encourage ambulation.  Independently ambulates at home.  Lives alone  Goals of care   Code Status: Full Code     DVT prophylaxis:  heparin injection 5,000 Units Start: 05/21/23  2200   Antimicrobials: None Fluid: None Consultants: Nephrology, cardiology Family Communication: None at bedside  Status: Observation Level of care:  Telemetry Cardiac   Patient is from: Home Needs to continue in-hospital care: May need further cardiac workup Anticipated d/c to: Pending clinical course      Diet:  Diet Order             Diet renal with fluid restriction Fluid restriction: 1200 mL Fluid; Room service appropriate? Yes; Fluid consistency: Thin  Diet effective now                   Scheduled Meds:  Chlorhexidine Gluconate Cloth  6 each Topical Q0600   cinacalcet  60 mg Oral Q breakfast   heparin  5,000 Units Subcutaneous Q8H   metoprolol tartrate  12.5 mg Oral BID   mupirocin ointment  1 Application Nasal BID   sevelamer carbonate  3,200 mg Oral TID WC    PRN meds: acetaminophen **OR** acetaminophen, calcium carbonate (dosed in mg elemental calcium), camphor-menthol **AND** hydrOXYzine, docusate sodium, feeding supplement (NEPRO CARB STEADY), guaiFENesin, hydrocortisone, metoprolol tartrate, ondansetron **OR** ondansetron (ZOFRAN) IV, mouth rinse, sorbitol   Infusions:    Antimicrobials: Anti-infectives (From admission, onward)    None       Objective: Vitals:   05/22/23 1047 05/22/23 1202  BP: 117/73   Pulse: (!) 103   Resp:  19  Temp:  98.4 F (36.9 C)  SpO2:      Intake/Output Summary (Last 24 hours) at 05/22/2023 1441 Last data filed at 05/21/2023 2330 Gross per 24 hour  Intake 1221.96 ml  Output --  Net 1221.96 ml   Filed Weights   05/21/23 1254 05/21/23 2041 05/22/23 0500  Weight: 135.3 kg (!) 136.6 kg (!) 139.1 kg   Weight change:  Body mass index is 37.33 kg/m.   Physical Exam: General exam: Pleasant middle-aged African-American male. Skin: No rashes, lesions or ulcers. HEENT: Atraumatic, normocephalic, no obvious bleeding Lungs: Clear to auscultation bilaterally CVS: Regular rate and rhythm, no murmur GI/Abd  soft, nontender, nondistended, bowel sound present CNS: Alert, awake, oriented x 3 Psychiatry: Mood appropriate Extremities: No pedal edema, no reference  Data Review: I have personally reviewed the laboratory data and studies available.  F/u labs ordered Unresulted Labs (From admission, onward)     Start     Ordered   Unscheduled  Basic metabolic panel  Daily,   R      05/22/23 1441   Unscheduled  CBC with Differential/Platelet  Daily,   R      05/22/23 1441            Total time spent in review of labs and imaging, patient evaluation, formulation of plan, documentation and communication with family: 55 minutes  Signed, Lorin Glass, MD [facility] 05/22/2023

## 2023-05-22 NOTE — H&P (Addendum)
History and Physical    Patient: Devin Bullock MWN:[id number] DOB: Jul 15, 1966 DOA: 05/21/2023 DOS: the patient was seen and examined on 05/22/2023 PCP: [name], MD  Patient coming from: Home  Chief Complaint:  Chief Complaint  Patient presents with   Tachycardia   HPI: Devin Bullock is a 57 y.o. male with medical history significant of end-stage renal disease on hemodialysis TTS, heart failure with reduced ejection fraction with last echo in 2014, morbid obesity, parathyroid disease, essential hypertension, who was seen at [facility] after going to dialysis where he completed but then found to have rapid heart rate.  He was in the middle of dialysis when they noted the heart rate up to 192.  No chest pain.  Mild shortness of breath.  Patient is currently being worked up for GI bleed and has scheduled EGD and colonoscopy next week.  He is also noted bleeding from his penis.  He is not making urine.  He has had that bleeding from the penis before and was seen by urologist.  Patient's heart rate was about 190 in the ER.  He was initially given adenosine 6 mg.  Heart rate came down to 110 but persisted.  He was subsequently  admitted to the hospitalist service for observation.  On arrival in the unit he had noticed: Heart rate went back to 180s.  Patient received another dose of adenosine and the heart rate came down again to 110.  Lopressor was given.  Plan was to cardiovert patient as systolic blood pressure dropped to 90.  He however responded to the adenosine.  Also added the Lopressor 5 mg IV x 1 and oral 12.5 mg twice daily.  Cardiology consulted.  Review of Systems: As mentioned in the history of present illness. All other systems reviewed and are negative. Past Medical History:  Diagnosis Date   Anemia    AV fistula (HCC)    Cancer (HCC)    prostate and bladder cancer - 2022 diagnosed   CHF (congestive heart failure) (HCC)    patient denies   Chronic kidney disease    Dialysis  patient [facility])    Enlarged heart    History of blood transfusion 03/2020   Hypertension    history of, no longer taking medication at this time   Morbid obesity (HCC)    Parathyroid disorder [facility])    Past Surgical History:  Procedure Laterality Date   AV FISTULA PLACEMENT Left 05/24/2014   Procedure: BRACHIOCEPHALIC ARTERIOVENOUS (AV) FISTULA CREATION;  Surgeon: Chuck Hint, MD;  Location: [facility] OR;  Service: Vascular;  Laterality: Left;   ESOPHAGOGASTRODUODENOSCOPY (EGD) WITH PROPOFOL N/A 04/03/2020   Procedure: ESOPHAGOGASTRODUODENOSCOPY (EGD) WITH PROPOFOL;  Surgeon: Kerin Salen, MD;  Location: [facility] ENDOSCOPY;  Service: Gastroenterology;  Laterality: N/A;   PROSTATE BIOPSY N/A 11/27/2020   Procedure: BIOPSY TRANSRECTAL ULTRASONIC PROSTATE (TUBP);  Surgeon: Crista Elliot, MD;  Location: [facility] ORS;  Service: Urology;  Laterality: N/A;  REQUESTING 24 MINS FOR CASE   REVISON OF ARTERIOVENOUS FISTULA Left 01/08/2021   Procedure: REVISON PLICATION OF ARTERIOVENOUS FISTULA LEFT;  Surgeon: Maeola Harman, MD;  Location: [facility] OR;  Service: Vascular;  Laterality: Left;   TRANSURETHRAL RESECTION OF BLADDER TUMOR N/A 11/27/2020   Procedure: TRANSURETHRAL RESECTION OF BLADDER TUMOR (TURBT);  Surgeon: Crista Elliot, MD;  Location: [facility] ORS;  Service: Urology;  Laterality: N/A;  NEED PARALYSIS   Social History:  reports that he has never smoked. He has never been exposed  to tobacco smoke. He has never used smokeless tobacco. He reports that he does not drink alcohol and does not use drugs.  No Known Allergies  Family History  Problem Relation Age of Onset   Diabetes Mother    Hypertension Mother    Heart attack Mother 33   Stroke Father    Hypertension Father    Depression Father    Diabetes Brother    Hypertension Brother    Stroke Brother 60   Alcohol abuse Brother    Asthma Brother    Colon cancer Neg Hx    Esophageal cancer Neg Hx    Stomach cancer Neg Hx     Prior to  Admission medications   Medication Sig Start Date End Date Taking? Authorizing Provider  cinacalcet (SENSIPAR) 60 MG tablet Take 60 mg by mouth daily.    [provider]  hydrocortisone (ANUSOL-HC) 25 MG suppository Place 1 suppository (25 mg total) rectally at bedtime. 04/26/23   Esterwood, Amy S, PA-C  Methoxy PEG-Epoetin Beta (MIRCERA IJ) Mircera 03/23/23 03/21/24  [provider]  Na Sulfate-K Sulfate-Mg Sulf (SUPREP BOWEL PREP KIT) 17.5-3.13-1.6 GM/177ML SOLN Take 1 kit by mouth as directed. 04/26/23   Esterwood, Amy S, PA-C  RENAGEL 800 MG tablet Take 1,600-3,200 mg by mouth See admin instructions. Take 3200 mg with each meal and 1600 mg with each snack 02/01/18   [provider]  sucroferric oxyhydroxide (VELPHORO) 500 MG chewable tablet Chew 1,000-2,000 mg by mouth 3 (three) times daily with meals. Patient not taking: Reported on 04/26/2023    [provider]    Physical Exam: Vitals:   05/21/23 1900 05/21/23 1930 05/21/23 2041 05/21/23 2130  BP: (!) 144/90 128/73 132/79 90/70  Pulse: (!) 113 (!) 103 (!) 114   Resp: (!) 24 (!) 26 18   Temp:   99 F (37.2 C)   TempSrc:   Oral   SpO2: 98% 93% 98%   Weight:   (!) 136.6 kg   Height:   6\' 4"  (1.93 m)    Constitutional: NAD, calm, comfortable Eyes: PERRL, lids and conjunctivae normal ENMT: Mucous membranes are moist. Posterior pharynx clear of any exudate or lesions.Normal dentition.  Neck: normal, supple, no masses, no thyromegaly Respiratory: clear to auscultation bilaterally, no wheezing, no crackles. Normal respiratory effort. No accessory muscle use.  Cardiovascular: Sinus tachycardia, no murmurs / rubs / gallops. No extremity edema. 2+ pedal pulses. No carotid bruits.  Abdomen: no tenderness, no masses palpated. No hepatosplenomegaly. Bowel sounds positive.  Musculoskeletal: Good range of motion, no joint swelling or tenderness, Skin: no rashes, lesions, ulcers. No induration Neurologic: CN 2-12  grossly intact. Sensation intact, DTR normal. Strength 5/5 in all 4.  Psychiatric: Normal judgment and insight. Alert and oriented x 3. Normal mood  Data Reviewed:  Heart rate 180s down to 113, blood pressure 144/90, troponin 137 and then 298, hemoglobin 9.5.  Platelets 167. Troponin 6.58 hemoglobin 10.0, creatinine 6.58.  TSH 0.9 and T41.04.  Chest x-ray showed enlarged heart with no edema CT angio chest showed no evidence of PE mild pericardial effusion with thickened diffuse goiter with multiple thyroid nodules measuring up to 3 cm in size. .   Assessment and Plan:  #1 SVTs: Patient's heart rate up to 180.  Appears to have responded initially to adenosine.  Now on Lopressor.  Thyroid function appears normal.  Will get echocardiogram in the morning.  Cardiology consulted.  Will follow recommendations.  #2 end-stage renal disease: Hemodialysis on  Tuesdays Thursdays and Saturdays.  Nephrology will be consulted.  #3 essential hypertension: Will resume home regimen ultimately.  #4 anemia of chronic disease: Patient also reported some GI bleed. Hemoglobin 10.0.  Will have patient follow-up with GI for scheduled outpatient EGD and colonoscopy after discharge.  #5 hematuria: Patient appears to be bleeding from urethral meatus.  He has been seen by urology before.  If bleeding persist we will consult urology.    Advance Care Planning:   Code Status: Full Code full code  Consults: Cardiology consult Dr. Libby Maw  Family Communication: No family at bedside  Severity of Illness: The appropriate patient status for this patient is INPATIENT. Inpatient status is judged to be reasonable and necessary in order to provide the required intensity of service to ensure the patient's safety. The patient's presenting symptoms, physical exam findings, and initial radiographic and laboratory data in the context of their chronic comorbidities is felt to place them at high risk for further clinical deterioration.  Furthermore, it is not anticipated that the patient will be medically stable for discharge from the hospital within 2 midnights of admission.   * I certify that at the point of admission it is my clinical judgment that the patient will require inpatient hospital care spanning beyond 2 midnights from the point of admission due to high intensity of service, high risk for further deterioration and high frequency of surveillance required.*  AuthorLonia Blood, MD 05/22/2023 12:00 AM  For on call review [email or web address].

## 2023-05-22 NOTE — Consult Note (Signed)
Renal Service Consult Note [facility]  Devin Bullock 05/22/2023 Devin Krabbe, MD Requesting Physician: Dr. Pola Bullock  Reason for Consult: ESRD pt with SVT HPI: The patient is a 57 y.o. year-old w/ PMH as below who presented to ED sent from dialysis for rapid heart rates.  Pt was in middle of his HD when they noted a heart rate of 192. Pt was not symptomatic. In ED at [facility] he rec'd adenosine 6 mg and HR came down to 110. Then the HR went back up to 180s. Pt rec'd more adenosine and lopressor and HR went down to 110s. SBP dropped to 90s and the plan was to cardiovert but then he responded. Cardiology was consulted and noted chronic systolic CHF w/ LVEF 45-50% (from 10 yrs ago), LV severely dilated, LAFB. Recommended continue po BB metoprolol 12.5 mg bid, titrate as needed. K > 4, Mg > 2, get new echo and troponins. Pt was admitted. We are asked to see pt for dialysis.   Pt seen in room. States he has not had any SOB or CP with these episodes. Good HD compliance, get's down to dry wt regularly.   ROS - denies CP, no joint pain, no HA, no blurry vision, no rash, no diarrhea, no nausea/ vomiting, no dysuria, no difficulty voiding   Past Medical History  Past Medical History:  Diagnosis Date   Anemia    AV fistula (HCC)    Cancer (HCC)    prostate and bladder cancer - 2022 diagnosed   CHF (congestive heart failure) (HCC)    patient denies   Chronic kidney disease    Dialysis patient [facility])    History of blood transfusion 03/2020   Hypertension    history of, no longer taking medication at this time   Morbid obesity (HCC)    Parathyroid disorder [facility])    Past Surgical History  Past Surgical History:  Procedure Laterality Date   AV FISTULA PLACEMENT Left 05/24/2014   Procedure: BRACHIOCEPHALIC ARTERIOVENOUS (AV) FISTULA CREATION;  Surgeon: Devin Hint, MD;  Location: [facility] OR;  Service: Vascular;  Laterality: Left;   ESOPHAGOGASTRODUODENOSCOPY (EGD) WITH  PROPOFOL N/A 04/03/2020   Procedure: ESOPHAGOGASTRODUODENOSCOPY (EGD) WITH PROPOFOL;  Surgeon: Devin Salen, MD;  Location: [facility] ENDOSCOPY;  Service: Gastroenterology;  Laterality: N/A;   PROSTATE BIOPSY N/A 11/27/2020   Procedure: BIOPSY TRANSRECTAL ULTRASONIC PROSTATE (TUBP);  Surgeon: Devin Elliot, MD;  Location: [facility];  Service: Urology;  Laterality: N/A;  REQUESTING 52 MINS FOR CASE   REVISON OF ARTERIOVENOUS FISTULA Left 01/08/2021   Procedure: REVISON PLICATION OF ARTERIOVENOUS FISTULA LEFT;  Surgeon: Devin Harman, MD;  Location: [facility] OR;  Service: Vascular;  Laterality: Left;   TRANSURETHRAL RESECTION OF BLADDER TUMOR N/A 11/27/2020   Procedure: TRANSURETHRAL RESECTION OF BLADDER TUMOR (TURBT);  Surgeon: Devin Elliot, MD;  Location: [facility] ORS;  Service: Urology;  Laterality: N/A;  NEED PARALYSIS   Family History  Family History  Problem Relation Age of Onset   Diabetes Mother    Hypertension Mother    Heart attack Mother 46   Stroke Father    Hypertension Father    Depression Father    Diabetes Brother    Hypertension Brother    Stroke Brother 8   Alcohol abuse Brother    Asthma Brother    Colon cancer Neg Hx    Esophageal cancer Neg Hx    Stomach cancer Neg Hx    Social History  reports that  he has never smoked. He has never been exposed to tobacco smoke. He has never used smokeless tobacco. He reports that he does not drink alcohol and does not use drugs. Allergies No Known Allergies Home medications Prior to Admission medications   Medication Sig Start Date End Date Taking? Authorizing Provider  cinacalcet (SENSIPAR) 60 MG tablet Take 60 mg by mouth daily.   Yes [provider]  guaifenesin (ROBITUSSIN) 100 MG/5ML syrup Take 200 mg by mouth 3 (three) times daily as needed for cough.   Yes [provider]  hydrocortisone (ANUSOL-HC) 25 MG suppository Place 1 suppository (25 mg total) rectally at bedtime. 04/26/23  Yes Bullock, Devin S, PA-C   sevelamer carbonate (RENVELA) 800 MG tablet Take 2,400-3,200 mg by mouth 2 (two) times daily. 05/17/23  Yes [provider]  Na Sulfate-K Sulfate-Mg Sulf (SUPREP BOWEL PREP KIT) 17.5-3.13-1.6 GM/177ML SOLN Take 1 kit by mouth as directed. Patient not taking: Reported on 05/22/2023 04/26/23   Devin Cooper, PA-C     Vitals:   05/22/23 1047 05/22/23 1200 05/22/23 1202 05/22/23 1542  BP: 117/73 124/73  125/80  Pulse: (!) 103 91  99  Resp:   19 19  Temp:   98.4 F (36.9 C) 98.4 F (36.9 C)  TempSrc:   Oral Oral  SpO2:  98%  98%  Weight:      Height:       Exam Gen alert, no distress. Large framed AAM.  No rash, cyanosis or gangrene Sclera anicteric, throat clear  No jvd or bruits Chest clear bilat to bases, no rales/ wheezing RRR no RG Abd soft ntnd no mass or ascites +bs GU normal male MS no joint effusions or deformity Ext no LE or UE edema, no wounds or ulcers Neuro is alert, Ox 3 , nf    LFA AVF +bruit       Renal-related home meds: - sevelamer 3-4 ac tid - sensipar 60mg  qam - others: anusol supp, prns    OP HD: [facility] M[facility]  4.5h   450/1.5   135kg   2/2 bath  AVF  Heparin 2800 - last OP HD Sat 10/05 off schedule (missed Friday), post wt 135.2kg  - rocaltrol 2 mcg po - venofer 50mg  weekly IV - mircera 100 mcg q 2 wks, due 10/02 (?given) - good compliance   Assessment/ Plan: SVT - w/ HR 180s, adenosine sensitive. Started on metoprolol 12.5 bid. Echo pending. TSH wnl. Per pmd/ cardiology.  ESRD - on HD MWF. Last HD was Sat (he missed Friday). Next HD monday HTN - bp's running normal 120/ 80 range. Not on any BP lowering meds Volume - up 4kg by wts. Will get standing wt prior to HD. UF to dry wt.  Anemia esrd - Hb 10 here, no esa needed. If Hb drops will consider giving esa as it appears he missed his last mircera due on 10/02.  MBD ckd - CCa in range, add on phos. Cont renvela, sensipar and po vdra.  Chronic systolic CHF - last EF 35-40%, repeat pending.        Devin Moselle  MD [facility] 05/22/2023, 6:39 PM  Recent Labs  Lab 05/21/23 1321 05/21/23 2211 05/22/23 0030  HGB 10.0* 9.5*  --   ALBUMIN  --   --  3.1*  CALCIUM 8.9  --  9.2  CREATININE 6.58* 8.23* 8.39*  K 3.6  --  4.1   Inpatient medications:  Chlorhexidine Gluconate Cloth  6 each Topical Q0600  cinacalcet  60 mg Oral Q breakfast   heparin  5,000 Units Subcutaneous Q8H   metoprolol tartrate  12.5 mg Oral BID   mupirocin ointment  1 Application Nasal BID   sevelamer carbonate  3,200 mg Oral TID WC    acetaminophen **OR** acetaminophen, calcium carbonate (dosed in mg elemental calcium), camphor-menthol **AND** hydrOXYzine, docusate sodium, feeding supplement (NEPRO CARB STEADY), guaiFENesin, hydrocortisone, metoprolol tartrate, ondansetron **OR** ondansetron (ZOFRAN) IV, mouth rinse, sorbitol

## 2023-05-22 NOTE — Plan of Care (Signed)

## 2023-05-22 NOTE — Progress Notes (Signed)
   05/21/23 2041  Assess: MEWS Score  Temp 99 F (37.2 C)  BP 132/79  MAP (mmHg) 97  Pulse Rate (!) 114  ECG Heart Rate (!) 112  Resp 18  Level of Consciousness Alert  SpO2 98 %  O2 Device Room Air  Assess: MEWS Score  MEWS Temp 0  MEWS Systolic 0  MEWS Pulse 2  MEWS RR 0  MEWS LOC 0  MEWS Score 2  MEWS Score Color Yellow  Assess: if the MEWS score is Yellow or Red  Were vital signs accurate and taken at a resting state? Yes  Does the patient meet 2 or more of the SIRS criteria? No  MEWS guidelines implemented  Yes, yellow  Treat  MEWS Interventions Considered administering scheduled or prn medications/treatments as ordered  Take Vital Signs  Increase Vital Sign Frequency  Yellow: Q2hr x1, continue Q4hrs until patient remains green for 12hrs  Escalate  MEWS: Escalate Yellow: Discuss with charge nurse and consider notifying provider and/or RRT  Notify: Charge Nurse/RN  Name of Charge Nurse/RN Notified Ann, RN  Assess: SIRS CRITERIA  SIRS Temperature  0  SIRS Pulse 1  SIRS Respirations  0  SIRS WBC 0  SIRS Score Sum  1   Patient admitted to floor from [facility] with yellow MEWS d/t HR. Charge nurse alerted. Considered rapid and escalation; however, patient here for SVT, so HR is inclusive to admission reason.

## 2023-05-22 NOTE — Progress Notes (Signed)
Progress Note  Patient Name: Devin Bullock Date of Encounter: 05/22/2023  Primary Cardiologist:   None   Subjective   Denies pain or SOB.  Has active hematuria and reports GI bleed as well  Inpatient Medications    Scheduled Meds:  Chlorhexidine Gluconate Cloth  6 each Topical Q0600   cinacalcet  60 mg Oral Q breakfast   heparin  5,000 Units Subcutaneous Q8H   metoprolol tartrate  12.5 mg Oral BID   mupirocin ointment  1 Application Nasal BID   sevelamer carbonate  3,200 mg Oral TID WC   Continuous Infusions:  PRN Meds: acetaminophen **OR** acetaminophen, calcium carbonate (dosed in mg elemental calcium), camphor-menthol **AND** hydrOXYzine, docusate sodium, feeding supplement (NEPRO CARB STEADY), hydrocortisone, metoprolol tartrate, ondansetron **OR** ondansetron (ZOFRAN) IV, mouth rinse, sorbitol   Vital Signs    Vitals:   05/22/23 0009 05/22/23 0330 05/22/23 0500 05/22/23 0737  BP: 120/71 100/64  (!) 89/70  Pulse: (!) 105 (!) 106    Resp:  20  (!) 21  Temp:  98.8 F (37.1 C)  98.3 F (36.8 C)  TempSrc:  Oral  Oral  SpO2:  96%    Weight:   (!) 139.1 kg   Height:        Intake/Output Summary (Last 24 hours) at 05/22/2023 1002 Last data filed at 05/21/2023 2330 Gross per 24 hour  Intake 1221.96 ml  Output --  Net 1221.96 ml   Filed Weights   05/21/23 1254 05/21/23 2041 05/22/23 0500  Weight: 135.3 kg (!) 136.6 kg (!) 139.1 kg    Telemetry    NSR, PVCs, brief runs of SVT - Personally Reviewed  ECG    NA - Personally Reviewed  Physical Exam   GEN: No acute distress.   Neck: No  JVD Cardiac: RRR, no murmurs, rubs, or gallops.  Respiratory:     Decreased breath sounds with diffuse wheezes and rhonchi.   GI: Soft, nontender, non-distended  MS: No  edema; No deformity. Neuro:  Nonfocal  Psych: Normal affect   Labs    Chemistry Recent Labs  Lab 05/21/23 1321 05/21/23 2211 05/22/23 0030  NA 136  --  134*  K 3.6  --  4.1  CL 93*  --  90*   CO2 29  --  31  GLUCOSE 159*  --  138*  BUN 29*  --  36*  CREATININE 6.58* 8.23* 8.39*  CALCIUM 8.9  --  9.2  PROT  --   --  6.9  ALBUMIN  --   --  3.1*  AST  --   --  21  ALT  --   --  16  ALKPHOS  --   --  78  BILITOT  --   --  0.7  GFRNONAA 9* 7* 7*  ANIONGAP 14  --  13     Hematology Recent Labs  Lab 05/21/23 1321 05/21/23 2211  WBC 10.0 8.3  RBC 3.17* 3.06*  HGB 10.0* 9.5*  HCT 30.0* 29.4*  MCV 94.6 96.1  MCH 31.5 31.0  MCHC 33.3 32.3  RDW 15.6* 15.8*  PLT 161 167    Cardiac EnzymesNo results for input(s): "TROPONINI" in the last 168 hours. No results for input(s): "TROPIPOC" in the last 168 hours.   BNP Recent Labs  Lab 05/21/23 1321  BNP 147.1*     DDimer No results for input(s): "DDIMER" in the last 168 hours.   Radiology    CT Angio Chest PE  W and/or Wo Contrast  Result Date: 05/21/2023 CLINICAL DATA:  Tachycardia and palpitations beginning during dialysis today. Dyspnea. High probability for pulmonary embolism. EXAM: CT ANGIOGRAPHY CHEST WITH CONTRAST TECHNIQUE: Multidetector CT imaging of the chest was performed using the standard protocol during bolus administration of intravenous contrast. Multiplanar CT image reconstructions and MIPs were obtained to evaluate the vascular anatomy. RADIATION DOSE REDUCTION: This exam was performed according to the departmental dose-optimization program which includes automated exposure control, adjustment of the mA and/or kV according to patient size and/or use of iterative reconstruction technique. CONTRAST:  OMNIPAQUE IOHEXOL 350 MG/ML SOLN COMPARISON:  None Available. FINDINGS: Cardiovascular: Satisfactory opacification of pulmonary arteries noted, and no pulmonary emboli identified. No evidence of thoracic aortic dissection or aneurysm. Mild pericardial effusion versus thickening. Mediastinum/Nodes: No pathologically enlarged lymph nodes identified. Diffuse goiter is seen with multiple thyroid nodules measuring  up to at least 3 cm in size. Lungs/Pleura: Elevated left hemidiaphragm. Left lower lung atelectasis versus scarring. No pulmonary mass, infiltrate, or effusion. Upper abdomen: No acute findings. Musculoskeletal: No suspicious bone lesions identified. Review of the MIP images confirms the above findings. IMPRESSION: No evidence of pulmonary embolism. Elevated left hemidiaphragm, with left lower lung atelectasis versus scarring. Mild pericardial effusion versus thickening. Diffuse goiter with multiple thyroid nodules measuring up to at least 3 cm in size. Recommend thyroid US. (ref: J Am Coll Radiol. 2015 Feb;12(2): 143-50). Electronically Signed   By: Danae Orleans M.D.   On: 05/21/2023 15:11   DG Chest Portable 1 View  Result Date: 05/21/2023 CLINICAL DATA:  SVT. EXAM: PORTABLE CHEST 1 VIEW COMPARISON:  03/16/2023 x-ray FINDINGS: Overlapping cardiac leads and defibrillator pads. Enlarged heart. Mild basilar atelectasis on the left. No consolidation, pneumothorax or effusion. No edema. Elevation left hemidiaphragm. Note is made of calcifications in the low neck soft tissues. Unchanged from previous. IMPRESSION: Enlarged heart. No edema. Left basilar atelectasis. Slight elevation of the left hemidiaphragm. Electronically Signed   By: Karen Kays M.D.   On: 05/21/2023 14:24    Cardiac Studies   Echo:  Pending results  Patient Profile     57 y.o. male  with a hx of non-muscle invasive bladder cancer, high risk adenocarcinoma of prostate, ESRD on IHD, chronic heart failure with reduced ejection fraction (EF 45-50%), and dilated cardiomyopathy who is being seen 05/21/2023 for the evaluation of tachycardia at the request of hospital medicine.   Assessment & Plan    SVT:  On beta blocker.   Very brief runs overnight.  Trop up slightly.  Echo pending.  BP is labile but he seems to be tolerating at least a low dose of metoprolol.  Continue this.  We will titrate if BP allows.   Elevated trop:  Likely  secondary to his SVT and demand ischemia.    Check echo.  If significant wall motion abnormality he would need cath.  If echo OK I would consider non invasive evaluation with coronary CT.    Cardiomyopathy:  I do see a distant echo with EF of 45 - 50%.   He has not followed with cardiology for 10 years.  Sounds like his volume is managed through dialysis.    BP will not allow med titration.    Hematuria:  He is being seen by urology.  GI bleed:  He has seen GI and there are plans for EGD and colonoscopy.    For questions or updates, please contact [facility] Please consult [email or web address] for contact info under Cardiology/STEMI.  Signed, Rollene Rotunda, MD  05/22/2023, 10:02 AM

## 2023-05-23 DIAGNOSIS — I2489 Other forms of acute ischemic heart disease: Secondary | ICD-10-CM

## 2023-05-23 DIAGNOSIS — I471 Supraventricular tachycardia, unspecified: Secondary | ICD-10-CM | POA: Diagnosis not present

## 2023-05-23 LAB — CBC WITH DIFFERENTIAL/PLATELET
Abs Immature Granulocytes: 0.04 10*3/uL (ref 0.00–0.07)
Basophils Absolute: 0 10*3/uL (ref 0.0–0.1)
Basophils Relative: 0 %
Eosinophils Absolute: 0.2 10*3/uL (ref 0.0–0.5)
Eosinophils Relative: 2 %
HCT: 24.3 % — ABNORMAL LOW (ref 39.0–52.0)
Hemoglobin: 7.7 g/dL — ABNORMAL LOW (ref 13.0–17.0)
Immature Granulocytes: 1 %
Lymphocytes Relative: 16 %
Lymphs Abs: 1.1 10*3/uL (ref 0.7–4.0)
MCH: 30.7 pg (ref 26.0–34.0)
MCHC: 31.7 g/dL (ref 30.0–36.0)
MCV: 96.8 fL (ref 80.0–100.0)
Monocytes Absolute: 1 10*3/uL (ref 0.1–1.0)
Monocytes Relative: 14 %
Neutro Abs: 4.8 10*3/uL (ref 1.7–7.7)
Neutrophils Relative %: 67 %
Platelets: 177 10*3/uL (ref 150–400)
RBC: 2.51 MIL/uL — ABNORMAL LOW (ref 4.22–5.81)
RDW: 15.8 % — ABNORMAL HIGH (ref 11.5–15.5)
WBC: 7.2 10*3/uL (ref 4.0–10.5)
nRBC: 0 % (ref 0.0–0.2)

## 2023-05-23 LAB — BASIC METABOLIC PANEL
Anion gap: 13 (ref 5–15)
BUN: 57 mg/dL — ABNORMAL HIGH (ref 6–20)
CO2: 29 mmol/L (ref 22–32)
Calcium: 8.6 mg/dL — ABNORMAL LOW (ref 8.9–10.3)
Chloride: 89 mmol/L — ABNORMAL LOW (ref 98–111)
Creatinine, Ser: 11.01 mg/dL — ABNORMAL HIGH (ref 0.61–1.24)
GFR, Estimated: 5 mL/min — ABNORMAL LOW (ref >60)
Glucose, Bld: 92 mg/dL (ref 70–99)
Potassium: 4.1 mmol/L (ref 3.5–5.1)
Sodium: 131 mmol/L — ABNORMAL LOW (ref 135–145)

## 2023-05-23 LAB — LIPID PANEL
Cholesterol: 143 mg/dL (ref 0–200)
HDL: 50 mg/dL (ref >40)
LDL Cholesterol: 75 mg/dL (ref 0–99)
Total CHOL/HDL Ratio: 2.9 {ratio}
Triglycerides: 90 mg/dL (ref <150)
VLDL: 18 mg/dL (ref 0–40)

## 2023-05-23 MED ORDER — HEPARIN SODIUM (PORCINE) 1000 UNIT/ML DIALYSIS
2000.0000 [IU] | Freq: Once | INTRAMUSCULAR | Status: AC
  Administered 2023-05-23: 2000 [IU] via INTRAVENOUS_CENTRAL
  Filled 2023-05-23: qty 2

## 2023-05-23 MED ORDER — ATORVASTATIN CALCIUM 40 MG PO TABS
40.0000 mg | ORAL_TABLET | Freq: Every day | ORAL | Status: DC
  Administered 2023-05-23 – 2023-05-24 (×2): 40 mg via ORAL
  Filled 2023-05-23 (×3): qty 1

## 2023-05-23 MED ORDER — HEPARIN SODIUM (PORCINE) 1000 UNIT/ML DIALYSIS
1000.0000 [IU] | INTRAMUSCULAR | Status: DC | PRN
  Filled 2023-05-23: qty 1

## 2023-05-23 MED ORDER — ALBUMIN HUMAN 25 % IV SOLN
25.0000 g | Freq: Once | INTRAVENOUS | Status: AC
  Administered 2023-05-23: 25 g via INTRAVENOUS
  Filled 2023-05-23: qty 100

## 2023-05-23 MED ORDER — PENTAFLUOROPROP-TETRAFLUOROETH EX AERO: 1.0000 | INHALATION_SPRAY | CUTANEOUS | Status: DC | PRN

## 2023-05-23 MED ORDER — DARBEPOETIN ALFA 100 MCG/0.5ML IJ SOSY
100.0000 ug | PREFILLED_SYRINGE | INTRAMUSCULAR | Status: DC
  Administered 2023-05-23: 100 ug via SUBCUTANEOUS
  Filled 2023-05-23: qty 0.5

## 2023-05-23 NOTE — Procedures (Signed)
HD Note:  Some information was entered later than the data was gathered due to patient care needs. The stated time with the data is accurate.  Received patient in bed to unit.   Alert and oriented.   Informed consent signed and in chart.   Access used: Lower left arm Access issues: None  Patient tolerated treatment well.  Had SBP readings that were not WDL.  Patient was asymptomatic and stated it was because we were not taking it manually.   Manual check did result in a higher reading.  BP cuff placed on lower right arm and readings were WDL.  Patient stated that he did not feel any effect from the low BP readings.  TX duration: 3 hours  Alert, without acute distress.  Total UF removed: 2500 ml  Hand-off given to patient's nurse.   Transported back to the room   Jamisha Hoeschen L. Dareen Piano, RN [facility] D[facility] Unit.

## 2023-05-23 NOTE — Progress Notes (Signed)
Pt receives out-pt HD at [facility] on MWF. Will assist as needed.   Tracy Mounce Renal Navigator [phone] 

## 2023-05-23 NOTE — Progress Notes (Signed)
Cardiology Progress Note  Patient ID: Devin Bullock MRN: [id number] DOB: 09-02-1965 Date of Encounter: 05/23/2023  Primary Cardiologist: None  Subjective   Chief Complaint: None.   HPI: No recurrence of arrhythmia.  Denies chest pain.  ROS:  All other ROS reviewed and negative. Pertinent positives noted in the HPI.     Inpatient Medications  Scheduled Meds:  calcitRIOL  2 mcg Oral Q M,W,F   Chlorhexidine Gluconate Cloth  6 each Topical Q0600   cinacalcet  60 mg Oral Q breakfast   heparin  5,000 Units Subcutaneous Q8H   metoprolol tartrate  12.5 mg Oral BID   mupirocin ointment  1 Application Nasal BID   sevelamer carbonate  3,200 mg Oral TID WC   Continuous Infusions:  PRN Meds: acetaminophen **OR** acetaminophen, calcium carbonate (dosed in mg elemental calcium), camphor-menthol **AND** hydrOXYzine, docusate sodium, feeding supplement (NEPRO CARB STEADY), guaiFENesin, [START ON 05/24/2023] heparin, hydrocortisone, metoprolol tartrate, ondansetron **OR** ondansetron (ZOFRAN) IV, mouth rinse, pentafluoroprop-tetrafluoroeth, sorbitol   Vital Signs   Vitals:   05/23/23 1052 05/23/23 1114 05/23/23 1130 05/23/23 1136  BP: (!) 85/54 103/61 (!) 84/64 95/63  Pulse: 99 97 (!) 101 (!) 107  Resp: 19 18 (!) 25 (!) 27  Temp:    97.6 F (36.4 C)  TempSrc:      SpO2: 99% 96% 96% 96%  Weight:      Height:        Intake/Output Summary (Last 24 hours) at 05/23/2023 1145 Last data filed at 05/23/2023 1136 Gross per 24 hour  Intake 240 ml  Output 2.5 ml  Net 237.5 ml      05/23/2023    8:15 AM 05/23/2023    5:20 AM 05/22/2023    5:00 AM  Last 3 Weights  Weight (lbs) 304 lb 7.3 oz 301 lb 5.9 oz 306 lb 10.6 oz  Weight (kg) 138.1 kg 136.7 kg 139.1 kg      Telemetry  Overnight telemetry shows sinus rhythm 90s, which I personally reviewed.   ECG  The most recent ECG shows ST 112 bpm, iRBBB, which I personally reviewed.   Physical Exam   Vitals:   05/23/23 1052 05/23/23 1114  05/23/23 1130 05/23/23 1136  BP: (!) 85/54 103/61 (!) 84/64 95/63  Pulse: 99 97 (!) 101 (!) 107  Resp: 19 18 (!) 25 (!) 27  Temp:    97.6 F (36.4 C)  TempSrc:      SpO2: 99% 96% 96% 96%  Weight:      Height:        Intake/Output Summary (Last 24 hours) at 05/23/2023 1145 Last data filed at 05/23/2023 1136 Gross per 24 hour  Intake 240 ml  Output 2.5 ml  Net 237.5 ml       05/23/2023    8:15 AM 05/23/2023    5:20 AM 05/22/2023    5:00 AM  Last 3 Weights  Weight (lbs) 304 lb 7.3 oz 301 lb 5.9 oz 306 lb 10.6 oz  Weight (kg) 138.1 kg 136.7 kg 139.1 kg    Body mass index is 37.06 kg/m.  General: Well nourished, well developed, in no acute distress Head: Atraumatic, normal size  Eyes: PEERLA, EOMI  Neck: Supple, no JVD Endocrine: No thryomegaly Cardiac: Normal S1, S2; RRR; no murmurs, rubs, or gallops Lungs: Clear to auscultation bilaterally, no wheezing, rhonchi or rales  Abd: Soft, nontender, no hepatomegaly  Ext: No edema, pulses 2+ Musculoskeletal: No deformities, BUE and BLE strength normal and equal Skin:  Cardiology Progress Note  Patient ID: Devin Bullock MRN: [id number] DOB: 09-02-1965 Date of Encounter: 05/23/2023  Primary Cardiologist: None  Subjective   Chief Complaint: None.   HPI: No recurrence of arrhythmia.  Denies chest pain.  ROS:  All other ROS reviewed and negative. Pertinent positives noted in the HPI.     Inpatient Medications  Scheduled Meds:  calcitRIOL  2 mcg Oral Q M,W,F   Chlorhexidine Gluconate Cloth  6 each Topical Q0600   cinacalcet  60 mg Oral Q breakfast   heparin  5,000 Units Subcutaneous Q8H   metoprolol tartrate  12.5 mg Oral BID   mupirocin ointment  1 Application Nasal BID   sevelamer carbonate  3,200 mg Oral TID WC   Continuous Infusions:  PRN Meds: acetaminophen **OR** acetaminophen, calcium carbonate (dosed in mg elemental calcium), camphor-menthol **AND** hydrOXYzine, docusate sodium, feeding supplement (NEPRO CARB STEADY), guaiFENesin, [START ON 05/24/2023] heparin, hydrocortisone, metoprolol tartrate, ondansetron **OR** ondansetron (ZOFRAN) IV, mouth rinse, pentafluoroprop-tetrafluoroeth, sorbitol   Vital Signs   Vitals:   05/23/23 1052 05/23/23 1114 05/23/23 1130 05/23/23 1136  BP: (!) 85/54 103/61 (!) 84/64 95/63  Pulse: 99 97 (!) 101 (!) 107  Resp: 19 18 (!) 25 (!) 27  Temp:    97.6 F (36.4 C)  TempSrc:      SpO2: 99% 96% 96% 96%  Weight:      Height:        Intake/Output Summary (Last 24 hours) at 05/23/2023 1145 Last data filed at 05/23/2023 1136 Gross per 24 hour  Intake 240 ml  Output 2.5 ml  Net 237.5 ml      05/23/2023    8:15 AM 05/23/2023    5:20 AM 05/22/2023    5:00 AM  Last 3 Weights  Weight (lbs) 304 lb 7.3 oz 301 lb 5.9 oz 306 lb 10.6 oz  Weight (kg) 138.1 kg 136.7 kg 139.1 kg      Telemetry  Overnight telemetry shows sinus rhythm 90s, which I personally reviewed.   ECG  The most recent ECG shows ST 112 bpm, iRBBB, which I personally reviewed.   Physical Exam   Vitals:   05/23/23 1052 05/23/23 1114  05/23/23 1130 05/23/23 1136  BP: (!) 85/54 103/61 (!) 84/64 95/63  Pulse: 99 97 (!) 101 (!) 107  Resp: 19 18 (!) 25 (!) 27  Temp:    97.6 F (36.4 C)  TempSrc:      SpO2: 99% 96% 96% 96%  Weight:      Height:        Intake/Output Summary (Last 24 hours) at 05/23/2023 1145 Last data filed at 05/23/2023 1136 Gross per 24 hour  Intake 240 ml  Output 2.5 ml  Net 237.5 ml       05/23/2023    8:15 AM 05/23/2023    5:20 AM 05/22/2023    5:00 AM  Last 3 Weights  Weight (lbs) 304 lb 7.3 oz 301 lb 5.9 oz 306 lb 10.6 oz  Weight (kg) 138.1 kg 136.7 kg 139.1 kg    Body mass index is 37.06 kg/m.  General: Well nourished, well developed, in no acute distress Head: Atraumatic, normal size  Eyes: PEERLA, EOMI  Neck: Supple, no JVD Endocrine: No thryomegaly Cardiac: Normal S1, S2; RRR; no murmurs, rubs, or gallops Lungs: Clear to auscultation bilaterally, no wheezing, rhonchi or rales  Abd: Soft, nontender, no hepatomegaly  Ext: No edema, pulses 2+ Musculoskeletal: No deformities, BUE and BLE strength normal and equal Skin:  Warm and dry, no rashes   Neuro: Alert and oriented to person, place, time, and situation, CNII-XII grossly intact, no focal deficits  Psych: Normal mood and affect   Labs  High Sensitivity Troponin:   Recent Labs  Lab 05/21/23 1321 05/21/23 1500 05/21/23 1830 05/22/23 0030 05/22/23 0951  TROPONINIHS 91* 137* 298* 396* 291*     Cardiac EnzymesNo results for input(s): "TROPONINI" in the last 168 hours. No results for input(s): "TROPIPOC" in the last 168 hours.  Chemistry Recent Labs  Lab 05/21/23 1321 05/21/23 2211 05/22/23 0030 05/23/23 0232  NA 136  --  134* 131*  K 3.6  --  4.1 4.1  CL 93*  --  90* 89*  CO2 29  --  31 29  GLUCOSE 159*  --  138* 92  BUN 29*  --  36* 57*  CREATININE 6.58* 8.23* 8.39* 11.01*  CALCIUM 8.9  --  9.2 8.6*  PROT  --   --  6.9  --   ALBUMIN  --   --  3.1*  --   AST  --   --  21  --   ALT  --   --  16  --   ALKPHOS  --    --  78  --   BILITOT  --   --  0.7  --   GFRNONAA 9* 7* 7* 5*  ANIONGAP 14  --  13 13    Hematology Recent Labs  Lab 05/21/23 1321 05/21/23 2211 05/23/23 0232  WBC 10.0 8.3 7.2  RBC 3.17* 3.06* 2.51*  HGB 10.0* 9.5* 7.7*  HCT 30.0* 29.4* 24.3*  MCV 94.6 96.1 96.8  MCH 31.5 31.0 30.7  MCHC 33.3 32.3 31.7  RDW 15.6* 15.8* 15.8*  PLT 161 167 177   BNP Recent Labs  Lab 05/21/23 1321  BNP 147.1*    DDimer No results for input(s): "DDIMER" in the last 168 hours.   Radiology  ECHOCARDIOGRAM COMPLETE  Result Date: 05/22/2023    ECHOCARDIOGRAM REPORT   Patient Name:   Devin Bullock Date of Exam: 05/22/2023 Medical Rec #:  [id number]   Height:       76.0 in Accession #:    [id number]  Weight:       306.7 lb Date of Birth:  07-30-1966   BSA:          2.656 m Patient Age:    57 years    BP:           110/89 mmHg Patient Gender: M           HR:           97 bpm. Exam Location:  Inpatient Procedure: 2D Echo, Cardiac Doppler, Color Doppler and Intracardiac            Opacification Agent                                 MODIFIED REPORT:  This report was modified by Thomasene Ripple DO on 05/22/2023 due to ascending aorta                                    dimension.  Indications:     CHF- Acute Systolic  History:         Patient has prior history of Echocardiogram examinations, most  Cardiology Progress Note  Patient ID: Devin Bullock MRN: [id number] DOB: 09-02-1965 Date of Encounter: 05/23/2023  Primary Cardiologist: None  Subjective   Chief Complaint: None.   HPI: No recurrence of arrhythmia.  Denies chest pain.  ROS:  All other ROS reviewed and negative. Pertinent positives noted in the HPI.     Inpatient Medications  Scheduled Meds:  calcitRIOL  2 mcg Oral Q M,W,F   Chlorhexidine Gluconate Cloth  6 each Topical Q0600   cinacalcet  60 mg Oral Q breakfast   heparin  5,000 Units Subcutaneous Q8H   metoprolol tartrate  12.5 mg Oral BID   mupirocin ointment  1 Application Nasal BID   sevelamer carbonate  3,200 mg Oral TID WC   Continuous Infusions:  PRN Meds: acetaminophen **OR** acetaminophen, calcium carbonate (dosed in mg elemental calcium), camphor-menthol **AND** hydrOXYzine, docusate sodium, feeding supplement (NEPRO CARB STEADY), guaiFENesin, [START ON 05/24/2023] heparin, hydrocortisone, metoprolol tartrate, ondansetron **OR** ondansetron (ZOFRAN) IV, mouth rinse, pentafluoroprop-tetrafluoroeth, sorbitol   Vital Signs   Vitals:   05/23/23 1052 05/23/23 1114 05/23/23 1130 05/23/23 1136  BP: (!) 85/54 103/61 (!) 84/64 95/63  Pulse: 99 97 (!) 101 (!) 107  Resp: 19 18 (!) 25 (!) 27  Temp:    97.6 F (36.4 C)  TempSrc:      SpO2: 99% 96% 96% 96%  Weight:      Height:        Intake/Output Summary (Last 24 hours) at 05/23/2023 1145 Last data filed at 05/23/2023 1136 Gross per 24 hour  Intake 240 ml  Output 2.5 ml  Net 237.5 ml      05/23/2023    8:15 AM 05/23/2023    5:20 AM 05/22/2023    5:00 AM  Last 3 Weights  Weight (lbs) 304 lb 7.3 oz 301 lb 5.9 oz 306 lb 10.6 oz  Weight (kg) 138.1 kg 136.7 kg 139.1 kg      Telemetry  Overnight telemetry shows sinus rhythm 90s, which I personally reviewed.   ECG  The most recent ECG shows ST 112 bpm, iRBBB, which I personally reviewed.   Physical Exam   Vitals:   05/23/23 1052 05/23/23 1114  05/23/23 1130 05/23/23 1136  BP: (!) 85/54 103/61 (!) 84/64 95/63  Pulse: 99 97 (!) 101 (!) 107  Resp: 19 18 (!) 25 (!) 27  Temp:    97.6 F (36.4 C)  TempSrc:      SpO2: 99% 96% 96% 96%  Weight:      Height:        Intake/Output Summary (Last 24 hours) at 05/23/2023 1145 Last data filed at 05/23/2023 1136 Gross per 24 hour  Intake 240 ml  Output 2.5 ml  Net 237.5 ml       05/23/2023    8:15 AM 05/23/2023    5:20 AM 05/22/2023    5:00 AM  Last 3 Weights  Weight (lbs) 304 lb 7.3 oz 301 lb 5.9 oz 306 lb 10.6 oz  Weight (kg) 138.1 kg 136.7 kg 139.1 kg    Body mass index is 37.06 kg/m.  General: Well nourished, well developed, in no acute distress Head: Atraumatic, normal size  Eyes: PEERLA, EOMI  Neck: Supple, no JVD Endocrine: No thryomegaly Cardiac: Normal S1, S2; RRR; no murmurs, rubs, or gallops Lungs: Clear to auscultation bilaterally, no wheezing, rhonchi or rales  Abd: Soft, nontender, no hepatomegaly  Ext: No edema, pulses 2+ Musculoskeletal: No deformities, BUE and BLE strength normal and equal Skin:  Warm and dry, no rashes   Neuro: Alert and oriented to person, place, time, and situation, CNII-XII grossly intact, no focal deficits  Psych: Normal mood and affect   Labs  High Sensitivity Troponin:   Recent Labs  Lab 05/21/23 1321 05/21/23 1500 05/21/23 1830 05/22/23 0030 05/22/23 0951  TROPONINIHS 91* 137* 298* 396* 291*     Cardiac EnzymesNo results for input(s): "TROPONINI" in the last 168 hours. No results for input(s): "TROPIPOC" in the last 168 hours.  Chemistry Recent Labs  Lab 05/21/23 1321 05/21/23 2211 05/22/23 0030 05/23/23 0232  NA 136  --  134* 131*  K 3.6  --  4.1 4.1  CL 93*  --  90* 89*  CO2 29  --  31 29  GLUCOSE 159*  --  138* 92  BUN 29*  --  36* 57*  CREATININE 6.58* 8.23* 8.39* 11.01*  CALCIUM 8.9  --  9.2 8.6*  PROT  --   --  6.9  --   ALBUMIN  --   --  3.1*  --   AST  --   --  21  --   ALT  --   --  16  --   ALKPHOS  --    --  78  --   BILITOT  --   --  0.7  --   GFRNONAA 9* 7* 7* 5*  ANIONGAP 14  --  13 13    Hematology Recent Labs  Lab 05/21/23 1321 05/21/23 2211 05/23/23 0232  WBC 10.0 8.3 7.2  RBC 3.17* 3.06* 2.51*  HGB 10.0* 9.5* 7.7*  HCT 30.0* 29.4* 24.3*  MCV 94.6 96.1 96.8  MCH 31.5 31.0 30.7  MCHC 33.3 32.3 31.7  RDW 15.6* 15.8* 15.8*  PLT 161 167 177   BNP Recent Labs  Lab 05/21/23 1321  BNP 147.1*    DDimer No results for input(s): "DDIMER" in the last 168 hours.   Radiology  ECHOCARDIOGRAM COMPLETE  Result Date: 05/22/2023    ECHOCARDIOGRAM REPORT   Patient Name:   Devin Bullock Date of Exam: 05/22/2023 Medical Rec #:  [id number]   Height:       76.0 in Accession #:    [id number]  Weight:       306.7 lb Date of Birth:  07-30-1966   BSA:          2.656 m Patient Age:    57 years    BP:           110/89 mmHg Patient Gender: M           HR:           97 bpm. Exam Location:  Inpatient Procedure: 2D Echo, Cardiac Doppler, Color Doppler and Intracardiac            Opacification Agent                                 MODIFIED REPORT:  This report was modified by Thomasene Ripple DO on 05/22/2023 due to ascending aorta                                    dimension.  Indications:     CHF- Acute Systolic  History:         Patient has prior history of Echocardiogram examinations, most  Cardiology Progress Note  Patient ID: Devin Bullock MRN: [id number] DOB: 09-02-1965 Date of Encounter: 05/23/2023  Primary Cardiologist: None  Subjective   Chief Complaint: None.   HPI: No recurrence of arrhythmia.  Denies chest pain.  ROS:  All other ROS reviewed and negative. Pertinent positives noted in the HPI.     Inpatient Medications  Scheduled Meds:  calcitRIOL  2 mcg Oral Q M,W,F   Chlorhexidine Gluconate Cloth  6 each Topical Q0600   cinacalcet  60 mg Oral Q breakfast   heparin  5,000 Units Subcutaneous Q8H   metoprolol tartrate  12.5 mg Oral BID   mupirocin ointment  1 Application Nasal BID   sevelamer carbonate  3,200 mg Oral TID WC   Continuous Infusions:  PRN Meds: acetaminophen **OR** acetaminophen, calcium carbonate (dosed in mg elemental calcium), camphor-menthol **AND** hydrOXYzine, docusate sodium, feeding supplement (NEPRO CARB STEADY), guaiFENesin, [START ON 05/24/2023] heparin, hydrocortisone, metoprolol tartrate, ondansetron **OR** ondansetron (ZOFRAN) IV, mouth rinse, pentafluoroprop-tetrafluoroeth, sorbitol   Vital Signs   Vitals:   05/23/23 1052 05/23/23 1114 05/23/23 1130 05/23/23 1136  BP: (!) 85/54 103/61 (!) 84/64 95/63  Pulse: 99 97 (!) 101 (!) 107  Resp: 19 18 (!) 25 (!) 27  Temp:    97.6 F (36.4 C)  TempSrc:      SpO2: 99% 96% 96% 96%  Weight:      Height:        Intake/Output Summary (Last 24 hours) at 05/23/2023 1145 Last data filed at 05/23/2023 1136 Gross per 24 hour  Intake 240 ml  Output 2.5 ml  Net 237.5 ml      05/23/2023    8:15 AM 05/23/2023    5:20 AM 05/22/2023    5:00 AM  Last 3 Weights  Weight (lbs) 304 lb 7.3 oz 301 lb 5.9 oz 306 lb 10.6 oz  Weight (kg) 138.1 kg 136.7 kg 139.1 kg      Telemetry  Overnight telemetry shows sinus rhythm 90s, which I personally reviewed.   ECG  The most recent ECG shows ST 112 bpm, iRBBB, which I personally reviewed.   Physical Exam   Vitals:   05/23/23 1052 05/23/23 1114  05/23/23 1130 05/23/23 1136  BP: (!) 85/54 103/61 (!) 84/64 95/63  Pulse: 99 97 (!) 101 (!) 107  Resp: 19 18 (!) 25 (!) 27  Temp:    97.6 F (36.4 C)  TempSrc:      SpO2: 99% 96% 96% 96%  Weight:      Height:        Intake/Output Summary (Last 24 hours) at 05/23/2023 1145 Last data filed at 05/23/2023 1136 Gross per 24 hour  Intake 240 ml  Output 2.5 ml  Net 237.5 ml       05/23/2023    8:15 AM 05/23/2023    5:20 AM 05/22/2023    5:00 AM  Last 3 Weights  Weight (lbs) 304 lb 7.3 oz 301 lb 5.9 oz 306 lb 10.6 oz  Weight (kg) 138.1 kg 136.7 kg 139.1 kg    Body mass index is 37.06 kg/m.  General: Well nourished, well developed, in no acute distress Head: Atraumatic, normal size  Eyes: PEERLA, EOMI  Neck: Supple, no JVD Endocrine: No thryomegaly Cardiac: Normal S1, S2; RRR; no murmurs, rubs, or gallops Lungs: Clear to auscultation bilaterally, no wheezing, rhonchi or rales  Abd: Soft, nontender, no hepatomegaly  Ext: No edema, pulses 2+ Musculoskeletal: No deformities, BUE and BLE strength normal and equal Skin:

## 2023-05-23 NOTE — Progress Notes (Signed)
PROGRESS NOTE  Devin Bullock  DOB: [birth year]  PCP: [name], MD ZOX:[id number]  DOA: 05/21/2023  LOS: 0 days  Hospital Day: 3  Brief narrative: Devin Bullock is a 57 y.o. male with PMH significant for ESRD-HD-MWF, chronic systolic CHF, obesity, HTN, parathyroid disease, 10/5, patient presented to the [facility] from dialysis with complaint of rapid heart rate.  He was in the middle of dialysis when they noted his heart rate as high as 192.  In the ED, he was given 6 mg of adenosine with improvement heart rate to 128 Seen by cardiology. On review of symptoms, patient stated he had rectal bleeding and hematuria over the last several days.  He was worked up by GI as an outpatient and has a scheduled EGD and colonoscopy in the next week.  For hematuria, he was being seen by urologist as an outpatient.  Labs with WC count 10, hemoglobin 10, platelet normal, troponin 91, lactic acid 2.1, BNP 147 CT angio chest did not show any pulm embolism.  It showed elevated left hemidiaphragm, with left lower lung atelectasis versus scarring.    Accepted for admission to [facility] After arrival to [facility], patient's heart rate went up to 170s.  He was given 6 mg adenosine IV and metoprolol 2.5 mg IV with improvement in heart rate to 110s.  Subjective: Patient was seen and examined this morning in dialysis unit. Not in distress. No recurrence of arrhythmia.  Assessment and plan: SVT Sinus tachycardia Adenosine sensitive SVT Seen by cardiology.  Started on metoprolol 12.5 mg twice daily No recurrence of arrhythmia. Echocardiogram with normal EF. TSH normal Electrolytes normal Recent Labs  Lab 05/21/23 1321 05/22/23 0030 05/23/23 0232  K 3.6 4.1 4.1  MG 2.0  --   --    Elevated troponin Troponin elevated to 97 uptrending to 396 this morning Likely demand ischemia in the setting of tachycardia, ESRD, systolic CHF Cardiology following  Recent Labs    05/21/23 1321 05/21/23 1500  05/21/23 1830 05/22/23 0030 05/22/23 0951  TROPONINIHS 91* 137* 298* 396* 291*   Hypotension  H/o chronic systolic CHF, Dilated cardiomyopathy  PTA meds- none Blood pressure low in 80s this morning.  Echo is better now at 50 to 55%. Patient apparently has been coming of blood pressure medicine over the past few years.  May need midodrine.  Cough Seems to have significant intermittent dry/wet cough. CT imaging without much edema or consolidation. Started Robitussin DM as needed  ESRD-HD-MWF Patient states that his dialysis schedule is 3 days a week, MWF.  However he was sick and could not go for dialysis on Friday and hence he was at dialysis on Saturday.  He states he completed his dialysis yesterday.  Does not look volume overloaded Nephrologist consulted  Intermittent hemorrhoidal bleeding Follows with lab for GI as outpatient.  Has plans for outpatient EGD colonoscopy in next 3 week Currently reports intermittent hemorrhoidal bleeding  Hematuria Outpatient urology evaluation in process with his urologist at [facility] Currently has intermittent bleeding from urethral meatus. Inpatient workup not needed.  Follow-up with urology as an outpatient.  Anemia of chronic disease Hemoglobin trend as below close to 10, stable, in the setting of ESRD, intermittent GI bleeding and hematuria Continue to monitor Recent Labs    03/16/23 2012 04/26/23 1240 05/21/23 1321 05/21/23 2211 05/23/23 0232  HGB 10.7* 10.1* 10.0* 9.5* 7.7*  MCV 95.5 95.2 94.6 96.1 96.8   Goiter CT chest on admission showed goiter with multiple  thyroid nodules measuring up to at least 3 cm in size. Recommend thyroid ultrasound as an outpatient  Obesity  Body mass index is 37.06 kg/m. Patient has been advised to make an attempt to improve diet and exercise patterns to aid in weight loss.  Mobility: Encourage ambulation.  Independently ambulates at home.  Lives alone  Goals of care   Code Status: Full Code      DVT prophylaxis:  heparin injection 5,000 Units Start: 05/21/23 2200   Antimicrobials: None Fluid: None Consultants: Nephrology, cardiology Family Communication: None at bedside  Status: Observation Level of care:  Telemetry Cardiac   Patient is from: Home Needs to continue in-hospital care: Repeat hemoglobin tomorrow.  If stable, plan to discharge home tomorrow. Anticipated d/c to: Pending clinical course      Diet:  Diet Order             Diet renal with fluid restriction Fluid restriction: 1200 mL Fluid; Room service appropriate? Yes; Fluid consistency: Thin  Diet effective now                   Scheduled Meds:  atorvastatin  40 mg Oral Daily   calcitRIOL  2 mcg Oral Q M,W,F   Chlorhexidine Gluconate Cloth  6 each Topical Q0600   cinacalcet  60 mg Oral Q breakfast   heparin  5,000 Units Subcutaneous Q8H   metoprolol tartrate  12.5 mg Oral BID   mupirocin ointment  1 Application Nasal BID   sevelamer carbonate  3,200 mg Oral TID WC    PRN meds: acetaminophen **OR** acetaminophen, calcium carbonate (dosed in mg elemental calcium), camphor-menthol **AND** hydrOXYzine, docusate sodium, feeding supplement (NEPRO CARB STEADY), guaiFENesin, hydrocortisone, metoprolol tartrate, ondansetron **OR** ondansetron (ZOFRAN) IV, mouth rinse, sorbitol   Infusions:    Antimicrobials: Anti-infectives (From admission, onward)    None       Objective: Vitals:   05/23/23 1130 05/23/23 1136  BP: (!) 84/64 104/83  Pulse: (!) 101 (!) 107  Resp: (!) 25 (!) 27  Temp:  97.6 F (36.4 C)  SpO2: 96% 96%    Intake/Output Summary (Last 24 hours) at 05/23/2023 1502 Last data filed at 05/23/2023 1136 Gross per 24 hour  Intake 240 ml  Output 2.5 ml  Net 237.5 ml   Filed Weights   05/22/23 0500 05/23/23 0520 05/23/23 0815  Weight: (!) 139.1 kg (!) 136.7 kg (!) 138.1 kg   Weight change: 1.4 kg Body mass index is 37.06 kg/m.   Physical Exam: General exam:  Pleasant middle-aged African-American male. Skin: No rashes, lesions or ulcers. HEENT: Atraumatic, normocephalic, no obvious bleeding Lungs: Clear to auscultation bilaterally CVS: Regular rate and rhythm, no murmur GI/Abd soft, nontender, nondistended, bowel sound present CNS: Alert, awake, oriented x 3 Psychiatry: Mood appropriate Extremities: No pedal edema, no reference  Data Review: I have personally reviewed the laboratory data and studies available.  F/u labs ordered Unresulted Labs (From admission, onward)     Start     Ordered   05/23/23 0500  Basic metabolic panel  Daily,   R      05/22/23 1441   05/23/23 0500  CBC with Differential/Platelet  Daily,   R      05/22/23 1441   05/22/23 1854  Hepatitis B surface antibody,quantitative  (New Admission Hemo Labs (Hepatitis B))  Once,   R        05/22/23 1855            Total time  spent in review of labs and imaging, patient evaluation, formulation of plan, documentation and communication with family: 45 minutes  Signed, Lorin Glass, MD [facility] 05/23/2023

## 2023-05-23 NOTE — Plan of Care (Signed)

## 2023-05-23 NOTE — Progress Notes (Signed)
[facility] Progress Note  Subjective: pt seen after HD in pt room.  No c/o's today.   Vitals:   05/23/23 1130 05/23/23 1136 05/23/23 1527 05/23/23 2009  BP: (!) 84/64 104/83 101/63 116/62  Pulse: (!) 101 (!) 107 90 100  Resp: (!) 25 (!) 27 17 15   Temp:  97.6 F (36.4 C) 98 F (36.7 C) 98.3 F (36.8 C)  TempSrc:   Oral Oral  SpO2: 96% 96% 97% 95%  Weight:      Height:        Exam: Gen alert, no distress, male AA No jvd or bruits Chest clear bilat to bases RRR no RG Abd soft ntnd no mass or ascites +bs Ext no LE or UE edema Neuro is alert, Ox 3 , nf    LFA AVF +bruit       Renal-related home meds: - sevelamer 3-4 ac tid - sensipar 60mg  qam - others: anusol supp, prns      OP HD: [facility] MWF  4.5h   450/1.5   135kg   2/2 bath  AVF  Heparin 2800 - last OP HD Sat 10/05 off schedule (missed Friday), post wt 135.2kg  - rocaltrol 2 mcg po - venofer 50mg  weekly IV - mircera 100 mcg q 2 wks, due 10/02 (?given) - good compliance     Assessment/ Plan: SVT - w/ HR 180s, adenosine sensitive. Started on metoprolol 12.5 bid. Echo showed normal EF. TSH wnl. Per pmd/ cardiology.  ESRD - on HD MWF. Last HD was Sat (he missed Friday). Next HD monday HTN - bp's running normal 120/ 80 range. Not on any BP lowering meds Volume - Got 2.5 L off despite soft BP's on HD today.  Anemia esrd - Hb 10 here, no esa needed. Hb dropped so will give him an esa as he missed his last outpatient mircera which was due on 10/02.  MBD ckd - CCa in range, add on phos. Cont renvela, sensipar and po vdra.  Chronic systolic CHF - last EF 35-40%, repeat pending.          Devin Moselle MD  [facility] 05/23/2023, 9:04 PM  Recent Labs  Lab 05/21/23 2211 05/22/23 0030 05/23/23 0232  HGB 9.5*  --  7.7*  ALBUMIN  --  3.1*  --   CALCIUM  --  9.2 8.6*  CREATININE 8.23* 8.39* 11.01*  K  --  4.1 4.1   No results for input(s): "IRON", "TIBC", "FERRITIN" in the last 168 hours. Inpatient  medications:  atorvastatin  40 mg Oral Daily   calcitRIOL  2 mcg Oral Q M,W,F   Chlorhexidine Gluconate Cloth  6 each Topical Q0600   cinacalcet  60 mg Oral Q breakfast   heparin  5,000 Units Subcutaneous Q8H   metoprolol tartrate  12.5 mg Oral BID   mupirocin ointment  1 Application Nasal BID   sevelamer carbonate  3,200 mg Oral TID WC    acetaminophen **OR** acetaminophen, calcium carbonate (dosed in mg elemental calcium), camphor-menthol **AND** hydrOXYzine, docusate sodium, feeding supplement (NEPRO CARB STEADY), guaiFENesin, hydrocortisone, metoprolol tartrate, ondansetron **OR** ondansetron (ZOFRAN) IV, mouth rinse, sorbitol

## 2023-05-23 NOTE — Progress Notes (Signed)
Mobility Specialist Progress Note:    05/23/23 1423  Mobility  Activity Ambulated with assistance in room  Level of Assistance Standby assist, set-up cues, supervision of patient - no hands on  Assistive Device Front wheel walker  Distance Ambulated (ft) 30 ft  Range of Motion/Exercises Active;All extremities  Activity Response Tolerated well  Mobility Referral Yes  $Mobility charge 1 Mobility  Mobility Specialist Start Time (ACUTE ONLY) 1415  Mobility Specialist Stop Time (ACUTE ONLY) 1423  Mobility Specialist Time Calculation (min) (ACUTE ONLY) 8 min   Pt received in bed, agreeable to mobility session. Ambulated in room with RW and SBA for safety. Tolerated well, asx throughout. Assisted pt to chair, all needs met, call bell in reach.   Feliciana Rossetti Mobility Specialist Please contact via Special educational needs teacher or  Rehab office at [phone]

## 2023-05-24 DIAGNOSIS — I471 Supraventricular tachycardia, unspecified: Secondary | ICD-10-CM | POA: Diagnosis not present

## 2023-05-24 LAB — BASIC METABOLIC PANEL
Anion gap: 13 (ref 5–15)
BUN: 38 mg/dL — ABNORMAL HIGH (ref 6–20)
CO2: 29 mmol/L (ref 22–32)
Calcium: 8.9 mg/dL (ref 8.9–10.3)
Chloride: 90 mmol/L — ABNORMAL LOW (ref 98–111)
Creatinine, Ser: 8.61 mg/dL — ABNORMAL HIGH (ref 0.61–1.24)
GFR, Estimated: 7 mL/min — ABNORMAL LOW (ref >60)
Glucose, Bld: 95 mg/dL (ref 70–99)
Potassium: 4.3 mmol/L (ref 3.5–5.1)
Sodium: 132 mmol/L — ABNORMAL LOW (ref 135–145)

## 2023-05-24 LAB — CBC WITH DIFFERENTIAL/PLATELET
Abs Immature Granulocytes: 0.02 10*3/uL (ref 0.00–0.07)
Basophils Absolute: 0 10*3/uL (ref 0.0–0.1)
Basophils Relative: 1 %
Eosinophils Absolute: 0.2 10*3/uL (ref 0.0–0.5)
Eosinophils Relative: 3 %
HCT: 25.9 % — ABNORMAL LOW (ref 39.0–52.0)
Hemoglobin: 8.2 g/dL — ABNORMAL LOW (ref 13.0–17.0)
Immature Granulocytes: 0 %
Lymphocytes Relative: 18 %
Lymphs Abs: 1.2 10*3/uL (ref 0.7–4.0)
MCH: 30.5 pg (ref 26.0–34.0)
MCHC: 31.7 g/dL (ref 30.0–36.0)
MCV: 96.3 fL (ref 80.0–100.0)
Monocytes Absolute: 0.9 10*3/uL (ref 0.1–1.0)
Monocytes Relative: 14 %
Neutro Abs: 4.3 10*3/uL (ref 1.7–7.7)
Neutrophils Relative %: 64 %
Platelets: 199 10*3/uL (ref 150–400)
RBC: 2.69 MIL/uL — ABNORMAL LOW (ref 4.22–5.81)
RDW: 15.8 % — ABNORMAL HIGH (ref 11.5–15.5)
WBC: 6.6 10*3/uL (ref 4.0–10.5)
nRBC: 0 % (ref 0.0–0.2)

## 2023-05-24 LAB — PHOSPHORUS: Phosphorus: 4.6 mg/dL (ref 2.5–4.6)

## 2023-05-24 LAB — HEPATITIS B SURFACE ANTIBODY, QUANTITATIVE: Hep B S AB Quant (Post): 3777 m[IU]/mL

## 2023-05-24 MED ORDER — CHLORHEXIDINE GLUCONATE CLOTH 2 % EX PADS
6.0000 | MEDICATED_PAD | Freq: Every day | CUTANEOUS | Status: DC
  Administered 2023-05-25: 6 via TOPICAL

## 2023-05-24 NOTE — Progress Notes (Signed)
Mobility Specialist Progress Note:    05/24/23 1430  Mobility  Activity Ambulated with assistance in hallway  Level of Assistance Contact guard assist, steadying assist  Assistive Device Front wheel walker  Distance Ambulated (ft) 275 ft  Range of Motion/Exercises Active;All extremities  Activity Response Tolerated well  Mobility Referral Yes  $Mobility charge 1 Mobility  Mobility Specialist Start Time (ACUTE ONLY) 1430  Mobility Specialist Stop Time (ACUTE ONLY) 1445  Mobility Specialist Time Calculation (min) (ACUTE ONLY) 15 min   Pt agreeable to mobility session. Ambulated in hallway with RW for safety. Tolerated well, asx throughout. Returned pt to room, all needs met.  Feliciana Rossetti Mobility Specialist Please contact via Special educational needs teacher or  Rehab office at [phone]

## 2023-05-24 NOTE — Progress Notes (Signed)
[facility] Progress Note  Subjective: pt seen in room. No CP or SOB.   Vitals:   05/24/23 0313 05/24/23 0559 05/24/23 0743 05/24/23 1151  BP: 110/74  104/89 106/70  Pulse:   90 84  Resp: 17  19 20   Temp: 97.6 F (36.4 C)  98.1 F (36.7 C) 98 F (36.7 C)  TempSrc: Oral  Oral Oral  SpO2: 95%  95%   Weight:  134.6 kg    Height:        Exam: Gen alert, no distress, male AA No jvd or bruits Chest clear bilat to bases RRR no RG Abd soft ntnd no mass or ascites +bs Ext no LE or UE edema Neuro is alert, Ox 3 , nf    LFA AVF +bruit       Renal-related home meds: - sevelamer 3-4 ac tid - sensipar 60mg  qam - others: anusol supp, prns      OP HD: [facility] M[facility]  4.5h   450/1.5   135kg   2/2 bath  AVF  Heparin 2800 - last OP HD Sat 10/05 off schedule (missed Friday), post wt 135.2kg  - rocaltrol 2 mcg  - venofer 50mg  weekly IV - mircera 100 mcg q 2 wks, due 10/02 (?given) - good compliance     CXR 10/05 - CM, no edema   Assessment/ Plan: SVT - w/ HR 180s, adenosine sensitive. Started on metoprolol 12.5 bid. Echo showed normal EF. TSH wnl. Per pmd/ cardiology.  ESRD - on HD MWF. Had HD here yesterday. Next HD Wednesday.  HTN - bp's low normal today. He is not on any BP lowering meds at home.  Volume - no edema on exam, or initial CXR. No SOB. On RA. Close to dry wt. Small UF w/ next HD.  Anemia esrd - Hb 10 initially then dropped to mid 7s then today is 8s. Missed recent esa dose at OP unit, so we started him on darbe sq weekly last night.  MBD ckd - CCa and phos are in range. Cont renvela, sensipar and po vdra.  Chronic systolic CHF - last EF 35-40%. Repeat echo showed EF 50-55%.           [name] MD  [facility] 05/24/2023, 1:18 PM  Recent Labs  Lab 05/22/23 0030 05/23/23 0232 05/23/23 2351 05/24/23 0217  HGB  --  7.7*  --  8.2*  ALBUMIN 3.1*  --   --   --   CALCIUM 9.2 8.6*  --  8.9  PHOS  --   --  4.6  --   CREATININE 8.39* 11.01*  --  8.61*   K 4.1 4.1  --  4.3   No results for input(s): "IRON", "TIBC", "FERRITIN" in the last 168 hours. Inpatient medications:  atorvastatin  40 mg Oral Daily   calcitRIOL  2 mcg Oral Q M,W,F   Chlorhexidine Gluconate Cloth  6 each Topical Q0600   cinacalcet  60 mg Oral Q breakfast   darbepoetin (ARANESP) injection - DIALYSIS  100 mcg Subcutaneous Q Mon-1800   metoprolol tartrate  12.5 mg Oral BID   mupirocin ointment  1 Application Nasal BID   sevelamer carbonate  3,200 mg Oral TID WC    acetaminophen **OR** acetaminophen, calcium carbonate (dosed in mg elemental calcium), camphor-menthol **AND** hydrOXYzine, docusate sodium, feeding supplement (NEPRO CARB STEADY), guaiFENesin, hydrocortisone, metoprolol tartrate, ondansetron **OR** ondansetron (ZOFRAN) IV, mouth rinse, sorbitol

## 2023-05-24 NOTE — Progress Notes (Signed)
PROGRESS NOTE  Devin Bullock  DOB: Jan 22, 1966  PCP: [name], MD ZOX:[id number]  DOA: 05/21/2023  LOS: 0 days  Hospital Day: 4  Brief narrative: Devin Bullock is a 57 y.o. male with PMH significant for ESRD-HD-MWF, chronic systolic CHF, obesity, HTN, parathyroid disease, 10/5, patient presented to [facility] from dialysis with complaint of rapid heart rate.  He was in the middle of dialysis when they noted his heart rate as high as 192.  In the ED, he was given 6 mg of adenosine with improvement heart rate to 128 Seen by cardiology. On review of symptoms, patient stated he had rectal bleeding and hematuria over the last several days.  He was worked up by GI as an outpatient and has a scheduled EGD and colonoscopy in the next week.  For hematuria, he was being seen by urologist as an outpatient.  Labs with WC count 10, hemoglobin 10, platelet normal, troponin 91, lactic acid 2.1, BNP 147 CT angio chest did not show any pulm embolism.  It showed elevated left hemidiaphragm, with left lower lung atelectasis versus scarring.    Accepted for admission to [facility] After arrival to [facility], patient's heart rate went up to 170s.  He was given 6 mg adenosine IV and metoprolol 2.5 mg IV with improvement in heart rate to 110s.  Subjective: Patient was seen and examined this morning  Lying on bed.  Continues to have fresh blood and clots come out of urethra.  Hemoglobin dropped and blood work yesterday. No bowel movement in 3 days.   Not in distress. No recurrence of arrhythmia.  Assessment and plan: SVT Sinus tachycardia Adenosine sensitive SVT Seen by cardiology.  Started on metoprolol 12.5 mg twice daily No recurrence of arrhythmia. Echocardiogram with normal EF. TSH normal Electrolytes normal.  No further intervention planned by cardiology. Recent Labs  Lab 05/21/23 1321 05/22/23 0030 05/23/23 0232 05/23/23 2351 05/24/23 0217  K 3.6 4.1 4.1  --  4.3  MG 2.0  --   --    --   --   PHOS  --   --   --  4.6  --    Elevated troponin Troponin elevated.  Trend as below. Likely demand ischemia in the setting of tachycardia, ESRD, systolic CHF Not a candidate for invasive angiography. Recent Labs    05/21/23 1500 05/21/23 1830 05/22/23 0030 05/22/23 0951  TROPONINIHS 137* 298* 396* 291*   Hypotension  H/o chronic systolic CHF, Dilated cardiomyopathy  PTA meds- none Blood pressure low in 80s this morning.  Echo is better now at 50 to 55%. Patient apparently has been coming off blood pressure medicine over the past few years.  May need midodrine.  Cough Seems to have significant intermittent dry/wet cough. CT imaging without much edema or consolidation. Started Robitussin DM as needed  ESRD-HD-MWF Patient states that his dialysis schedule is 3 days a week, MWF.  However he was sick and could not go for dialysis on Friday and hence he was at dialysis on Saturday.  He states he completed his dialysis yesterday.  Does not look volume overloaded Nephrologist consulted  Hematuria Bladder cancer Follows up with [facility] as an outpatient for intermittent cystoscopy and bladder chemo. Lately, patient has been having more episodes of bleeding.  He is wanting to be seen by urology this hospital stay.  I called for consult today.  Intermittent hemorrhoidal bleeding Follows with lab for GI as outpatient.  Has plans for outpatient EGD  colonoscopy in next 3 week Currently reports intermittent hemorrhoidal bleeding  Anemia of chronic disease Hemoglobin trend as below close to 10, stable, in the setting of ESRD, intermittent GI bleeding and hematuria Continue to monitor.  Transfuse if less than 7 Recent Labs    04/26/23 1240 05/21/23 1321 05/21/23 2211 05/23/23 0232 05/24/23 0217  HGB 10.1* 10.0* 9.5* 7.7* 8.2*  MCV 95.2 94.6 96.1 96.8 96.3   Goiter CT chest on admission showed goiter with multiple thyroid nodules measuring up to at least 3 cm in  size. Recommend thyroid ultrasound as an outpatient  Obesity  Body mass index is 36.12 kg/m. Patient has been advised to make an attempt to improve diet and exercise patterns to aid in weight loss.  Mobility: Encourage ambulation.  Independently ambulates at home.  Lives alone  Goals of care   Code Status: Full Code     DVT prophylaxis:  Place and maintain sequential compression device Start: 05/24/23 1118   Antimicrobials: None Fluid: None Consultants: Nephrology, cardiology, urology Family Communication: None at bedside  Status: Observation Level of care:  Telemetry Cardiac   Patient is from: Home Needs to continue in-hospital care: Pending urology evaluation Anticipated d/c to: Pending clinical course      Diet:  Diet Order             Diet renal with fluid restriction Fluid restriction: 1200 mL Fluid; Room service appropriate? Yes; Fluid consistency: Thin  Diet effective now                   Scheduled Meds:  atorvastatin  40 mg Oral Daily   calcitRIOL  2 mcg Oral Q M,W,F   Chlorhexidine Gluconate Cloth  6 each Topical Q0600   [START ON 05/25/2023] Chlorhexidine Gluconate Cloth  6 each Topical Q0600   cinacalcet  60 mg Oral Q breakfast   darbepoetin (ARANESP) injection - DIALYSIS  100 mcg Subcutaneous Q Mon-1800   metoprolol tartrate  12.5 mg Oral BID   mupirocin ointment  1 Application Nasal BID   sevelamer carbonate  3,200 mg Oral TID WC    PRN meds: acetaminophen **OR** acetaminophen, calcium carbonate (dosed in mg elemental calcium), camphor-menthol **AND** hydrOXYzine, docusate sodium, feeding supplement (NEPRO CARB STEADY), guaiFENesin, hydrocortisone, metoprolol tartrate, ondansetron **OR** ondansetron (ZOFRAN) IV, mouth rinse, sorbitol   Infusions:    Antimicrobials: Anti-infectives (From admission, onward)    None       Objective: Vitals:   05/24/23 0743 05/24/23 1151  BP: 104/89 106/70  Pulse: 90 84  Resp: 19 20  Temp: 98.1 F  (36.7 C) 98 F (36.7 C)  SpO2: 95%     Intake/Output Summary (Last 24 hours) at 05/24/2023 1428 Last data filed at 05/24/2023 0746 Gross per 24 hour  Intake 360 ml  Output --  Net 360 ml   Filed Weights   05/23/23 0520 05/23/23 0815 05/24/23 0559  Weight: (!) 136.7 kg (!) 138.1 kg 134.6 kg   Weight change: 1.4 kg Body mass index is 36.12 kg/m.   Physical Exam: General exam: Pleasant middle-aged African-American male. Skin: No rashes, lesions or ulcers. HEENT: Atraumatic, normocephalic, no obvious bleeding Lungs: Clear to auscultation bilaterally CVS: Regular rate and rhythm, no murmur GI/Abd soft, nontender, nondistended, bowel sound present CNS: Alert, awake, oriented x 3 Psychiatry: Mood appropriate Extremities: No pedal edema, no calf tenderness  Data Review: I have personally reviewed the laboratory data and studies available.  F/u labs ordered Unresulted Labs (From admission, onward)  Start     Ordered   05/23/23 0500  Basic metabolic panel  Daily,   R      05/22/23 1441   05/23/23 0500  CBC with Differential/Platelet  Daily,   R      05/22/23 1441            Total time spent in review of labs and imaging, patient evaluation, formulation of plan, documentation and communication with family: 45 minutes  Signed, Lorin Glass, MD [facility] 05/24/2023

## 2023-05-24 NOTE — Plan of Care (Signed)

## 2023-05-24 NOTE — Consult Note (Signed)
Urology Consult Note   Requesting Attending Physician:  Lorin Glass, MD Service Providing Consult: Urology  Consulting Attending: Dr. Alvester Morin   Reason for Consult:  Hematuria  HPI: Devin Bullock is seen in consultation for reasons noted above at the request of Lorin Glass, MD. patient is followed by [facility] and he has been cared for by Dr. Mickey Farber and Dr. Joya Martyr.  PMH significant for malignancy of the bladder-overlapping sites, s/p gemcitabine installations.  He also carries a history of Gleason 4+4=8 prostate cancer and is s/p XRT.  Patient reports that he is anuric but has had intermittent hematuria for the last 5 to 6 months, with significant worsening over the last 3 weeks.  Patient reports the worst cases of this or following catheterization for his gemcitabine installations and after his cystoscopy.  Patient's hemoglobin initially dropped but then has rebounded.  Per nephrology's note, he missed his ESA dose which has since been provided. ------------------  Assessment:  57 y.o. male with hematuria with reported passage of BRBPR and blood mixed with consolidated clot material occasionally from urethra.    Recommendations: #hematuria # Radiation cystitis # Bladder cancer # Prostate cancer  Radiation cystitis is very difficult to keep under control by itself. With concurrent bladder cancer, heparin administration, and platelet aggregation issues commonly found in ESRD patients, I would not be surprised if he continues to have some degree of ongoing bleeding.  Patient is anuric so unfortunately it is impossible to obtain a urine sample.  Note from his last cystoscopy on 11/01/2022 notes radiation cystitis, bladder diverticulum, and no evidence of recurrence.  Urine cytology had no signs of urothelial carcinoma at this time.  Patient describes the majority of his bleeding coming after anything invasive, i.e. Foley catheter placement or cystoscopy.  Therefore I would not  recommend proceeding with direct visualization unless fulguration is ultimately required.  Hopefully the discontinuation of the heparin will help control his hematuria and he can follow-up with his regular urologist with [facility].  I will see him again tomorrow.  Case and plan discussed with Dr. Alvester Morin  Past Medical History: Past Medical History:  Diagnosis Date   Anemia    AV fistula (HCC)    Cancer (HCC)    prostate and bladder cancer - 2022 diagnosed   CHF (congestive heart failure) (HCC)    patient denies   Chronic kidney disease    Dialysis patient [facility])    History of blood transfusion 03/2020   Hypertension    history of, no longer taking medication at this time   Morbid obesity (HCC)    Parathyroid disorder [facility])     Past Surgical History:  Past Surgical History:  Procedure Laterality Date   AV FISTULA PLACEMENT Left 05/24/2014   Procedure: BRACHIOCEPHALIC ARTERIOVENOUS (AV) FISTULA CREATION;  Surgeon: Chuck Hint, MD;  Location: [facility] OR;  Service: Vascular;  Laterality: Left;   ESOPHAGOGASTRODUODENOSCOPY (EGD) WITH PROPOFOL N/A 04/03/2020   Procedure: ESOPHAGOGASTRODUODENOSCOPY (EGD) WITH PROPOFOL;  Surgeon: Kerin Salen, MD;  Location: [facility] ENDOSCOPY;  Service: Gastroenterology;  Laterality: N/A;   PROSTATE BIOPSY N/A 11/27/2020   Procedure: BIOPSY TRANSRECTAL ULTRASONIC PROSTATE (TUBP);  Surgeon: Crista Elliot, MD;  Location: [facility] ORS;  Service: Urology;  Laterality: N/A;  REQUESTING 84 MINS FOR CASE   REVISON OF ARTERIOVENOUS FISTULA Left 01/08/2021   Procedure: REVISON PLICATION OF ARTERIOVENOUS FISTULA LEFT;  Surgeon: Maeola Harman, MD;  Location: [facility] OR;  Service: Vascular;  Laterality: Left;   TRANSURETHRAL RESECTION  OF BLADDER TUMOR N/A 11/27/2020   Procedure: TRANSURETHRAL RESECTION OF BLADDER TUMOR (TURBT);  Surgeon: Crista Elliot, MD;  Location: [facility] ORS;  Service: Urology;  Laterality: N/A;  NEED PARALYSIS    Medication: Current  Facility-Administered Medications  Medication Dose Route Frequency Provider Last Rate Last Admin   acetaminophen (TYLENOL) tablet 650 mg  650 mg Oral Q6H PRN Rometta Emery, MD       Or   acetaminophen (TYLENOL) suppository 650 mg  650 mg Rectal Q6H PRN Rometta Emery, MD       atorvastatin (LIPITOR) tablet 40 mg  40 mg Oral Daily O'Neal, Ronnald Ramp, MD   40 mg at 05/24/23 0102   calcitRIOL (ROCALTROL) capsule 2 mcg  2 mcg Oral Q M,W,F Delano Metz, MD   2 mcg at 05/23/23 0854   calcium carbonate (dosed in mg elemental calcium) suspension 500 mg of elemental calcium  500 mg of elemental calcium Oral Q6H PRN Rometta Emery, MD       camphor-menthol (SARNA) lotion 1 Application  1 Application Topical Q8H PRN Rometta Emery, MD       And   hydrOXYzine (ATARAX) tablet 25 mg  25 mg Oral Q8H PRN Rometta Emery, MD       Chlorhexidine Gluconate Cloth 2 % PADS 6 each  6 each Topical Q0600 Delano Metz, MD   6 each at 05/24/23 0533   [START ON 05/25/2023] Chlorhexidine Gluconate Cloth 2 % PADS 6 each  6 each Topical Q0600 Delano Metz, MD       cinacalcet (SENSIPAR) tablet 60 mg  60 mg Oral Q breakfast Earlie Lou L, MD   60 mg at 05/24/23 0617   Darbepoetin Alfa (ARANESP) injection 100 mcg  100 mcg Subcutaneous Q Mon-1800 Delano Metz, MD   100 mcg at 05/23/23 2232   docusate sodium (ENEMEEZ) enema 283 mg  1 enema Rectal PRN Rometta Emery, MD       feeding supplement (NEPRO CARB STEADY) liquid 237 mL  237 mL Oral TID PRN Rometta Emery, MD       guaiFENesin (ROBITUSSIN) 100 MG/5ML liquid 5 mL  5 mL Oral Q6H PRN Dahal, Melina Schools, MD   5 mL at 05/24/23 0939   hydrocortisone (ANUSOL-HC) suppository 25 mg  25 mg Rectal QHS PRN Rometta Emery, MD       metoprolol tartrate (LOPRESSOR) injection 5 mg  5 mg Intravenous Q4H PRN Howerter, Justin B, DO       metoprolol tartrate (LOPRESSOR) tablet 12.5 mg  12.5 mg Oral BID Earlie Lou L, MD   12.5 mg at 05/24/23 0857    mupirocin ointment (BACTROBAN) 2 % 1 Application  1 Application Nasal BID Rometta Emery, MD   1 Application at 05/23/23 2233   ondansetron (ZOFRAN) tablet 4 mg  4 mg Oral Q6H PRN Rometta Emery, MD       Or   ondansetron (ZOFRAN) injection 4 mg  4 mg Intravenous Q6H PRN Rometta Emery, MD       Oral care mouth rinse  15 mL Mouth Rinse PRN Rometta Emery, MD       sevelamer carbonate (RENVELA) tablet 3,200 mg  3,200 mg Oral TID WC Garba, Mohammad L, MD   3,200 mg at 05/24/23 1145   sorbitol 70 % solution 30 mL  30 mL Oral PRN Rometta Emery, MD        Allergies: No Known Allergies  Social History: Social History   Tobacco Use   Smoking status: Never    Passive exposure: Never   Smokeless tobacco: Never  Vaping Use   Vaping status: Never Used  Substance Use Topics   Alcohol use: No   Drug use: No    Family History Family History  Problem Relation Age of Onset   Diabetes Mother    Hypertension Mother    Heart attack Mother 27   Stroke Father    Hypertension Father    Depression Father    Diabetes Brother    Hypertension Brother    Stroke Brother 48   Alcohol abuse Brother    Asthma Brother    Colon cancer Neg Hx    Esophageal cancer Neg Hx    Stomach cancer Neg Hx     Review of Systems  Genitourinary:  Positive for hematuria.     Objective   Vital signs in last 24 hours: BP 106/70 (BP Location: Right Arm)   Pulse 84   Temp 98 F (36.7 C) (Oral)   Resp 20   Ht 6\' 4"  (1.93 m)   Wt 134.6 kg   SpO2 95%   BMI 36.12 kg/m   Physical Exam General: NAD, A&O, resting, appropriate HEENT: [place]/AT Pulmonary: Normal work of breathing Cardiovascular: Fistula on left arm, no cyanosis Abdomen: Soft, NTTP, nondistended GU: No anatomic abnormalities Neuro: Appropriate, no focal neurological deficits  Most Recent Labs: Lab Results  Component Value Date   WBC 6.6 05/24/2023   HGB 8.2 (L) 05/24/2023   HCT 25.9 (L) 05/24/2023   PLT 199 05/24/2023     Lab Results  Component Value Date   NA 132 (L) 05/24/2023   K 4.3 05/24/2023   CL 90 (L) 05/24/2023   CO2 29 05/24/2023   BUN 38 (H) 05/24/2023   CREATININE 8.61 (H) 05/24/2023   CALCIUM 8.9 05/24/2023   MG 2.0 05/21/2023   PHOS 4.6 05/23/2023    Lab Results  Component Value Date   INR 1.3 (H) 03/16/2023   APTT 39 (H) 04/02/2020     Urine Culture: @[id number] (laburin,org,[id number])@   IMAGING: ECHOCARDIOGRAM COMPLETE  Result Date: 05/22/2023    ECHOCARDIOGRAM REPORT   Patient Name:   Devin Bullock Date of Exam: 05/22/2023 Medical Rec #:  [id number]   Height:       76.0 in Accession #:    [id number]  Weight:       306.7 lb Date of Birth:  [birth year]   BSA:          2.656 m Patient Age:    57 years    BP:           110/89 mmHg Patient Gender: M           HR:           97 bpm. Exam Location:  Inpatient Procedure: 2D Echo, Cardiac Doppler, Color Doppler and Intracardiac            Opacification Agent                                 MODIFIED REPORT:  This report was modified by Thomasene Ripple DO on 05/22/2023 due to ascending aorta                                    dimension.  Indications:     CHF- Acute Systolic  History:         Patient has prior history of Echocardiogram examinations, most                  recent 04/13/2013. CHF and Cardiomyopathy; Risk                  Factors:Hypertension. Chronic Kidney Disease.  Sonographer:     Raeford Razor RDCS Referring Phys:  [id number] Gwenyth Bouillon L GARBA Diagnosing Phys: Lavona Mound Tobb DO  Sonographer Comments: Technically difficult study due to poor echo windows and patient is obese. Image acquisition challenging due to patient body habitus. IMPRESSIONS  1. Left ventricular ejection fraction, by estimation, is 50 to 55%. The left ventricle has low normal function. The left ventricle has no regional wall motion abnormalities. There is severe concentric left ventricular hypertrophy. Left ventricular diastolic parameters are indeterminate.  2. Right ventricular  systolic function is normal. The right ventricular size is normal. Tricuspid regurgitation signal is inadequate for assessing PA pressure.  3. Left atrial size was severely dilated.  4. Moderate pericardial effusion. The pericardial effusion is localized near the right atrium. There is no evidence of cardiac tamponade.  5. The mitral valve is normal in structure. Mild mitral valve regurgitation. No evidence of mitral stenosis.  6. The aortic valve is calcified. Aortic valve regurgitation is not visualized. Mild aortic valve stenosis. Aortic valve area, by VTI measures 2.08 cm. Aortic valve mean gradient measures 17.0 mmHg. Aortic valve Vmax measures 2.52 m/s.  7. There is moderate dilatation of the ascending aorta, measuring 43 mm.  8. The inferior vena cava is normal in size with greater than 50% respiratory variability, suggesting right atrial pressure of 3 mmHg. FINDINGS  Left Ventricle: Left ventricular ejection fraction, by estimation, is 50 to 55%. The left ventricle has low normal function. The left ventricle has no regional wall motion abnormalities. Definity contrast agent was given IV to delineate the left ventricular endocardial borders. The left ventricular internal cavity size was normal in size. There is severe concentric left ventricular hypertrophy. Left ventricular diastolic parameters are indeterminate. Right Ventricle: The right ventricular size is normal. No increase in right ventricular wall thickness. Right ventricular systolic function is normal. Tricuspid regurgitation signal is inadequate for assessing PA pressure. Left Atrium: Left atrial size was severely dilated. Right Atrium: Right atrial size was normal in size. Pericardium: A moderately sized pericardial effusion is present. The pericardial effusion is localized near the right atrium. There is diastolic collapse of the right atrial wall. There is no evidence of cardiac tamponade. Mitral Valve: The mitral valve is normal in structure.  Mild mitral valve regurgitation. No evidence of mitral valve stenosis. Tricuspid Valve: The tricuspid valve is normal in structure. Tricuspid valve regurgitation is not demonstrated. No evidence of tricuspid stenosis. Aortic Valve: The aortic valve is calcified. Aortic valve regurgitation is not visualized. Mild aortic stenosis is present. Aortic valve mean gradient measures 17.0 mmHg. Aortic valve peak gradient measures 25.4 mmHg. Aortic valve area, by VTI measures 2.08 cm. Pulmonic Valve: The pulmonic valve was normal in structure. Pulmonic valve regurgitation is not visualized. No evidence of pulmonic stenosis. Aorta: There is moderate dilatation of the ascending aorta, measuring 43 mm. Venous: The inferior vena cava is normal in size with greater than 50% respiratory variability, suggesting right atrial pressure of 3 mmHg. IAS/Shunts: No atrial level shunt detected by color flow Doppler.  LEFT VENTRICLE PLAX 2D LVIDd:  5.80 cm      Diastology LVIDs:         4.50 cm      LV e' medial:    5.77 cm/s LV PW:         1.60 cm      LV E/e' medial:  19.6 LV IVS:        1.50 cm      LV e' lateral:   7.07 cm/s LVOT diam:     2.50 cm      LV E/e' lateral: 16.0 LV SV:         97 LV SV Index:   37 LVOT Area:     4.91 cm  LV Volumes (MOD) LV vol d, MOD A2C: 123.0 ml LV vol d, MOD A4C: 156.0 ml LV vol s, MOD A2C: 59.9 ml LV vol s, MOD A4C: 77.7 ml LV SV MOD A2C:     63.1 ml LV SV MOD A4C:     156.0 ml LV SV MOD BP:      72.0 ml RIGHT VENTRICLE             IVC RV Basal diam:  3.70 cm     IVC diam: 1.70 cm RV Mid diam:    2.70 cm RV S prime:     16.40 cm/s TAPSE (M-mode): 1.7 cm LEFT ATRIUM              Index        RIGHT ATRIUM           Index LA diam:        5.90 cm  2.22 cm/m   RA Area:     21.80 cm LA Vol (A2C):   117.0 ml 44.04 ml/m  RA Volume:   62.60 ml  23.56 ml/m LA Vol (A4C):   143.0 ml 53.83 ml/m LA Biplane Vol: 131.0 ml 49.31 ml/m  AORTIC VALVE AV Area (Vmax):    2.44 cm AV Area (Vmean):   2.00 cm AV  Area (VTI):     2.08 cm AV Vmax:           252.00 cm/s AV Vmean:          201.000 cm/s AV VTI:            0.469 m AV Peak Grad:      25.4 mmHg AV Mean Grad:      17.0 mmHg LVOT Vmax:         125.50 cm/s LVOT Vmean:        81.700 cm/s LVOT VTI:          0.198 m LVOT/AV VTI ratio: 0.42  AORTA Ao Root diam: 3.00 cm Ao Asc diam:  4.30 cm MITRAL VALVE MV Area (PHT): 4.77 cm     SHUNTS MV Decel Time: 159 msec     Systemic VTI:  0.20 m MV E velocity: 113.00 cm/s  Systemic Diam: 2.50 cm Kardie Tobb DO Electronically signed by Thomasene Ripple DO Signature Date/Time: 05/22/2023/4:16:54 PM    Final (Updated)     ------  Elmon Kirschner, NP Pager: [phone]   Please contact the urology consult pager with any further questions/concerns.

## 2023-05-25 ENCOUNTER — Encounter (HOSPITAL_COMMUNITY): Payer: Self-pay | Admitting: *Deleted

## 2023-05-25 ENCOUNTER — Other Ambulatory Visit (HOSPITAL_COMMUNITY): Payer: Self-pay

## 2023-05-25 DIAGNOSIS — I471 Supraventricular tachycardia, unspecified: Secondary | ICD-10-CM | POA: Diagnosis not present

## 2023-05-25 LAB — CBC WITH DIFFERENTIAL/PLATELET
Abs Immature Granulocytes: 0.02 10*3/uL (ref 0.00–0.07)
Basophils Absolute: 0 10*3/uL (ref 0.0–0.1)
Basophils Relative: 1 %
Eosinophils Absolute: 0.3 10*3/uL (ref 0.0–0.5)
Eosinophils Relative: 4 %
HCT: 25.1 % — ABNORMAL LOW (ref 39.0–52.0)
Hemoglobin: 8.1 g/dL — ABNORMAL LOW (ref 13.0–17.0)
Immature Granulocytes: 0 %
Lymphocytes Relative: 21 %
Lymphs Abs: 1.2 10*3/uL (ref 0.7–4.0)
MCH: 30.6 pg (ref 26.0–34.0)
MCHC: 32.3 g/dL (ref 30.0–36.0)
MCV: 94.7 fL (ref 80.0–100.0)
Monocytes Absolute: 0.8 10*3/uL (ref 0.1–1.0)
Monocytes Relative: 15 %
Neutro Abs: 3.3 10*3/uL (ref 1.7–7.7)
Neutrophils Relative %: 59 %
Platelets: 193 10*3/uL (ref 150–400)
RBC: 2.65 MIL/uL — ABNORMAL LOW (ref 4.22–5.81)
RDW: 15.4 % (ref 11.5–15.5)
WBC: 5.6 10*3/uL (ref 4.0–10.5)
nRBC: 0 % (ref 0.0–0.2)

## 2023-05-25 LAB — BASIC METABOLIC PANEL
Anion gap: 14 (ref 5–15)
BUN: 58 mg/dL — ABNORMAL HIGH (ref 6–20)
CO2: 29 mmol/L (ref 22–32)
Calcium: 8.9 mg/dL (ref 8.9–10.3)
Chloride: 88 mmol/L — ABNORMAL LOW (ref 98–111)
Creatinine, Ser: 11.13 mg/dL — ABNORMAL HIGH (ref 0.61–1.24)
GFR, Estimated: 5 mL/min — ABNORMAL LOW (ref >60)
Glucose, Bld: 80 mg/dL (ref 70–99)
Potassium: 4.6 mmol/L (ref 3.5–5.1)
Sodium: 131 mmol/L — ABNORMAL LOW (ref 135–145)

## 2023-05-25 MED ORDER — CAMPHOR-MENTHOL 0.5-0.5 % EX LOTN: 1.0000 | TOPICAL_LOTION | Freq: Three times a day (TID) | CUTANEOUS | 0 refills | Status: DC | PRN

## 2023-05-25 MED ORDER — METOPROLOL TARTRATE 25 MG PO TABS
12.5000 mg | ORAL_TABLET | Freq: Two times a day (BID) | ORAL | 2 refills | Status: DC
  Filled 2023-05-25: qty 60, 60d supply, fill #0

## 2023-05-25 MED ORDER — ANTICOAGULANT SODIUM CITRATE 4% (200MG/5ML) IV SOLN: 5.0000 mL | Status: DC | PRN

## 2023-05-25 MED ORDER — HYDROXYZINE HCL 25 MG PO TABS: 25.0000 mg | ORAL_TABLET | Freq: Three times a day (TID) | ORAL | 0 refills | Status: DC | PRN

## 2023-05-25 MED ORDER — HEPARIN SODIUM (PORCINE) 1000 UNIT/ML DIALYSIS
2000.0000 [IU] | Freq: Once | INTRAMUSCULAR | Status: DC
  Filled 2023-05-25: qty 2

## 2023-05-25 MED ORDER — HEPARIN SODIUM (PORCINE) 1000 UNIT/ML DIALYSIS: 1500.0000 [IU] | INTRAMUSCULAR | Status: DC | PRN

## 2023-05-25 MED ORDER — HEPARIN SODIUM (PORCINE) 1000 UNIT/ML DIALYSIS: 1000.0000 [IU] | INTRAMUSCULAR | Status: DC | PRN

## 2023-05-25 MED ORDER — LIDOCAINE HCL (PF) 1 % IJ SOLN: 5.0000 mL | INTRAMUSCULAR | Status: DC | PRN

## 2023-05-25 MED ORDER — ATORVASTATIN CALCIUM 40 MG PO TABS: 40.0000 mg | ORAL_TABLET | Freq: Every day | ORAL | 2 refills | Status: DC

## 2023-05-25 MED ORDER — LIDOCAINE-PRILOCAINE 2.5-2.5 % EX CREA: 1.0000 | TOPICAL_CREAM | CUTANEOUS | Status: DC | PRN

## 2023-05-25 MED ORDER — ACETAMINOPHEN 325 MG PO TABS: 650.0000 mg | ORAL_TABLET | Freq: Four times a day (QID) | ORAL | Status: DC | PRN

## 2023-05-25 MED ORDER — GUAIFENESIN ER 600 MG PO TB12
600.0000 mg | ORAL_TABLET | Freq: Two times a day (BID) | ORAL | 1 refills | Status: DC | PRN
  Filled 2023-05-25: qty 20, 10d supply, fill #0

## 2023-05-25 MED ORDER — PENTAFLUOROPROP-TETRAFLUOROETH EX AERO: 1.0000 | INHALATION_SPRAY | CUTANEOUS | Status: DC | PRN

## 2023-05-25 MED ORDER — CAMPHOR-MENTHOL 0.5-0.5 % EX LOTN
1.0000 | TOPICAL_LOTION | Freq: Three times a day (TID) | CUTANEOUS | 0 refills | Status: DC | PRN
  Filled 2023-05-25: qty 222, 74d supply, fill #0

## 2023-05-25 MED ORDER — GUAIFENESIN ER 600 MG PO TB12: 600.0000 mg | ORAL_TABLET | Freq: Two times a day (BID) | ORAL | Status: DC | PRN

## 2023-05-25 MED ORDER — NEPRO/CARBSTEADY PO LIQD: 237.0000 mL | ORAL | Status: DC | PRN

## 2023-05-25 MED ORDER — ATORVASTATIN CALCIUM 40 MG PO TABS
40.0000 mg | ORAL_TABLET | Freq: Every day | ORAL | 2 refills | Status: AC
  Filled 2023-05-25: qty 30, 30d supply, fill #0

## 2023-05-25 MED ORDER — ALTEPLASE 2 MG IJ SOLR: 2.0000 mg | Freq: Once | INTRAMUSCULAR | Status: DC | PRN

## 2023-05-25 MED ORDER — HYDROXYZINE HCL 25 MG PO TABS
25.0000 mg | ORAL_TABLET | Freq: Three times a day (TID) | ORAL | 0 refills | Status: DC | PRN
  Filled 2023-05-25: qty 30, 10d supply, fill #0

## 2023-05-25 NOTE — TOC CM/SW Note (Addendum)
Was secure chatted and asked to order 4 wheeled rolling walker with a seat . MD ordered. NCM called Mitch with [employer] to bring to room.   Mitch asking if discharge is today. Secure chatted MD , awaiting response . Discharge is today. Mitch with [employer] aware and will have rollator delivered to room prior to discharge today

## 2023-05-25 NOTE — Plan of Care (Signed)

## 2023-05-25 NOTE — Discharge Instructions (Signed)
1) please follow-up with your urology and oncology teams at [facility] 2) please continue hemodialysis on Mondays Wednesdays and Fridays 3) please repeat your CBC and BMP blood test within a week 4)Avoid ibuprofen/Advil/Aleve/Motrin/Goody Powders/Naproxen/BC powders/Meloxicam/Diclofenac/Indomethacin and other Nonsteroidal anti-inflammatory medications as these will make you more likely to bleed and can cause stomach ulcers

## 2023-05-25 NOTE — Progress Notes (Signed)
Received patient in bed to unit.  Alert and oriented.  Informed consent signed and in chart.   TX duration:3  Patient tolerated well.  Transported back to the room  Alert, without acute distress.  Hand-off given to patient's nurse.   Access used: AVF Access issues: n/a  Total UF removed: 2L Medication(s) given: n/a Post HD weight: 136.2kg   05/25/23 1529  Vitals  Temp 98.1 F (36.7 C)  Temp Source Oral  BP 111/66  MAP (mmHg) 81  Pulse Rate 91  ECG Heart Rate 91  Resp (!) 21  Oxygen Therapy  SpO2 99 %  During Treatment Monitoring  Blood Flow Rate (mL/min) 399 mL/min  Arterial Pressure (mmHg) -243.63 mmHg  Venous Pressure (mmHg) 325.84 mmHg  TMP (mmHg) 13.73 mmHg  Ultrafiltration Rate (mL/min) 809 mL/min  Dialysate Flow Rate (mL/min) 300 ml/min  Dialysate Potassium Concentration 2  Dialysate Calcium Concentration 2.5  Duration of HD Treatment -hour(s) 2.99 hour(s)  Cumulative Fluid Removed (mL) per Treatment  8295.62  HD Safety Checks Performed Yes  Intra-Hemodialysis Comments Tx completed  Post Treatment  Dialyzer Clearance Lightly streaked  Hemodialysis Intake (mL) 60 mL  Liters Processed 72  Fluid Removed (mL) 2000 mL  Tolerated HD Treatment Yes  AVG/AVF Arterial Site Held (minutes) 5 minutes  AVG/AVF Venous Site Held (minutes) 5 minutes  Fistula / Graft Left Forearm Arteriovenous fistula  Placement Date/Time: (c) 05/24/14 (c) 1026   Placed prior to admission: No  Orientation: Left  Access Location: Forearm  Access Type: (c) Arteriovenous fistula  Site Condition No complications  Fistula / Graft Assessment Present;Thrill;Bruit  Status Accessed  Needle Size 15  Drainage Description None      [facility]

## 2023-05-25 NOTE — Discharge Summary (Signed)
Devin Bullock, is a 57 y.o. male  DOB 1965-12-16  MRN [id number].  Admission date:  05/21/2023  Admitting Physician  Devin Hanlon, MD  Discharge Date:  05/25/2023   Primary MD  Bullock, Devin Fearing, MD  Recommendations for primary care physician for things to follow:  1) please follow-up with your urology and oncology teams at [facility] 2) please continue hemodialysis on Mondays Wednesdays and Fridays 3) please repeat your CBC and BMP blood test within a week 4)Avoid ibuprofen/Advil/Aleve/Motrin/Goody Powders/Naproxen/BC powders/Meloxicam/Diclofenac/Indomethacin and other Nonsteroidal anti-inflammatory medications as these will make you more likely to bleed and can cause stomach ulcers   Admission Diagnosis  Sinus tachycardia [R00.0] SVT (supraventricular tachycardia) (HCC) [I47.10] Multiple thyroid nodules [E04.2]   Discharge Diagnosis  Sinus tachycardia [R00.0] SVT (supraventricular tachycardia) (HCC) [I47.10] Multiple thyroid nodules [E04.2]    Principal Problem:   SVT (supraventricular tachycardia) (HCC) Active Problems:   Patient nonadherence   Hypertensive emergency   Morbid obesity (HCC)   Anemia   ESRD (end stage renal disease) on dialysis [facility])      Past Medical History:  Diagnosis Date   Anemia    AV fistula (HCC)    Cancer (HCC)    prostate and bladder cancer - 2022 diagnosed   CHF (congestive heart failure) (HCC)    patient denies   Chronic kidney disease    Dialysis patient [facility])    History of blood transfusion 03/2020   Hypertension    history of, no longer taking medication at this time   Morbid obesity (HCC)    Parathyroid disorder [facility])     Past Surgical History:  Procedure Laterality Date   AV FISTULA PLACEMENT Left 05/24/2014   Procedure: BRACHIOCEPHALIC ARTERIOVENOUS (AV) FISTULA CREATION;  Surgeon: Devin Hint, MD;  Location: [facility] OR;  Service: Vascular;   Laterality: Left;   ESOPHAGOGASTRODUODENOSCOPY (EGD) WITH PROPOFOL N/A 04/03/2020   Procedure: ESOPHAGOGASTRODUODENOSCOPY (EGD) WITH PROPOFOL;  Surgeon: Devin Salen, MD;  Location: [facility] ENDOSCOPY;  Service: Gastroenterology;  Laterality: N/A;   PROSTATE BIOPSY N/A 11/27/2020   Procedure: BIOPSY TRANSRECTAL ULTRASONIC PROSTATE (TUBP);  Surgeon: Devin Elliot, MD;  Location: [facility] ORS;  Service: Urology;  Laterality: N/A;  REQUESTING 15 MINS FOR CASE   REVISON OF ARTERIOVENOUS FISTULA Left 01/08/2021   Procedure: REVISON PLICATION OF ARTERIOVENOUS FISTULA LEFT;  Surgeon: Devin Harman, MD;  Location: [facility] OR;  Service: Vascular;  Laterality: Left;   TRANSURETHRAL RESECTION OF BLADDER TUMOR N/A 11/27/2020   Procedure: TRANSURETHRAL RESECTION OF BLADDER TUMOR (TURBT);  Surgeon: Devin Elliot, MD;  Location: [facility] ORS;  Service: Urology;  Laterality: N/A;  NEED PARALYSIS     HPI  from the history and physical done on the day of admission:   HPI: Devin Bullock is a 57 y.o. male with medical history significant of end-stage renal disease on hemodialysis TTS, heart failure with reduced ejection fraction with last echo in 2014, morbid obesity, parathyroid disease, essential hypertension, who was seen at [facility] after going to dialysis where he  For home use only DME 4 wheeled rolling walker with seat  Once       Question:  Patient needs a walker to treat with the following condition  Answer:  Weakness   05/25/23 1450           Major procedures and Radiology Reports - PLEASE review detailed and final reports for all details, in brief -   ECHOCARDIOGRAM COMPLETE  Result Date: 05/22/2023    ECHOCARDIOGRAM REPORT   Patient Name:   Devin Bullock Date of Exam: 05/22/2023 Medical Rec #:  [id number]   Height:       76.0 in Accession #:    [id number]  Weight:       306.7 lb Date of Birth:  02-Mar-1966   BSA:          2.656 m Patient Age:    57 years    BP:           110/89 mmHg Patient Gender: M           HR:           97 bpm. Exam Location:  Inpatient Procedure: 2D Echo, Cardiac Doppler, Color Doppler and Intracardiac            Opacification Agent                                 MODIFIED REPORT:  This report was modified by Devin Ripple DO on 05/22/2023 due to ascending aorta                                    dimension.  Indications:     CHF- Acute Systolic  History:         Patient has prior history of Echocardiogram examinations, most                  recent 04/13/2013. CHF and Cardiomyopathy; Risk                  Factors:Hypertension. Chronic Kidney Disease.  Sonographer:     Raeford Razor RDCS Referring Phys:  [id number] Devin Bullock Diagnosing Phys: Devin [place] Tobb DO  Sonographer Comments: Technically difficult study due to poor echo windows and patient is obese. Image acquisition challenging due to patient body habitus. IMPRESSIONS  1. Left ventricular ejection fraction, by estimation, is 50 to 55%. The left ventricle has low normal function. The left ventricle has no regional wall motion abnormalities.  There is severe concentric left ventricular hypertrophy. Left ventricular diastolic parameters are indeterminate.  2. Right ventricular systolic function is normal. The right ventricular size is normal. Tricuspid regurgitation signal is inadequate for assessing PA pressure.  3. Left atrial size was severely dilated.  4. Moderate pericardial effusion. The pericardial effusion is localized near the right atrium. There is no evidence of cardiac tamponade.  5. The mitral valve is normal in structure. Mild mitral valve regurgitation. No evidence of mitral stenosis.  6. The aortic valve is calcified. Aortic valve regurgitation is not visualized. Mild aortic valve stenosis. Aortic valve area, by VTI measures 2.08 cm. Aortic valve mean gradient measures 17.0 mmHg. Aortic valve Vmax measures 2.52 m/s.  7. There is moderate dilatation of the ascending aorta, measuring 43 mm.  8. The inferior vena cava is normal in size with greater than 50% respiratory variability, suggesting  For home use only DME 4 wheeled rolling walker with seat  Once       Question:  Patient needs a walker to treat with the following condition  Answer:  Weakness   05/25/23 1450           Major procedures and Radiology Reports - PLEASE review detailed and final reports for all details, in brief -   ECHOCARDIOGRAM COMPLETE  Result Date: 05/22/2023    ECHOCARDIOGRAM REPORT   Patient Name:   Devin Bullock Date of Exam: 05/22/2023 Medical Rec #:  [id number]   Height:       76.0 in Accession #:    [id number]  Weight:       306.7 lb Date of Birth:  02-Mar-1966   BSA:          2.656 m Patient Age:    57 years    BP:           110/89 mmHg Patient Gender: M           HR:           97 bpm. Exam Location:  Inpatient Procedure: 2D Echo, Cardiac Doppler, Color Doppler and Intracardiac            Opacification Agent                                 MODIFIED REPORT:  This report was modified by Devin Ripple DO on 05/22/2023 due to ascending aorta                                    dimension.  Indications:     CHF- Acute Systolic  History:         Patient has prior history of Echocardiogram examinations, most                  recent 04/13/2013. CHF and Cardiomyopathy; Risk                  Factors:Hypertension. Chronic Kidney Disease.  Sonographer:     Raeford Razor RDCS Referring Phys:  [id number] Devin Bullock Diagnosing Phys: Devin [place] Tobb DO  Sonographer Comments: Technically difficult study due to poor echo windows and patient is obese. Image acquisition challenging due to patient body habitus. IMPRESSIONS  1. Left ventricular ejection fraction, by estimation, is 50 to 55%. The left ventricle has low normal function. The left ventricle has no regional wall motion abnormalities.  There is severe concentric left ventricular hypertrophy. Left ventricular diastolic parameters are indeterminate.  2. Right ventricular systolic function is normal. The right ventricular size is normal. Tricuspid regurgitation signal is inadequate for assessing PA pressure.  3. Left atrial size was severely dilated.  4. Moderate pericardial effusion. The pericardial effusion is localized near the right atrium. There is no evidence of cardiac tamponade.  5. The mitral valve is normal in structure. Mild mitral valve regurgitation. No evidence of mitral stenosis.  6. The aortic valve is calcified. Aortic valve regurgitation is not visualized. Mild aortic valve stenosis. Aortic valve area, by VTI measures 2.08 cm. Aortic valve mean gradient measures 17.0 mmHg. Aortic valve Vmax measures 2.52 m/s.  7. There is moderate dilatation of the ascending aorta, measuring 43 mm.  8. The inferior vena cava is normal in size with greater than 50% respiratory variability, suggesting  For home use only DME 4 wheeled rolling walker with seat  Once       Question:  Patient needs a walker to treat with the following condition  Answer:  Weakness   05/25/23 1450           Major procedures and Radiology Reports - PLEASE review detailed and final reports for all details, in brief -   ECHOCARDIOGRAM COMPLETE  Result Date: 05/22/2023    ECHOCARDIOGRAM REPORT   Patient Name:   Devin Bullock Date of Exam: 05/22/2023 Medical Rec #:  [id number]   Height:       76.0 in Accession #:    [id number]  Weight:       306.7 lb Date of Birth:  02-Mar-1966   BSA:          2.656 m Patient Age:    57 years    BP:           110/89 mmHg Patient Gender: M           HR:           97 bpm. Exam Location:  Inpatient Procedure: 2D Echo, Cardiac Doppler, Color Doppler and Intracardiac            Opacification Agent                                 MODIFIED REPORT:  This report was modified by Devin Ripple DO on 05/22/2023 due to ascending aorta                                    dimension.  Indications:     CHF- Acute Systolic  History:         Patient has prior history of Echocardiogram examinations, most                  recent 04/13/2013. CHF and Cardiomyopathy; Risk                  Factors:Hypertension. Chronic Kidney Disease.  Sonographer:     Raeford Razor RDCS Referring Phys:  [id number] Devin Bullock Diagnosing Phys: Devin [place] Tobb DO  Sonographer Comments: Technically difficult study due to poor echo windows and patient is obese. Image acquisition challenging due to patient body habitus. IMPRESSIONS  1. Left ventricular ejection fraction, by estimation, is 50 to 55%. The left ventricle has low normal function. The left ventricle has no regional wall motion abnormalities.  There is severe concentric left ventricular hypertrophy. Left ventricular diastolic parameters are indeterminate.  2. Right ventricular systolic function is normal. The right ventricular size is normal. Tricuspid regurgitation signal is inadequate for assessing PA pressure.  3. Left atrial size was severely dilated.  4. Moderate pericardial effusion. The pericardial effusion is localized near the right atrium. There is no evidence of cardiac tamponade.  5. The mitral valve is normal in structure. Mild mitral valve regurgitation. No evidence of mitral stenosis.  6. The aortic valve is calcified. Aortic valve regurgitation is not visualized. Mild aortic valve stenosis. Aortic valve area, by VTI measures 2.08 cm. Aortic valve mean gradient measures 17.0 mmHg. Aortic valve Vmax measures 2.52 m/s.  7. There is moderate dilatation of the ascending aorta, measuring 43 mm.  8. The inferior vena cava is normal in size with greater than 50% respiratory variability, suggesting  For home use only DME 4 wheeled rolling walker with seat  Once       Question:  Patient needs a walker to treat with the following condition  Answer:  Weakness   05/25/23 1450           Major procedures and Radiology Reports - PLEASE review detailed and final reports for all details, in brief -   ECHOCARDIOGRAM COMPLETE  Result Date: 05/22/2023    ECHOCARDIOGRAM REPORT   Patient Name:   Devin Bullock Date of Exam: 05/22/2023 Medical Rec #:  [id number]   Height:       76.0 in Accession #:    [id number]  Weight:       306.7 lb Date of Birth:  02-Mar-1966   BSA:          2.656 m Patient Age:    57 years    BP:           110/89 mmHg Patient Gender: M           HR:           97 bpm. Exam Location:  Inpatient Procedure: 2D Echo, Cardiac Doppler, Color Doppler and Intracardiac            Opacification Agent                                 MODIFIED REPORT:  This report was modified by Devin Ripple DO on 05/22/2023 due to ascending aorta                                    dimension.  Indications:     CHF- Acute Systolic  History:         Patient has prior history of Echocardiogram examinations, most                  recent 04/13/2013. CHF and Cardiomyopathy; Risk                  Factors:Hypertension. Chronic Kidney Disease.  Sonographer:     Raeford Razor RDCS Referring Phys:  [id number] Devin Bullock Diagnosing Phys: Devin [place] Tobb DO  Sonographer Comments: Technically difficult study due to poor echo windows and patient is obese. Image acquisition challenging due to patient body habitus. IMPRESSIONS  1. Left ventricular ejection fraction, by estimation, is 50 to 55%. The left ventricle has low normal function. The left ventricle has no regional wall motion abnormalities.  There is severe concentric left ventricular hypertrophy. Left ventricular diastolic parameters are indeterminate.  2. Right ventricular systolic function is normal. The right ventricular size is normal. Tricuspid regurgitation signal is inadequate for assessing PA pressure.  3. Left atrial size was severely dilated.  4. Moderate pericardial effusion. The pericardial effusion is localized near the right atrium. There is no evidence of cardiac tamponade.  5. The mitral valve is normal in structure. Mild mitral valve regurgitation. No evidence of mitral stenosis.  6. The aortic valve is calcified. Aortic valve regurgitation is not visualized. Mild aortic valve stenosis. Aortic valve area, by VTI measures 2.08 cm. Aortic valve mean gradient measures 17.0 mmHg. Aortic valve Vmax measures 2.52 m/s.  7. There is moderate dilatation of the ascending aorta, measuring 43 mm.  8. The inferior vena cava is normal in size with greater than 50% respiratory variability, suggesting  Devin Bullock, is a 57 y.o. male  DOB 1965-12-16  MRN [id number].  Admission date:  05/21/2023  Admitting Physician  Devin Hanlon, MD  Discharge Date:  05/25/2023   Primary MD  Bullock, Devin Fearing, MD  Recommendations for primary care physician for things to follow:  1) please follow-up with your urology and oncology teams at [facility] 2) please continue hemodialysis on Mondays Wednesdays and Fridays 3) please repeat your CBC and BMP blood test within a week 4)Avoid ibuprofen/Advil/Aleve/Motrin/Goody Powders/Naproxen/BC powders/Meloxicam/Diclofenac/Indomethacin and other Nonsteroidal anti-inflammatory medications as these will make you more likely to bleed and can cause stomach ulcers   Admission Diagnosis  Sinus tachycardia [R00.0] SVT (supraventricular tachycardia) (HCC) [I47.10] Multiple thyroid nodules [E04.2]   Discharge Diagnosis  Sinus tachycardia [R00.0] SVT (supraventricular tachycardia) (HCC) [I47.10] Multiple thyroid nodules [E04.2]    Principal Problem:   SVT (supraventricular tachycardia) (HCC) Active Problems:   Patient nonadherence   Hypertensive emergency   Morbid obesity (HCC)   Anemia   ESRD (end stage renal disease) on dialysis [facility])      Past Medical History:  Diagnosis Date   Anemia    AV fistula (HCC)    Cancer (HCC)    prostate and bladder cancer - 2022 diagnosed   CHF (congestive heart failure) (HCC)    patient denies   Chronic kidney disease    Dialysis patient [facility])    History of blood transfusion 03/2020   Hypertension    history of, no longer taking medication at this time   Morbid obesity (HCC)    Parathyroid disorder [facility])     Past Surgical History:  Procedure Laterality Date   AV FISTULA PLACEMENT Left 05/24/2014   Procedure: BRACHIOCEPHALIC ARTERIOVENOUS (AV) FISTULA CREATION;  Surgeon: Devin Hint, MD;  Location: [facility] OR;  Service: Vascular;   Laterality: Left;   ESOPHAGOGASTRODUODENOSCOPY (EGD) WITH PROPOFOL N/A 04/03/2020   Procedure: ESOPHAGOGASTRODUODENOSCOPY (EGD) WITH PROPOFOL;  Surgeon: Devin Salen, MD;  Location: [facility] ENDOSCOPY;  Service: Gastroenterology;  Laterality: N/A;   PROSTATE BIOPSY N/A 11/27/2020   Procedure: BIOPSY TRANSRECTAL ULTRASONIC PROSTATE (TUBP);  Surgeon: Devin Elliot, MD;  Location: [facility] ORS;  Service: Urology;  Laterality: N/A;  REQUESTING 15 MINS FOR CASE   REVISON OF ARTERIOVENOUS FISTULA Left 01/08/2021   Procedure: REVISON PLICATION OF ARTERIOVENOUS FISTULA LEFT;  Surgeon: Devin Harman, MD;  Location: [facility] OR;  Service: Vascular;  Laterality: Left;   TRANSURETHRAL RESECTION OF BLADDER TUMOR N/A 11/27/2020   Procedure: TRANSURETHRAL RESECTION OF BLADDER TUMOR (TURBT);  Surgeon: Devin Elliot, MD;  Location: [facility] ORS;  Service: Urology;  Laterality: N/A;  NEED PARALYSIS     HPI  from the history and physical done on the day of admission:   HPI: Devin Bullock is a 57 y.o. male with medical history significant of end-stage renal disease on hemodialysis TTS, heart failure with reduced ejection fraction with last echo in 2014, morbid obesity, parathyroid disease, essential hypertension, who was seen at [facility] after going to dialysis where he  For home use only DME 4 wheeled rolling walker with seat  Once       Question:  Patient needs a walker to treat with the following condition  Answer:  Weakness   05/25/23 1450           Major procedures and Radiology Reports - PLEASE review detailed and final reports for all details, in brief -   ECHOCARDIOGRAM COMPLETE  Result Date: 05/22/2023    ECHOCARDIOGRAM REPORT   Patient Name:   Devin Bullock Date of Exam: 05/22/2023 Medical Rec #:  [id number]   Height:       76.0 in Accession #:    [id number]  Weight:       306.7 lb Date of Birth:  02-Mar-1966   BSA:          2.656 m Patient Age:    57 years    BP:           110/89 mmHg Patient Gender: M           HR:           97 bpm. Exam Location:  Inpatient Procedure: 2D Echo, Cardiac Doppler, Color Doppler and Intracardiac            Opacification Agent                                 MODIFIED REPORT:  This report was modified by Devin Ripple DO on 05/22/2023 due to ascending aorta                                    dimension.  Indications:     CHF- Acute Systolic  History:         Patient has prior history of Echocardiogram examinations, most                  recent 04/13/2013. CHF and Cardiomyopathy; Risk                  Factors:Hypertension. Chronic Kidney Disease.  Sonographer:     Raeford Razor RDCS Referring Phys:  [id number] Devin Bullock Diagnosing Phys: Devin [place] Tobb DO  Sonographer Comments: Technically difficult study due to poor echo windows and patient is obese. Image acquisition challenging due to patient body habitus. IMPRESSIONS  1. Left ventricular ejection fraction, by estimation, is 50 to 55%. The left ventricle has low normal function. The left ventricle has no regional wall motion abnormalities.  There is severe concentric left ventricular hypertrophy. Left ventricular diastolic parameters are indeterminate.  2. Right ventricular systolic function is normal. The right ventricular size is normal. Tricuspid regurgitation signal is inadequate for assessing PA pressure.  3. Left atrial size was severely dilated.  4. Moderate pericardial effusion. The pericardial effusion is localized near the right atrium. There is no evidence of cardiac tamponade.  5. The mitral valve is normal in structure. Mild mitral valve regurgitation. No evidence of mitral stenosis.  6. The aortic valve is calcified. Aortic valve regurgitation is not visualized. Mild aortic valve stenosis. Aortic valve area, by VTI measures 2.08 cm. Aortic valve mean gradient measures 17.0 mmHg. Aortic valve Vmax measures 2.52 m/s.  7. There is moderate dilatation of the ascending aorta, measuring 43 mm.  8. The inferior vena cava is normal in size with greater than 50% respiratory variability, suggesting

## 2023-05-25 NOTE — Progress Notes (Signed)
[facility] Progress Note  Subjective: pt seen in HD unit.   Vitals:   05/25/23 1250 05/25/23 1253 05/25/23 1300 05/25/23 1320  BP: 119/68   104/67  Pulse: 85  80 85  Resp: (!) 25  (!) 21 (!) 21  Temp:      TempSrc:      SpO2: 94%  95% 94%  Weight:  (!) 137.6 kg    Height:        Exam: Gen alert, no distress No jvd or bruits Chest clear bilat to bases RRR no RG Abd soft ntnd no mass or ascites +bs Ext no LE or UE edema Neuro is alert, Ox 3 , nf    LFA AVF +bruit       Renal-related home meds: - sevelamer 3-4 ac tid - sensipar 60mg  qam - others: anusol supp, prns      OP HD: [facility] MWF  4.5h   450/1.5   135kg   2/2 bath  AVF  Heparin 2800 - last OP HD Sat 10/05 off schedule (missed Friday), post wt 135.2kg  - rocaltrol 2 mcg  - venofer 50mg  weekly IV - mircera 100 mcg q 2 wks, due 10/02 (?given) - good compliance     CXR 10/05 - CM, no edema   Assessment/ Plan: SVT - w/ HR 180s, adenosine sensitive. Started on metoprolol 12.5 bid. Echo showed normal EF. TSH wnl. Resolved. Per pmd/ cardiology.  ESRD - on HD MWF. Had HD here Monday. HD today.  HTN - bp's low normal today. He is not on any BP lowering meds  Volume - no edema on exam, or initial CXR. No SOB. On RA. 2 L UF goal w/ HD today.  Anemia esrd - Hb 10 initially then dropped to mid 7s then today is 8s. Missed recent esa dose at OP unit, so we started him on darbe sq weekly last night.  MBD ckd - CCa and phos are in range. Cont renvela, sensipar and po vdra.  Chronic systolic CHF - last EF 35-40%. Repeat echo showed EF 50-55%.   Hematuria - gross w/ clots and blood. Hx of bladder cancer. Urology states that most recent eval showed radiation cystitis w/ no evidence of recurrent urothelial cancer.    Devin Moselle MD  [facility] 05/25/2023, 1:55 PM  Recent Labs  Lab 05/22/23 0030 05/23/23 0232 05/23/23 2351 05/24/23 0217 05/25/23 0235  HGB  --    < >  --  8.2* 8.1*  ALBUMIN 3.1*  --   --   --    --   CALCIUM 9.2   < >  --  8.9 8.9  PHOS  --   --  4.6  --   --   CREATININE 8.39*   < >  --  8.61* 11.13*  K 4.1   < >  --  4.3 4.6   < > = values in this interval not displayed.   No results for input(s): "IRON", "TIBC", "FERRITIN" in the last 168 hours. Inpatient medications:  atorvastatin  40 mg Oral Daily   calcitRIOL  2 mcg Oral Q M,W,F   Chlorhexidine Gluconate Cloth  6 each Topical Q0600   Chlorhexidine Gluconate Cloth  6 each Topical Q0600   cinacalcet  60 mg Oral Q breakfast   darbepoetin (ARANESP) injection - DIALYSIS  100 mcg Subcutaneous Q Mon-1800   [START ON 05/26/2023] heparin  2,000 Units Dialysis Once in dialysis   metoprolol tartrate  12.5 mg Oral  BID   mupirocin ointment  1 Application Nasal BID   sevelamer carbonate  3,200 mg Oral TID WC    anticoagulant sodium citrate     acetaminophen **OR** acetaminophen, alteplase, anticoagulant sodium citrate, calcium carbonate (dosed in mg elemental calcium), camphor-menthol **AND** hydrOXYzine, docusate sodium, feeding supplement (NEPRO CARB STEADY), guaiFENesin, guaiFENesin, heparin, [START ON 05/26/2023] heparin, hydrocortisone, lidocaine (PF), lidocaine-prilocaine, metoprolol tartrate, ondansetron **OR** ondansetron (ZOFRAN) IV, mouth rinse, pentafluoroprop-tetrafluoroeth, sorbitol

## 2023-05-25 NOTE — Progress Notes (Addendum)
     Subjective: Patient resting in bed on rounds this morning.  He has not had another urge to urinate.  Objective: Vital signs in last 24 hours: Temp:  [97.7 F (36.5 C)-98.3 F (36.8 C)] 98.3 F (36.8 C) (10/09 1202) Pulse Rate:  [80-90] 85 (10/09 1209) Resp:  [15-26] 26 (10/09 1209) BP: (96-120)/(53-81) 106/67 (10/09 1209) SpO2:  [95 %-99 %] 99 % (10/09 1209) Weight:  [135.9 kg] 135.9 kg (10/09 0530)  Assessment/Plan: #hematuria # Radiation cystitis # Bladder cancer # Prostate cancer   Note from his last cystoscopy on 11/01/2022 notes radiation cystitis, bladder  diverticulum, and no evidence of recurrence.  Urine cytology had no signs of urothelial carcinoma at this time.  Radiation cystitis is very difficult to keep under control by itself. With concurrent bladder cancer, heparin administration, and platelet aggregation issues commonly found in ESRD patients, I would not be surprised if he continues to have some degree of ongoing bleeding.  Hemoglobin improved overnight.  He is anuric so his only urge to urinate was from bleeding localized to the bladder.  Since heparin was discontinued he has not had the urge.  Hemoglobin is stable today so it would appear that his bleeding has leveled off if not stopped.    Patient describes the majority of his bleeding coming after anything invasive, i.e. Foley catheter placement or cystoscopy.  Therefore I would not recommend proceeding with direct visualization unless fulguration is ultimately required.   He will need to follow-up with his surgeon at [facility] to discuss long-term treatment of his radiation cystitis.  Considering that he has active bladder cancer and is anuric, if his hematuria continues to be difficult to control, there may be an indication for cystectomy.  He will continue to have his hemoglobin checked at dialysis 3 times a week so this can be followed closely on an outpatient basis.  Urology will follow along  peripherally.  Please call with questions  Intake/Output from previous day: 10/08 0701 - 10/09 0700 In: 240 [P.O.:240] Out: -   Intake/Output this shift: No intake/output data recorded.  Physical Exam:  General: Alert and oriented CV: No cyanosis Lungs: equal chest rise   Lab Results: Recent Labs    05/23/23 0232 05/24/23 0217 05/25/23 0235  HGB 7.7* 8.2* 8.1*  HCT 24.3* 25.9* 25.1*   BMET Recent Labs    05/24/23 0217 05/25/23 0235  NA 132* 131*  K 4.3 4.6  CL 90* 88*  CO2 29 29  GLUCOSE 95 80  BUN 38* 58*  CREATININE 8.61* 11.13*  CALCIUM 8.9 8.9     Studies/Results: No results found.    LOS: 0 days   Elmon Kirschner, NP [facility] Pager: [phone]  05/25/2023, 12:17 PM

## 2023-05-26 NOTE — Discharge Planning (Signed)
[facility] Patient Discharge Orders- [facility] CLINIC: [place]  Patient's name: Devin Bullock Admit/DC Dates: 05/21/2023 - 05/25/2023  Discharge Diagnoses: SVT - started on metoprolol   Hematuria - per urology pt w/radiation cystitis and likely to continue to have some degree of bleeding  Aranesp: Given: yes   Date and amount of last dose: 100 mcg on 10/7  Last Hgb: 8.1 PRBC's Given: no  ESA dose for discharge: mircera 100 mcg IV q 2 weeks - next 10/14 IV Iron dose at discharge: per protocol  Heparin change: no  EDW Change: no New EDW:   [place] Change: no  Access intervention/Change: no Details:  Hectorol/Calcitriol change: no  Discharge Labs: Calcium 8.9 Phosphorus 4.6 Albumin 3.1 K+ 4.6  IV Antibiotics: no Details:  On Coumadin?: no Last INR: Next INR: Managed By:   OTHER/APPTS/LAB ORDERS:    D/C Meds to be reconciled by nurse after every discharge.  Completed By: Virgina Norfolk, PA-C   Reviewed by: MD:______ RN_______

## 2023-05-26 NOTE — Progress Notes (Signed)
Late Note Entry- May 26, 2023  Pt was d/c yesterday afternoon. Contacted [facility] this morning to advise clinic of pt's d/c date and that pt should resume care tomorrow.   Olivia Canter Renal Navigator [phone]

## 2023-05-28 DIAGNOSIS — R310 Gross hematuria: Principal | ICD-10-CM | POA: Diagnosis present

## 2023-05-28 DIAGNOSIS — R319 Hematuria, unspecified: Secondary | ICD-10-CM | POA: Insufficient documentation

## 2023-05-31 ENCOUNTER — Encounter (HOSPITAL_COMMUNITY): Payer: Self-pay | Admitting: Internal Medicine

## 2023-05-31 ENCOUNTER — Ambulatory Visit: Payer: Medicaid Other | Admitting: Physical Therapy

## 2023-05-31 NOTE — Progress Notes (Signed)
Pre op call eval Name:Devin Bullock  PCP- none, sees Nephrology Cardiologist-O'Neal MD  EKG-05/24/23 Echo-05/22/23 Cath-n/a Stress-2015 ICD/PM-n/a Blood thinner-n/a GLP-1- n/a  Hx: CHF, HTN, prostate and bladder cancer, ESRD. Went to ED 10/5 for tachycardia during  dialysis, ended up in SVT. Was discharged 10/9. While there was seen by cardiology and has a post hospital f/u 10/17. Per patient since d/c has been feeling good, no chest pain or SOB. I asked about mobility, said has ordered walker but is doing okay just needs for longer distances.   Anesthesia Review: Yes

## 2023-06-02 ENCOUNTER — Ambulatory Visit: Payer: Medicaid Other | Admitting: Nurse Practitioner

## 2023-06-02 NOTE — Progress Notes (Deleted)
Office Visit    Patient Name: Devin Bullock Date of Encounter: 06/02/2023  Primary Care Provider:  [name], MD Primary Cardiologist:  Rollene Rotunda, MD  Chief Complaint    57 year old male with a history of chronic heart failure with midrange EF, dilated cardiomyopathy, mild mitral valve regurgitation, mild aortic stenosis, dilation of the ascending aorta, SVT, hypertension, hyperlipidemia, ESRD on HD, bladder cancer, high risk adenocarcinoma of the prostate, anemia, and obesity who presents for hospital follow-up related to SVT.  Past Medical History    Past Medical History:  Diagnosis Date   Anemia    AV fistula (HCC)    Cancer (HCC)    prostate and bladder cancer - 2022 diagnosed   CHF (congestive heart failure) (HCC)    patient denies   Chronic kidney disease    Dialysis patient [facility])    History of blood transfusion 03/2020   Hypertension    history of, no longer taking medication at this time   Morbid obesity (HCC)    Parathyroid disorder [facility])    Past Surgical History:  Procedure Laterality Date   AV FISTULA PLACEMENT Left 05/24/2014   Procedure: BRACHIOCEPHALIC ARTERIOVENOUS (AV) FISTULA CREATION;  Surgeon: Chuck Hint, MD;  Location: [facility] OR;  Service: Vascular;  Laterality: Left;   ESOPHAGOGASTRODUODENOSCOPY (EGD) WITH PROPOFOL N/A 04/03/2020   Procedure: ESOPHAGOGASTRODUODENOSCOPY (EGD) WITH PROPOFOL;  Surgeon: Kerin Salen, MD;  Location: [facility] ENDOSCOPY;  Service: Gastroenterology;  Laterality: N/A;   PROSTATE BIOPSY N/A 11/27/2020   Procedure: BIOPSY TRANSRECTAL ULTRASONIC PROSTATE (TUBP);  Surgeon: Crista Elliot, MD;  Location: [facility] ORS;  Service: Urology;  Laterality: N/A;  REQUESTING 75 MINS FOR CASE   REVISON OF ARTERIOVENOUS FISTULA Left 01/08/2021   Procedure: REVISON PLICATION OF ARTERIOVENOUS FISTULA LEFT;  Surgeon: Maeola Harman, MD;  Location: [facility] OR;  Service: Vascular;  Laterality: Left;   TRANSURETHRAL RESECTION OF BLADDER  TUMOR N/A 11/27/2020   Procedure: TRANSURETHRAL RESECTION OF BLADDER TUMOR (TURBT);  Surgeon: Crista Elliot, MD;  Location: [facility] ORS;  Service: Urology;  Laterality: N/A;  NEED PARALYSIS    Allergies  No Known Allergies   Labs/Other Studies Reviewed    The following studies were reviewed today:  Cardiac Studies & Procedures       ECHOCARDIOGRAM  ECHOCARDIOGRAM COMPLETE 05/22/2023  Narrative ECHOCARDIOGRAM REPORT    Patient Name:   Devin Bullock Date of Exam: 05/22/2023 Medical Rec #:  [id number]   Height:       76.0 in Accession #:    [id number]  Weight:       306.7 lb Date of Birth:  1966-03-26   BSA:          2.656 m Patient Age:    57 years    BP:           110/89 mmHg Patient Gender: M           HR:           97 bpm. Exam Location:  Inpatient  Procedure: 2D Echo, Cardiac Doppler, Color Doppler and Intracardiac Opacification Agent  MODIFIED REPORT: This report was modified by Thomasene Ripple DO on 05/22/2023 due to ascending aorta dimension. Indications:     CHF- Acute Systolic  History:         Patient has prior history of Echocardiogram examinations, most recent 04/13/2013. CHF and Cardiomyopathy; Risk Factors:Hypertension. Chronic Kidney Disease.  Sonographer:     Raeford Razor RDCS Referring Phys:  [phone] [facility]  L GARBA Diagnosing Phys: Lavona Mound Tobb DO   Sonographer Comments: Technically difficult study due to poor echo windows and patient is obese. Image acquisition challenging due to patient body habitus. IMPRESSIONS   1. Left ventricular ejection fraction, by estimation, is 50 to 55%. The left ventricle has low normal function. The left ventricle has no regional wall motion abnormalities. There is severe concentric left ventricular hypertrophy. Left ventricular diastolic parameters are indeterminate. 2. Right ventricular systolic function is normal. The right ventricular size is normal. Tricuspid regurgitation signal is inadequate for assessing PA pressure. 3.  Left atrial size was severely dilated. 4. Moderate pericardial effusion. The pericardial effusion is localized near the right atrium. There is no evidence of cardiac tamponade. 5. The mitral valve is normal in structure. Mild mitral valve regurgitation. No evidence of mitral stenosis. 6. The aortic valve is calcified. Aortic valve regurgitation is not visualized. Mild aortic valve stenosis. Aortic valve area, by VTI measures 2.08 cm. Aortic valve mean gradient measures 17.0 mmHg. Aortic valve Vmax measures 2.52 m/s. 7. There is moderate dilatation of the ascending aorta, measuring 43 mm. 8. The inferior vena cava is normal in size with greater than 50% respiratory variability, suggesting right atrial pressure of 3 mmHg.  FINDINGS Left Ventricle: Left ventricular ejection fraction, by estimation, is 50 to 55%. The left ventricle has low normal function. The left ventricle has no regional wall motion abnormalities. Definity contrast agent was given IV to delineate the left ventricular endocardial borders. The left ventricular internal cavity size was normal in size. There is severe concentric left ventricular hypertrophy. Left ventricular diastolic parameters are indeterminate.  Right Ventricle: The right ventricular size is normal. No increase in right ventricular wall thickness. Right ventricular systolic function is normal. Tricuspid regurgitation signal is inadequate for assessing PA pressure.  Left Atrium: Left atrial size was severely dilated.  Right Atrium: Right atrial size was normal in size.  Pericardium: A moderately sized pericardial effusion is present. The pericardial effusion is localized near the right atrium. There is diastolic collapse of the right atrial wall. There is no evidence of cardiac tamponade.  Mitral Valve: The mitral valve is normal in structure. Mild mitral valve regurgitation. No evidence of mitral valve stenosis.  Tricuspid Valve: The tricuspid valve is normal in  structure. Tricuspid valve regurgitation is not demonstrated. No evidence of tricuspid stenosis.  Aortic Valve: The aortic valve is calcified. Aortic valve regurgitation is not visualized. Mild aortic stenosis is present. Aortic valve mean gradient measures 17.0 mmHg. Aortic valve peak gradient measures 25.4 mmHg. Aortic valve area, by VTI measures 2.08 cm.  Pulmonic Valve: The pulmonic valve was normal in structure. Pulmonic valve regurgitation is not visualized. No evidence of pulmonic stenosis.  Aorta: There is moderate dilatation of the ascending aorta, measuring 43 mm.  Venous: The inferior vena cava is normal in size with greater than 50% respiratory variability, suggesting right atrial pressure of 3 mmHg.  IAS/Shunts: No atrial level shunt detected by color flow Doppler.   LEFT VENTRICLE PLAX 2D LVIDd:         5.80 cm      Diastology LVIDs:         4.50 cm      LV e' medial:    5.77 cm/s LV PW:         1.60 cm      LV E/e' medial:  19.6 LV IVS:        1.50 cm      LV e'  lateral:   7.07 cm/s LVOT diam:     2.50 cm      LV E/e' lateral: 16.0 LV SV:         97 LV SV Index:   37 LVOT Area:     4.91 cm  LV Volumes (MOD) LV vol d, MOD A2C: 123.0 ml LV vol d, MOD A4C: 156.0 ml LV vol s, MOD A2C: 59.9 ml LV vol s, MOD A4C: 77.7 ml LV SV MOD A2C:     63.1 ml LV SV MOD A4C:     156.0 ml LV SV MOD BP:      72.0 ml  RIGHT VENTRICLE             IVC RV Basal diam:  3.70 cm     IVC diam: 1.70 cm RV Mid diam:    2.70 cm RV S prime:     16.40 cm/s TAPSE (M-mode): 1.7 cm  LEFT ATRIUM              Index        RIGHT ATRIUM           Index LA diam:        5.90 cm  2.22 cm/m   RA Area:     21.80 cm LA Vol (A2C):   117.0 ml 44.04 ml/m  RA Volume:   62.60 ml  23.56 ml/m LA Vol (A4C):   143.0 ml 53.83 ml/m LA Biplane Vol: 131.0 ml 49.31 ml/m AORTIC VALVE AV Area (Vmax):    2.44 cm AV Area (Vmean):   2.00 cm AV Area (VTI):     2.08 cm AV Vmax:           252.00 cm/s AV  Vmean:          201.000 cm/s AV VTI:            0.469 m AV Peak Grad:      25.4 mmHg AV Mean Grad:      17.0 mmHg LVOT Vmax:         125.50 cm/s LVOT Vmean:        81.700 cm/s LVOT VTI:          0.198 m LVOT/AV VTI ratio: 0.42  AORTA Ao Root diam: 3.00 cm Ao Asc diam:  4.30 cm  MITRAL VALVE MV Area (PHT): 4.77 cm     SHUNTS MV Decel Time: 159 msec     Systemic VTI:  0.20 m MV E velocity: 113.00 cm/s  Systemic Diam: 2.50 cm  Kardie Tobb DO Electronically signed by Thomasene Ripple DO Signature Date/Time: 05/22/2023/4:16:54 PM    Final (Updated)            Recent Labs: 05/21/2023: B Natriuretic Peptide 147.1; Magnesium 2.0; TSH 0.911 05/22/2023: ALT 16 05/25/2023: BUN 58; Creatinine, Ser 11.13; Hemoglobin 8.1; Platelets 193; Potassium 4.6; Sodium 131  Recent Lipid Panel    Component Value Date/Time   CHOL 143 05/23/2023 0232   TRIG 90 05/23/2023 0232   HDL 50 05/23/2023 0232   CHOLHDL 2.9 05/23/2023 0232   VLDL 18 05/23/2023 0232   LDLCALC 75 05/23/2023 0232    History of Present Illness    57 year old male with the above past medical history including chronic heart failure with midrange EF, dilated cardiomyopathy, mild mitral valve regurgitation, mild aortic stenosis, dilation of the ascending aorta,  SVT, hypertension, hyperlipidemia, ESRD on HD, bladder cancer, high risk adenocarcinoma of the prostate, anemia, and obesity.  He was  previously a evaluated by Dr. Antoine Poche in 2015 in the setting of preoperative cardiac evaluation.  Myoview at the time was showed a small, severe intensity, reversible apical defect consistent with probable apical thinning, significant LVH and LVE, intermediate risk due to decreased LV function (EF 42%).  He has not been seen in follow-up since.  He presented to the ED on 05/21/2023 in the setting of elevated heart rate.  He also reported hematuria and hematochezia.  Telemetry monitoring revealed SVT.  He was hypotensive.  He received 6 mg of adenosine  and converted to sinus tachycardia.  Troponin was elevated.  Cardiology was consulted.  He was started on metoprolol.  It was noted that he may benefit from outpatient repeat evaluation.  Escalation of beta-blocker therapy was limited in the setting of hypotension.  Echocardiogram showed 50 to 55%, low normal LV function, no RWMA, severe concentric LVH, normal RV, moderate pericardial effusion, no evidence of cardiac tamponade, mild mitral valve regurgitation, mild aortic stenosis mean gradient 17 mmHg moderate dilation of the ascending aorta measuring 43 mm.  Cardiac catheterization was deferred.  It was felt that his elevated troponin likely occurred in the setting of demand ischemia versus small vessel disease.  It was noted that coronary CTA could possibly be pursued as an outpatient.  He was started on Lipitor 40 mg daily.  Aspirin was deferred in the setting of acute GI bleed, hematuria.  He was discharged home in stable condition on 05/25/2023.  He presents today for follow-up.  Since his last visit  SVT: Elevated troponin: Chronic heart failure with midrange EF/dilated cardiomyopathy: Aortic stenosis/mitral valve regurgitation: Ascending aortic dilation: Hypertension: Hyperlipidemia: ESRD: Hematuria/GI bleed/anemia: Disposition:  Home Medications    Current Outpatient Medications  Medication Sig Dispense Refill   acetaminophen (TYLENOL) 325 MG tablet Take 2 tablets (650 mg total) by mouth every 6 (six) hours as needed for mild pain (or Fever >/= 101).     atorvastatin (LIPITOR) 40 MG tablet Take 1 tablet (40 mg total) by mouth daily. 30 tablet 2   camphor-menthol (SARNA) lotion Apply 1 Application topically every 8 (eight) hours as needed for itching. 222 mL 0   cinacalcet (SENSIPAR) 60 MG tablet Take 60 mg by mouth daily.     guaiFENesin (MUCINEX) 600 MG 12 hr tablet Take 1 tablet (600 mg total) by mouth 2 (two) times daily as needed for cough or to loosen phlegm. 20 tablet 1    guaifenesin (ROBITUSSIN) 100 MG/5ML syrup Take 200 mg by mouth 3 (three) times daily as needed for cough.     hydrocortisone (ANUSOL-HC) 25 MG suppository Place 1 suppository (25 mg total) rectally at bedtime. 30 suppository 6   hydrOXYzine (ATARAX) 25 MG tablet Take 1 tablet (25 mg total) by mouth every 8 (eight) hours as needed for itching. 30 tablet 0   metoprolol tartrate (LOPRESSOR) 25 MG tablet Take 0.5 tablets (12.5 mg total) by mouth 2 (two) times daily. 60 tablet 2   Na Sulfate-K Sulfate-Mg Sulf (SUPREP BOWEL PREP KIT) 17.5-3.13-1.6 GM/177ML SOLN Take 1 kit by mouth as directed. (Patient not taking: Reported on 05/22/2023) 324 mL 0   sevelamer carbonate (RENVELA) 800 MG tablet Take 2,400-3,200 mg by mouth 2 (two) times daily.     No current facility-administered medications for this visit.     Review of Systems    ***.  All other systems reviewed and are otherwise negative except as noted above.    Physical Exam    VS:  There were no vitals taken for this visit. , BMI There is no height or weight on file to calculate BMI.     GEN: Well nourished, well developed, in no acute distress. HEENT: normal. Neck: Supple, no JVD, carotid bruits, or masses. Cardiac: RRR, no murmurs, rubs, or gallops. No clubbing, cyanosis, edema.  Radials/DP/PT 2+ and equal bilaterally.  Respiratory:  Respirations regular and unlabored, clear to auscultation bilaterally. GI: Soft, nontender, nondistended, BS + x 4. MS: no deformity or atrophy. Skin: warm and dry, no rash. Neuro:  Strength and sensation are intact. Psych: Normal affect.  Accessory Clinical Findings    ECG personally reviewed by me today -    - no acute changes.   Lab Results  Component Value Date   WBC 5.6 05/25/2023   HGB 8.1 (L) 05/25/2023   HCT 25.1 (L) 05/25/2023   MCV 94.7 05/25/2023   PLT 193 05/25/2023   Lab Results  Component Value Date   CREATININE 11.13 (H) 05/25/2023   BUN 58 (H) 05/25/2023   NA 131 (L) 05/25/2023    K 4.6 05/25/2023   CL 88 (L) 05/25/2023   CO2 29 05/25/2023   Lab Results  Component Value Date   ALT 16 05/22/2023   AST 21 05/22/2023   ALKPHOS 78 05/22/2023   BILITOT 0.7 05/22/2023   Lab Results  Component Value Date   CHOL 143 05/23/2023   HDL 50 05/23/2023   LDLCALC 75 05/23/2023   TRIG 90 05/23/2023   CHOLHDL 2.9 05/23/2023    Lab Results  Component Value Date   HGBA1C 5.5 11/01/2013    Assessment & Plan    1.  ***  No BP recorded.  {Refresh Note OR Click here to enter BP  :1}***   Joylene Grapes, NP 06/02/2023, 6:09 AM

## 2023-06-05 ENCOUNTER — Encounter (HOSPITAL_COMMUNITY): Payer: Self-pay | Admitting: Anesthesiology

## 2023-06-06 ENCOUNTER — Telehealth: Payer: Self-pay | Admitting: Internal Medicine

## 2023-06-06 ENCOUNTER — Telehealth: Payer: Self-pay

## 2023-06-06 ENCOUNTER — Other Ambulatory Visit: Payer: Self-pay

## 2023-06-06 NOTE — Telephone Encounter (Signed)
-----   Message from Nurse Kyra Searles sent at 06/06/2023 11:52 AM EDT ----- Regarding: pt canceling colon for 10/22 Hey, I talked with Devin Bullock and he stated he needed to see his cardiologist before he had his colonoscopy done. Therefore, I am canceling his colon for tomorrow. He was having dialysis right now, but asked if someone could call him from the office to reschedule.  Thanks!  Carollee Herter

## 2023-06-06 NOTE — Telephone Encounter (Signed)
Colonoscopy scheduled for 07/12/23 with Dr Leonides Schanz at [facility]. Written instructions ready to mail. Called the patient to advise. No answer. Left message of my call. Requested call back. Patient's cardiology appointment is 07/08/23.

## 2023-06-06 NOTE — Telephone Encounter (Signed)
He is scheduled for 07/12/23 at 7:30 am. I will notify the patient.

## 2023-06-06 NOTE — Telephone Encounter (Signed)
He is on my hospital waiting list. I see that his next cardiology appt is now on 07/08/23. Maybe we could get him rescheduled for a colonoscopy on 11/26 while I am at [facility] for my hospital week since anesthesia wants him to be done at [facility] instead of [facility]. Joni Reining or [place], could you please help arrange for this? Thanks.

## 2023-06-07 ENCOUNTER — Ambulatory Visit (HOSPITAL_COMMUNITY): Admission: RE | Admit: 2023-06-07 | Payer: Medicaid Other | Source: Home / Self Care | Admitting: Internal Medicine

## 2023-06-07 SURGERY — COLONOSCOPY WITH PROPOFOL
Anesthesia: Monitor Anesthesia Care

## 2023-06-14 NOTE — Telephone Encounter (Signed)
Spoke with the patient. He wants to wait for scheduling. He asks to be canceled. I will reach out to him after his cardiology appointment.

## 2023-06-16 ENCOUNTER — Encounter: Payer: Self-pay | Admitting: Physician Assistant

## 2023-06-16 ENCOUNTER — Ambulatory Visit: Payer: Medicaid Other | Admitting: Physician Assistant

## 2023-06-16 VITALS — BP 199/77 | HR 76 | Temp 97.7°F | Ht 76.0 in | Wt 300.3 lb

## 2023-06-16 DIAGNOSIS — N186 End stage renal disease: Secondary | ICD-10-CM | POA: Diagnosis not present

## 2023-06-16 DIAGNOSIS — Z992 Dependence on renal dialysis: Secondary | ICD-10-CM

## 2023-06-16 NOTE — Progress Notes (Signed)
HISTORY AND PHYSICAL     CC:  dialysis access Requesting Provider:  [name], MD  HPI: This is a 57 y.o. male here for evaluation of his hemodialysis access.    Pt has hx of left RC AVF and dialysis center wanted him to be evaluated for aneurysmal areas on his fistula.  He states the fistula works well and it has not bled and it is not painful.  He denies any pain in his hand.  Dialysis access history: -left RC AVF 05/24/2014 Dr. Edilia Bo -revision left arm AVF with plication 01/08/2021 Dr. Randie Heinz   The pt is right hand dominant.    Pt is on dialysis.   Days of dialysis if applicable:  M/W/F    HD center:  [address]  location.   The pt is on a statin for cholesterol management.  The pt is not on a daily aspirin.  Other AC:  none The pt is on BB for hypertension.  The pt is not on medication for diabetes.   Tobacco hx:  never  Past Medical History:  Diagnosis Date   Anemia    AV fistula (HCC)    Cancer (HCC)    prostate and bladder cancer - 2022 diagnosed   CHF (congestive heart failure) (HCC)    patient denies   Chronic kidney disease    Dialysis patient [facility])    History of blood transfusion 03/2020   Hypertension    history of, no longer taking medication at this time   Morbid obesity (HCC)    Parathyroid disorder [facility])     Past Surgical History:  Procedure Laterality Date   AV FISTULA PLACEMENT Left 05/24/2014   Procedure: BRACHIOCEPHALIC ARTERIOVENOUS (AV) FISTULA CREATION;  Surgeon: Chuck Hint, MD;  Location: [facility] OR;  Service: Vascular;  Laterality: Left;   ESOPHAGOGASTRODUODENOSCOPY (EGD) WITH PROPOFOL N/A 04/03/2020   Procedure: ESOPHAGOGASTRODUODENOSCOPY (EGD) WITH PROPOFOL;  Surgeon: Kerin Salen, MD;  Location: [facility] ENDOSCOPY;  Service: Gastroenterology;  Laterality: N/A;   PROSTATE BIOPSY N/A 11/27/2020   Procedure: BIOPSY TRANSRECTAL ULTRASONIC PROSTATE (TUBP);  Surgeon: Crista Elliot, MD;  Location: [facility] ORS;  Service: Urology;   Laterality: N/A;  REQUESTING 29 MINS FOR CASE   REVISON OF ARTERIOVENOUS FISTULA Left 01/08/2021   Procedure: REVISON PLICATION OF ARTERIOVENOUS FISTULA LEFT;  Surgeon: Maeola Harman, MD;  Location: [facility] OR;  Service: Vascular;  Laterality: Left;   TRANSURETHRAL RESECTION OF BLADDER TUMOR N/A 11/27/2020   Procedure: TRANSURETHRAL RESECTION OF BLADDER TUMOR (TURBT);  Surgeon: Crista Elliot, MD;  Location: [facility] ORS;  Service: Urology;  Laterality: N/A;  NEED PARALYSIS    No Known Allergies  Current Outpatient Medications  Medication Sig Dispense Refill   acetaminophen (TYLENOL) 325 MG tablet Take 2 tablets (650 mg total) by mouth every 6 (six) hours as needed for mild pain (or Fever >/= 101).     atorvastatin (LIPITOR) 40 MG tablet Take 1 tablet (40 mg total) by mouth daily. 30 tablet 2   camphor-menthol (SARNA) lotion Apply 1 Application topically every 8 (eight) hours as needed for itching. 222 mL 0   cinacalcet (SENSIPAR) 60 MG tablet Take 60 mg by mouth daily.     guaiFENesin (MUCINEX) 600 MG 12 hr tablet Take 1 tablet (600 mg total) by mouth 2 (two) times daily as needed for cough or to loosen phlegm. 20 tablet 1   guaifenesin (ROBITUSSIN) 100 MG/5ML syrup Take 200 mg by mouth 3 (three) times daily as  needed for cough.     hydrocortisone (ANUSOL-HC) 25 MG suppository Place 1 suppository (25 mg total) rectally at bedtime. 30 suppository 6   hydrOXYzine (ATARAX) 25 MG tablet Take 1 tablet (25 mg total) by mouth every 8 (eight) hours as needed for itching. 30 tablet 0   metoprolol tartrate (LOPRESSOR) 25 MG tablet Take 0.5 tablets (12.5 mg total) by mouth 2 (two) times daily. 60 tablet 2   Na Sulfate-K Sulfate-Mg Sulf (SUPREP BOWEL PREP KIT) 17.5-3.13-1.6 GM/177ML SOLN Take 1 kit by mouth as directed. 324 mL 0   sevelamer carbonate (RENVELA) 800 MG tablet Take 2,400-3,200 mg by mouth 2 (two) times daily.     No current facility-administered medications for this visit.    Family  History  Problem Relation Age of Onset   Diabetes Mother    Hypertension Mother    Heart attack Mother 45   Stroke Father    Hypertension Father    Depression Father    Diabetes Brother    Hypertension Brother    Stroke Brother 51   Alcohol abuse Brother    Asthma Brother    Colon cancer Neg Hx    Esophageal cancer Neg Hx    Stomach cancer Neg Hx     Social History   Socioeconomic History   Marital status: Single    Spouse name: Not on file   Number of children: 2   Years of education: Not on file   Highest education level: Not on file  Occupational History   Occupation: disable  Tobacco Use   Smoking status: Never    Passive exposure: Never   Smokeless tobacco: Never  Vaping Use   Vaping status: Never Used  Substance and Sexual Activity   Alcohol use: No   Drug use: No   Sexual activity: Never  Other Topics Concern   Not on file  Social History Narrative   Lives alone.    Social Determinants of Health   Financial Resource Strain: Not on file  Food Insecurity: Food Insecurity Present (05/21/2023)   Hunger Vital Sign    Worried About Running Out of Food in the Last Year: Often true    Ran Out of Food in the Last Year: Often true  Transportation Needs: No Transportation Needs (05/21/2023)   PRAPARE - Administrator, Civil Service (Medical): No    Lack of Transportation (Non-Medical): No  Physical Activity: Not on file  Stress: Not on file  Social Connections: Not on file  Intimate Partner Violence: Not At Risk (05/21/2023)   Humiliation, Afraid, Rape, and Kick questionnaire    Fear of Current or Ex-Partner: No    Emotionally Abused: No    Physically Abused: No    Sexually Abused: No     ROS: [x]  Positive   [ ]  Negative   [ ]  All sytems reviewed and are negative  GU: [x]  CKD/renal failure  [x]  HD---[x]  M/W/F []  T/T/S  Psychiatric: []  hx of major depression  Integumentary: []  rashes []  ulcers  Constitutional: []  fever []   chills   PHYSICAL EXAMINATION:  Today's Vitals   06/16/23 1455  BP: (!) 199/77  Pulse: 76  Temp: 97.7 F (36.5 C)  TempSrc: Temporal  Weight: (!) 300 lb 4.8 oz (136.2 kg)  Height: 6\' 4"  (1.93 m)   Body mass index is 36.55 kg/m.    General:  WDWN male in NAD Gait: Not observed HENT: WNL Pulmonary: normal non-labored breathing  Cardiac: regular, without carotid bruits  Abdomen: soft, NT Vascular Exam/Pulses:   Right Left  Radial 2+ (normal) 2+ (normal)   Extremities:  palpable radial pulses bilaterally; +thrill within fistula.  2 aneurysmal areas over fistula.  There is no scabs.  I can pinch the skin over the fistula except the distal portion of the distal aneurysm.   Musculoskeletal: no muscle wasting or atrophy  Neurologic: A&O X 3   ASSESSMENT/PLAN: 57 y.o. male with ESRD here for evaluation of his hemodialysis access with hx of left RC AVF in 2015 and plication in 2022   -pt does not have evidence of steal.  He does not have any scab or eschar over the fistula and the skin is able to be pinched up over the fistula except distally.  He has not had any bleeding from the fistula and it is working well. Discussed with Dr. Karin Lieu and given the above, would not currently recommend any surgical intervention.   -discussed with pt the HD center should not stick directly over the distal aneurysmal area.   -he knows that if he develops a scab or bleeding issues to call us to be seen. -discussed with pt that access does not last forever and will need intervention or even new access at some point.  -pt is right hand dominant    Doreatha Massed, [facility] [phone]  Clinic MD:   Karin Lieu

## 2023-06-28 ENCOUNTER — Other Ambulatory Visit (HOSPITAL_COMMUNITY): Payer: Self-pay

## 2023-07-08 ENCOUNTER — Encounter: Payer: Self-pay | Admitting: Nurse Practitioner

## 2023-07-08 ENCOUNTER — Ambulatory Visit: Payer: Medicaid Other | Attending: Nurse Practitioner | Admitting: Nurse Practitioner

## 2023-07-08 VITALS — BP 108/72 | HR 70 | Ht 76.0 in | Wt 309.2 lb

## 2023-07-08 DIAGNOSIS — I953 Hypotension of hemodialysis: Secondary | ICD-10-CM

## 2023-07-08 DIAGNOSIS — I471 Supraventricular tachycardia, unspecified: Secondary | ICD-10-CM

## 2023-07-08 DIAGNOSIS — N186 End stage renal disease: Secondary | ICD-10-CM

## 2023-07-08 DIAGNOSIS — R7989 Other specified abnormal findings of blood chemistry: Secondary | ICD-10-CM | POA: Diagnosis not present

## 2023-07-08 DIAGNOSIS — D649 Anemia, unspecified: Secondary | ICD-10-CM

## 2023-07-08 DIAGNOSIS — Z992 Dependence on renal dialysis: Secondary | ICD-10-CM

## 2023-07-08 DIAGNOSIS — I5022 Chronic systolic (congestive) heart failure: Secondary | ICD-10-CM

## 2023-07-08 MED ORDER — METOPROLOL TARTRATE 25 MG PO TABS: 12.5000 mg | ORAL_TABLET | Freq: Two times a day (BID) | ORAL | 11 refills | Status: DC

## 2023-07-08 NOTE — Progress Notes (Unsigned)
Office Visit    Patient Name: Devin Bullock Date of Encounter: 07/08/2023  Primary Care Provider:  [name], MD Primary Cardiologist:  Rollene Rotunda, MD  Chief Complaint    57 year old male with a history of chronic systolic heart failure, dilated cardiomyopathy, SVT, AVNRT, GI bleed, anemia, prostate cancer, bladder cancer, ESRD on HD, and obesity who presents for hospital follow-up related to SVT.  Past Medical History    Past Medical History:  Diagnosis Date   Anemia    AV fistula (HCC)    Cancer (HCC)    prostate and bladder cancer - 2022 diagnosed   CHF (congestive heart failure) (HCC)    patient denies   Chronic kidney disease    Dialysis patient [facility])    History of blood transfusion 03/2020   Hypertension    history of, no longer taking medication at this time   Morbid obesity (HCC)    Parathyroid disorder [facility])    Past Surgical History:  Procedure Laterality Date   AV FISTULA PLACEMENT Left 05/24/2014   Procedure: BRACHIOCEPHALIC ARTERIOVENOUS (AV) FISTULA CREATION;  Surgeon: Chuck Hint, MD;  Location: [facility] OR;  Service: Vascular;  Laterality: Left;   ESOPHAGOGASTRODUODENOSCOPY (EGD) WITH PROPOFOL N/A 04/03/2020   Procedure: ESOPHAGOGASTRODUODENOSCOPY (EGD) WITH PROPOFOL;  Surgeon: Kerin Salen, MD;  Location: [facility] ENDOSCOPY;  Service: Gastroenterology;  Laterality: N/A;   PROSTATE BIOPSY N/A 11/27/2020   Procedure: BIOPSY TRANSRECTAL ULTRASONIC PROSTATE (TUBP);  Surgeon: Crista Elliot, MD;  Location: [facility]S;  Service: Urology;  Laterality: N/A;  REQUESTING 26 MINS FOR CASE   REVISON OF ARTERIOVENOUS FISTULA Left 01/08/2021   Procedure: REVISON PLICATION OF ARTERIOVENOUS FISTULA LEFT;  Surgeon: Maeola Harman, MD;  Location: [facility] OR;  Service: Vascular;  Laterality: Left;   TRANSURETHRAL RESECTION OF BLADDER TUMOR N/A 11/27/2020   Procedure: TRANSURETHRAL RESECTION OF BLADDER TUMOR (TURBT);  Surgeon: Crista Elliot, MD;  Location: [facility] ORS;   Service: Urology;  Laterality: N/A;  NEED PARALYSIS    Allergies  No Known Allergies   Labs/Other Studies Reviewed    The following studies were reviewed today:  Cardiac Studies & Procedures       ECHOCARDIOGRAM  ECHOCARDIOGRAM COMPLETE 05/22/2023  Narrative ECHOCARDIOGRAM REPORT    Patient Name:   Devin Bullock Date of Exam: 05/22/2023 Medical Rec #:  [id number]   Height:       76.0 in Accession #:    [id number]  Weight:       306.7 lb Date of Birth:  04/03/1966   BSA:          2.656 m Patient Age:    57 years    BP:           110/89 mmHg Patient Gender: M           HR:           97 bpm. Exam Location:  Inpatient  Procedure: 2D Echo, Cardiac Doppler, Color Doppler and Intracardiac Opacification Agent  MODIFIED REPORT: This report was modified by Thomasene Ripple DO on 05/22/2023 due to ascending aorta dimension. Indications:     CHF- Acute Systolic  History:         Patient has prior history of Echocardiogram examinations, most recent 04/13/2013. CHF and Cardiomyopathy; Risk Factors:Hypertension. Chronic Kidney Disease.  Sonographer:     Raeford Razor RDCS Referring Phys:  [id number] Cheri Rous Mikeal Hawthorne Diagnosing Phys: Thomasene Ripple DO   Sonographer Comments: Technically difficult study due to poor  echo windows and patient is obese. Image acquisition challenging due to patient body habitus. IMPRESSIONS   1. Left ventricular ejection fraction, by estimation, is 50 to 55%. The left ventricle has low normal function. The left ventricle has no regional wall motion abnormalities. There is severe concentric left ventricular hypertrophy. Left ventricular diastolic parameters are indeterminate. 2. Right ventricular systolic function is normal. The right ventricular size is normal. Tricuspid regurgitation signal is inadequate for assessing PA pressure. 3. Left atrial size was severely dilated. 4. Moderate pericardial effusion. The pericardial effusion is localized near the right atrium. There  is no evidence of cardiac tamponade. 5. The mitral valve is normal in structure. Mild mitral valve regurgitation. No evidence of mitral stenosis. 6. The aortic valve is calcified. Aortic valve regurgitation is not visualized. Mild aortic valve stenosis. Aortic valve area, by VTI measures 2.08 cm. Aortic valve mean gradient measures 17.0 mmHg. Aortic valve Vmax measures 2.52 m/s. 7. There is moderate dilatation of the ascending aorta, measuring 43 mm. 8. The inferior vena cava is normal in size with greater than 50% respiratory variability, suggesting right atrial pressure of 3 mmHg.  FINDINGS Left Ventricle: Left ventricular ejection fraction, by estimation, is 50 to 55%. The left ventricle has low normal function. The left ventricle has no regional wall motion abnormalities. Definity contrast agent was given IV to delineate the left ventricular endocardial borders. The left ventricular internal cavity size was normal in size. There is severe concentric left ventricular hypertrophy. Left ventricular diastolic parameters are indeterminate.  Right Ventricle: The right ventricular size is normal. No increase in right ventricular wall thickness. Right ventricular systolic function is normal. Tricuspid regurgitation signal is inadequate for assessing PA pressure.  Left Atrium: Left atrial size was severely dilated.  Right Atrium: Right atrial size was normal in size.  Pericardium: A moderately sized pericardial effusion is present. The pericardial effusion is localized near the right atrium. There is diastolic collapse of the right atrial wall. There is no evidence of cardiac tamponade.  Mitral Valve: The mitral valve is normal in structure. Mild mitral valve regurgitation. No evidence of mitral valve stenosis.  Tricuspid Valve: The tricuspid valve is normal in structure. Tricuspid valve regurgitation is not demonstrated. No evidence of tricuspid stenosis.  Aortic Valve: The aortic valve is  calcified. Aortic valve regurgitation is not visualized. Mild aortic stenosis is present. Aortic valve mean gradient measures 17.0 mmHg. Aortic valve peak gradient measures 25.4 mmHg. Aortic valve area, by VTI measures 2.08 cm.  Pulmonic Valve: The pulmonic valve was normal in structure. Pulmonic valve regurgitation is not visualized. No evidence of pulmonic stenosis.  Aorta: There is moderate dilatation of the ascending aorta, measuring 43 mm.  Venous: The inferior vena cava is normal in size with greater than 50% respiratory variability, suggesting right atrial pressure of 3 mmHg.  IAS/Shunts: No atrial level shunt detected by color flow Doppler.   LEFT VENTRICLE PLAX 2D LVIDd:         5.80 cm      Diastology LVIDs:         4.50 cm      LV e' medial:    5.77 cm/s LV PW:         1.60 cm      LV E/e' medial:  19.6 LV IVS:        1.50 cm      LV e' lateral:   7.07 cm/s LVOT diam:     2.50 cm  LV E/e' lateral: 16.0 LV SV:         97 LV SV Index:   37 LVOT Area:     4.91 cm  LV Volumes (MOD) LV vol d, MOD A2C: 123.0 ml LV vol d, MOD A4C: 156.0 ml LV vol s, MOD A2C: 59.9 ml LV vol s, MOD A4C: 77.7 ml LV SV MOD A2C:     63.1 ml LV SV MOD A4C:     156.0 ml LV SV MOD BP:      72.0 ml  RIGHT VENTRICLE             IVC RV Basal diam:  3.70 cm     IVC diam: 1.70 cm RV Mid diam:    2.70 cm RV S prime:     16.40 cm/s TAPSE (M-mode): 1.7 cm  LEFT ATRIUM              Index        RIGHT ATRIUM           Index LA diam:        5.90 cm  2.22 cm/m   RA Area:     21.80 cm LA Vol (A2C):   117.0 ml 44.04 ml/m  RA Volume:   62.60 ml  23.56 ml/m LA Vol (A4C):   143.0 ml 53.83 ml/m LA Biplane Vol: 131.0 ml 49.31 ml/m AORTIC VALVE AV Area (Vmax):    2.44 cm AV Area (Vmean):   2.00 cm AV Area (VTI):     2.08 cm AV Vmax:           252.00 cm/s AV Vmean:          201.000 cm/s AV VTI:            0.469 m AV Peak Grad:      25.4 mmHg AV Mean Grad:      17.0 mmHg LVOT Vmax:          125.50 cm/s LVOT Vmean:        81.700 cm/s LVOT VTI:          0.198 m LVOT/AV VTI ratio: 0.42  AORTA Ao Root diam: 3.00 cm Ao Asc diam:  4.30 cm  MITRAL VALVE MV Area (PHT): 4.77 cm     SHUNTS MV Decel Time: 159 msec     Systemic VTI:  0.20 m MV E velocity: 113.00 cm/s  Systemic Diam: 2.50 cm  Kardie Tobb DO Electronically signed by Thomasene Ripple DO Signature Date/Time: 05/22/2023/4:16:54 PM    Final (Updated)            Recent Labs: 05/21/2023: B Natriuretic Peptide 147.1; Magnesium 2.0; TSH 0.911 05/22/2023: ALT 16 05/25/2023: BUN 58; Creatinine, Ser 11.13; Hemoglobin 8.1; Platelets 193; Potassium 4.6; Sodium 131  Recent Lipid Panel    Component Value Date/Time   CHOL 143 05/23/2023 0232   TRIG 90 05/23/2023 0232   HDL 50 05/23/2023 0232   CHOLHDL 2.9 05/23/2023 0232   VLDL 18 05/23/2023 0232   LDLCALC 75 05/23/2023 0232    History of Present Illness    57 year old male with the above past medical history including chronic systolic heart failure, dilated cardiomyopathy, SVT, AVNRT, GI bleed, anemia, prostate cancer, bladder cancer, ESRD on HD, and obesity.  He has not been seen in the office since 2015.  Lexiscan Myoview at the time was intermediate risk with reversible apical defect consistent with probable apical thinning.  He was hospitalized in October 2024 in the setting  of SVT.  He had an elevated heart rate during dialysis with associated shortness of breath.  He was given adenosine and IV metoprolol with improvement in his heart rate. Cardiology was consulted.  He was started on metoprolol tartrate 12.5 mg twice daily.  Echocardiogram showed normal EF (prior EF 45-50%). TSH was normal.  Troponin was elevated, likely secondary to demand ischemia in the setting of tachycardia. He was not felt to be a candidate for invasive angiography. It was noted that outpatient coronary CTA could be considered. It was noted that should he have recurrent arrhythmia, EP evaluation could  be considered as an outpatient. Additionally, it was noted that should he have difficulty with hypotension on metoprolol, amiodarone could be considered. CT of the chest on admission showed goiter with multiple thyroid nodules. Outpatient thyroid ultrasound was recommended. He has had recent issues with hematuria, GI bleeding, and anemia. He is undergoing evaluation by GI and urology.  In this setting, he was not felt to be a candidate for aspirin.  He presents today for follow-up.  Since his hospitalization he has done well from a cardiac standpoint.  He denies any chest pain, palpitations, dyspnea, edema, PND, orthopnea, weight gain.  Overall, he reports feeling well.  Home Medications    Current Outpatient Medications  Medication Sig Dispense Refill   acetaminophen (TYLENOL) 325 MG tablet Take 2 tablets (650 mg total) by mouth every 6 (six) hours as needed for mild pain (or Fever >/= 101).     atorvastatin (LIPITOR) 40 MG tablet Take 1 tablet (40 mg total) by mouth daily. 30 tablet 2   camphor-menthol (SARNA) lotion Apply 1 Application topically every 8 (eight) hours as needed for itching. 222 mL 0   cinacalcet (SENSIPAR) 60 MG tablet Take 60 mg by mouth daily.     guaiFENesin (MUCINEX) 600 MG 12 hr tablet Take 1 tablet (600 mg total) by mouth 2 (two) times daily as needed for cough or to loosen phlegm. 20 tablet 1   guaifenesin (ROBITUSSIN) 100 MG/5ML syrup Take 200 mg by mouth 3 (three) times daily as needed for cough.     hydrocortisone (ANUSOL-HC) 25 MG suppository Place 1 suppository (25 mg total) rectally at bedtime. 30 suppository 6   hydrOXYzine (ATARAX) 25 MG tablet Take 1 tablet (25 mg total) by mouth every 8 (eight) hours as needed for itching. 30 tablet 0   Na Sulfate-K Sulfate-Mg Sulf (SUPREP BOWEL PREP KIT) 17.5-3.13-1.6 GM/177ML SOLN Take 1 kit by mouth as directed. 324 mL 0   sevelamer carbonate (RENVELA) 800 MG tablet Take 2,400-3,200 mg by mouth 2 (two) times daily.      metoprolol tartrate (LOPRESSOR) 25 MG tablet Take 0.5 tablets (12.5 mg total) by mouth 2 (two) times daily. 60 tablet 11   No current facility-administered medications for this visit.     Review of Systems    He denies chest pain, palpitations, dyspnea, pnd, orthopnea, n, v, dizziness, syncope, edema, weight gain, or early satiety. All other systems reviewed and are otherwise negative except as noted above.   Physical Exam    VS:  BP 108/72 (BP Location: Left Arm, Patient Position: Sitting, Cuff Size: Large)   Pulse 70   Ht 6\' 4"  (1.93 m)   Wt (!) 309 lb 3.2 oz (140.3 kg)   SpO2 99%   BMI 37.64 kg/m   GEN: Well nourished, well developed, in no acute distress. HEENT: normal. Neck: Supple, no JVD, carotid bruits, or masses. Cardiac: RRR, no murmurs,  rubs, or gallops. No clubbing, cyanosis, edema.  Radials/DP/PT 2+ and equal bilaterally.  Respiratory:  Respirations regular and unlabored, clear to auscultation bilaterally. GI: Soft, nontender, nondistended, BS + x 4. MS: no deformity or atrophy. Skin: warm and dry, no rash. Neuro:  Strength and sensation are intact. Psych: Normal affect.  Accessory Clinical Findings    ECG personally reviewed by me today - EKG Interpretation Date/Time:  Friday July 08 2023 15:35:55 EST Ventricular Rate:  70 PR Interval:  172 QRS Duration:  116 QT Interval:  426 QTC Calculation: 460 R Axis:   -38  Text Interpretation: Sinus rhythm with Premature atrial complexes Left axis deviation Left ventricular hypertrophy with QRS widening ( R in aVL , Cornell product ) T wave abnormality, consider lateral ischemia Prolonged QT Confirmed by Bernadene Person ([id number]) on 07/08/2023 3:51:21 PM  - no acute changes.   Lab Results  Component Value Date   WBC 5.6 05/25/2023   HGB 8.1 (L) 05/25/2023   HCT 25.1 (L) 05/25/2023   MCV 94.7 05/25/2023   PLT 193 05/25/2023   Lab Results  Component Value Date   CREATININE 11.13 (H) 05/25/2023   BUN 58 (H)  05/25/2023   NA 131 (L) 05/25/2023   K 4.6 05/25/2023   CL 88 (L) 05/25/2023   CO2 29 05/25/2023   Lab Results  Component Value Date   ALT 16 05/22/2023   AST 21 05/22/2023   ALKPHOS 78 05/22/2023   BILITOT 0.7 05/22/2023   Lab Results  Component Value Date   CHOL 143 05/23/2023   HDL 50 05/23/2023   LDLCALC 75 05/23/2023   TRIG 90 05/23/2023   CHOLHDL 2.9 05/23/2023    Lab Results  Component Value Date   HGBA1C 5.5 11/01/2013    Assessment & Plan    1. SVT/AVNRT: Occurred during dialysis, improved with adenosine and metoprolol.  Echo, labs were unremarkable.  He is tolerating low-dose metoprolol, denies any palpitations.  Continue metoprolol.  2. Hypotension: BP remains stable on low-dose metoprolol.  Continue to monitor.  3. Chronic systolic heart failure with improved EF: Prior EF 45 to 50%, most recent echo showed normal EF. Euvolemic and well compensated on exam.  Fluid volume management per HD.  4. Elevated troponin: Likely occurred in the setting of demand ischemia in the setting of tachycardia.  Outpatient ischemic evaluation was mentioned.  Patient is asymptomatic.  Discussed with Dr. Antoine Poche, no indication for ischemic evaluation at this time.  Should he develop symptoms, could consider cardiac PET stress test.  5. Anemia/GI bleed/hematuria: Following with GI/urology.  Pending colonoscopy as below.  6. ESRD on HD: Following with nephrology.  7. Preoperative cardiac exam: According to the Revised Cardiac Risk Index (RCRI), his Perioperative Risk of Major Cardiac Event is (%): 0.9. His Functional Capacity in METs is: 4.4 according to the Duke Activity Status Index (DASI).Therefore, based on ACC/AHA guidelines, patient would be at acceptable risk for the planned procedure without further cardiovascular testing.  Reviewed with Dr. Antoine Poche, who agrees further testing not indicated prior to colonoscopy.   8. Disposition: Follow-up in 3 months, sooner if  needed.  Joylene Grapes, NP 07/10/2023, 11:29 AM

## 2023-07-08 NOTE — Patient Instructions (Signed)
Medication Instructions:  Your physician recommends that you continue on your current medications as directed. Please refer to the Current Medication list given to you today.  *If you need a refill on your cardiac medications before your next appointment, please call your pharmacy*   Lab Work: NONE ordered at this time of appointment     Testing/Procedures: NONE ordered at this time of appointment     Follow-Up: At [facility], you and your health needs are our priority.  As part of our continuing mission to provide you with exceptional heart care, we have created designated Provider Care Teams.  These Care Teams include your primary Cardiologist (physician) and Advanced Practice Providers (APPs -  Physician Assistants and Nurse Practitioners) who all work together to provide you with the care you need, when you need it.  We recommend signing up for the patient portal called "MyChart".  Sign up information is provided on this After Visit Summary.  MyChart is used to connect with patients for Virtual Visits (Telemedicine).  Patients are able to view lab/test results, encounter notes, upcoming appointments, etc.  Non-urgent messages can be sent to your provider as well.   To learn more about what you can do with MyChart, go to [email or web address].    Your next appointment:   3 month(s)  Provider:   Bernadene Person, NP        Other Instructions

## 2023-07-10 ENCOUNTER — Encounter: Payer: Self-pay | Admitting: Nurse Practitioner

## 2023-07-12 ENCOUNTER — Ambulatory Visit (HOSPITAL_COMMUNITY): Admit: 2023-07-12 | Payer: Medicaid Other | Admitting: Internal Medicine

## 2023-07-12 ENCOUNTER — Encounter (HOSPITAL_COMMUNITY): Payer: Self-pay

## 2023-07-12 SURGERY — COLONOSCOPY WITH PROPOFOL
Anesthesia: Monitor Anesthesia Care

## 2023-07-26 ENCOUNTER — Encounter (HOSPITAL_COMMUNITY): Payer: Self-pay | Admitting: *Deleted

## 2023-08-01 DIAGNOSIS — R238 Other skin changes: Secondary | ICD-10-CM | POA: Insufficient documentation

## 2023-08-02 ENCOUNTER — Ambulatory Visit: Payer: Medicaid Other | Admitting: Podiatry

## 2023-08-02 ENCOUNTER — Encounter: Payer: Self-pay | Admitting: Podiatry

## 2023-08-02 DIAGNOSIS — G5762 Lesion of plantar nerve, left lower limb: Secondary | ICD-10-CM

## 2023-08-02 DIAGNOSIS — G5782 Other specified mononeuropathies of left lower limb: Secondary | ICD-10-CM

## 2023-08-02 DIAGNOSIS — B351 Tinea unguium: Secondary | ICD-10-CM | POA: Diagnosis not present

## 2023-08-02 DIAGNOSIS — M79609 Pain in unspecified limb: Secondary | ICD-10-CM | POA: Diagnosis not present

## 2023-08-02 DIAGNOSIS — B353 Tinea pedis: Secondary | ICD-10-CM | POA: Diagnosis not present

## 2023-08-02 MED ORDER — TERBINAFINE HCL 250 MG PO TABS: 250.0000 mg | ORAL_TABLET | Freq: Every day | ORAL | 0 refills | Status: DC

## 2023-08-02 MED ORDER — TRIAMCINOLONE ACETONIDE 40 MG/ML IJ SUSP
20.0000 mg | Freq: Once | INTRAMUSCULAR | Status: AC
Start: 2023-08-02 — End: 2023-08-02
  Administered 2023-08-02: 20 mg

## 2023-08-02 NOTE — Progress Notes (Signed)
Subjective:  Patient ID: Devin Bullock, male    DOB: 1966-01-18,  MRN: [id number] HPI Chief Complaint  Patient presents with   Debridement    Requesting nail trim - concerned about fungus, neuropathy issues-not on any meds for it, still having problems with tinea-treated last time in 2019   New Patient (Initial Visit)    Est pt 2019    57 y.o. male presents with the above complaint.   ROS: Denies fever chills nausea vomit muscle aches pains calf pain back pain chest pain shortness of breath.  Past Medical History:  Diagnosis Date   Anemia    AV fistula (HCC)    Cancer (HCC)    prostate and bladder cancer - 2022 diagnosed   CHF (congestive heart failure) (HCC)    patient denies   Chronic kidney disease    Dialysis patient [facility])    History of blood transfusion 03/2020   Hypertension    history of, no longer taking medication at this time   Morbid obesity (HCC)    Parathyroid disorder [facility])    Past Surgical History:  Procedure Laterality Date   AV FISTULA PLACEMENT Left 05/24/2014   Procedure: BRACHIOCEPHALIC ARTERIOVENOUS (AV) FISTULA CREATION;  Surgeon: Chuck Hint, MD;  Location: [facility] OR;  Service: Vascular;  Laterality: Left;   ESOPHAGOGASTRODUODENOSCOPY (EGD) WITH PROPOFOL N/A 04/03/2020   Procedure: ESOPHAGOGASTRODUODENOSCOPY (EGD) WITH PROPOFOL;  Surgeon: Kerin Salen, MD;  Location: [facility] ENDOSCOPY;  Service: Gastroenterology;  Laterality: N/A;   PROSTATE BIOPSY N/A 11/27/2020   Procedure: BIOPSY TRANSRECTAL ULTRASONIC PROSTATE (TUBP);  Surgeon: Crista Elliot, MD;  Location: [facility] ORS;  Service: Urology;  Laterality: N/A;  REQUESTING 53 MINS FOR CASE   REVISON OF ARTERIOVENOUS FISTULA Left 01/08/2021   Procedure: REVISON PLICATION OF ARTERIOVENOUS FISTULA LEFT;  Surgeon: Maeola Harman, MD;  Location: [facility] OR;  Service: Vascular;  Laterality: Left;   TRANSURETHRAL RESECTION OF BLADDER TUMOR N/A 11/27/2020   Procedure: TRANSURETHRAL RESECTION OF BLADDER TUMOR  (TURBT);  Surgeon: Crista Elliot, MD;  Location: [facility] ORS;  Service: Urology;  Laterality: N/A;  NEED PARALYSIS    Current Outpatient Medications:    terbinafine (LAMISIL) 250 MG tablet, Take 1 tablet (250 mg total) by mouth daily., Disp: 30 tablet, Rfl: 0   acetaminophen (TYLENOL) 325 MG tablet, Take 2 tablets (650 mg total) by mouth every 6 (six) hours as needed for mild pain (or Fever >/= 101)., Disp: , Rfl:    atorvastatin (LIPITOR) 40 MG tablet, Take 1 tablet (40 mg total) by mouth daily., Disp: 30 tablet, Rfl: 2   camphor-menthol (SARNA) lotion, Apply 1 Application topically every 8 (eight) hours as needed for itching., Disp: 222 mL, Rfl: 0   cinacalcet (SENSIPAR) 60 MG tablet, Take 60 mg by mouth daily., Disp: , Rfl:    guaiFENesin (MUCINEX) 600 MG 12 hr tablet, Take 1 tablet (600 mg total) by mouth 2 (two) times daily as needed for cough or to loosen phlegm., Disp: 20 tablet, Rfl: 1   guaifenesin (ROBITUSSIN) 100 MG/5ML syrup, Take 200 mg by mouth 3 (three) times daily as needed for cough., Disp: , Rfl:    hydrocortisone (ANUSOL-HC) 25 MG suppository, Place 1 suppository (25 mg total) rectally at bedtime., Disp: 30 suppository, Rfl: 6   hydrOXYzine (ATARAX) 25 MG tablet, Take 1 tablet (25 mg total) by mouth every 8 (eight) hours as needed for itching., Disp: 30 tablet, Rfl: 0   metoprolol tartrate (LOPRESSOR) 25 MG tablet, Take 0.5  tablets (12.5 mg total) by mouth 2 (two) times daily., Disp: 60 tablet, Rfl: 11   Na Sulfate-K Sulfate-Mg Sulf (SUPREP BOWEL PREP KIT) 17.5-3.13-1.6 GM/177ML SOLN, Take 1 kit by mouth as directed., Disp: 324 mL, Rfl: 0   sevelamer carbonate (RENVELA) 800 MG tablet, Take 2,400-3,200 mg by mouth 2 (two) times daily., Disp: , Rfl:   No Known Allergies Review of Systems Objective:  There were no vitals filed for this visit.  General: Well developed, nourished, in no acute distress, alert and oriented x3   Dermatological: Skin is warm, dry and supple  bilateral. Nails x 10 are thick yellow dystrophic clinically mycotic.  Remaining integument appears unremarkable at this time. There are no open sores, no preulcerative lesions, tinea pedis bilateral   Vascular: Dorsalis Pedis artery and Posterior Tibial artery pedal pulses are 2/4 bilateral with immedate capillary fill time. Pedal hair growth present. No varicosities and no lower extremity edema present bilateral.   Neruologic: Grossly intact via light touch bilateral. Vibratory intact via tuning fork bilateral. Protective threshold with Semmes Wienstein monofilament intact to all pedal sites bilateral. Patellar and Achilles deep tendon reflexes 2+ bilateral. No Babinski or clonus noted bilateral.   Musculoskeletal: No gross boney pedal deformities bilateral. No pain, crepitus, or limitation noted with foot and ankle range of motion bilateral. Muscular strength 5/5 in all groups tested bilateral.  Pain on palpation third interspace left foot  Gait: Unassisted, Nonantalgic.    Radiographs:  None taken  Assessment & Plan:   Assessment: Pain limb secondary to onychomycosis and tinea pedis.  Neuroma third interspace left foot  Plan: Debrided nails 1 through 5 bilateral and multiple benign skin lesions.  Started him on Lamisil 250 mg tablets 1 p.o. daily.  And I also injected the third interdigital space with Kenalog and local anesthetic left foot.     Kavon Valenza T. [place], [place]

## 2023-08-08 NOTE — Progress Notes (Deleted)
Office Note     CC:  *** Requesting Provider:  [name], *  HPI: Devin Bullock is a 57 y.o. (1965-12-15) male presenting at the request of .[name], MD ***  The pt is *** on a statin for cholesterol management.  The pt is *** on a daily aspirin.   Other AC:  *** The pt is *** on medication for hypertension.   The pt is *** diabetic.  Tobacco hx:  ***  Past Medical History:  Diagnosis Date   Anemia    AV fistula (HCC)    Cancer (HCC)    prostate and bladder cancer - 2022 diagnosed   CHF (congestive heart failure) (HCC)    patient denies   Chronic kidney disease    Dialysis patient [facility])    History of blood transfusion 03/2020   Hypertension    history of, no longer taking medication at this time   Morbid obesity (HCC)    Parathyroid disorder [facility])     Past Surgical History:  Procedure Laterality Date   AV FISTULA PLACEMENT Left 05/24/2014   Procedure: BRACHIOCEPHALIC ARTERIOVENOUS (AV) FISTULA CREATION;  Surgeon: Chuck Hint, MD;  Location: [facility] OR;  Service: Vascular;  Laterality: Left;   ESOPHAGOGASTRODUODENOSCOPY (EGD) WITH PROPOFOL N/A 04/03/2020   Procedure: ESOPHAGOGASTRODUODENOSCOPY (EGD) WITH PROPOFOL;  Surgeon: Kerin Salen, MD;  Location: [facility] ENDOSCOPY;  Service: Gastroenterology;  Laterality: N/A;   PROSTATE BIOPSY N/A 11/27/2020   Procedure: BIOPSY TRANSRECTAL ULTRASONIC PROSTATE (TUBP);  Surgeon: Crista Elliot, MD;  [facility] ORS;  Service: Urology;  Laterality: N/A;  REQUESTING 64 MINS FOR CASE   REVISON OF ARTERIOVENOUS FISTULA Left 01/08/2021   Procedure: REVISON PLICATION OF ARTERIOVENOUS FISTULA LEFT;  Surgeon: Maeola Harman, MD;  Location: [facility] OR;  Service: Vascular;  Laterality: Left;   TRANSURETHRAL RESECTION OF BLADDER TUMOR N/A 11/27/2020   Procedure: TRANSURETHRAL RESECTION OF BLADDER TUMOR (TURBT);  Surgeon: Crista Elliot, MD;  Location: [facility] ORS;  Service: Urology;  Laterality: N/A;  NEED PARALYSIS     Social History   Socioeconomic History   Marital status: Single    Spouse name: Not on file   Number of children: 2   Years of education: Not on file   Highest education level: Not on file  Occupational History   Occupation: disable  Tobacco Use   Smoking status: Never    Passive exposure: Never   Smokeless tobacco: Never  Vaping Use   Vaping status: Never Used  Substance and Sexual Activity   Alcohol use: No   Drug use: No   Sexual activity: Never  Other Topics Concern   Not on file  Social History Narrative   Lives alone.    Social Drivers of Corporate investment banker Strain: Not on file  Food Insecurity: Food Insecurity Present (05/21/2023)   Hunger Vital Sign    Worried About Running Out of Food in the Last Year: Often true    Ran Out of Food in the Last Year: Often true  Transportation Needs: No Transportation Needs (05/21/2023)   PRAPARE - Administrator, Civil Service (Medical): No    Lack of Transportation (Non-Medical): No  Physical Activity: Not on file  Stress: Not on file  Social Connections: Not on file  Intimate Partner Violence: Not At Risk (05/21/2023)   Humiliation, Afraid, Rape, and Kick questionnaire    Fear of Current or Ex-Partner: No    Emotionally Abused: No  Physically Abused: No    Sexually Abused: No   *** Family History  Problem Relation Age of Onset   Diabetes Mother    Hypertension Mother    Heart attack Mother 74   Stroke Father    Hypertension Father    Depression Father    Diabetes Brother    Hypertension Brother    Stroke Brother 40   Alcohol abuse Brother    Asthma Brother    Colon cancer Neg Hx    Esophageal cancer Neg Hx    Stomach cancer Neg Hx     Current Outpatient Medications  Medication Sig Dispense Refill   acetaminophen (TYLENOL) 325 MG tablet Take 2 tablets (650 mg total) by mouth every 6 (six) hours as needed for mild pain (or Fever >/= 101).     atorvastatin (LIPITOR) 40 MG tablet Take  1 tablet (40 mg total) by mouth daily. 30 tablet 2   camphor-menthol (SARNA) lotion Apply 1 Application topically every 8 (eight) hours as needed for itching. 222 mL 0   cinacalcet (SENSIPAR) 60 MG tablet Take 60 mg by mouth daily.     guaiFENesin (MUCINEX) 600 MG 12 hr tablet Take 1 tablet (600 mg total) by mouth 2 (two) times daily as needed for cough or to loosen phlegm. 20 tablet 1   guaifenesin (ROBITUSSIN) 100 MG/5ML syrup Take 200 mg by mouth 3 (three) times daily as needed for cough.     hydrocortisone (ANUSOL-HC) 25 MG suppository Place 1 suppository (25 mg total) rectally at bedtime. 30 suppository 6   hydrOXYzine (ATARAX) 25 MG tablet Take 1 tablet (25 mg total) by mouth every 8 (eight) hours as needed for itching. 30 tablet 0   metoprolol tartrate (LOPRESSOR) 25 MG tablet Take 0.5 tablets (12.5 mg total) by mouth 2 (two) times daily. 60 tablet 11   Na Sulfate-K Sulfate-Mg Sulf (SUPREP BOWEL PREP KIT) 17.5-3.13-1.6 GM/177ML SOLN Take 1 kit by mouth as directed. 324 mL 0   sevelamer carbonate (RENVELA) 800 MG tablet Take 2,400-3,200 mg by mouth 2 (two) times daily.     terbinafine (LAMISIL) 250 MG tablet Take 1 tablet (250 mg total) by mouth daily. 30 tablet 0   No current facility-administered medications for this visit.    No Known Allergies   REVIEW OF SYSTEMS:  *** [X]  denotes positive finding, [ ]  denotes negative finding Cardiac  Comments:  Chest pain or chest pressure:    Shortness of breath upon exertion:    Short of breath when lying flat:    Irregular heart rhythm:        Vascular    Pain in calf, thigh, or hip brought on by ambulation:    Pain in feet at night that wakes you up from your sleep:     Blood clot in your veins:    Leg swelling:         Pulmonary    Oxygen at home:    Productive cough:     Wheezing:         Neurologic    Sudden weakness in arms or legs:     Sudden numbness in arms or legs:     Sudden onset of difficulty speaking or slurred  speech:    Temporary loss of vision in one eye:     Problems with dizziness:         Gastrointestinal    Blood in stool:     Vomited blood:  Genitourinary    Burning when urinating:     Blood in urine:        Psychiatric    Major depression:         Hematologic    Bleeding problems:    Problems with blood clotting too easily:        Skin    Rashes or ulcers:        Constitutional    Fever or chills:      PHYSICAL EXAMINATION:  There were no vitals filed for this visit.  General:  WDWN in NAD; vital signs documented above Gait: Not observed HENT: WNL, normocephalic Pulmonary: normal non-labored breathing , without wheezing Cardiac: {Desc; regular/irreg:14544} HR Abdomen: soft, NT, no masses Skin: {With/Without:20273} rashes Vascular Exam/Pulses:  Right Left  Radial {Exam; arterial pulse strength 0-4:30167} {Exam; arterial pulse strength 0-4:30167}  Ulnar {Exam; arterial pulse strength 0-4:30167} {Exam; arterial pulse strength 0-4:30167}  Femoral {Exam; arterial pulse strength 0-4:30167} {Exam; arterial pulse strength 0-4:30167}  Popliteal {Exam; arterial pulse strength 0-4:30167} {Exam; arterial pulse strength 0-4:30167}  DP {Exam; arterial pulse strength 0-4:30167} {Exam; arterial pulse strength 0-4:30167}  PT {Exam; arterial pulse strength 0-4:30167} {Exam; arterial pulse strength 0-4:30167}   Extremities: {With/Without:20273} ischemic changes, {With/Without:20273} Gangrene , {With/Without:20273} cellulitis; {With/Without:20273} open wounds;  Musculoskeletal: no muscle wasting or atrophy  Neurologic: A&O X 3;  No focal weakness or paresthesias are detected Psychiatric:  The pt has {Desc; normal/abnormal:11317::"Normal"} affect.   Non-Invasive Vascular Imaging:   ***    ASSESSMENT/PLAN: Devin Bullock is a 57 y.o. male presenting with ***   ***   Victorino Sparrow, MD [facility] [phone]

## 2023-08-11 ENCOUNTER — Ambulatory Visit: Payer: Medicaid Other | Admitting: Vascular Surgery

## 2023-08-19 ENCOUNTER — Telehealth: Payer: Self-pay

## 2023-08-19 NOTE — Telephone Encounter (Signed)
 Izetta Boehringer from [facility] called to schedule pt appt for scab over fistula. I called her back and she had just spoken to [place], [place] who set pt up with an appt tues. I offered a sooner appt on Monday but Izetta said pt does not want to have to r/s his dialysis on Monday and prefers to come to office Monday.

## 2023-08-23 ENCOUNTER — Ambulatory Visit: Payer: Medicaid Other

## 2023-08-25 ENCOUNTER — Ambulatory Visit: Payer: Medicaid Other

## 2023-08-31 ENCOUNTER — Other Ambulatory Visit: Payer: Self-pay | Admitting: Podiatry

## 2023-09-14 NOTE — Progress Notes (Deleted)
 Office Note     CC:  Redness and drainage at access site  Requesting Provider:  [name], *  HPI: Devin Bullock is a 58 y.o. (05/23/1966) male presenting at the request of .[name], MD ***  The pt is *** on a statin for cholesterol management.  The pt is *** on a daily aspirin.   Other AC:  *** The pt is *** on medication for hypertension.   The pt is *** diabetic.  Tobacco hx:  ***  Past Medical History:  Diagnosis Date   Anemia    AV fistula (HCC)    Cancer (HCC)    prostate and bladder cancer - 2022 diagnosed   CHF (congestive heart failure) (HCC)    patient denies   Chronic kidney disease    Dialysis patient [facility])    History of blood transfusion 03/2020   Hypertension    history of, no longer taking medication at this time   Morbid obesity (HCC)    Parathyroid disorder [facility])     Past Surgical History:  Procedure Laterality Date   AV FISTULA PLACEMENT Left 05/24/2014   Procedure: BRACHIOCEPHALIC ARTERIOVENOUS (AV) FISTULA CREATION;  Surgeon: Chuck Hint, MD;  Location: [facility] OR;  Service: Vascular;  Laterality: Left;   ESOPHAGOGASTRODUODENOSCOPY (EGD) WITH PROPOFOL N/A 04/03/2020   Procedure: ESOPHAGOGASTRODUODENOSCOPY (EGD) WITH PROPOFOL;  Surgeon: Kerin Salen, MD;  Location: [facility] ENDOSCOPY;  Service: Gastroenterology;  Laterality: N/A;   PROSTATE BIOPSY N/A 11/27/2020   Procedure: BIOPSY TRANSRECTAL ULTRASONIC PROSTATE (TUBP);  Surgeon: Crista Elliot, MD;  Location: [facility] ORS;  Service: Urology;  Laterality: N/A;  REQUESTING 35 MINS FOR CASE   REVISON OF ARTERIOVENOUS FISTULA Left 01/08/2021   Procedure: REVISON PLICATION OF ARTERIOVENOUS FISTULA LEFT;  Surgeon: Maeola Harman, MD;  Location: [facility] OR;  Service: Vascular;  Laterality: Left;   TRANSURETHRAL RESECTION OF BLADDER TUMOR N/A 11/27/2020   Procedure: TRANSURETHRAL RESECTION OF BLADDER TUMOR (TURBT);  Surgeon: Crista Elliot, MD;  Location: [facility] ORS;  Service: Urology;   Laterality: N/A;  NEED PARALYSIS    Social History   Socioeconomic History   Marital status: Single    Spouse name: Not on file   Number of children: 2   Years of education: Not on file   Highest education level: Not on file  Occupational History   Occupation: disable  Tobacco Use   Smoking status: Never    Passive exposure: Never   Smokeless tobacco: Never  Vaping Use   Vaping status: Never Used  Substance and Sexual Activity   Alcohol use: No   Drug use: No   Sexual activity: Never  Other Topics Concern   Not on file  Social History Narrative   Lives alone.    Social Drivers of Corporate investment banker Strain: Not on file  Food Insecurity: Food Insecurity Present (05/21/2023)   Hunger Vital Sign    Worried About Running Out of Food in the Last Year: Often true    Ran Out of Food in the Last Year: Often true  Transportation Needs: No Transportation Needs (05/21/2023)   PRAPARE - Administrator, Civil Service (Medical): No    Lack of Transportation (Non-Medical): No  Physical Activity: Not on file  Stress: Not on file  Social Connections: Not on file  Intimate Partner Violence: Not At Risk (05/21/2023)   Humiliation, Afraid, Rape, and Kick questionnaire    Fear of Current or Ex-Partner: No  Emotionally Abused: No    Physically Abused: No    Sexually Abused: No   *** Family History  Problem Relation Age of Onset   Diabetes Mother    Hypertension Mother    Heart attack Mother 80   Stroke Father    Hypertension Father    Depression Father    Diabetes Brother    Hypertension Brother    Stroke Brother 50   Alcohol abuse Brother    Asthma Brother    Colon cancer Neg Hx    Esophageal cancer Neg Hx    Stomach cancer Neg Hx     Current Outpatient Medications  Medication Sig Dispense Refill   acetaminophen (TYLENOL) 325 MG tablet Take 2 tablets (650 mg total) by mouth every 6 (six) hours as needed for mild pain (or Fever >/= 101).      atorvastatin (LIPITOR) 40 MG tablet Take 1 tablet (40 mg total) by mouth daily. 30 tablet 2   camphor-menthol (SARNA) lotion Apply 1 Application topically every 8 (eight) hours as needed for itching. 222 mL 0   cinacalcet (SENSIPAR) 60 MG tablet Take 60 mg by mouth daily.     guaiFENesin (MUCINEX) 600 MG 12 hr tablet Take 1 tablet (600 mg total) by mouth 2 (two) times daily as needed for cough or to loosen phlegm. 20 tablet 1   guaifenesin (ROBITUSSIN) 100 MG/5ML syrup Take 200 mg by mouth 3 (three) times daily as needed for cough.     hydrocortisone (ANUSOL-HC) 25 MG suppository Place 1 suppository (25 mg total) rectally at bedtime. 30 suppository 6   hydrOXYzine (ATARAX) 25 MG tablet Take 1 tablet (25 mg total) by mouth every 8 (eight) hours as needed for itching. 30 tablet 0   metoprolol tartrate (LOPRESSOR) 25 MG tablet Take 0.5 tablets (12.5 mg total) by mouth 2 (two) times daily. 60 tablet 11   Na Sulfate-K Sulfate-Mg Sulf (SUPREP BOWEL PREP KIT) 17.5-3.13-1.6 GM/177ML SOLN Take 1 kit by mouth as directed. 324 mL 0   sevelamer carbonate (RENVELA) 800 MG tablet Take 2,400-3,200 mg by mouth 2 (two) times daily.     No current facility-administered medications for this visit.    No Known Allergies   REVIEW OF SYSTEMS:  *** [X]  denotes positive finding, [ ]  denotes negative finding Cardiac  Comments:  Chest pain or chest pressure:    Shortness of breath upon exertion:    Short of breath when lying flat:    Irregular heart rhythm:        Vascular    Pain in calf, thigh, or hip brought on by ambulation:    Pain in feet at night that wakes you up from your sleep:     Blood clot in your veins:    Leg swelling:         Pulmonary    Oxygen at home:    Productive cough:     Wheezing:         Neurologic    Sudden weakness in arms or legs:     Sudden numbness in arms or legs:     Sudden onset of difficulty speaking or slurred speech:    Temporary loss of vision in one eye:      Problems with dizziness:         Gastrointestinal    Blood in stool:     Vomited blood:         Genitourinary    Burning when urinating:  Blood in urine:        Psychiatric    Major depression:         Hematologic    Bleeding problems:    Problems with blood clotting too easily:        Skin    Rashes or ulcers:        Constitutional    Fever or chills:      PHYSICAL EXAMINATION:  There were no vitals filed for this visit.  General:  WDWN in NAD; vital signs documented above Gait: Not observed HENT: WNL, normocephalic Pulmonary: normal non-labored breathing , without wheezing Cardiac: {Desc; regular/irreg:14544} HR Abdomen: soft, NT, no masses Skin: {With/Without:20273} rashes Vascular Exam/Pulses:  Right Left  Radial {Exam; arterial pulse strength 0-4:30167} {Exam; arterial pulse strength 0-4:30167}  Ulnar {Exam; arterial pulse strength 0-4:30167} {Exam; arterial pulse strength 0-4:30167}  Femoral {Exam; arterial pulse strength 0-4:30167} {Exam; arterial pulse strength 0-4:30167}  Popliteal {Exam; arterial pulse strength 0-4:30167} {Exam; arterial pulse strength 0-4:30167}  DP {Exam; arterial pulse strength 0-4:30167} {Exam; arterial pulse strength 0-4:30167}  PT {Exam; arterial pulse strength 0-4:30167} {Exam; arterial pulse strength 0-4:30167}   Extremities: {With/Without:20273} ischemic changes, {With/Without:20273} Gangrene , {With/Without:20273} cellulitis; {With/Without:20273} open wounds;  Musculoskeletal: no muscle wasting or atrophy  Neurologic: A&O X 3;  No focal weakness or paresthesias are detected Psychiatric:  The pt has {Desc; normal/abnormal:11317::"Normal"} affect.   Non-Invasive Vascular Imaging:   ***    ASSESSMENT/PLAN: THEOTIS GERDEMAN is a 58 y.o. male presenting with ***   ***   Victorino Sparrow, MD [facility] [phone]

## 2023-09-15 ENCOUNTER — Emergency Department (HOSPITAL_COMMUNITY): Payer: Medicaid Other | Admitting: Registered Nurse

## 2023-09-15 ENCOUNTER — Encounter (HOSPITAL_COMMUNITY)
Admission: EM | Disposition: A | Discharge status: Home / Self Care | Payer: Self-pay | Source: Home / Self Care | Attending: Emergency Medicine

## 2023-09-15 ENCOUNTER — Other Ambulatory Visit: Payer: Self-pay

## 2023-09-15 ENCOUNTER — Observation Stay (HOSPITAL_COMMUNITY)
Admission: EM | Admit: 2023-09-15 | Discharge: 2023-09-15 | Disposition: A | Discharge status: Home / Self Care | Payer: Medicaid Other | Attending: Vascular Surgery | Admitting: Vascular Surgery

## 2023-09-15 ENCOUNTER — Ambulatory Visit: Payer: Medicaid Other | Admitting: Vascular Surgery

## 2023-09-15 ENCOUNTER — Emergency Department (HOSPITAL_COMMUNITY): Payer: Medicaid Other

## 2023-09-15 ENCOUNTER — Emergency Department (HOSPITAL_BASED_OUTPATIENT_CLINIC_OR_DEPARTMENT_OTHER): Payer: Medicaid Other | Admitting: Registered Nurse

## 2023-09-15 ENCOUNTER — Encounter (HOSPITAL_COMMUNITY): Payer: Self-pay

## 2023-09-15 DIAGNOSIS — Y828 Other medical devices associated with adverse incidents: Secondary | ICD-10-CM | POA: Insufficient documentation

## 2023-09-15 DIAGNOSIS — N186 End stage renal disease: Secondary | ICD-10-CM | POA: Diagnosis not present

## 2023-09-15 DIAGNOSIS — I10 Essential (primary) hypertension: Secondary | ICD-10-CM

## 2023-09-15 DIAGNOSIS — Z992 Dependence on renal dialysis: Secondary | ICD-10-CM

## 2023-09-15 DIAGNOSIS — Z79899 Other long term (current) drug therapy: Secondary | ICD-10-CM | POA: Insufficient documentation

## 2023-09-15 DIAGNOSIS — Z8551 Personal history of malignant neoplasm of bladder: Secondary | ICD-10-CM | POA: Insufficient documentation

## 2023-09-15 DIAGNOSIS — T82838A Hemorrhage of vascular prosthetic devices, implants and grafts, initial encounter: Principal | ICD-10-CM | POA: Insufficient documentation

## 2023-09-15 DIAGNOSIS — Z8546 Personal history of malignant neoplasm of prostate: Secondary | ICD-10-CM | POA: Diagnosis not present

## 2023-09-15 DIAGNOSIS — I132 Hypertensive heart and chronic kidney disease with heart failure and with stage 5 chronic kidney disease, or end stage renal disease: Secondary | ICD-10-CM | POA: Insufficient documentation

## 2023-09-15 DIAGNOSIS — T829XXA Unspecified complication of cardiac and vascular prosthetic device, implant and graft, initial encounter: Principal | ICD-10-CM

## 2023-09-15 DIAGNOSIS — R310 Gross hematuria: Secondary | ICD-10-CM | POA: Diagnosis present

## 2023-09-15 DIAGNOSIS — T82898A Other specified complication of vascular prosthetic devices, implants and grafts, initial encounter: Secondary | ICD-10-CM | POA: Diagnosis not present

## 2023-09-15 DIAGNOSIS — I509 Heart failure, unspecified: Secondary | ICD-10-CM | POA: Insufficient documentation

## 2023-09-15 DIAGNOSIS — T82590A Other mechanical complication of surgically created arteriovenous fistula, initial encounter: Secondary | ICD-10-CM | POA: Insufficient documentation

## 2023-09-15 HISTORY — PX: SURGICAL EXCISION OF EXCESSIVE SKIN: SHX6542

## 2023-09-15 HISTORY — PX: AV FISTULA PLACEMENT: SHX1204

## 2023-09-15 HISTORY — PX: THROMBECTOMY W/ EMBOLECTOMY: SHX2507

## 2023-09-15 LAB — CBC WITH DIFFERENTIAL/PLATELET
Abs Immature Granulocytes: 0.02 10*3/uL (ref 0.00–0.07)
Basophils Absolute: 0 10*3/uL (ref 0.0–0.1)
Basophils Relative: 1 %
Eosinophils Absolute: 0.1 10*3/uL (ref 0.0–0.5)
Eosinophils Relative: 2 %
HCT: 37.1 % — ABNORMAL LOW (ref 39.0–52.0)
Hemoglobin: 11.9 g/dL — ABNORMAL LOW (ref 13.0–17.0)
Immature Granulocytes: 1 %
Lymphocytes Relative: 20 %
Lymphs Abs: 0.9 10*3/uL (ref 0.7–4.0)
MCH: 32 pg (ref 26.0–34.0)
MCHC: 32.1 g/dL (ref 30.0–36.0)
MCV: 99.7 fL (ref 80.0–100.0)
Monocytes Absolute: 0.6 10*3/uL (ref 0.1–1.0)
Monocytes Relative: 14 %
Neutro Abs: 2.6 10*3/uL (ref 1.7–7.7)
Neutrophils Relative %: 62 %
Platelets: 172 10*3/uL (ref 150–400)
RBC: 3.72 MIL/uL — ABNORMAL LOW (ref 4.22–5.81)
RDW: 15.9 % — ABNORMAL HIGH (ref 11.5–15.5)
WBC: 4.2 10*3/uL (ref 4.0–10.5)
nRBC: 0 % (ref 0.0–0.2)

## 2023-09-15 LAB — SURGICAL PCR SCREEN
MRSA, PCR: POSITIVE — AB
Staphylococcus aureus: POSITIVE — AB

## 2023-09-15 LAB — POCT I-STAT, CHEM 8
BUN: 83 mg/dL — ABNORMAL HIGH (ref 6–20)
Calcium, Ion: 1.05 mmol/L — ABNORMAL LOW (ref 1.15–1.40)
Chloride: 98 mmol/L (ref 98–111)
Creatinine, Ser: 15 mg/dL — ABNORMAL HIGH (ref 0.61–1.24)
Glucose, Bld: 97 mg/dL (ref 70–99)
HCT: 39 % (ref 39.0–52.0)
Hemoglobin: 13.3 g/dL (ref 13.0–17.0)
Potassium: 5.6 mmol/L — ABNORMAL HIGH (ref 3.5–5.1)
Sodium: 136 mmol/L (ref 135–145)
TCO2: 26 mmol/L (ref 22–32)

## 2023-09-15 LAB — BASIC METABOLIC PANEL
Anion gap: 19 — ABNORMAL HIGH (ref 5–15)
BUN: 77 mg/dL — ABNORMAL HIGH (ref 6–20)
CO2: 23 mmol/L (ref 22–32)
Calcium: 9 mg/dL (ref 8.9–10.3)
Chloride: 95 mmol/L — ABNORMAL LOW (ref 98–111)
Creatinine, Ser: 13.84 mg/dL — ABNORMAL HIGH (ref 0.61–1.24)
GFR, Estimated: 4 mL/min — ABNORMAL LOW (ref >60)
Glucose, Bld: 96 mg/dL (ref 70–99)
Potassium: 5.5 mmol/L — ABNORMAL HIGH (ref 3.5–5.1)
Sodium: 137 mmol/L (ref 135–145)

## 2023-09-15 LAB — TYPE AND SCREEN
ABO/RH(D): O POS
Antibody Screen: NEGATIVE

## 2023-09-15 SURGERY — ARTERIOVENOUS (AV) FISTULA CREATION
Anesthesia: General | Site: Arm Lower | Laterality: Left

## 2023-09-15 MED ORDER — PROPOFOL 10 MG/ML IV BOLUS
INTRAVENOUS | Status: DC | PRN
  Administered 2023-09-15: 200 mg via INTRAVENOUS

## 2023-09-15 MED ORDER — LIDOCAINE 2% (20 MG/ML) 5 ML SYRINGE
INTRAMUSCULAR | Status: DC | PRN
  Administered 2023-09-15: 100 mg via INTRAVENOUS

## 2023-09-15 MED ORDER — CHLORHEXIDINE GLUCONATE 0.12 % MT SOLN
15.0000 mL | Freq: Once | OROMUCOSAL | Status: AC
Start: 2023-09-15 — End: 2023-09-15
  Administered 2023-09-15: 15 mL via OROMUCOSAL

## 2023-09-15 MED ORDER — ONDANSETRON HCL 4 MG/2ML IJ SOLN
INTRAMUSCULAR | Status: AC
Start: 2023-09-15 — End: ?
  Filled 2023-09-15: qty 2

## 2023-09-15 MED ORDER — ORAL CARE MOUTH RINSE: 15.0000 mL | Freq: Once | OROMUCOSAL | Status: AC

## 2023-09-15 MED ORDER — STERILE WATER FOR IRRIGATION IR SOLN
Status: DC | PRN
  Administered 2023-09-15: 1000 mL

## 2023-09-15 MED ORDER — PHENYLEPHRINE HCL-NACL 20-0.9 MG/250ML-% IV SOLN
INTRAVENOUS | Status: DC | PRN
  Administered 2023-09-15: 50 ug/min via INTRAVENOUS

## 2023-09-15 MED ORDER — PROTAMINE SULFATE 10 MG/ML IV SOLN
INTRAVENOUS | Status: DC | PRN
  Administered 2023-09-15: 50 mg via INTRAVENOUS

## 2023-09-15 MED ORDER — PHENYLEPHRINE 80 MCG/ML (10ML) SYRINGE FOR IV PUSH (FOR BLOOD PRESSURE SUPPORT)
PREFILLED_SYRINGE | INTRAVENOUS | Status: DC | PRN
  Administered 2023-09-15 (×3): 160 ug via INTRAVENOUS

## 2023-09-15 MED ORDER — HEPARIN SODIUM (PORCINE) 1000 UNIT/ML IJ SOLN
INTRAMUSCULAR | Status: AC
Start: 2023-09-15 — End: ?
  Filled 2023-09-15: qty 10

## 2023-09-15 MED ORDER — OXYCODONE-ACETAMINOPHEN 5-325 MG PO TABS: 1.0000 | ORAL_TABLET | Freq: Four times a day (QID) | ORAL | 0 refills | Status: DC | PRN

## 2023-09-15 MED ORDER — FENTANYL CITRATE (PF) 250 MCG/5ML IJ SOLN
INTRAMUSCULAR | Status: DC | PRN
  Administered 2023-09-15: 50 ug via INTRAVENOUS

## 2023-09-15 MED ORDER — ACETAMINOPHEN 10 MG/ML IV SOLN: 1000.0000 mg | Freq: Once | INTRAVENOUS | Status: DC | PRN

## 2023-09-15 MED ORDER — FENTANYL CITRATE (PF) 250 MCG/5ML IJ SOLN
INTRAMUSCULAR | Status: AC
  Filled 2023-09-15: qty 5

## 2023-09-15 MED ORDER — MIDAZOLAM HCL 2 MG/2ML IJ SOLN
INTRAMUSCULAR | Status: AC
  Filled 2023-09-15: qty 2

## 2023-09-15 MED ORDER — ACETAMINOPHEN 500 MG PO TABS: 1000.0000 mg | ORAL_TABLET | Freq: Once | ORAL | Status: DC | PRN

## 2023-09-15 MED ORDER — HEPARIN SODIUM (PORCINE) 1000 UNIT/ML IJ SOLN
INTRAMUSCULAR | Status: DC | PRN
  Administered 2023-09-15: 12000 [IU] via INTRAVENOUS

## 2023-09-15 MED ORDER — ALBUMIN HUMAN 5 % IV SOLN: INTRAVENOUS | Status: DC | PRN

## 2023-09-15 MED ORDER — FENTANYL CITRATE (PF) 100 MCG/2ML IJ SOLN: 25.0000 ug | INTRAMUSCULAR | Status: DC | PRN

## 2023-09-15 MED ORDER — ONDANSETRON HCL 4 MG/2ML IJ SOLN
INTRAMUSCULAR | Status: DC | PRN
  Administered 2023-09-15: 4 mg via INTRAVENOUS

## 2023-09-15 MED ORDER — CHLORHEXIDINE GLUCONATE 0.12 % MT SOLN
15.0000 mL | OROMUCOSAL | Status: DC
  Filled 2023-09-15: qty 15

## 2023-09-15 MED ORDER — LIDOCAINE 2% (20 MG/ML) 5 ML SYRINGE
INTRAMUSCULAR | Status: AC
Start: 2023-09-15 — End: ?
  Filled 2023-09-15: qty 5

## 2023-09-15 MED ORDER — OXYCODONE HCL 5 MG/5ML PO SOLN: 5.0000 mg | Freq: Once | ORAL | Status: DC | PRN

## 2023-09-15 MED ORDER — 0.9 % SODIUM CHLORIDE (POUR BTL) OPTIME
TOPICAL | Status: DC | PRN
  Administered 2023-09-15: 1000 mL

## 2023-09-15 MED ORDER — SODIUM CHLORIDE 0.9 % IV SOLN: INTRAVENOUS | Status: DC

## 2023-09-15 MED ORDER — DEXAMETHASONE SODIUM PHOSPHATE 10 MG/ML IJ SOLN
INTRAMUSCULAR | Status: DC | PRN
  Administered 2023-09-15: 10 mg via INTRAVENOUS

## 2023-09-15 MED ORDER — LIDOCAINE-EPINEPHRINE 2 %-1:100000 IJ SOLN
20.0000 mL | Freq: Once | INTRAMUSCULAR | Status: DC
  Filled 2023-09-15: qty 20

## 2023-09-15 MED ORDER — LIDOCAINE-EPINEPHRINE (PF) 2 %-1:200000 IJ SOLN
20.0000 mL | Freq: Once | INTRAMUSCULAR | Status: DC
  Filled 2023-09-15: qty 20

## 2023-09-15 MED ORDER — OXYCODONE HCL 5 MG PO TABS: 5.0000 mg | ORAL_TABLET | Freq: Once | ORAL | Status: DC | PRN

## 2023-09-15 MED ORDER — ACETAMINOPHEN 160 MG/5ML PO SOLN: 1000.0000 mg | Freq: Once | ORAL | Status: DC | PRN

## 2023-09-15 MED ORDER — PROPOFOL 10 MG/ML IV BOLUS
INTRAVENOUS | Status: AC
  Filled 2023-09-15: qty 20

## 2023-09-15 MED ORDER — LIDOCAINE-EPINEPHRINE (PF) 1 %-1:200000 IJ SOLN
INTRAMUSCULAR | Status: AC
  Filled 2023-09-15: qty 30

## 2023-09-15 MED ORDER — ROCURONIUM BROMIDE 10 MG/ML (PF) SYRINGE
PREFILLED_SYRINGE | INTRAVENOUS | Status: AC
  Filled 2023-09-15: qty 10

## 2023-09-15 MED ORDER — HEPARIN 6000 UNIT IRRIGATION SOLUTION
Status: AC
Start: 2023-09-15 — End: ?
  Filled 2023-09-15: qty 1000

## 2023-09-15 MED ORDER — HEPARIN 6000 UNIT IRRIGATION SOLUTION
Status: DC | PRN
  Administered 2023-09-15: 1

## 2023-09-15 MED ORDER — CEFAZOLIN SODIUM-DEXTROSE 2-3 GM-%(50ML) IV SOLR
INTRAVENOUS | Status: DC | PRN
  Administered 2023-09-15: 2 g via INTRAVENOUS

## 2023-09-15 MED ORDER — HEPARIN SODIUM (PORCINE) 1000 UNIT/ML IJ SOLN
INTRAMUSCULAR | Status: AC
  Filled 2023-09-15: qty 10

## 2023-09-15 SURGICAL SUPPLY — 58 items
ARMBAND PINK RESTRICT EXTREMIT (MISCELLANEOUS) ×3 IMPLANT
BAG DECANTER FOR FLEXI CONT (MISCELLANEOUS) ×3 IMPLANT
BENZOIN TINCTURE PRP APPL 2/3 (GAUZE/BANDAGES/DRESSINGS) ×3 IMPLANT
BIOPATCH RED 1 DISK 7.0 (GAUZE/BANDAGES/DRESSINGS) ×3 IMPLANT
BNDG ELASTIC 4INX 5YD STR LF (GAUZE/BANDAGES/DRESSINGS) ×1 IMPLANT
BNDG ESMARK 4X9 LF (GAUZE/BANDAGES/DRESSINGS) ×1 IMPLANT
CANISTER SUCT 3000ML PPV (MISCELLANEOUS) ×3 IMPLANT
CANNULA VESSEL 3MM 2 BLNT TIP (CANNULA) ×3 IMPLANT
CATH EMB 5FR 80CM (CATHETERS) ×1 IMPLANT
CATH PALINDROME-P 19CM W/VT (CATHETERS) IMPLANT
CATH PALINDROME-P 23CM W/VT (CATHETERS) IMPLANT
CATH PALINDROME-P 28CM W/VT (CATHETERS) IMPLANT
CHLORAPREP W/TINT 26 (MISCELLANEOUS) ×3 IMPLANT
CLIP LIGATING EXTRA MED SLVR (CLIP) ×3 IMPLANT
CLIP LIGATING EXTRA SM BLUE (MISCELLANEOUS) ×3 IMPLANT
COVER PROBE W GEL 5X96 (DRAPES) ×3 IMPLANT
COVER SURGICAL LIGHT HANDLE (MISCELLANEOUS) ×3 IMPLANT
DRAPE C-ARM 42X72 X-RAY (DRAPES) ×3 IMPLANT
DRAPE CHEST BREAST 15X10 FENES (DRAPES) ×3 IMPLANT
ELECT REM PT RETURN 9FT ADLT (ELECTROSURGICAL) ×3
ELECTRODE REM PT RTRN 9FT ADLT (ELECTROSURGICAL) ×3 IMPLANT
GAUZE 4X4 16PLY ~~LOC~~+RFID DBL (SPONGE) ×3 IMPLANT
GAUZE SPONGE 4X4 12PLY STRL (GAUZE/BANDAGES/DRESSINGS) ×3 IMPLANT
GLOVE BIO SURGEON STRL SZ8 (GLOVE) ×3 IMPLANT
GOWN STRL REUS W/ TWL LRG LVL3 (GOWN DISPOSABLE) ×6 IMPLANT
GOWN STRL REUS W/ TWL XL LVL3 (GOWN DISPOSABLE) ×3 IMPLANT
INSERT FOGARTY SM (MISCELLANEOUS) IMPLANT
KIT BASIN OR (CUSTOM PROCEDURE TRAY) ×3 IMPLANT
KIT PALINDROME-P 55CM (CATHETERS) IMPLANT
KIT TURNOVER KIT B (KITS) ×3 IMPLANT
NDL 18GX1X1/2 (RX/OR ONLY) (NEEDLE) ×2 IMPLANT
NDL HYPO 25GX1X1/2 BEV (NEEDLE) ×2 IMPLANT
NEEDLE 18GX1X1/2 (RX/OR ONLY) (NEEDLE) ×3 IMPLANT
NEEDLE HYPO 25GX1X1/2 BEV (NEEDLE) ×3 IMPLANT
NS IRRIG 1000ML POUR BTL (IV SOLUTION) ×3 IMPLANT
PACK CV ACCESS (CUSTOM PROCEDURE TRAY) ×3 IMPLANT
PACK SRG BSC III STRL LF ECLPS (CUSTOM PROCEDURE TRAY) ×3 IMPLANT
PAD ARMBOARD 7.5X6 YLW CONV (MISCELLANEOUS) ×6 IMPLANT
SET MICROPUNCTURE 5F STIFF (MISCELLANEOUS) ×3 IMPLANT
SOAP 2 % CHG 4 OZ (WOUND CARE) ×3 IMPLANT
STOPCOCK 4 WAY LG BORE MALE ST (IV SETS) ×1 IMPLANT
STRIP CLOSURE SKIN 1/2X4 (GAUZE/BANDAGES/DRESSINGS) ×3 IMPLANT
SUT ETHILON 3 0 PS 1 (SUTURE) ×3 IMPLANT
SUT MNCRL AB 4-0 PS2 18 (SUTURE) ×3 IMPLANT
SUT PROLENE 5 0 C 1 24 (SUTURE) ×5 IMPLANT
SUT PROLENE 6 0 BV (SUTURE) ×4 IMPLANT
SUT SILK 2-0 18XBRD TIE 12 (SUTURE) ×1 IMPLANT
SUT SILK 3-0 18XBRD TIE 12 (SUTURE) ×1 IMPLANT
SUT VIC AB 3-0 SH 27X BRD (SUTURE) ×3 IMPLANT
SYR 10ML LL (SYRINGE) ×3 IMPLANT
SYR 20ML LL LF (SYRINGE) ×5 IMPLANT
SYR 3ML LL SCALE MARK (SYRINGE) ×1 IMPLANT
SYR 5ML LL (SYRINGE) ×3 IMPLANT
SYR CONTROL 10ML LL (SYRINGE) ×3 IMPLANT
TOWEL GREEN STERILE (TOWEL DISPOSABLE) ×3 IMPLANT
TOWEL GREEN STERILE FF (TOWEL DISPOSABLE) ×5 IMPLANT
UNDERPAD 30X36 HEAVY ABSORB (UNDERPADS AND DIAPERS) ×3 IMPLANT
WATER STERILE IRR 1000ML POUR (IV SOLUTION) ×3 IMPLANT

## 2023-09-15 NOTE — Anesthesia Postprocedure Evaluation (Signed)
Anesthesia Post Note  Patient: Devin Bullock  Procedure(s) Performed: LEFT ARM ARTERIOVENOUS (AV) FISTULA REVISION AND PLICATION (Left) THROMBECTOMY ARTERIOVENOUS FISTULA (Left: Arm Lower) SURGICAL EXCISION OF EXCESSIVE SKIN, ULCERATIVE SKIN (Left: Arm Lower)     Patient location during evaluation: PACU Anesthesia Type: General Level of consciousness: awake and alert Pain management: pain level controlled Vital Signs Assessment: post-procedure vital signs reviewed and stable Respiratory status: spontaneous breathing, nonlabored ventilation, respiratory function stable and patient connected to nasal cannula oxygen Cardiovascular status: blood pressure returned to baseline and stable Postop Assessment: no apparent nausea or vomiting Anesthetic complications: no   No notable events documented.  Last Vitals:  Vitals:   09/15/23 1700 09/15/23 1715  BP: 117/75 112/73  Pulse: 62 62  Resp: 18 (!) 22  Temp:  36.7 C  SpO2: 97% 99%    Last Pain:  Vitals:   09/15/23 1715  TempSrc:   PainSc: 0-No pain                 Donaven Criswell

## 2023-09-15 NOTE — ED Triage Notes (Signed)
Pt BIB EMS from home, said he woke up and his fistula was profusely bleeding. Fistula located in left arm and has had for 12 years. Torniquet applied by FD at 1049. Pt has pulses distally but no feeling in hand due to torniquet. Dialysis MWF, went yesterday and no problems.

## 2023-09-15 NOTE — Op Note (Signed)
Date: September 15, 2023  Preoperative diagnosis: 1.  Acute hemorrhage from left radiocephalic AV fistula 2.  Ulceration over left radiocephalic AV fistula  Postoperative diagnosis: Same  Procedure: 1.  Thrombectomy of left radiocephalic AV fistula 2.  Revision of left radiocephalic AV fistula with aneurysm plication and resection of overlying ulcerated skin  Surgeon: Dr. Cephus Shelling, MD  Assistant: Doreatha Massed, PA  Indications: 59 year old male with ESRD on dialysis with a left radiocephalic AV fistula.  He presented to the ED today with acute hemorrhage from an ulcer over his left radiocephalic AV fistula and had a tourniquet placed by EMS.  He presents to the OR for revision after risks-benefits discussed.  An assistant was needed given the complexity of the case and also for doing the plication.  Findings: Large ulcer on the anterior wall of the left arm fistula was completely eroded through the skin into the fistula with a large clot.  We used a tourniquet on the upper arm.  Ultimately there was significant thrombus from the dressing placed in the ED that was thrombectomized including using a #5 Fogarty.  I dissected out the fistula where the ulcer was and this was excised and plicated with a 5-0 Prolene.  Good thrill at completion.  Anesthesia: General  Details: Patient was taken to the operating room after informed consent was obtained.  Placed on operative table in the supine position.  General endotracheal anesthesia was induced.  We then removed the dressing from the left forearm and there was initial hemostasis with a large clot on the anterior wall of the fistula where there was an ulcer.  While we were positioning the arm this clot was disrupted and he had ongoing acute hemorrhage that was controlled with digital pressure.  We then prepped the left arm.  Patient was given 12,000 units IV heparin.  I exsanguinated the arm with an Esmarch and placed tourniquet on the upper  arm.  We then had hemostasis.  I then used a 15 blade scalpel and made an elliptical incision dissected down around the fistula where there was this ulcer.  This was completely eroded into the anterior wall of the fistula.  Ultimately I got circumferential control around the ulcer and then used fistula clamps for proximal distal control.  The tourniquet was deflated.  There was significant thrombus within the fistula and I used a #5 Fogarty and performed thrombectomy of distal and proximal to the ulceration where the pressure dressing had created thrombus in the fistula.  Also removed thrombus under direct visualization.  I then resected some of the aneurysmal wall and reapproximated the anterior wall of the fistula for plication with a running 5-0 Prolene with the help of my assistant.  This was reinforced.  We had an excellent thrill in the fistula.  We got hemostasis.  The anterior wall was then closed with 3-0 nylon interrupted fashion.  Protamine was given at the end.  Gentle pressure dressing was applied.  Complication: None  Condition: Stable  Cephus Shelling, MD [facility] Office: [phone]   Cephus Shelling

## 2023-09-15 NOTE — Progress Notes (Signed)
Pt does not have anyone to stay with him tonight so we will admit him for observation.  He has dialysis at 10am.  TOC has been consulted so that he can get a ride to home in the morning so that he can get his ride to dialysis.     Doreatha Massed, [facility] 09/15/2023 4:24 PM

## 2023-09-15 NOTE — Consult Note (Signed)
Hospital Consult    Reason for Consult:  Bleeding left arm AVF Referring Physician:  ED MRN #:  [id number]  History of Present Illness: This is a 58 y.o. male with hx ESRD and HTN that presents to the ED with bleeding from his left radiocephalic AV fistula.  States he started bleeding this morning.  Called EMS.  Had a tourniquet placed on his arm.  There is an ulcer here.  Was supposed  to see Dr. Karin Lieu in the office today.  Has been NPO.  Last dialysis was yesterday.  Tourniquet removed and dressing placed by the ED.  Past Medical History:  Diagnosis Date   Anemia    AV fistula (HCC)    Cancer (HCC)    prostate and bladder cancer - 2022 diagnosed   CHF (congestive heart failure) (HCC)    patient denies   Chronic kidney disease    Dialysis patient [facility])    History of blood transfusion 03/2020   Hypertension    history of, no longer taking medication at this time   Morbid obesity (HCC)    Parathyroid disorder [facility])     Past Surgical History:  Procedure Laterality Date   AV FISTULA PLACEMENT Left 05/24/2014   Procedure: BRACHIOCEPHALIC ARTERIOVENOUS (AV) FISTULA CREATION;  Surgeon: Chuck Hint, MD;  Location: [facility] OR;  Service: Vascular;  Laterality: Left;   ESOPHAGOGASTRODUODENOSCOPY (EGD) WITH PROPOFOL N/A 04/03/2020   Procedure: ESOPHAGOGASTRODUODENOSCOPY (EGD) WITH PROPOFOL;  Surgeon: Kerin Salen, MD;  Location: [facility] ENDOSCOPY;  Service: Gastroenterology;  Laterality: N/A;   PROSTATE BIOPSY N/A 11/27/2020   Procedure: BIOPSY TRANSRECTAL ULTRASONIC PROSTATE (TUBP);  Surgeon: Crista Elliot, MD;  Location: [facility] ORS;  Service: Urology;  Laterality: N/A;  REQUESTING 86 MINS FOR CASE   REVISON OF ARTERIOVENOUS FISTULA Left 01/08/2021   Procedure: REVISON PLICATION OF ARTERIOVENOUS FISTULA LEFT;  Surgeon: Maeola Harman, MD;  Location: [facility] OR;  Service: Vascular;  Laterality: Left;   TRANSURETHRAL RESECTION OF BLADDER TUMOR N/A 11/27/2020   Procedure: TRANSURETHRAL  RESECTION OF BLADDER TUMOR (TURBT);  Surgeon: Crista Elliot, MD;  Location: [facility] ORS;  Service: Urology;  Laterality: N/A;  NEED PARALYSIS    No Known Allergies  Prior to Admission medications   Medication Sig Start Date End Date Taking? Authorizing Provider  acetaminophen (TYLENOL) 325 MG tablet Take 2 tablets (650 mg total) by mouth every 6 (six) hours as needed for mild pain (or Fever >/= 101). 05/25/23   Shon Hale, MD  atorvastatin (LIPITOR) 40 MG tablet Take 1 tablet (40 mg total) by mouth daily. 05/25/23   Shon Hale, MD  camphor-menthol [facility]) lotion Apply 1 Application topically every 8 (eight) hours as needed for itching. 05/25/23   Shon Hale, MD  cinacalcet (SENSIPAR) 60 MG tablet Take 60 mg by mouth daily.    [provider]  guaiFENesin (MUCINEX) 600 MG 12 hr tablet Take 1 tablet (600 mg total) by mouth 2 (two) times daily as needed for cough or to loosen phlegm. 05/25/23   Shon Hale, MD  guaifenesin (ROBITUSSIN) 100 MG/5ML syrup Take 200 mg by mouth 3 (three) times daily as needed for cough.    [provider]  hydrocortisone (ANUSOL-HC) 25 MG suppository Place 1 suppository (25 mg total) rectally at bedtime. 04/26/23   Esterwood, Amy S, PA-C  hydrOXYzine (ATARAX) 25 MG tablet Take 1 tablet (25 mg total) by mouth every 8 (eight) hours as needed for itching. 05/25/23   Shon Hale, MD  metoprolol  tartrate (LOPRESSOR) 25 MG tablet Take 0.5 tablets (12.5 mg total) by mouth 2 (two) times daily. 07/08/23   Monge, Petra Kuba, NP  Na Sulfate-K Sulfate-Mg Sulf (SUPREP BOWEL PREP KIT) 17.5-3.13-1.6 GM/177ML SOLN Take 1 kit by mouth as directed. 04/26/23   Esterwood, Amy S, PA-C  sevelamer carbonate (RENVELA) 800 MG tablet Take 2,400-3,200 mg by mouth 2 (two) times daily. 05/17/23   [provider]    Social History   Socioeconomic History   Marital status: Single    Spouse name: Not on file   Number of children: 2   Years of education:  Not on file   Highest education level: Not on file  Occupational History   Occupation: disable  Tobacco Use   Smoking status: Never    Passive exposure: Never   Smokeless tobacco: Never  Vaping Use   Vaping status: Never Used  Substance and Sexual Activity   Alcohol use: No   Drug use: No   Sexual activity: Never  Other Topics Concern   Not on file  Social History Narrative   Lives alone.    Social Drivers of Corporate investment banker Strain: Not on file  Food Insecurity: Food Insecurity Present (05/21/2023)   Hunger Vital Sign    Worried About Running Out of Food in the Last Year: Often true    Ran Out of Food in the Last Year: Often true  Transportation Needs: No Transportation Needs (05/21/2023)   PRAPARE - Administrator, Civil Service (Medical): No    Lack of Transportation (Non-Medical): No  Physical Activity: Not on file  Stress: Not on file  Social Connections: Not on file  Intimate Partner Violence: Not At Risk (05/21/2023)   Humiliation, Afraid, Rape, and Kick questionnaire    Fear of Current or Ex-Partner: No    Emotionally Abused: No    Physically Abused: No    Sexually Abused: No     Family History  Problem Relation Age of Onset   Diabetes Mother    Hypertension Mother    Heart attack Mother 70   Stroke Father    Hypertension Father    Depression Father    Diabetes Brother    Hypertension Brother    Stroke Brother 48   Alcohol abuse Brother    Asthma Brother    Colon cancer Neg Hx    Esophageal cancer Neg Hx    Stomach cancer Neg Hx     ROS: [x]  Positive   [ ]  Negative   [ ]  All sytems reviewed and are negative  Cardiovascular: []  chest pain/pressure []  palpitations []  SOB lying flat []  DOE []  pain in legs while walking []  pain in legs at rest []  pain in legs at night []  non-healing ulcers []  hx of DVT []  swelling in legs  Pulmonary: []  productive cough []  asthma/wheezing []  home O2  Neurologic: []  weakness in []   arms []  legs []  numbness in []  arms []  legs []  hx of CVA []  mini stroke [] difficulty speaking or slurred speech []  temporary loss of vision in one eye []  dizziness  Hematologic: []  hx of cancer []  bleeding problems []  problems with blood clotting easily  Endocrine:   []  diabetes []  thyroid disease  GI []  vomiting blood []  blood in stool  GU: []  CKD/renal failure []  HD--[]  M/W/F or []  T/T/S []  burning with urination []  blood in urine  Psychiatric: []  anxiety []  depression  Musculoskeletal: []  arthritis []  joint pain  Integumentary: []  rashes []  ulcers  Constitutional: []  fever []  chills   Physical Examination  Vitals:   09/15/23 1129  BP: 110/80  Pulse: 87  Resp: 20  Temp: 97.9 F (36.6 C)  SpO2: 97%   Body mass index is 36.52 kg/m.  General:  WDWN in NAD Gait: Not observed HENT: WNL, normocephalic Pulmonary: normal non-labored breathing, without Rales, rhonchi,  wheezing Cardiac: regular, without  Murmurs, rubs or gallops Abdomen:  soft, NT/ND, no masses Vascular Exam/Pulses: Left radiocephalic AVF with thrill Brisk arterial bleeding from an ulcer when the dressing removed at the wrist Musculoskeletal: no muscle wasting or atrophy  Neurologic: A&O X 3; Appropriate Affect ; SENSATION: normal; MOTOR FUNCTION:  moving all extremities equally. Speech is fluent/normal   CBC    Component Value Date/Time   WBC 5.6 05/25/2023 0235   RBC 2.65 (L) 05/25/2023 0235   HGB 8.1 (L) 05/25/2023 0235   HCT 25.1 (L) 05/25/2023 0235   PLT 193 05/25/2023 0235   MCV 94.7 05/25/2023 0235   MCH 30.6 05/25/2023 0235   MCHC 32.3 05/25/2023 0235   RDW 15.4 05/25/2023 0235   LYMPHSABS 1.2 05/25/2023 0235   MONOABS 0.8 05/25/2023 0235   EOSABS 0.3 05/25/2023 0235   BASOSABS 0.0 05/25/2023 0235    BMET    Component Value Date/Time   NA 131 (L) 05/25/2023 0235   K 4.6 05/25/2023 0235   CL 88 (L) 05/25/2023 0235   CO2 29 05/25/2023 0235   GLUCOSE 80  05/25/2023 0235   BUN 58 (H) 05/25/2023 0235   CREATININE 11.13 (H) 05/25/2023 0235   CREATININE 4.03 (H) 06/13/2014 1742   CALCIUM 8.9 05/25/2023 0235   CALCIUM 8.1 (L) 04/17/2013 1230   GFRNONAA 5 (L) 05/25/2023 0235   GFRNONAA 16 (L) 06/13/2014 1742   GFRAA 3 (L) 04/03/2020 2010   GFRAA 19 (L) 06/13/2014 1742    COAGS: Lab Results  Component Value Date   INR 1.3 (H) 03/16/2023   INR 1.2 04/02/2020   INR 1.25 04/14/2013     Non-Invasive Vascular Imaging:    N/A  ASSESSMENT/PLAN: This is a 58 y.o. male with hx ESRD and HTN that presents to the ED with bleeding from his left radiocephalic AV fistula.  States he started bleeding this morning.  Called EMS.  Had a tourniquet placed on his arm.  There is an ulcer here.  The tourniquet has been removed and he has a pressure dressing with good hemostasis.  There is brisk bleeding when I remove the dressing.  I have recommended proceeding to the OR for AV fistula revision. Discussed likely aneurysm plication with repair of the ulcerated skin.  May require catheter.  Needs labs to proceed to OR.  NPO.  Cephus Shelling, MD [facility] [place] Office: [phone]  Cephus Shelling

## 2023-09-15 NOTE — Anesthesia Preprocedure Evaluation (Signed)
Anesthesia Evaluation  Patient identified by MRN, date of birth, ID band Patient awake    Reviewed: Allergy & Precautions, H&P , NPO status , Patient's Chart, lab work & pertinent test results  History of Anesthesia Complications Negative for: history of anesthetic complications  Airway Mallampati: I  TM Distance: >3 FB Neck ROM: Full    Dental  (+) Dental Advisory Given   Pulmonary neg pulmonary ROS 11/26/2020 SARS coronavirus NEG   breath sounds clear to auscultation       Cardiovascular Exercise Tolerance: Good hypertension, (-) angina negative cardio ROS  Rhythm:Regular Rate:Normal  '14 ECHO: severe LVH with EF 45-50%, mild MR '15 stress: EF 50%, with minimal apical thinning  ECHO 10/24 1. Left ventricular ejection fraction, by estimation, is 50 to 55%. The  left ventricle has low normal function. The left ventricle has no regional  wall motion abnormalities. There is severe concentric left ventricular  hypertrophy. Left ventricular  diastolic parameters are indeterminate.   2. Right ventricular systolic function is normal. The right ventricular  size is normal. Tricuspid regurgitation signal is inadequate for assessing  PA pressure.   3. Left atrial size was severely dilated.   4. Moderate pericardial effusion. The pericardial effusion is localized  near the right atrium. There is no evidence of cardiac tamponade.   5. The mitral valve is normal in structure. Mild mitral valve  regurgitation. No evidence of mitral stenosis.   6. The aortic valve is calcified. Aortic valve regurgitation is not  visualized. Mild aortic valve stenosis. Aortic valve area, by VTI measures  2.08 cm. Aortic valve mean gradient measures 17.0 mmHg. Aortic valve Vmax  measures 2.52 m/s.   7. There is moderate dilatation of the ascending aorta, measuring 43 mm.   8. The inferior vena cava is normal in size with greater than 50%  respiratory  variability, suggesting right atrial pressure of 3 mmHg.     Neuro/Psych negative neurological ROS  negative psych ROS   GI/Hepatic negative GI ROS, Neg liver ROS,,,  Endo/Other  negative endocrine ROS    Renal/GU Dialysis and ESRFnegative Renal ROS  negative genitourinary   Musculoskeletal   Abdominal  (+) + obese  Peds  Hematology  (+) Blood dyscrasia, anemia   Anesthesia Other Findings   Reproductive/Obstetrics negative OB ROS                             Anesthesia Physical Anesthesia Plan  ASA: 3  Anesthesia Plan: General   Post-op Pain Management: Minimal or no pain anticipated   Induction: Intravenous  PONV Risk Score and Plan: 2 and Ondansetron and Propofol infusion  Airway Management Planned: Oral ETT  Additional Equipment: None  Intra-op Plan:   Post-operative Plan: Extubation in OR  Informed Consent: I have reviewed the patients History and Physical, chart, labs and discussed the procedure including the risks, benefits and alternatives for the proposed anesthesia with the patient or authorized representative who has indicated his/her understanding and acceptance.       Plan Discussed with: CRNA and Anesthesiologist  Anesthesia Plan Comments:         Anesthesia Quick Evaluation

## 2023-09-15 NOTE — ED Provider Notes (Signed)
[place] EMERGENCY DEPARTMENT AT [facility] Provider Note   CSN: [id number] Arrival date & time: 09/15/23  1122     History  Chief Complaint  Patient presents with   FISTULA BLEED    Devin Bullock is a 58 y.o. male.  Patient here with bleeding dialysis fistula in his left forearm.  He woke up this morning he was having profuse bleeding from the fistula.  He had tourniquet applied by fire department just prior to arrival.  Tourniquet is been up for about 40 minutes.  He had a pressure dressing placed to the left wrist as well and bleeding has stopped.  He states that he woke up with a bleeding around 1030.  There was a fair amount of blood at the scene.  He denies any chest pain weakness.  He had dialysis yesterday.  He was actually supposed to see vascular surgery this afternoon due to ulcer in his left wrist at his access site.  The history is provided by the patient.       Home Medications Prior to Admission medications   Medication Sig Start Date End Date Taking? Authorizing Provider  acetaminophen (TYLENOL) 325 MG tablet Take 2 tablets (650 mg total) by mouth every 6 (six) hours as needed for mild pain (or Fever >/= 101). 05/25/23   Shon Hale, MD  atorvastatin (LIPITOR) 40 MG tablet Take 1 tablet (40 mg total) by mouth daily. 05/25/23   Shon Hale, MD  camphor-menthol [facility]) lotion Apply 1 Application topically every 8 (eight) hours as needed for itching. 05/25/23   Shon Hale, MD  cinacalcet (SENSIPAR) 60 MG tablet Take 60 mg by mouth daily.    [provider]  guaiFENesin (MUCINEX) 600 MG 12 hr tablet Take 1 tablet (600 mg total) by mouth 2 (two) times daily as needed for cough or to loosen phlegm. 05/25/23   Shon Hale, MD  guaifenesin (ROBITUSSIN) 100 MG/5ML syrup Take 200 mg by mouth 3 (three) times daily as needed for cough.    [provider]  hydrocortisone (ANUSOL-HC) 25 MG suppository Place 1 suppository (25 mg total)  rectally at bedtime. 04/26/23   Esterwood, Amy S, PA-C  hydrOXYzine (ATARAX) 25 MG tablet Take 1 tablet (25 mg total) by mouth every 8 (eight) hours as needed for itching. 05/25/23   Shon Hale, MD  metoprolol tartrate (LOPRESSOR) 25 MG tablet Take 0.5 tablets (12.5 mg total) by mouth 2 (two) times daily. 07/08/23   Monge, Petra Kuba, NP  Na Sulfate-K Sulfate-Mg Sulf (SUPREP BOWEL PREP KIT) 17.5-3.13-1.6 GM/177ML SOLN Take 1 kit by mouth as directed. 04/26/23   Esterwood, Amy S, PA-C  sevelamer carbonate (RENVELA) 800 MG tablet Take 2,400-3,200 mg by mouth 2 (two) times daily. 05/17/23   [provider]      Allergies    Patient has no known allergies.    Review of Systems   Review of Systems  Physical Exam Updated Vital Signs BP 110/80 (BP Location: Right Arm)   Pulse 87   Temp 97.9 F (36.6 C) (Oral)   Resp 20   Ht 6\' 4"  (1.93 m)   Wt 136.1 kg   SpO2 97%   BMI 36.52 kg/m  Physical Exam Vitals and nursing note reviewed.  Constitutional:      General: He is not in acute distress.    Appearance: He is well-developed.  HENT:     Head: Normocephalic and atraumatic.     Nose: Nose normal.  Mouth/Throat:     Mouth: Mucous membranes are moist.  Eyes:     Extraocular Movements: Extraocular movements intact.     Conjunctiva/sclera: Conjunctivae normal.     Pupils: Pupils are equal, round, and reactive to light.  Cardiovascular:     Rate and Rhythm: Normal rate and regular rhythm.     Pulses: Normal pulses.     Heart sounds: Normal heart sounds. No murmur heard. Pulmonary:     Effort: Pulmonary effort is normal. No respiratory distress.     Breath sounds: Normal breath sounds.  Abdominal:     Palpations: Abdomen is soft.     Tenderness: There is no abdominal tenderness.  Musculoskeletal:        General: No swelling.     Cervical back: Normal range of motion and neck supple.  Skin:    General: Skin is warm and dry.     Capillary Refill: Capillary refill takes  less than 2 seconds.     Comments: Large ulcerated area over his fistula site in his left forearm there is brisk bleeding, from this area, pressure dressing applied, tourniquet taken down  Neurological:     General: No focal deficit present.     Mental Status: He is alert.  Psychiatric:        Mood and Affect: Mood normal.     ED Results / Procedures / Treatments   Labs (all labs ordered are listed, but only abnormal results are displayed) Labs Reviewed  CBC WITH DIFFERENTIAL/PLATELET  BASIC METABOLIC PANEL    EKG None  Radiology No results found.  Procedures Procedures    Medications Ordered in ED Medications  lidocaine-EPINEPHrine (XYLOCAINE W/EPI) 2 %-1:200000 (PF) injection 20 mL (has no administration in time range)    ED Course/ Medical Decision Making/ A&P                                 Medical Decision Making Amount and/or Complexity of Data Reviewed Labs: ordered.  Risk Prescription drug management.   [name] is here for bleeding from his AV fistula.  This is in his right forearm.  He has a known large ulcer to this area was actually supposed to see vascular surgery about a revision this afternoon.  Woke up around 1030 with bleeding fire department came and placed a tourniquet.  Pressure dressing to the AV fistula site.  Patient was brought here.  We took tourniquet down.  Tourniquet was up for about 35-45 minutes.  He had brisk bleeding from his AV fistula site.  I was able to control this with a pressure dressing to his left forearm.  I talked with Dr. Chestine Spore with vascular surgery who came to the ED to evaluate him.  Overall he will take him to the OR for revision and repair this afternoon.  Basic blood work has been obtained including CBC and BMP.  Patient does not have any symptoms.  He has a history of CKD hypertension.  Patient to go directly to the OR with vascular surgery team.  Disposition per surgery team.  This chart was dictated using voice  recognition software.  Despite best efforts to proofread,  errors can occur which can change the documentation meaning.         Final Clinical Impression(s) / ED Diagnoses Final diagnoses:  Complication of AV dialysis fistula, initial encounter    Rx / DC Orders ED Discharge Orders  None         Virgina Norfolk, DO 09/15/23 1206

## 2023-09-15 NOTE — Transfer of Care (Signed)
Immediate Anesthesia Transfer of Care Note  Patient: Devin Bullock  Procedure(s) Performed: LEFT ARM ARTERIOVENOUS (AV) FISTULA REVISION WITH (Left) POSSIBLE INSERTION OF DIALYSIS CATHETER THROMBECTOMY ARTERIOVENOUS FISTULA (Left: Arm Lower)  Patient Location: PACU  Anesthesia Type:General  Level of Consciousness: awake, alert , and oriented  Airway & Oxygen Therapy: Patient Spontanous Breathing and Patient connected to face mask oxygen  Post-op Assessment: Report given to RN and Post -op Vital signs reviewed and stable  Post vital signs: Reviewed and stable  Last Vitals:  Vitals Value Taken Time  BP    Temp    Pulse 64 09/15/23 1558  Resp 25 09/15/23 1558  SpO2 100 % 09/15/23 1558  Vitals shown include unfiled device data.  Last Pain:  Vitals:   09/15/23 1359  TempSrc:   PainSc: 0-No pain         Complications: No notable events documented.

## 2023-09-15 NOTE — Discharge Instructions (Addendum)
[facility]  Discharge Instructions  AV Fistula or Graft Surgery for Dialysis Access  Please refer to the following instructions for your post-procedure care. Your surgeon or physician assistant will discuss any changes with you.  Activity  You may drive the day following your surgery, if you are comfortable and no longer taking prescription pain medication. Resume full activity as the soreness in your incision resolves.  Bathing/Showering  You may shower after you go home. Keep your incision dry for 48 hours. Do not soak in a bathtub, hot tub, or swim until the incision heals completely. You may not shower if you have a hemodialysis catheter.  Incision Care  Clean your incision with mild soap and water after 48 hours. Pat the area dry with a clean towel. You do not need a bandage unless otherwise instructed. Do not apply any ointments or creams to your incision. You may have skin glue on your incision. Do not peel it off. It will come off on its own in about one week. Your arm may swell a bit after surgery. To reduce swelling use pillows to elevate your arm so it is above your heart. Your doctor will tell you if you need to lightly wrap your arm with an ACE bandage.  Take ace wrap off at dialysis tomorrow for your dialysis session.  Diet  Resume your normal diet. There are not special food restrictions following this procedure. In order to heal from your surgery, it is CRITICAL to get adequate nutrition. Your body requires vitamins, minerals, and protein. Vegetables are the best source of vitamins and minerals. Vegetables also provide the perfect balance of protein. Processed food has little nutritional value, so try to avoid this.  Medications  Resume taking all of your medications. If your incision is causing pain, you may take over-the counter pain relievers such as acetaminophen (Tylenol). If you were prescribed a stronger pain medication, please be  aware these medications can cause nausea and constipation. Prevent nausea by taking the medication with a snack or meal. Avoid constipation by drinking plenty of fluids and eating foods with high amount of fiber, such as fruits, vegetables, and grains.  Do not take Tylenol if you are taking prescription pain medications.  Follow up Your surgeon may want to see you in the office following your access surgery. If so, this will be arranged at the time of your surgery.  Please call us immediately for any of the following conditions:  Increased pain, redness, drainage (pus) from your incision site Fever of 101 degrees or higher Severe or worsening pain at your incision site Hand pain or numbness.  Reduce your risk of vascular disease:  Stop smoking. If you would like help, call [facility] at 1-800-QUIT-NOW (228-275-1237) or [place] at (409)725-1171  Manage your cholesterol Maintain a desired weight Control your diabetes Keep your blood pressure down  Dialysis  It will take several weeks to several months for your new dialysis access to be ready for use. Your surgeon will determine when it is okay to use it. Your nephrologist will continue to direct your dialysis. You can continue to use your Permcath until your new access is ready for use.   09/15/2023 Devin Bullock [id number] 08-May-1966  Surgeon(s): Cephus Shelling, MD  Procedure(s): LEFT ARM ARTERIOVENOUS (AV) FISTULA REVISION   X May stick graft on designated area only:  Do NOT stick over incision for 4 weeks, Okay to stick above and below incision now.  SEE DIAGRAM   If you have any questions, please call the office at [phone].

## 2023-09-15 NOTE — Anesthesia Procedure Notes (Signed)
Procedure Name: LMA Insertion Date/Time: 09/15/2023 2:55 PM  Performed by: April Holding, CRNAPre-anesthesia Checklist: Patient identified, Emergency Drugs available, Suction available and Patient being monitored Patient Re-evaluated:Patient Re-evaluated prior to induction Oxygen Delivery Method: Circle System Utilized Preoxygenation: Pre-oxygenation with 100% oxygen Induction Type: IV induction Ventilation: Mask ventilation without difficulty LMA: LMA inserted LMA Size: 5.0 Number of attempts: 1 Airway Equipment and Method: Bite block Placement Confirmation: positive ETCO2 Tube secured with: Tape Dental Injury: Teeth and Oropharynx as per pre-operative assessment

## 2023-09-16 ENCOUNTER — Encounter (HOSPITAL_COMMUNITY): Payer: Self-pay | Admitting: Vascular Surgery

## 2023-10-03 NOTE — Progress Notes (Unsigned)
POST OPERATIVE OFFICE NOTE    CC:  F/u for surgery  HPI:  This is a 58 y.o. male who is s/p thrombectomy of left RC AVF and revision of left RC AVF with plication and resection of overlying ulcerated skin on 09/15/2023 by Dr. Chestine Spore   Pt states he does not have pain/numbness in the left hand.  He states the fistula is working well and they are sticking above the incision.    The pt is on dialysis M/W/F at [facility] [address] location.   No Known Allergies  Current Outpatient Medications  Medication Sig Dispense Refill   acetaminophen (TYLENOL) 325 MG tablet Take 2 tablets (650 mg total) by mouth every 6 (six) hours as needed for mild pain (or Fever >/= 101). (Patient not taking: Reported on 09/15/2023)     atorvastatin (LIPITOR) 40 MG tablet Take 1 tablet (40 mg total) by mouth daily. 30 tablet 2   camphor-menthol (SARNA) lotion Apply 1 Application topically every 8 (eight) hours as needed for itching. (Patient not taking: Reported on 09/15/2023) 222 mL 0   cinacalcet (SENSIPAR) 60 MG tablet Take 60 mg by mouth daily.     guaiFENesin (MUCINEX) 600 MG 12 hr tablet Take 1 tablet (600 mg total) by mouth 2 (two) times daily as needed for cough or to loosen phlegm. 20 tablet 1   guaifenesin (ROBITUSSIN) 100 MG/5ML syrup Take 200 mg by mouth 3 (three) times daily as needed for cough.     hydrocortisone (ANUSOL-HC) 25 MG suppository Place 1 suppository (25 mg total) rectally at bedtime. (Patient not taking: Reported on 09/15/2023) 30 suppository 6   hydrOXYzine (ATARAX) 25 MG tablet Take 1 tablet (25 mg total) by mouth every 8 (eight) hours as needed for itching. (Patient not taking: Reported on 09/15/2023) 30 tablet 0   metoprolol tartrate (LOPRESSOR) 25 MG tablet Take 0.5 tablets (12.5 mg total) by mouth 2 (two) times daily. 60 tablet 11   Na Sulfate-K Sulfate-Mg Sulf (SUPREP BOWEL PREP KIT) 17.5-3.13-1.6 GM/177ML SOLN Take 1 kit by mouth as directed. (Patient not taking: Reported on 09/15/2023) 324  mL 0   oxyCODONE-acetaminophen (PERCOCET) 5-325 MG tablet Take 1 tablet by mouth every 6 (six) hours as needed for severe pain (pain score 7-10). 12 tablet 0   sevelamer carbonate (RENVELA) 800 MG tablet Take 2,400-3,200 mg by mouth 2 (two) times daily.     No current facility-administered medications for this visit.     ROS:  See HPI  Physical Exam:  Today's Vitals   10/04/23 1452  BP: 123/75  Pulse: (!) 103  Resp: 18  Temp: (!) 97.4 F (36.3 C)  TempSrc: Temporal  SpO2: 96%  Weight: (!) 307 lb 1.6 oz (139.3 kg)  Height: 6\' 4"  (1.93 m)  PainSc: 0-No pain   Body mass index is 37.38 kg/m.   Incision:  healed nicely Extremities:   There is a palpable left radial pulse.   Motor and sensory are in tact.   There is a thrill present.  Access is  easily palpable     Assessment/Plan:  This is a 59 y.o. male who is s/p: thrombectomy of left RC AVF and revision of left RC AVF with plication and resection of overlying ulcerated skin on 09/15/2023 by Dr. Chestine Spore   -the pt does not have evidence of steal. -nylon sutures were removed today -do not stick over the incision for a couple more weeks. Ok to cleanse with soap and water daily. -if pt  has tunneled catheter, this can be removed at the discretion of the dialysis center once the pt's access has been successfully cannulated to their satisfaction.  -discussed with pt that access does not last forever -the pt will follow up as needed   Doreatha Massed, [facility] [phone]  Clinic MD:  Chestine Spore

## 2023-10-04 ENCOUNTER — Ambulatory Visit: Payer: Medicaid Other | Admitting: Podiatry

## 2023-10-04 ENCOUNTER — Ambulatory Visit (INDEPENDENT_AMBULATORY_CARE_PROVIDER_SITE_OTHER): Payer: Medicaid Other | Admitting: Physician Assistant

## 2023-10-04 ENCOUNTER — Encounter: Payer: Self-pay | Admitting: Physician Assistant

## 2023-10-04 VITALS — BP 123/75 | HR 103 | Temp 97.4°F | Resp 18 | Ht 76.0 in | Wt 307.1 lb

## 2023-10-04 DIAGNOSIS — N186 End stage renal disease: Secondary | ICD-10-CM

## 2023-10-04 DIAGNOSIS — Z992 Dependence on renal dialysis: Secondary | ICD-10-CM

## 2023-10-07 ENCOUNTER — Ambulatory Visit: Payer: Medicaid Other | Attending: Nurse Practitioner | Admitting: Nurse Practitioner

## 2023-10-07 NOTE — Progress Notes (Deleted)
 Office Visit    Patient Name: Devin Bullock Date of Encounter: 10/07/2023  Primary Care Provider:  [name], MD Primary Cardiologist:  Rollene Rotunda, MD  Chief Complaint    58 year old male with a history of chronic systolic heart failure, dilated cardiomyopathy, SVT, AVNRT, GI bleed, anemia, prostate cancer, bladder cancer, ESRD on HD, and obesity who presents for hospital follow-up related to SVT.   Past Medical History    Past Medical History:  Diagnosis Date   Anemia    AV fistula (HCC)    Cancer (HCC)    prostate and bladder cancer - 2022 diagnosed   CHF (congestive heart failure) (HCC)    patient denies   Chronic kidney disease    Dialysis patient [facility])    History of blood transfusion 03/2020   Hypertension    history of, no longer taking medication at this time   Morbid obesity (HCC)    Parathyroid disorder [facility])    Past Surgical History:  Procedure Laterality Date   AV FISTULA PLACEMENT Left 05/24/2014   Procedure: BRACHIOCEPHALIC ARTERIOVENOUS (AV) FISTULA CREATION;  Surgeon: Chuck Hint, MD;  Location: [facility] OR;  Service: Vascular;  Laterality: Left;   AV FISTULA PLACEMENT Left 09/15/2023   Procedure: LEFT ARM ARTERIOVENOUS (AV) FISTULA REVISION AND PLICATION;  Surgeon: Cephus Shelling, MD;  Location: [facility] OR;  Service: Vascular;  Laterality: Left;   ESOPHAGOGASTRODUODENOSCOPY (EGD) WITH PROPOFOL N/A 04/03/2020   Procedure: ESOPHAGOGASTRODUODENOSCOPY (EGD) WITH PROPOFOL;  Surgeon: Kerin Salen, MD;  Location: [facility] ENDOSCOPY;  Service: Gastroenterology;  Laterality: N/A;   PROSTATE BIOPSY N/A 11/27/2020   Procedure: BIOPSY TRANSRECTAL ULTRASONIC PROSTATE (TUBP);  Surgeon: Crista Elliot, MD;  Location: [facility] ORS;  Service: Urology;  Laterality: N/A;  REQUESTING 13 MINS FOR CASE   REVISON OF ARTERIOVENOUS FISTULA Left 01/08/2021   Procedure: REVISON PLICATION OF ARTERIOVENOUS FISTULA LEFT;  Surgeon: Maeola Harman, MD;  Location: [facility] OR;   Service: Vascular;  Laterality: Left;   SURGICAL EXCISION OF EXCESSIVE SKIN Left 09/15/2023   Procedure: SURGICAL EXCISION OF EXCESSIVE SKIN, ULCERATIVE SKIN;  Surgeon: Cephus Shelling, MD;  Location: [facility] OR;  Service: Vascular;  Laterality: Left;   THROMBECTOMY W/ EMBOLECTOMY Left 09/15/2023   Procedure: THROMBECTOMY ARTERIOVENOUS FISTULA;  Surgeon: Cephus Shelling, MD;  Location: [facility] OR;  Service: Vascular;  Laterality: Left;   TRANSURETHRAL RESECTION OF BLADDER TUMOR N/A 11/27/2020   Procedure: TRANSURETHRAL RESECTION OF BLADDER TUMOR (TURBT);  Surgeon: Crista Elliot, MD;  Location: [facility] ORS;  Service: Urology;  Laterality: N/A;  NEED PARALYSIS    Allergies  No Known Allergies   Labs/Other Studies Reviewed    The following studies were reviewed today:  Cardiac Studies & Procedures   ______________________________________________________________________________________________     ECHOCARDIOGRAM  ECHOCARDIOGRAM COMPLETE 05/22/2023  Narrative ECHOCARDIOGRAM REPORT    Patient Name:   Devin Bullock Date of Exam: 05/22/2023 Medical Rec #:  [id number]   Height:       76.0 in Accession #:    [id number]  Weight:       306.7 lb Date of Birth:  12-21-1965   BSA:          2.656 m Patient Age:    57 years    BP:           110/89 mmHg Patient Gender: M           HR:           97 bpm. Exam  Location:  Inpatient  Procedure: 2D Echo, Cardiac Doppler, Color Doppler and Intracardiac Opacification Agent  MODIFIED REPORT: This report was modified by Thomasene Ripple DO on 05/22/2023 due to ascending aorta dimension. Indications:     CHF- Acute Systolic  History:         Patient has prior history of Echocardiogram examinations, most recent 04/13/2013. CHF and Cardiomyopathy; Risk Factors:Hypertension. Chronic Kidney Disease.  Sonographer:     Raeford Razor RDCS Referring Phys:  [id number] Gwenyth Bouillon L GARBA Diagnosing Phys: Lavona Mound Tobb DO   Sonographer Comments: Technically difficult study due  to poor echo windows and patient is obese. Image acquisition challenging due to patient body habitus. IMPRESSIONS   1. Left ventricular ejection fraction, by estimation, is 50 to 55%. The left ventricle has low normal function. The left ventricle has no regional wall motion abnormalities. There is severe concentric left ventricular hypertrophy. Left ventricular diastolic parameters are indeterminate. 2. Right ventricular systolic function is normal. The right ventricular size is normal. Tricuspid regurgitation signal is inadequate for assessing PA pressure. 3. Left atrial size was severely dilated. 4. Moderate pericardial effusion. The pericardial effusion is localized near the right atrium. There is no evidence of cardiac tamponade. 5. The mitral valve is normal in structure. Mild mitral valve regurgitation. No evidence of mitral stenosis. 6. The aortic valve is calcified. Aortic valve regurgitation is not visualized. Mild aortic valve stenosis. Aortic valve area, by VTI measures 2.08 cm. Aortic valve mean gradient measures 17.0 mmHg. Aortic valve Vmax measures 2.52 m/s. 7. There is moderate dilatation of the ascending aorta, measuring 43 mm. 8. The inferior vena cava is normal in size with greater than 50% respiratory variability, suggesting right atrial pressure of 3 mmHg.  FINDINGS Left Ventricle: Left ventricular ejection fraction, by estimation, is 50 to 55%. The left ventricle has low normal function. The left ventricle has no regional wall motion abnormalities. Definity contrast agent was given IV to delineate the left ventricular endocardial borders. The left ventricular internal cavity size was normal in size. There is severe concentric left ventricular hypertrophy. Left ventricular diastolic parameters are indeterminate.  Right Ventricle: The right ventricular size is normal. No increase in right ventricular wall thickness. Right ventricular systolic function is normal. Tricuspid  regurgitation signal is inadequate for assessing PA pressure.  Left Atrium: Left atrial size was severely dilated.  Right Atrium: Right atrial size was normal in size.  Pericardium: A moderately sized pericardial effusion is present. The pericardial effusion is localized near the right atrium. There is diastolic collapse of the right atrial wall. There is no evidence of cardiac tamponade.  Mitral Valve: The mitral valve is normal in structure. Mild mitral valve regurgitation. No evidence of mitral valve stenosis.  Tricuspid Valve: The tricuspid valve is normal in structure. Tricuspid valve regurgitation is not demonstrated. No evidence of tricuspid stenosis.  Aortic Valve: The aortic valve is calcified. Aortic valve regurgitation is not visualized. Mild aortic stenosis is present. Aortic valve mean gradient measures 17.0 mmHg. Aortic valve peak gradient measures 25.4 mmHg. Aortic valve area, by VTI measures 2.08 cm.  Pulmonic Valve: The pulmonic valve was normal in structure. Pulmonic valve regurgitation is not visualized. No evidence of pulmonic stenosis.  Aorta: There is moderate dilatation of the ascending aorta, measuring 43 mm.  Venous: The inferior vena cava is normal in size with greater than 50% respiratory variability, suggesting right atrial pressure of 3 mmHg.  IAS/Shunts: No atrial level shunt detected by color flow Doppler.   LEFT VENTRICLE  PLAX 2D LVIDd:         5.80 cm      Diastology LVIDs:         4.50 cm      LV e' medial:    5.77 cm/s LV PW:         1.60 cm      LV E/e' medial:  19.6 LV IVS:        1.50 cm      LV e' lateral:   7.07 cm/s LVOT diam:     2.50 cm      LV E/e' lateral: 16.0 LV SV:         97 LV SV Index:   37 LVOT Area:     4.91 cm  LV Volumes (MOD) LV vol d, MOD A2C: 123.0 ml LV vol d, MOD A4C: 156.0 ml LV vol s, MOD A2C: 59.9 ml LV vol s, MOD A4C: 77.7 ml LV SV MOD A2C:     63.1 ml LV SV MOD A4C:     156.0 ml LV SV MOD BP:      72.0  ml  RIGHT VENTRICLE             IVC RV Basal diam:  3.70 cm     IVC diam: 1.70 cm RV Mid diam:    2.70 cm RV S prime:     16.40 cm/s TAPSE (M-mode): 1.7 cm  LEFT ATRIUM              Index        RIGHT ATRIUM           Index LA diam:        5.90 cm  2.22 cm/m   RA Area:     21.80 cm LA Vol (A2C):   117.0 ml 44.04 ml/m  RA Volume:   62.60 ml  23.56 ml/m LA Vol (A4C):   143.0 ml 53.83 ml/m LA Biplane Vol: 131.0 ml 49.31 ml/m AORTIC VALVE AV Area (Vmax):    2.44 cm AV Area (Vmean):   2.00 cm AV Area (VTI):     2.08 cm AV Vmax:           252.00 cm/s AV Vmean:          201.000 cm/s AV VTI:            0.469 m AV Peak Grad:      25.4 mmHg AV Mean Grad:      17.0 mmHg LVOT Vmax:         125.50 cm/s LVOT Vmean:        81.700 cm/s LVOT VTI:          0.198 m LVOT/AV VTI ratio: 0.42  AORTA Ao Root diam: 3.00 cm Ao Asc diam:  4.30 cm  MITRAL VALVE MV Area (PHT): 4.77 cm     SHUNTS MV Decel Time: 159 msec     Systemic VTI:  0.20 m MV E velocity: 113.00 cm/s  Systemic Diam: 2.50 cm  Kardie Tobb DO Electronically signed by Thomasene Ripple DO Signature Date/Time: 05/22/2023/4:16:54 PM    Final (Updated)          ______________________________________________________________________________________________     Recent Labs: 05/21/2023: B Natriuretic Peptide 147.1; Magnesium 2.0; TSH 0.911 05/22/2023: ALT 16 09/15/2023: BUN 83; Creatinine, Ser 15.00; Hemoglobin 13.3; Platelets 172; Potassium 5.6; Sodium 136  Recent Lipid Panel    Component Value Date/Time   CHOL 143 05/23/2023 0232   TRIG 90  05/23/2023 0232   HDL 50 05/23/2023 0232   CHOLHDL 2.9 05/23/2023 0232   VLDL 18 05/23/2023 0232   LDLCALC 75 05/23/2023 0232    History of Present Illness    58 year old male with the above past medical history including chronic systolic heart failure, dilated cardiomyopathy, SVT, AVNRT, GI bleed, anemia, prostate cancer, bladder cancer, ESRD on HD, and obesity.   Lexiscan  Myoview in 2015 was intermediate risk with reversible apical defect consistent with probable apical thinning.  He was hospitalized in October 2024 in the setting of SVT.  He had an elevated heart rate during dialysis with associated shortness of breath.  He was given adenosine and IV metoprolol with improvement in his heart rate. Cardiology was consulted.  He was started on metoprolol tartrate 12.5 mg twice daily.  Echocardiogram showed normal EF (prior EF 45-50%). TSH was normal.  Troponin was elevated, likely secondary to demand ischemia in the setting of tachycardia. He was not felt to be a candidate for invasive angiography. It was noted that outpatient coronary CTA could be considered. It was noted that should he have recurrent arrhythmia, EP evaluation could be considered as an outpatient. Additionally, it was noted that should he have difficulty with hypotension on metoprolol, amiodarone could be considered. CT of the chest on admission showed goiter with multiple thyroid nodules. Outpatient thyroid ultrasound was recommended. He has had recent issues with hematuria, GI bleeding, and anemia. He is undergoing evaluation by GI and urology.  In this setting, he was not felt to be a candidate for aspirin.  He was last seen in the office on 07/08/2023 and was stable from a cardiac standpoint.  He denies chest pain or palpitations. He was seen in the ED on 09/15/2023 in the setting of bleeding from his dialysis fistula.  He underwent same-day left arm AV fistula revision and plication.   He presents today for follow-up.  Since his last visit he has  1. SVT/AVNRT: Occurred during dialysis, improved with adenosine and metoprolol.  Echo, labs were unremarkable.  He is tolerating low-dose metoprolol, denies any palpitations.  Continue metoprolol.   2. Hypotension: BP remains stable on low-dose metoprolol.  Continue to monitor.   3. Chronic systolic heart failure with improved EF: Prior EF 45 to 50%, most recent  echo showed normal EF. Euvolemic and well compensated on exam.  Fluid volume management per HD.   4. Elevated troponin: Likely occurred in the setting of demand ischemia in the setting of tachycardia.  Outpatient ischemic evaluation was mentioned.  Patient is asymptomatic.  Discussed with Dr. Antoine Poche, no indication for ischemic evaluation at this time.  Should he develop symptoms, could consider cardiac PET stress test.   5. Anemia/GI bleed/hematuria: Following with GI/urology.  Pending colonoscopy as below.   6. ESRD on HD: Following with nephrology.   7. Disposition: Follow-up in  Home Medications    Current Outpatient Medications  Medication Sig Dispense Refill   acetaminophen (TYLENOL) 325 MG tablet Take 2 tablets (650 mg total) by mouth every 6 (six) hours as needed for mild pain (or Fever >/= 101). (Patient not taking: Reported on 09/15/2023)     atorvastatin (LIPITOR) 40 MG tablet Take 1 tablet (40 mg total) by mouth daily. 30 tablet 2   camphor-menthol (SARNA) lotion Apply 1 Application topically every 8 (eight) hours as needed for itching. (Patient not taking: Reported on 09/15/2023) 222 mL 0   cinacalcet (SENSIPAR) 60 MG tablet Take 60 mg by mouth daily.  guaiFENesin (MUCINEX) 600 MG 12 hr tablet Take 1 tablet (600 mg total) by mouth 2 (two) times daily as needed for cough or to loosen phlegm. 20 tablet 1   guaifenesin (ROBITUSSIN) 100 MG/5ML syrup Take 200 mg by mouth 3 (three) times daily as needed for cough.     hydrocortisone (ANUSOL-HC) 25 MG suppository Place 1 suppository (25 mg total) rectally at bedtime. (Patient not taking: Reported on 09/15/2023) 30 suppository 6   hydrOXYzine (ATARAX) 25 MG tablet Take 1 tablet (25 mg total) by mouth every 8 (eight) hours as needed for itching. (Patient not taking: Reported on 09/15/2023) 30 tablet 0   metoprolol tartrate (LOPRESSOR) 25 MG tablet Take 0.5 tablets (12.5 mg total) by mouth 2 (two) times daily. 60 tablet 11   Na Sulfate-K  Sulfate-Mg Sulf (SUPREP BOWEL PREP KIT) 17.5-3.13-1.6 GM/177ML SOLN Take 1 kit by mouth as directed. (Patient not taking: Reported on 09/15/2023) 324 mL 0   oxyCODONE-acetaminophen (PERCOCET) 5-325 MG tablet Take 1 tablet by mouth every 6 (six) hours as needed for severe pain (pain score 7-10). (Patient not taking: Reported on 10/04/2023) 12 tablet 0   sevelamer carbonate (RENVELA) 800 MG tablet Take 2,400-3,200 mg by mouth 2 (two) times daily.     No current facility-administered medications for this visit.     Review of Systems    ***.  All other systems reviewed and are otherwise negative except as noted above.    Physical Exam    VS:  There were no vitals taken for this visit. , BMI There is no height or weight on file to calculate BMI.     GEN: Well nourished, well developed, in no acute distress. HEENT: normal. Neck: Supple, no JVD, carotid bruits, or masses. Cardiac: RRR, no murmurs, rubs, or gallops. No clubbing, cyanosis, edema.  Radials/DP/PT 2+ and equal bilaterally.  Respiratory:  Respirations regular and unlabored, clear to auscultation bilaterally. GI: Soft, nontender, nondistended, BS + x 4. MS: no deformity or atrophy. Skin: warm and dry, no rash. Neuro:  Strength and sensation are intact. Psych: Normal affect.  Accessory Clinical Findings    ECG personally reviewed by me today -    - no acute changes.   Lab Results  Component Value Date   WBC 4.2 09/15/2023   HGB 13.3 09/15/2023   HCT 39.0 09/15/2023   MCV 99.7 09/15/2023   PLT 172 09/15/2023   Lab Results  Component Value Date   CREATININE 15.00 (H) 09/15/2023   BUN 83 (H) 09/15/2023   NA 136 09/15/2023   K 5.6 (H) 09/15/2023   CL 98 09/15/2023   CO2 23 09/15/2023   Lab Results  Component Value Date   ALT 16 05/22/2023   AST 21 05/22/2023   ALKPHOS 78 05/22/2023   BILITOT 0.7 05/22/2023   Lab Results  Component Value Date   CHOL 143 05/23/2023   HDL 50 05/23/2023   LDLCALC 75 05/23/2023    TRIG 90 05/23/2023   CHOLHDL 2.9 05/23/2023    Lab Results  Component Value Date   HGBA1C 5.5 11/01/2013    Assessment & Plan    1.  ***  No BP recorded.  {Refresh Note OR Click here to enter BP  :1}***   Joylene Grapes, NP 10/07/2023, 6:53 AM

## 2023-10-18 ENCOUNTER — Other Ambulatory Visit: Payer: Self-pay | Admitting: Podiatry

## 2023-10-21 ENCOUNTER — Encounter: Payer: Self-pay | Admitting: Physician Assistant

## 2023-10-21 ENCOUNTER — Ambulatory Visit: Payer: Medicaid Other | Admitting: Physician Assistant

## 2023-10-21 VITALS — BP 118/82 | HR 81 | Ht 76.0 in | Wt 305.8 lb

## 2023-10-21 DIAGNOSIS — N186 End stage renal disease: Secondary | ICD-10-CM | POA: Diagnosis not present

## 2023-10-21 DIAGNOSIS — D649 Anemia, unspecified: Secondary | ICD-10-CM | POA: Diagnosis not present

## 2023-10-21 DIAGNOSIS — K921 Melena: Secondary | ICD-10-CM

## 2023-10-21 DIAGNOSIS — Z6837 Body mass index (BMI) 37.0-37.9, adult: Secondary | ICD-10-CM

## 2023-10-21 DIAGNOSIS — E669 Obesity, unspecified: Secondary | ICD-10-CM

## 2023-10-21 DIAGNOSIS — Z992 Dependence on renal dialysis: Secondary | ICD-10-CM | POA: Diagnosis not present

## 2023-10-21 DIAGNOSIS — I509 Heart failure, unspecified: Secondary | ICD-10-CM

## 2023-10-21 NOTE — Patient Instructions (Signed)
 You have been scheduled for a colonoscopy. Please follow written instructions given to you at your visit today.   If you use inhalers (even only as needed), please bring them with you on the day of your procedure.  DO NOT TAKE 7 DAYS PRIOR TO TEST- Trulicity (dulaglutide) Ozempic, Wegovy (semaglutide) Mounjaro (tirzepatide) Bydureon Bcise (exanatide extended release)  DO NOT TAKE 1 DAY PRIOR TO YOUR TEST Rybelsus (semaglutide) Adlyxin (lixisenatide) Victoza (liraglutide) Byetta (exanatide) ____________________________________________________________________________________________________________________________  If your blood pressure at your visit was 140/90 or greater, please contact your primary care physician to follow up on this.  _______________________________________________________  If you are age 49 or older, your body mass index should be between 23-30. Your Body mass index is 37.22 kg/m. If this is out of the aforementioned range listed, please consider follow up with your Primary Care Provider.  If you are age 37 or younger, your body mass index should be between 19-25. Your Body mass index is 37.22 kg/m. If this is out of the aformentioned range listed, please consider follow up with your Primary Care Provider.   ________________________________________________________  The [facility] providers would like to encourage you to use [facility] to communicate with providers for non-urgent requests or questions.  Due to long hold times on the telephone, sending your provider a message by [facility] may be a faster and more efficient way to get a response.  Please allow 48 business hours for a response.  Please remember that this is for non-urgent requests.  _______________________________________________________

## 2023-10-21 NOTE — Progress Notes (Signed)
 Chief Complaint: Hematochezia  HPI:    Mr. Devin Bullock is a 58 year old African-American male with a past medical history as listed below including ESRD on HD Monday Wednesday and Friday, obesity, CHF and multiple others, who was referred to me by [name], MD for a complaint of hematochezia.      04/26/2023 office visit with Mike Gip to discuss persistent rectal bleeding.  Family had been seen in the ER for rectal bleeding and plans for colonoscopy the patient left AMA.  At that point repeat CBC and start Anusol suppositories.  Schedule for colonoscopy at the hospital due to ESRD.    07/08/2023 patient met with cardiology for his history of chronic systolic heart failure, dilated cardiomyopathy, SVT and multiple others.  At that point they discussed that his EF had improved, prior EF 45 to 50% most recent echo which showed normal EF.  He was euvolemic on exam.  They briefly discussed his GI bleeding and recommended a colonoscopy.  Preoperative cardiac exam and cardiac risk score was 0.9.  They thought he was in good shape to have a colonoscopy.    Of note patient was previously scheduled Dr. Leonides Schanz but this procedure was canceled due to cardiac concerns.    Today, patient tells me that he tried using suppositories for a couple of weeks but he is not even sure that he uses them right and has noticed any change in bleeding, continues to bleed over the past couple of months off and on, sometimes it can last for days and seems to stream into the toilet and other times it is just a wipe on the paper.  He is tired of seeing blood in the toilet.  Denies any rectal pain or abdominal pain.  Denies constipation.    Denies fever, chills or weight loss.  Past Medical History:  Diagnosis Date   Anemia    AV fistula (HCC)    Cancer (HCC)    prostate and bladder cancer - 2022 diagnosed   CHF (congestive heart failure) (HCC)    patient denies   Chronic kidney disease    Dialysis patient [facility])     History of blood transfusion 03/2020   Hypertension    history of, no longer taking medication at this time   Morbid obesity (HCC)    Parathyroid disorder [facility])     Past Surgical History:  Procedure Laterality Date   AV FISTULA PLACEMENT Left 05/24/2014   Procedure: BRACHIOCEPHALIC ARTERIOVENOUS (AV) FISTULA CREATION;  Surgeon: Chuck Hint, MD;  Location: [facility] OR;  Service: Vascular;  Laterality: Left;   AV FISTULA PLACEMENT Left 09/15/2023   Procedure: LEFT ARM ARTERIOVENOUS (AV) FISTULA REVISION AND PLICATION;  Surgeon: Cephus Shelling, MD;  Location: [facility] OR;  Service: Vascular;  Laterality: Left;   ESOPHAGOGASTRODUODENOSCOPY (EGD) WITH PROPOFOL N/A 04/03/2020   Procedure: ESOPHAGOGASTRODUODENOSCOPY (EGD) WITH PROPOFOL;  Surgeon: Kerin Salen, MD;  Location: [facility] ENDOSCOPY;  Service: Gastroenterology;  Laterality: N/A;   PROSTATE BIOPSY N/A 11/27/2020   Procedure: BIOPSY TRANSRECTAL ULTRASONIC PROSTATE (TUBP);  Surgeon: Crista Elliot, MD;  Location: [facility] ORS;  Service: Urology;  Laterality: N/A;  REQUESTING 36 MINS FOR CASE   REVISON OF ARTERIOVENOUS FISTULA Left 01/08/2021   Procedure: REVISON PLICATION OF ARTERIOVENOUS FISTULA LEFT;  Surgeon: Maeola Harman, MD;  Location: [facility] OR;  Service: Vascular;  Laterality: Left;   SURGICAL EXCISION OF EXCESSIVE SKIN Left 09/15/2023   Procedure: SURGICAL EXCISION OF EXCESSIVE SKIN, ULCERATIVE SKIN;  Surgeon: Cephus Shelling,  MD;  Location: [facility] OR;  Service: Vascular;  Laterality: Left;   THROMBECTOMY W/ EMBOLECTOMY Left 09/15/2023   Procedure: THROMBECTOMY ARTERIOVENOUS FISTULA;  Surgeon: Cephus Shelling, MD;  Location: [facility] OR;  Service: Vascular;  Laterality: Left;   TRANSURETHRAL RESECTION OF BLADDER TUMOR N/A 11/27/2020   Procedure: TRANSURETHRAL RESECTION OF BLADDER TUMOR (TURBT);  Surgeon: Crista Elliot, MD;  Location: [facility] ORS;  Service: Urology;  Laterality: N/A;  NEED PARALYSIS    Current Outpatient Medications   Medication Sig Dispense Refill   atorvastatin (LIPITOR) 40 MG tablet Take 1 tablet (40 mg total) by mouth daily. 30 tablet 2   cinacalcet (SENSIPAR) 60 MG tablet Take 60 mg by mouth daily.     guaiFENesin (MUCINEX) 600 MG 12 hr tablet Take 1 tablet (600 mg total) by mouth 2 (two) times daily as needed for cough or to loosen phlegm. 20 tablet 1   guaifenesin (ROBITUSSIN) 100 MG/5ML syrup Take 200 mg by mouth 3 (three) times daily as needed for cough.     Methoxy PEG-Epoetin Beta (MIRCERA IJ) Mircera     metoprolol tartrate (LOPRESSOR) 25 MG tablet Take 0.5 tablets (12.5 mg total) by mouth 2 (two) times daily. 60 tablet 11   midodrine (PROAMATINE) 5 MG tablet Take 5 mg by mouth. Taking Monday, Wednesday and Friday at dialysis     sevelamer carbonate (RENVELA) 800 MG tablet Take 2,400-3,200 mg by mouth 2 (two) times daily.     No current facility-administered medications for this visit.    Allergies as of 10/21/2023   (No Known Allergies)    Family History  Problem Relation Age of Onset   Diabetes Mother    Hypertension Mother    Heart attack Mother 15   Stroke Father    Hypertension Father    Depression Father    Diabetes Brother    Hypertension Brother    Stroke Brother 29   Alcohol abuse Brother    Asthma Brother    Colon cancer Neg Hx    Esophageal cancer Neg Hx    Stomach cancer Neg Hx     Social History   Socioeconomic History   Marital status: Single    Spouse name: Not on file   Number of children: 2   Years of education: Not on file   Highest education level: Not on file  Occupational History   Occupation: disable  Tobacco Use   Smoking status: Never    Passive exposure: Never   Smokeless tobacco: Never  Vaping Use   Vaping status: Never Used  Substance and Sexual Activity   Alcohol use: No   Drug use: No   Sexual activity: Never  Other Topics Concern   Not on file  Social History Narrative   Lives alone.    Social Drivers of Research scientist (physical sciences) Strain: Not on file  Food Insecurity: Food Insecurity Present (05/21/2023)   Hunger Vital Sign    Worried About Running Out of Food in the Last Year: Often true    Ran Out of Food in the Last Year: Often true  Transportation Needs: No Transportation Needs (05/21/2023)   PRAPARE - Administrator, Civil Service (Medical): No    Lack of Transportation (Non-Medical): No  Physical Activity: Not on file  Stress: Not on file  Social Connections: Not on file  Intimate Partner Violence: Not At Risk (05/21/2023)   Humiliation, Afraid, Rape, and Kick questionnaire    Fear of Current  or Ex-Partner: No    Emotionally Abused: No    Physically Abused: No    Sexually Abused: No    Review of Systems:    Constitutional: No weight loss, fever or chills Cardiovascular: No chest pain Respiratory: No SOB Gastrointestinal: See HPI and otherwise negative   Physical Exam:  Vital signs: BP 118/82 (BP Location: Left Arm, Patient Position: Sitting, Cuff Size: Large)   Pulse 81   Ht 6\' 4"  (1.93 m)   Wt (!) 305 lb 12.8 oz (138.7 kg)   BMI 37.22 kg/m   Constitutional:   Pleasant Obese AA male appears to be in NAD, Well developed, Well nourished, alert and cooperative Respiratory: Respirations even and unlabored. Lungs clear to auscultation bilaterally.   No wheezes, crackles, or rhonchi.  Cardiovascular: Normal S1, S2. No MRG. Regular rate and rhythm. No peripheral edema, cyanosis or pallor.  Gastrointestinal:  Soft, nondistended, nontender. No rebound or guarding. Normal bowel sounds. No appreciable masses or hepatomegaly. Rectal:  Not performed.  Psychiatric: Demonstrates good judgement and reason without abnormal affect or behaviors.  RELEVANT LABS AND IMAGING: CBC    Component Value Date/Time   WBC 4.2 09/15/2023 1347   RBC 3.72 (L) 09/15/2023 1347   HGB 13.3 09/15/2023 1401   HCT 39.0 09/15/2023 1401   PLT 172 09/15/2023 1347   MCV 99.7 09/15/2023 1347   MCH 32.0 09/15/2023  1347   MCHC 32.1 09/15/2023 1347   RDW 15.9 (H) 09/15/2023 1347   LYMPHSABS 0.9 09/15/2023 1347   MONOABS 0.6 09/15/2023 1347   EOSABS 0.1 09/15/2023 1347   BASOSABS 0.0 09/15/2023 1347    CMP     Component Value Date/Time   NA 136 09/15/2023 1401   K 5.6 (H) 09/15/2023 1401   CL 98 09/15/2023 1401   CO2 23 09/15/2023 1347   GLUCOSE 97 09/15/2023 1401   BUN 83 (H) 09/15/2023 1401   CREATININE 15.00 (H) 09/15/2023 1401   CREATININE 4.03 (H) 06/13/2014 1742   CALCIUM 9.0 09/15/2023 1347   CALCIUM 8.1 (L) 04/17/2013 1230   PROT 6.9 05/22/2023 0030   ALBUMIN 3.1 (L) 05/22/2023 0030   AST 21 05/22/2023 0030   ALT 16 05/22/2023 0030   ALKPHOS 78 05/22/2023 0030   BILITOT 0.7 05/22/2023 0030   GFRNONAA 4 (L) 09/15/2023 1347   GFRNONAA 16 (L) 06/13/2014 1742   GFRAA 3 (L) 04/03/2020 2010   GFRAA 19 (L) 06/13/2014 1742    Assessment: 1.  Hematochezia: Continues per patient over the past 3 to 4 months, actually appears since at least September of last year when he saw Mike Gip, no help from suppositories, though he is not sure use them correctly, no prior colonoscopy; consider AVMs +/- hemorrhoids versus other 2.  ESRD on HD Monday Wednesday Friday 3.  Anemia-chronic-normocytic 4.  Obesity 5.  CHF  Plan: 1.  Scheduled patient for a diagnostic colonoscopy at the hospital given ESRD on HD.  We tried to schedule with Dr. Leonides Schanz but she did not have a sooner appointment so this was scheduled with Dr. Leone Payor.  He will continue to follow Dr. Leonides Schanz as his primary GI physician.  Did provide the patient a detailed list of risks for the procedure and he agrees to proceed. 2.  Patient asked about retrying suppositories.  At this point we will just wait until after colonoscopy to see what is found. 3.  Would also recent cardiac clearance given that it has been almost 4 months since his last cardiology  appointment. 4.  Patient to follow clinic per recommendations after time of  procedure.  Hyacinth Meeker, PA-C [facility] 10/21/2023, 9:04 AM  Cc: [name], MD

## 2023-10-30 NOTE — Progress Notes (Signed)
 I agree with the assessment and plan as outlined by Ms. Lemmon.

## 2023-10-31 ENCOUNTER — Telehealth: Payer: Self-pay | Admitting: *Deleted

## 2023-10-31 NOTE — Telephone Encounter (Signed)
 Pt scheduled tele preop appt 11/15/23. Med rec and consent are done.      Patient Consent for Virtual Visit        Devin Bullock has provided verbal consent on 10/31/2023 for a virtual visit (video or telephone).   CONSENT FOR VIRTUAL VISIT FOR:  Devin Bullock  By participating in this virtual visit I agree to the following:  I hereby voluntarily request, consent and authorize [facility] and its employed or contracted physicians, physician assistants, nurse practitioners or other licensed health care professionals (the Practitioner), to provide me with telemedicine health care services (the "Services") as deemed necessary by the treating Practitioner. I acknowledge and consent to receive the Services by the Practitioner via telemedicine. I understand that the telemedicine visit will involve communicating with the Practitioner through live audiovisual communication technology and the disclosure of certain medical information by electronic transmission. I acknowledge that I have been given the opportunity to request an in-person assessment or other available alternative prior to the telemedicine visit and am voluntarily participating in the telemedicine visit.  I understand that I have the right to withhold or withdraw my consent to the use of telemedicine in the course of my care at any time, without affecting my right to future care or treatment, and that the Practitioner or I may terminate the telemedicine visit at any time. I understand that I have the right to inspect all information obtained and/or recorded in the course of the telemedicine visit and may receive copies of available information for a reasonable fee.  I understand that some of the potential risks of receiving the Services via telemedicine include:  Delay or interruption in medical evaluation due to technological equipment failure or disruption; Information transmitted may not be sufficient (e.g. poor resolution of images) to  allow for appropriate medical decision making by the Practitioner; and/or  In rare instances, security protocols could fail, causing a breach of personal health information.  Furthermore, I acknowledge that it is my responsibility to provide information about my medical history, conditions and care that is complete and accurate to the best of my ability. I acknowledge that Practitioner's advice, recommendations, and/or decision may be based on factors not within their control, such as incomplete or inaccurate data provided by me or distortions of diagnostic images or specimens that may result from electronic transmissions. I understand that the practice of medicine is not an exact science and that Practitioner makes no warranties or guarantees regarding treatment outcomes. I acknowledge that a copy of this consent can be made available to me via my patient portal [facility] MyChart), or I can request a printed copy by calling the office of [facility].    I understand that my insurance will be billed for this visit.   I have read or had this consent read to me. I understand the contents of this consent, which adequately explains the benefits and risks of the Services being provided via telemedicine.  I have been provided ample opportunity to ask questions regarding this consent and the Services and have had my questions answered to my satisfaction. I give my informed consent for the services to be provided through the use of telemedicine in my medical care

## 2023-10-31 NOTE — Telephone Encounter (Signed)
   Name: ROLAN WRIGHTSMAN  DOB: 1965-11-06  MRN: [id number]  Primary Cardiologist: Rollene Rotunda, MD   Preoperative team, please contact this patient and set up a phone call appointment for further preoperative risk assessment. Please obtain consent and complete medication review. Thank you for your help.  I confirm that guidance regarding antiplatelet and oral anticoagulation therapy has been completed and, if necessary, noted below.  None requested   I also confirmed the patient resides in the state of [place]. As per [facility] Medical Board telemedicine laws, the patient must reside in the state in which the provider is licensed.   Ronney Asters, NP 10/31/2023, 12:35 PM [facility]

## 2023-10-31 NOTE — Telephone Encounter (Signed)
 [facility] Pre-operative Risk Assessment     Request for surgical clearance:     Endoscopy Procedure  What type of surgery is being performed?     colonscopy  When is this surgery scheduled?     11/24/23  What type of clearance is required ?   Cardiac  Practice name and name of physician performing surgery?      [facility]  What is your office phone and fax number?      Phone- [phone]  Fax- [phone]  Anesthesia type (None, local, MAC, general) ?       MAC   Please route your response to [employer]

## 2023-10-31 NOTE — Telephone Encounter (Signed)
 Pt scheduled tele preop appt 11/15/23. Med rec and consent are done.

## 2023-11-13 ENCOUNTER — Other Ambulatory Visit: Payer: Self-pay | Admitting: Podiatry

## 2023-11-15 ENCOUNTER — Ambulatory Visit: Attending: Physician Assistant

## 2023-11-15 DIAGNOSIS — Z01810 Encounter for preprocedural cardiovascular examination: Secondary | ICD-10-CM

## 2023-11-15 NOTE — Progress Notes (Signed)
 Virtual Visit via Telephone Note   Because of Devin Bullock co-morbid illnesses, he is at least at moderate risk for complications without adequate follow up.  This format is felt to be most appropriate for this patient at this time.  Due to technical limitations with video connection Web designer), today's appointment will be conducted as an audio only telehealth visit, and Devin Bullock verbally agreed to proceed in this manner.   All issues noted in this document were discussed and addressed.  No physical exam could be performed with this format.  Evaluation Performed:  Preoperative cardiovascular risk assessment _____________   Date:  11/15/2023   Patient ID:  Devin Bullock, DOB 1966/03/22, MRN [id number] Patient Location:  Home Provider location:   Office  Primary Care Provider:  [name], MD Primary Cardiologist:  Rollene Rotunda, MD  Chief Complaint / Patient Profile   58 y.o. y/o male with a h/o chronic systolic heart failure, dilated cardiomyopathy, SVT, AVNRT, GI bleed, prostate cancer, bladder cancer, ESRD on HD, obesity and anemia who is pending endoscopy and presents today for telephonic preoperative cardiovascular risk assessment.  History of Present Illness    Devin Bullock is a 58 y.o. male who presents via audio/video conferencing for a telehealth visit today.  Pt was last seen in cardiology clinic on 07/08/2023 by Bernadene Person, NP.  At that time Devin Bullock was doing well .  The patient is now pending procedure as outlined above. Since his last visit, he tells me that he feels good without any issues.  No racing heartbeats.  No shortness of breath or chest pain.  He does have some decreased exercise tolerance so is not doing much at the moment.  However, he does surpass 4 METS on the DASI.   No medications indicated as needing held.   Past Medical History    Past Medical History:  Diagnosis Date   Anemia    AV fistula (HCC)    Cancer (HCC)    prostate and bladder  cancer - 2022 diagnosed   CHF (congestive heart failure) (HCC)    patient denies   Chronic kidney disease    Dialysis patient [facility])    History of blood transfusion 03/2020   Hypertension    history of, no longer taking medication at this time   Morbid obesity (HCC)    Parathyroid disorder [facility])    Past Surgical History:  Procedure Laterality Date   AV FISTULA PLACEMENT Left 05/24/2014   Procedure: BRACHIOCEPHALIC ARTERIOVENOUS (AV) FISTULA CREATION;  Surgeon: Chuck Hint, MD;  Location: [facility] OR;  Service: Vascular;  Laterality: Left;   AV FISTULA PLACEMENT Left 09/15/2023   Procedure: LEFT ARM ARTERIOVENOUS (AV) FISTULA REVISION AND PLICATION;  Surgeon: Cephus Shelling, MD;  Location: [facility] OR;  Service: Vascular;  Laterality: Left;   ESOPHAGOGASTRODUODENOSCOPY (EGD) WITH PROPOFOL N/A 04/03/2020   Procedure: ESOPHAGOGASTRODUODENOSCOPY (EGD) WITH PROPOFOL;  Surgeon: Kerin Salen, MD;  Location: [facility] ENDOSCOPY;  Service: Gastroenterology;  Laterality: N/A;   PROSTATE BIOPSY N/A 11/27/2020   Procedure: BIOPSY TRANSRECTAL ULTRASONIC PROSTATE (TUBP);  Surgeon: Crista Elliot, MD;  Location: [facility] ORS;  Service: Urology;  Laterality: N/A;  REQUESTING 22 MINS FOR CASE   REVISON OF ARTERIOVENOUS FISTULA Left 01/08/2021   Procedure: REVISON PLICATION OF ARTERIOVENOUS FISTULA LEFT;  Surgeon: Maeola Harman, MD;  Location: [facility] OR;  Service: Vascular;  Laterality: Left;   SURGICAL EXCISION OF EXCESSIVE SKIN Left 09/15/2023   Procedure: SURGICAL EXCISION OF  EXCESSIVE SKIN, ULCERATIVE SKIN;  Surgeon: Cephus Shelling, MD;  Location: [facility] OR;  Service: Vascular;  Laterality: Left;   THROMBECTOMY W/ EMBOLECTOMY Left 09/15/2023   Procedure: THROMBECTOMY ARTERIOVENOUS FISTULA;  Surgeon: Cephus Shelling, MD;  Location: [facility] OR;  Service: Vascular;  Laterality: Left;   TRANSURETHRAL RESECTION OF BLADDER TUMOR N/A 11/27/2020   Procedure: TRANSURETHRAL RESECTION OF BLADDER TUMOR (TURBT);   Surgeon: Crista Elliot, MD;  Location: [facility] ORS;  Service: Urology;  Laterality: N/A;  NEED PARALYSIS    Allergies  No Known Allergies  Home Medications    Prior to Admission medications   Medication Sig Start Date End Date Taking? Authorizing Provider  atorvastatin (LIPITOR) 40 MG tablet Take 1 tablet (40 mg total) by mouth daily. 05/25/23   Shon Hale, MD  cinacalcet (SENSIPAR) 60 MG tablet Take 60 mg by mouth daily.    [provider]  guaiFENesin (MUCINEX) 600 MG 12 hr tablet Take 1 tablet (600 mg total) by mouth 2 (two) times daily as needed for cough or to loosen phlegm. 05/25/23   Shon Hale, MD  guaifenesin (ROBITUSSIN) 100 MG/5ML syrup Take 200 mg by mouth 3 (three) times daily as needed for cough.    [provider]  Methoxy PEG-Epoetin Beta (MIRCERA IJ) Mircera 10/17/23 10/15/24  [provider]  metoprolol tartrate (LOPRESSOR) 25 MG tablet Take 0.5 tablets (12.5 mg total) by mouth 2 (two) times daily. 07/08/23   Joylene Grapes, NP  midodrine (PROAMATINE) 5 MG tablet Take 5 mg by mouth. Taking Monday, Wednesday and Friday at dialysis 10/07/23   [provider]  sevelamer carbonate (RENVELA) 800 MG tablet Take 2,400-3,200 mg by mouth 2 (two) times daily. 05/17/23   [provider]  terbinafine (LAMISIL) 250 MG tablet TAKE 1 TABLET BY MOUTH EVERY DAY 11/14/23   Ernestene Kiel T, [place]    Physical Exam    Vital Signs:  Devin Bullock does not have vital signs available for review today.  Given telephonic nature of communication, physical exam is limited. AAOx3. NAD. Normal affect.  Speech and respirations are unlabored.  Accessory Clinical Findings    None  Assessment & Plan    1.  Preoperative Cardiovascular Risk Assessment:  Devin Bullock perioperative risk of a major cardiac event is 6.6% according to the Revised Cardiac Risk Index (RCRI).  Therefore, he is at high risk for perioperative complications.   His functional capacity is  fair at 4.4 METs according to the [facility] Activity Status Index (DASI). Recommendations: According to ACC/AHA guidelines, no further cardiovascular testing needed.  The patient may proceed to surgery at acceptable risk.    The patient was advised that if he develops new symptoms prior to surgery to contact our office to arrange for a follow-up visit, and he verbalized understanding.   A copy of this note will be routed to requesting surgeon.  Time:   Today, I have spent 5 minutes with the patient with telehealth technology discussing medical history, symptoms, and management plan.     Sharlene Dory, PA-C  11/15/2023, 2:10 PM

## 2023-11-16 ENCOUNTER — Encounter (HOSPITAL_COMMUNITY): Payer: Self-pay | Admitting: Internal Medicine

## 2023-11-16 ENCOUNTER — Telehealth: Payer: Self-pay | Admitting: Gastroenterology

## 2023-11-16 NOTE — Telephone Encounter (Signed)
 Procedure:Colonoscopy Procedure date: 11/24/23 Procedure location: [facility] Arrival Time: 6:45 am Spoke with the patient Y/N: Yes Any prep concerns? No  Has the patient obtained the prep from the pharmacy ? Yes Do you have a care partner and transportation: Yes Any additional concerns? No

## 2023-11-21 ENCOUNTER — Telehealth: Payer: Self-pay | Admitting: Internal Medicine

## 2023-11-21 NOTE — Telephone Encounter (Signed)
 Patent called to reschedule his hospital procedure 11/25/23.

## 2023-11-22 MED ORDER — NA SULFATE-K SULFATE-MG SULF 17.5-3.13-1.6 GM/177ML PO SOLN
1.0000 | Freq: Once | ORAL | 0 refills | Status: AC
Start: 2023-11-22 — End: 2023-11-22

## 2023-11-22 NOTE — Telephone Encounter (Signed)
 Spoke with patient and he states he needs to reschedule colonoscopy. Rescheduled procedure for 02/02/24 @ 7:30 am. New instructions sent to patient via Mychart.

## 2023-12-13 ENCOUNTER — Emergency Department (HOSPITAL_BASED_OUTPATIENT_CLINIC_OR_DEPARTMENT_OTHER)

## 2023-12-13 ENCOUNTER — Other Ambulatory Visit: Payer: Self-pay

## 2023-12-13 ENCOUNTER — Emergency Department (HOSPITAL_BASED_OUTPATIENT_CLINIC_OR_DEPARTMENT_OTHER)
Admission: EM | Admit: 2023-12-13 | Discharge: 2023-12-13 | Disposition: A | Discharge status: Home / Self Care | Attending: Emergency Medicine | Admitting: Emergency Medicine

## 2023-12-13 ENCOUNTER — Encounter (HOSPITAL_BASED_OUTPATIENT_CLINIC_OR_DEPARTMENT_OTHER): Payer: Self-pay | Admitting: Emergency Medicine

## 2023-12-13 DIAGNOSIS — K649 Unspecified hemorrhoids: Secondary | ICD-10-CM

## 2023-12-13 DIAGNOSIS — N186 End stage renal disease: Secondary | ICD-10-CM | POA: Diagnosis not present

## 2023-12-13 DIAGNOSIS — I509 Heart failure, unspecified: Secondary | ICD-10-CM | POA: Insufficient documentation

## 2023-12-13 DIAGNOSIS — Z992 Dependence on renal dialysis: Secondary | ICD-10-CM | POA: Diagnosis not present

## 2023-12-13 DIAGNOSIS — Z79899 Other long term (current) drug therapy: Secondary | ICD-10-CM | POA: Diagnosis not present

## 2023-12-13 DIAGNOSIS — I132 Hypertensive heart and chronic kidney disease with heart failure and with stage 5 chronic kidney disease, or end stage renal disease: Secondary | ICD-10-CM | POA: Diagnosis not present

## 2023-12-13 DIAGNOSIS — K644 Residual hemorrhoidal skin tags: Secondary | ICD-10-CM | POA: Diagnosis not present

## 2023-12-13 DIAGNOSIS — R310 Gross hematuria: Secondary | ICD-10-CM | POA: Insufficient documentation

## 2023-12-13 DIAGNOSIS — E875 Hyperkalemia: Secondary | ICD-10-CM | POA: Diagnosis not present

## 2023-12-13 DIAGNOSIS — K648 Other hemorrhoids: Secondary | ICD-10-CM | POA: Insufficient documentation

## 2023-12-13 DIAGNOSIS — D631 Anemia in chronic kidney disease: Secondary | ICD-10-CM | POA: Diagnosis not present

## 2023-12-13 DIAGNOSIS — R319 Hematuria, unspecified: Secondary | ICD-10-CM | POA: Diagnosis present

## 2023-12-13 LAB — URINALYSIS, W/ REFLEX TO CULTURE (INFECTION SUSPECTED)
Bacteria, UA: NONE SEEN
RBC / HPF: >50 RBC/hpf (ref 0–5)

## 2023-12-13 LAB — CBC WITH DIFFERENTIAL/PLATELET
Abs Immature Granulocytes: 0.01 10*3/uL (ref 0.00–0.07)
Basophils Absolute: 0 10*3/uL (ref 0.0–0.1)
Basophils Relative: 1 %
Eosinophils Absolute: 0.2 10*3/uL (ref 0.0–0.5)
Eosinophils Relative: 3 %
HCT: 34.3 % — ABNORMAL LOW (ref 39.0–52.0)
Hemoglobin: 11.2 g/dL — ABNORMAL LOW (ref 13.0–17.0)
Immature Granulocytes: 0 %
Lymphocytes Relative: 18 %
Lymphs Abs: 1 10*3/uL (ref 0.7–4.0)
MCH: 31.7 pg (ref 26.0–34.0)
MCHC: 32.7 g/dL (ref 30.0–36.0)
MCV: 97.2 fL (ref 80.0–100.0)
Monocytes Absolute: 0.6 10*3/uL (ref 0.1–1.0)
Monocytes Relative: 12 %
Neutro Abs: 3.5 10*3/uL (ref 1.7–7.7)
Neutrophils Relative %: 66 %
Platelets: 158 10*3/uL (ref 150–400)
RBC: 3.53 MIL/uL — ABNORMAL LOW (ref 4.22–5.81)
RDW: 17.4 % — ABNORMAL HIGH (ref 11.5–15.5)
WBC: 5.2 10*3/uL (ref 4.0–10.5)
nRBC: 0 % (ref 0.0–0.2)

## 2023-12-13 LAB — COMPREHENSIVE METABOLIC PANEL WITH GFR
ALT: 15 U/L (ref 0–44)
AST: 25 U/L (ref 15–41)
Albumin: 4.7 g/dL (ref 3.5–5.0)
Alkaline Phosphatase: 131 U/L — ABNORMAL HIGH (ref 38–126)
Anion gap: 20 — ABNORMAL HIGH (ref 5–15)
BUN: 84 mg/dL — ABNORMAL HIGH (ref 6–20)
CO2: 26 mmol/L (ref 22–32)
Calcium: 10.2 mg/dL (ref 8.9–10.3)
Chloride: 91 mmol/L — ABNORMAL LOW (ref 98–111)
Creatinine, Ser: 15.9 mg/dL — ABNORMAL HIGH (ref 0.61–1.24)
GFR, Estimated: 3 mL/min — ABNORMAL LOW (ref >60)
Glucose, Bld: 92 mg/dL (ref 70–99)
Potassium: 5.3 mmol/L — ABNORMAL HIGH (ref 3.5–5.1)
Sodium: 137 mmol/L (ref 135–145)
Total Bilirubin: 0.3 mg/dL (ref 0.0–1.2)
Total Protein: 7.6 g/dL (ref 6.5–8.1)

## 2023-12-13 MED ORDER — IOHEXOL 300 MG/ML  SOLN
100.0000 mL | Freq: Once | INTRAMUSCULAR | Status: AC | PRN
  Administered 2023-12-13: 100 mL via INTRAVENOUS

## 2023-12-13 MED ORDER — CEFPODOXIME PROXETIL 200 MG PO TABS: 200.0000 mg | ORAL_TABLET | Freq: Every day | ORAL | 0 refills | Status: AC

## 2023-12-13 NOTE — ED Provider Notes (Signed)
 [place] EMERGENCY DEPARTMENT AT [facility] Provider Note   CSN: [id number] Arrival date & time: 12/13/23  1359     History  Chief Complaint  Patient presents with   Hematuria    Devin Bullock is a 58 y.o. male with PMHx anemia, CHF, ESRD on dialysis MWF, HTN, who presents to ED concerned for hematuria and BRBPR. Patient does not make urine - but states that he has been suffering with hematuria for awhile which is d/t bladder cancer and patient does follow with Urologist Dr. Nolon Baxter for this complaint. Patient stating that he has been having intermittent pelvic discomfort that resolves after urinating blood for a couple of days. Patient attempted to reach out to his urologist who happens to be out of town. Patient stating that he had a cystoscopy on 4/10. Patient stating that he was sent to ED so that he could have his bladder flushed out and urine culture obtained.   Patient also concerned for BRBPR since yesterday. Patient endorsing hx of hemorrhoids and states that he ate some bad food 2 days ago and has been having frequent BM's since. Patient stating that this has been a recurring problem and has a colonoscopy scheduled for June 13th.   Patient's last dialysis session was Friday - he did not go yesterday d/t his symptoms.   Denies fever, chest pain, dyspnea, nausea, vomiting, diarrhea. Patient is not on blood thinners.   Hematuria       Home Medications Prior to Admission medications   Medication Sig Start Date End Date Taking? Authorizing Provider  cefpodoxime (VANTIN) 200 MG tablet Take 1 tablet (200 mg total) by mouth daily for 7 days. 12/13/23 12/20/23 Yes Aragorn Recker F, PA-C  atorvastatin  (LIPITOR) 40 MG tablet Take 1 tablet (40 mg total) by mouth daily. 05/25/23   Colin Dawley, MD  cinacalcet  (SENSIPAR ) 60 MG tablet Take 60 mg by mouth daily.    [provider]  guaiFENesin  (MUCINEX ) 600 MG 12 hr tablet Take 1 tablet (600 mg total) by mouth 2  (two) times daily as needed for cough or to loosen phlegm. 05/25/23   Colin Dawley, MD  guaifenesin  (ROBITUSSIN) 100 MG/5ML syrup Take 200 mg by mouth 3 (three) times daily as needed for cough.    [provider]  Methoxy PEG-Epoetin  Beta (MIRCERA IJ) Mircera 10/17/23 10/15/24  [provider]  metoprolol  tartrate (LOPRESSOR ) 25 MG tablet Take 0.5 tablets (12.5 mg total) by mouth 2 (two) times daily. 07/08/23   Jude Norton, NP  midodrine (PROAMATINE) 5 MG tablet Take 5 mg by mouth. Taking Monday, Wednesday and Friday at dialysis 10/07/23   [provider]  sevelamer  carbonate (RENVELA ) 800 MG tablet Take 2,400-3,200 mg by mouth 2 (two) times daily. 05/17/23   [provider]  terbinafine  (LAMISIL ) 250 MG tablet TAKE 1 TABLET BY MOUTH EVERY DAY 11/14/23   Hyatt, Max T, DPM      Allergies    Patient has no known allergies.    Review of Systems   Review of Systems  Genitourinary:  Positive for hematuria.    Physical Exam Updated Vital Signs BP (!) 137/118 (BP Location: Right Arm)   Pulse 89   Temp 98.1 F (36.7 C) (Oral)   Resp 20   SpO2 99%  Physical Exam Vitals and nursing note reviewed.  Constitutional:      General: He is not in acute distress.    Appearance: He is not ill-appearing or toxic-appearing.  HENT:  Head: Normocephalic and atraumatic.     Mouth/Throat:     Mouth: Mucous membranes are moist.  Eyes:     General: No scleral icterus.       Right eye: No discharge.        Left eye: No discharge.     Conjunctiva/sclera: Conjunctivae normal.  Cardiovascular:     Rate and Rhythm: Normal rate and regular rhythm.     Pulses: Normal pulses.     Heart sounds: Normal heart sounds. No murmur heard. Pulmonary:     Effort: Pulmonary effort is normal. No respiratory distress.     Breath sounds: Normal breath sounds. No wheezing, rhonchi or rales.  Abdominal:     General: Abdomen is flat. Bowel sounds are normal. There is no distension.      Palpations: Abdomen is soft. There is no mass.     Tenderness: There is no abdominal tenderness.  Genitourinary:    Comments: DRE with RN Josh present. External hemorrhoids appreciated. Rectal exam with bright red blood and internal hemorrhoids palpated. No fissures appreciated. Musculoskeletal:     Right lower leg: No edema.     Left lower leg: No edema.  Skin:    General: Skin is warm and dry.     Findings: No rash.  Neurological:     General: No focal deficit present.     Mental Status: He is alert. Mental status is at baseline.  Psychiatric:        Mood and Affect: Mood normal.     ED Results / Procedures / Treatments   Labs (all labs ordered are listed, but only abnormal results are displayed) Labs Reviewed  URINALYSIS, W/ REFLEX TO CULTURE (INFECTION SUSPECTED) - Abnormal; Notable for the following components:      Result Value   Color, Urine RED (*)    APPearance TURBID (*)    Glucose, UA   (*)    Value: TEST NOT REPORTED DUE TO COLOR INTERFERENCE OF URINE PIGMENT   Hgb urine dipstick   (*)    Value: TEST NOT REPORTED DUE TO COLOR INTERFERENCE OF URINE PIGMENT   Bilirubin Urine   (*)    Value: TEST NOT REPORTED DUE TO COLOR INTERFERENCE OF URINE PIGMENT   Ketones, ur   (*)    Value: TEST NOT REPORTED DUE TO COLOR INTERFERENCE OF URINE PIGMENT   Protein, ur   (*)    Value: TEST NOT REPORTED DUE TO COLOR INTERFERENCE OF URINE PIGMENT   Nitrite   (*)    Value: TEST NOT REPORTED DUE TO COLOR INTERFERENCE OF URINE PIGMENT   Leukocytes,Ua   (*)    Value: TEST NOT REPORTED DUE TO COLOR INTERFERENCE OF URINE PIGMENT   All other components within normal limits  CBC WITH DIFFERENTIAL/PLATELET - Abnormal; Notable for the following components:   RBC 3.53 (*)    Hemoglobin 11.2 (*)    HCT 34.3 (*)    RDW 17.4 (*)    All other components within normal limits  COMPREHENSIVE METABOLIC PANEL WITH GFR - Abnormal; Notable for the following components:   Potassium 5.3 (*)     Chloride 91 (*)    BUN 84 (*)    Creatinine, Ser 15.90 (*)    Alkaline Phosphatase 131 (*)    GFR, Estimated 3 (*)    Anion gap 20 (*)    All other components within normal limits    EKG None  Radiology CT ABDOMEN PELVIS W CONTRAST Result Date:  12/13/2023 CLINICAL DATA:  Gross hematuria. EXAM: CT ABDOMEN AND PELVIS WITH CONTRAST TECHNIQUE: Multidetector CT imaging of the abdomen and pelvis was performed using the standard protocol following bolus administration of intravenous contrast. RADIATION DOSE REDUCTION: This exam was performed according to the departmental dose-optimization program which includes automated exposure control, adjustment of the mA and/or kV according to patient size and/or use of iterative reconstruction technique. CONTRAST:  OMNIPAQUE  IOHEXOL  300 MG/ML  SOLN COMPARISON:  March 16, 2023. FINDINGS: Lower chest: No acute abnormality. Hepatobiliary: No cholelithiasis or biliary dilatation. Stable hepatic cysts. Pancreas: Unremarkable. No pancreatic ductal dilatation or surrounding inflammatory changes. Spleen: Normal in size without focal abnormality. Adrenals/Urinary Tract: Adrenal glands appear normal. Innumerable bilateral renal cysts are again noted with associated bilateral renal atrophy. Moderate right hydroureteronephrosis is noted without obstructing calculus. Urinary bladder is decompressed. Stomach/Bowel: Stomach is within normal limits. Appendix appears normal. No evidence of bowel wall thickening, distention, or inflammatory changes. Vascular/Lymphatic: Aortic atherosclerosis. No enlarged abdominal or pelvic lymph nodes. Reproductive: Prostate is unremarkable. Other: Small fat containing periumbilical hernia. No ascites is noted. Musculoskeletal: No acute or significant osseous findings. IMPRESSION: Bilateral renal atrophy is again noted within numeral bilateral renal cysts consistent with end-stage renal disease. Continued presence of moderate right  hydroureteronephrosis without obstructing calculus. Urinary bladder is decompressed. Aortic Atherosclerosis (ICD10-I70.0). Electronically Signed   By: Rosalene Colon M.D.   On: 12/13/2023 16:54    Procedures Procedures    Medications Ordered in ED Medications  iohexol  (OMNIPAQUE ) 300 MG/ML solution 100 mL (100 mLs Intravenous Contrast Given 12/13/23 1619)    ED Course/ Medical Decision Making/ A&P                                 Medical Decision Making Amount and/or Complexity of Data Reviewed Labs: ordered. Radiology: ordered.  Risk Prescription drug management.   This patient presents to the ED for concern of hematuria and BRBPR, this involves an extensive number of treatment options, and is a complaint that carries with it a high risk of complications and morbidity.  The differential diagnosis includes UTI, bladder cancer, hemorrhage. GI bleed. hemorrhoids   Co morbidities that complicate the patient evaluation  anemia, CHF, ESRD on dialysis MWF, HTN   Additional history obtained:  Additional history obtained from 11/2023 Urology note: Mr. Seroka is a 58 year old gentleman with bladder cancer and prostate cancer as noted below. He comes in for office cystoscopy. Below are details of his cancer care, and he is well-informed. His PSA was less than 0.01 on 12/21/22. He notes he puts out very little urine but he does have a small amount of blood in the urine when he does urinate. His last cystoscopy was without recurrence on 04/14/2023.     Problem List / ED Course / Critical interventions / Medication management  Patient presents to ED concern for hematuria that is been progressively getting worse x 2 days.  Patient with a history of hematuria and does have bladder cancer and does follow closely with outpatient urology.  Patient is not currently on chemo or radiation.  Patient stating that he feels discomfort building in his bladder which is then relieved after he urinates some  blood. Of note, patient also endorsing bright red blood per rectum.  Patient does endorse history of hemorrhoids and states that this is normal for him.  Patient did eat some food that did not agree with his stomach  and has been having increased bowel movements and some increased hemorrhoid bleeding since yesterday.  Rectal exam appreciating hemorrhoids and bright red blood. I Ordered, and personally interpreted labs.  CMP with mild hyperkalemia at 5.3.  Chloride is low at 91.  BUN/creatinine elevated at patient's baseline.  CBC with mild anemia with hemoglobin 11.2.  No leukocytosis.  UA not very useful given the amount of blood. I ordered imaging studies including CT abd/pelv . I independently visualized and interpreted imaging which showed no acute disease. I agree with the radiologist interpretation I requested consultation with the [facility] Urologist on-call Dr. Arlena Lacrosse,  and discussed lab and imaging findings as well as pertinent plan - they recommend: flushing bladder with NS to obtain UA and testing for UTI. Patient can follow up outpatient. UA was not overall helpful.  I will start patient on antibiotics for possible UTI.  Patient understands that he will need to have close follow-up with his outpatient neurologist.  I called pharmacy who was able to help me prescribe 200 mg of paroxetine once daily for 7 days. I had shared decision making with patient given his increased hemorrhoid bleeding recently.  Patient stating that he would like to go home and does not want to be admitted for possible colonoscopy.  Educated patient that he will need to have close follow-up with his outpatient GI provider.  Patient verbalized understanding of plan.  Patient stating that he just really wants to go home and get something to eat and go to dialysis tomorrow. Staffed with Dr. Tegeler. I have reviewed the patients home medicines and have made adjustments as needed The patient has been  appropriately medically screened and/or stabilized in the ED. I have low suspicion for any other emergent medical condition which would require further screening, evaluation or treatment in the ED or require inpatient management. At time of discharge the patient is hemodynamically stable and in no acute distress. I have discussed work-up results and diagnosis with patient and answered all questions. Patient is agreeable with discharge plan. We discussed strict return precautions for returning to the emergency department and they verbalized understanding.     Social Determinants of Health:  none         Final Clinical Impression(s) / ED Diagnoses Final diagnoses:  Gross hematuria  Hemorrhoids, unspecified hemorrhoid type    Rx / DC Orders ED Discharge Orders          Ordered    cefpodoxime (VANTIN) 200 MG tablet  Daily        12/13/23 2103              [place], [place] 12/13/23 2117    Tegeler, Marine Sia, MD 12/13/23 [phone]

## 2023-12-13 NOTE — Discharge Instructions (Addendum)
 It was a pleasure caring for you today.  Please follow-up with your urologist and GI provider.  Seek emergency care if experiencing any new or worsening symptoms.  On dialysis days, please take antibiotics after dialysis.

## 2023-12-13 NOTE — ED Notes (Signed)
 ED Provider at bedside.

## 2023-12-13 NOTE — ED Notes (Deleted)
 Called [facility] line for Dr. Kristeen Peto Neurology.

## 2023-12-13 NOTE — ED Notes (Signed)
 Paged the on call Urologist for [facility] to PA

## 2023-12-13 NOTE — ED Triage Notes (Signed)
 Blood in urine with clots x 1 month Painful, more painful in last few days HD with small amounts of urine  States he was sent to get cath for UA to sent for culture for treatment

## 2023-12-13 NOTE — ED Notes (Addendum)
 Bladder scan attempted... 0ml... Provider informed.Devin AasAaron Bullock

## 2023-12-13 NOTE — ED Notes (Signed)
 PA informed that she would have to perform the foley insertion... Provider aware and going to notify staff when she is ready to insert foley.Aaron AasAaron Aas

## 2023-12-13 NOTE — ED Notes (Signed)
 Called [facility] line for Dr. Kristeen Peto Urology.

## 2023-12-21 ENCOUNTER — Other Ambulatory Visit: Payer: Self-pay | Admitting: Podiatry

## 2024-01-16 ENCOUNTER — Emergency Department (HOSPITAL_COMMUNITY)

## 2024-01-16 ENCOUNTER — Encounter (HOSPITAL_COMMUNITY): Payer: Self-pay

## 2024-01-16 ENCOUNTER — Inpatient Hospital Stay (HOSPITAL_COMMUNITY)
Admission: EM | Admit: 2024-01-16 | Discharge: 2024-01-20 | DRG: 371 | Disposition: A | Discharge status: Home / Self Care | Attending: Internal Medicine | Admitting: Internal Medicine

## 2024-01-16 ENCOUNTER — Other Ambulatory Visit: Payer: Self-pay

## 2024-01-16 DIAGNOSIS — R310 Gross hematuria: Secondary | ICD-10-CM | POA: Diagnosis present

## 2024-01-16 DIAGNOSIS — N186 End stage renal disease: Secondary | ICD-10-CM | POA: Diagnosis present

## 2024-01-16 DIAGNOSIS — D631 Anemia in chronic kidney disease: Secondary | ICD-10-CM | POA: Diagnosis present

## 2024-01-16 DIAGNOSIS — I420 Dilated cardiomyopathy: Secondary | ICD-10-CM | POA: Diagnosis present

## 2024-01-16 DIAGNOSIS — C679 Malignant neoplasm of bladder, unspecified: Secondary | ICD-10-CM | POA: Diagnosis present

## 2024-01-16 DIAGNOSIS — E66812 Obesity, class 2: Secondary | ICD-10-CM | POA: Diagnosis present

## 2024-01-16 DIAGNOSIS — K683 Retroperitoneal hematoma: Secondary | ICD-10-CM | POA: Diagnosis present

## 2024-01-16 DIAGNOSIS — D62 Acute posthemorrhagic anemia: Secondary | ICD-10-CM | POA: Diagnosis not present

## 2024-01-16 DIAGNOSIS — I9589 Other hypotension: Secondary | ICD-10-CM | POA: Diagnosis present

## 2024-01-16 DIAGNOSIS — N2581 Secondary hyperparathyroidism of renal origin: Secondary | ICD-10-CM | POA: Diagnosis present

## 2024-01-16 DIAGNOSIS — Z8546 Personal history of malignant neoplasm of prostate: Secondary | ICD-10-CM | POA: Diagnosis not present

## 2024-01-16 DIAGNOSIS — K863 Pseudocyst of pancreas: Secondary | ICD-10-CM | POA: Diagnosis not present

## 2024-01-16 DIAGNOSIS — I132 Hypertensive heart and chronic kidney disease with heart failure and with stage 5 chronic kidney disease, or end stage renal disease: Secondary | ICD-10-CM | POA: Diagnosis present

## 2024-01-16 DIAGNOSIS — E785 Hyperlipidemia, unspecified: Secondary | ICD-10-CM | POA: Diagnosis present

## 2024-01-16 DIAGNOSIS — Z6838 Body mass index (BMI) 38.0-38.9, adult: Secondary | ICD-10-CM

## 2024-01-16 DIAGNOSIS — K625 Hemorrhage of anus and rectum: Secondary | ICD-10-CM

## 2024-01-16 DIAGNOSIS — I5043 Acute on chronic combined systolic (congestive) and diastolic (congestive) heart failure: Secondary | ICD-10-CM | POA: Diagnosis not present

## 2024-01-16 DIAGNOSIS — D649 Anemia, unspecified: Principal | ICD-10-CM

## 2024-01-16 DIAGNOSIS — Z992 Dependence on renal dialysis: Secondary | ICD-10-CM | POA: Diagnosis not present

## 2024-01-16 DIAGNOSIS — K921 Melena: Secondary | ICD-10-CM | POA: Diagnosis present

## 2024-01-16 DIAGNOSIS — K922 Gastrointestinal hemorrhage, unspecified: Secondary | ICD-10-CM | POA: Insufficient documentation

## 2024-01-16 DIAGNOSIS — R58 Hemorrhage, not elsewhere classified: Secondary | ICD-10-CM

## 2024-01-16 DIAGNOSIS — R319 Hematuria, unspecified: Secondary | ICD-10-CM | POA: Diagnosis not present

## 2024-01-16 HISTORY — DX: End stage renal disease: N18.6

## 2024-01-16 HISTORY — DX: End stage renal disease: Z99.2

## 2024-01-16 LAB — COMPREHENSIVE METABOLIC PANEL WITH GFR
ALT: 17 U/L (ref 0–44)
AST: 23 U/L (ref 15–41)
Albumin: 2.7 g/dL — ABNORMAL LOW (ref 3.5–5.0)
Alkaline Phosphatase: 58 U/L (ref 38–126)
Anion gap: 17 — ABNORMAL HIGH (ref 5–15)
BUN: 88 mg/dL — ABNORMAL HIGH (ref 6–20)
CO2: 28 mmol/L (ref 22–32)
Calcium: 8.8 mg/dL — ABNORMAL LOW (ref 8.9–10.3)
Chloride: 93 mmol/L — ABNORMAL LOW (ref 98–111)
Creatinine, Ser: 15.56 mg/dL — ABNORMAL HIGH (ref 0.61–1.24)
GFR, Estimated: 3 mL/min — ABNORMAL LOW (ref >60)
Glucose, Bld: 105 mg/dL — ABNORMAL HIGH (ref 70–99)
Potassium: 4.2 mmol/L (ref 3.5–5.1)
Sodium: 138 mmol/L (ref 135–145)
Total Bilirubin: 0.6 mg/dL (ref 0.0–1.2)
Total Protein: 6.7 g/dL (ref 6.5–8.1)

## 2024-01-16 LAB — CBC WITH DIFFERENTIAL/PLATELET
Abs Immature Granulocytes: 0.06 10*3/uL (ref 0.00–0.07)
Basophils Absolute: 0 10*3/uL (ref 0.0–0.1)
Basophils Relative: 0 %
Eosinophils Absolute: 0.2 10*3/uL (ref 0.0–0.5)
Eosinophils Relative: 2 %
HCT: 16.4 % — ABNORMAL LOW (ref 39.0–52.0)
Hemoglobin: 5.1 g/dL — CL (ref 13.0–17.0)
Immature Granulocytes: 1 %
Lymphocytes Relative: 11 %
Lymphs Abs: 1 10*3/uL (ref 0.7–4.0)
MCH: 31.9 pg (ref 26.0–34.0)
MCHC: 31.1 g/dL (ref 30.0–36.0)
MCV: 102.5 fL — ABNORMAL HIGH (ref 80.0–100.0)
Monocytes Absolute: 0.8 10*3/uL (ref 0.1–1.0)
Monocytes Relative: 9 %
Neutro Abs: 6.9 10*3/uL (ref 1.7–7.7)
Neutrophils Relative %: 77 %
Platelets: 419 10*3/uL — ABNORMAL HIGH (ref 150–400)
RBC: 1.6 MIL/uL — ABNORMAL LOW (ref 4.22–5.81)
RDW: 17.9 % — ABNORMAL HIGH (ref 11.5–15.5)
WBC: 8.9 10*3/uL (ref 4.0–10.5)
nRBC: 0 % (ref 0.0–0.2)

## 2024-01-16 LAB — URINALYSIS, ROUTINE W REFLEX MICROSCOPIC: RBC / HPF: >50 RBC/hpf (ref 0–5)

## 2024-01-16 LAB — RETICULOCYTES
Immature Retic Fract: 26.8 % — ABNORMAL HIGH (ref 2.3–15.9)
RBC.: 1.6 MIL/uL — ABNORMAL LOW (ref 4.22–5.81)
Retic Count, Absolute: 76.6 10*3/uL (ref 19.0–186.0)
Retic Ct Pct: 4.8 % — ABNORMAL HIGH (ref 0.4–3.1)

## 2024-01-16 LAB — FOLATE: Folate: 15.4 ng/mL (ref >5.9)

## 2024-01-16 LAB — PREPARE RBC (CROSSMATCH)

## 2024-01-16 LAB — MRSA NEXT GEN BY PCR, NASAL: MRSA by PCR Next Gen: DETECTED — AB

## 2024-01-16 LAB — HEPATITIS B SURFACE ANTIGEN: Hepatitis B Surface Ag: NONREACTIVE

## 2024-01-16 LAB — PHOSPHORUS: Phosphorus: 6.7 mg/dL — ABNORMAL HIGH (ref 2.5–4.6)

## 2024-01-16 LAB — VITAMIN B12: Vitamin B-12: 807 pg/mL (ref 180–914)

## 2024-01-16 MED ORDER — SODIUM CHLORIDE 0.9% IV SOLUTION
Freq: Once | INTRAVENOUS | Status: AC
  Administered 2024-01-16: 10 mL/h via INTRAVENOUS

## 2024-01-16 MED ORDER — METOPROLOL TARTRATE 25 MG PO TABS: 12.5000 mg | ORAL_TABLET | Freq: Two times a day (BID) | ORAL | Status: DC

## 2024-01-16 MED ORDER — GUAIFENESIN-DM 100-10 MG/5ML PO SYRP
5.0000 mL | ORAL_SOLUTION | ORAL | Status: DC | PRN
  Administered 2024-01-16 – 2024-01-19 (×6): 5 mL via ORAL
  Filled 2024-01-16 (×6): qty 5

## 2024-01-16 MED ORDER — MUPIROCIN 2 % EX OINT
1.0000 | TOPICAL_OINTMENT | Freq: Two times a day (BID) | CUTANEOUS | Status: DC
  Administered 2024-01-16 – 2024-01-19 (×7): 1 via NASAL
  Filled 2024-01-16 (×2): qty 22

## 2024-01-16 MED ORDER — CINACALCET HCL 30 MG PO TABS
60.0000 mg | ORAL_TABLET | Freq: Every day | ORAL | Status: DC
  Administered 2024-01-17 – 2024-01-19 (×3): 60 mg via ORAL
  Filled 2024-01-16 (×4): qty 2

## 2024-01-16 MED ORDER — PANTOPRAZOLE SODIUM 40 MG IV SOLR
40.0000 mg | Freq: Once | INTRAVENOUS | Status: AC
  Administered 2024-01-16: 40 mg via INTRAVENOUS
  Filled 2024-01-16: qty 10

## 2024-01-16 MED ORDER — SEVELAMER CARBONATE 800 MG PO TABS
3200.0000 mg | ORAL_TABLET | Freq: Three times a day (TID) | ORAL | Status: DC
  Administered 2024-01-16 – 2024-01-19 (×7): 3200 mg via ORAL
  Filled 2024-01-16 (×9): qty 4

## 2024-01-16 MED ORDER — MIDODRINE HCL 5 MG PO TABS
5.0000 mg | ORAL_TABLET | ORAL | Status: DC
  Administered 2024-01-18: 5 mg via ORAL
  Filled 2024-01-16: qty 1

## 2024-01-16 MED ORDER — ATORVASTATIN CALCIUM 40 MG PO TABS
40.0000 mg | ORAL_TABLET | Freq: Every day | ORAL | Status: DC
  Administered 2024-01-17 – 2024-01-19 (×3): 40 mg via ORAL
  Filled 2024-01-16 (×4): qty 1

## 2024-01-16 MED ORDER — CHLORHEXIDINE GLUCONATE CLOTH 2 % EX PADS
6.0000 | MEDICATED_PAD | Freq: Every day | CUTANEOUS | Status: DC
  Administered 2024-01-17 – 2024-01-19 (×3): 6 via TOPICAL

## 2024-01-16 NOTE — Consult Note (Signed)
 Consultation  Referring Provider: [facility]/ Macarthur Savory  Primary Care Physician:  Artie Laster, MD Primary Gastroenterologist:  Dr.Dorsey  Reason for Consultation: Profound anemia, chronic rectal bleeding  HPI: Devin Bullock is a 58 y.o. male with history of end-stage renal disease, on dialysis Monday Wednesday Friday, who had labs done during his dialysis session on Friday and was called today and told not to come to dialysis and to present to the emergency room for severe anemia with hemoglobin in the 5 range. Patient also has history of prostate cancer, diagnosed in 2022, status postradiation, history of congestive heart failure with reduced EF most recent echo October 2024 with EF of 50 to 55%.,  History of hypertension and hyperparathyroid . Patient had initially been seen in our office in September 2020 for by myself with complaints of rectal bleeding which she described as bright red blood primarily being noted on the tissue with bowel movements.  He was to be scheduled for colonoscopy at the hospital, he required cardiac workup and then had other medical issues and colonoscopy ultimately was rescheduled.  He was then seen back in the office in March 2025 with his same complaints and is scheduled for upcoming colonoscopy on 02/02/2024. He reports that the pattern of his rectal bleeding has not changed, his bowel movements have been normal and he has no complaints of abdominal pain.  He says he does not see blood with every bowel movement but frequently and usually just red blood on the tissue. Over the past 4 to 5 days he has noticed fairly severe right back pain which is made it difficult for him to sleep.  He has also been having hematuria which has been intermittent over the past few weeks. He has a urologist and has been told that he has a bladder tumor which needs to be resected.  He is apparently scheduled for TUR on 01/24/2024.  He says he has been seeing a lot more blood in his urine than the  volume of blood that he sees from his rectum. Again the right back pain is new over the past 4 to 5 days and he has been feeling more fatigued over the past couple of weeks.  He is not on any anticoagulation or antiplatelet therapy  Labs today show hemoglobin of 5.1/hematocrit 16.4/MCV 102.5/platelets 419 Last labs available in our system from 12/13/2023 showed hemoglobin of 11.2. Potassium 4.2/glucose 105/BUN 88/creatinine 15.56, albumin  2.7 LFTs within normal limits Iron studies pending today.  CT of the abdomen pelvis today with stone protocol-multiple hepatic cysts, there is stranding around the right kidney and the perirenal fascia as well as a poorly organized retroperitoneal density measuring 10 x 8.9 x 13 cm consistent with a retroperitoneal hematoma.  Findings are consistent with the perirenal retroperitoneal hematoma no obvious masses, there are blood clots noted in the right renal pelvis and proximal ureter.  Patient is to be transfused 2 units of packed RBCs, 1 unit finished presently He has been hemodynamically stable.  No prior colonoscopy, family history of colon cancer in 2 aunts.   Past Medical History:  Diagnosis Date   Anemia    AV fistula (HCC)    Cancer (HCC)    prostate and bladder cancer - 2022 diagnosed   CHF (congestive heart failure) (HCC)    patient denies   ESRD on hemodialysis (HCC)    [facility]   History of blood transfusion 03/2020   Hypertension    history of, no longer taking medication at  this time   Morbid obesity (HCC)    Parathyroid disorder [facility])     Past Surgical History:  Procedure Laterality Date   AV FISTULA PLACEMENT Left 05/24/2014   Procedure: BRACHIOCEPHALIC ARTERIOVENOUS (AV) FISTULA CREATION;  Surgeon: Dannis Dy, MD;  Location: [facility] OR;  Service: Vascular;  Laterality: Left;   AV FISTULA PLACEMENT Left 09/15/2023   Procedure: LEFT ARM ARTERIOVENOUS (AV) FISTULA REVISION AND PLICATION;  Surgeon: Young Hensen,  MD;  Location: [facility] OR;  Service: Vascular;  Laterality: Left;   ESOPHAGOGASTRODUODENOSCOPY (EGD) WITH PROPOFOL  N/A 04/03/2020   Procedure: ESOPHAGOGASTRODUODENOSCOPY (EGD) WITH PROPOFOL ;  Surgeon: Genell Ken, MD;  Location: [facility] ENDOSCOPY;  Service: Gastroenterology;  Laterality: N/A;   PROSTATE BIOPSY N/A 11/27/2020   Procedure: BIOPSY TRANSRECTAL ULTRASONIC PROSTATE (TUBP);  Surgeon: Samson Croak, MD;  Location: [facility] ORS;  Service: Urology;  Laterality: N/A;  REQUESTING 19 MINS FOR CASE   REVISON OF ARTERIOVENOUS FISTULA Left 01/08/2021   Procedure: REVISON PLICATION OF ARTERIOVENOUS FISTULA LEFT;  Surgeon: Adine Hoof, MD;  Location: [facility] OR;  Service: Vascular;  Laterality: Left;   SURGICAL EXCISION OF EXCESSIVE SKIN Left 09/15/2023   Procedure: SURGICAL EXCISION OF EXCESSIVE SKIN, ULCERATIVE SKIN;  Surgeon: Young Hensen, MD;  Location: [facility] OR;  Service: Vascular;  Laterality: Left;   THROMBECTOMY W/ EMBOLECTOMY Left 09/15/2023   Procedure: THROMBECTOMY ARTERIOVENOUS FISTULA;  Surgeon: Young Hensen, MD;  Location: [facility] OR;  Service: Vascular;  Laterality: Left;   TRANSURETHRAL RESECTION OF BLADDER TUMOR N/A 11/27/2020   Procedure: TRANSURETHRAL RESECTION OF BLADDER TUMOR (TURBT);  Surgeon: Samson Croak, MD;  Location: [facility] ORS;  Service: Urology;  Laterality: N/A;  NEED PARALYSIS    Prior to Admission medications   Medication Sig Start Date End Date Taking? Authorizing Provider  atorvastatin  (LIPITOR) 40 MG tablet Take 1 tablet (40 mg total) by mouth daily. 05/25/23  Yes Emokpae, Courage, MD  B Complex-C-Folic Acid (RENA-VITE RX) 1 MG TABS Take 1 tablet by mouth daily. 11/21/23  Yes [provider]  cefpodoxime  (VANTIN ) 200 MG tablet Take 1 tablet by mouth daily. 01/04/24  Yes [provider]  cinacalcet  (SENSIPAR ) 60 MG tablet Take 60 mg by mouth daily.   Yes [provider]  guaiFENesin  (MUCINEX ) 600 MG 12 hr tablet Take 1 tablet (600 mg  total) by mouth 2 (two) times daily as needed for cough or to loosen phlegm. 05/25/23  Yes Emokpae, Courage, MD  guaifenesin  (ROBITUSSIN) 100 MG/5ML syrup Take 200 mg by mouth 3 (three) times daily as needed for cough.   Yes [provider]  Methoxy PEG-Epoetin  Beta (MIRCERA IJ) Mircera 10/17/23 10/15/24 Yes [provider]  metoprolol  tartrate (LOPRESSOR ) 25 MG tablet Take 0.5 tablets (12.5 mg total) by mouth 2 (two) times daily. 07/08/23  Yes Monge, Nelva Bang, NP  midodrine (PROAMATINE) 5 MG tablet Take 5 mg by mouth. Taking Monday, Wednesday and Friday at dialysis 10/07/23  Yes [provider]  sevelamer  carbonate (RENVELA ) 800 MG tablet Take 2,400-3,200 mg by mouth 2 (two) times daily. 05/17/23  Yes [provider]  B Complex-C-Folic Acid (B-COMPLEX/FOLIC ACID/VITAMIN C) TBCR Take 1 tablet by mouth daily. Patient not taking: Reported on 01/16/2024 11/02/23   [provider]  terbinafine  (LAMISIL ) 250 MG tablet TAKE 1 TABLET BY MOUTH EVERY DAY Patient not taking: Reported on 01/16/2024 12/22/23   Clemetine Cypher, DPM    Current Facility-Administered Medications  Medication Dose Route Frequency Provider Last Rate Last  Admin   0.9 %  sodium chloride  infusion (Manually program via Guardrails IV Fluids)   Intravenous Once Albertus Hughs, DO       [START ON 01/17/2024] atorvastatin  (LIPITOR) tablet 40 mg  40 mg Oral Daily Pandora Bogaert W, DO       [START ON 01/17/2024] Chlorhexidine  Gluconate Cloth 2 % PADS 6 each  6 each Topical Q0600 Chucky Craver, MD       [START ON 01/17/2024] cinacalcet  (SENSIPAR ) tablet 60 mg  60 mg Oral Daily Calkins, Derek W, DO       [START ON 01/18/2024] midodrine (PROAMATINE) tablet 5 mg  5 mg Oral Q M,W,F-HD Chucky Craver, MD       sevelamer  carbonate (RENVELA ) tablet 3,200 mg  3,200 mg Oral TID AC Jodeane Mulligan, DO       Current Outpatient Medications  Medication Sig Dispense Refill   atorvastatin  (LIPITOR) 40 MG tablet Take 1 tablet (40 mg  total) by mouth daily. 30 tablet 2   B Complex-C-Folic Acid (RENA-VITE RX) 1 MG TABS Take 1 tablet by mouth daily.     cefpodoxime  (VANTIN ) 200 MG tablet Take 1 tablet by mouth daily.     cinacalcet  (SENSIPAR ) 60 MG tablet Take 60 mg by mouth daily.     guaiFENesin  (MUCINEX ) 600 MG 12 hr tablet Take 1 tablet (600 mg total) by mouth 2 (two) times daily as needed for cough or to loosen phlegm. 20 tablet 1   guaifenesin  (ROBITUSSIN) 100 MG/5ML syrup Take 200 mg by mouth 3 (three) times daily as needed for cough.     Methoxy PEG-Epoetin  Beta (MIRCERA IJ) Mircera     metoprolol  tartrate (LOPRESSOR ) 25 MG tablet Take 0.5 tablets (12.5 mg total) by mouth 2 (two) times daily. 60 tablet 11   midodrine (PROAMATINE) 5 MG tablet Take 5 mg by mouth. Taking Monday, Wednesday and Friday at dialysis     sevelamer  carbonate (RENVELA ) 800 MG tablet Take 2,400-3,200 mg by mouth 2 (two) times daily.     B Complex-C-Folic Acid (B-COMPLEX/FOLIC ACID/VITAMIN C) TBCR Take 1 tablet by mouth daily. (Patient not taking: Reported on 01/16/2024)     terbinafine  (LAMISIL ) 250 MG tablet TAKE 1 TABLET BY MOUTH EVERY DAY (Patient not taking: Reported on 01/16/2024) 30 tablet 0    Allergies as of 01/16/2024   (No Known Allergies)    Family History  Problem Relation Age of Onset   Diabetes Mother    Hypertension Mother    Heart attack Mother 36   Stroke Father    Hypertension Father    Depression Father    Diabetes Brother    Hypertension Brother    Stroke Brother 48   Alcohol abuse Brother    Asthma Brother    Colon cancer Neg Hx    Esophageal cancer Neg Hx    Stomach cancer Neg Hx     Social History   Socioeconomic History   Marital status: Single    Spouse name: Not on file   Number of children: 2   Years of education: Not on file   Highest education level: Not on file  Occupational History   Occupation: disable  Tobacco Use   Smoking status: Never    Passive exposure: Never   Smokeless tobacco: Never   Vaping Use   Vaping status: Never Used  Substance and Sexual Activity   Alcohol use: No   Drug use: No   Sexual activity: Never  Other Topics Concern  Not on file  Social History Narrative   Lives alone.    Social Drivers of Corporate investment banker Strain: Not on file  Food Insecurity: Low Risk  (11/24/2023)   Received from [facility]   Hunger Vital Sign    Worried About Running Out of Food in the Last Year: Never true    Ran Out of Food in the Last Year: Never true  Transportation Needs: No Transportation Needs (11/24/2023)   Received from [facility]    In the past 12 months, has lack of reliable transportation kept you from medical appointments, meetings, work or from getting things needed for daily living? : No  Physical Activity: Not on file  Stress: Not on file  Social Connections: Not on file  Intimate Partner Violence: Not At Risk (05/21/2023)   Humiliation, Afraid, Rape, and Kick questionnaire    Fear of Current or Ex-Partner: No    Emotionally Abused: No    Physically Abused: No    Sexually Abused: No    Review of Systems: Pertinent positive and negative review of systems were noted in the above HPI section.  All other review of systems was otherwise negative.   Physical Exam: Vital signs in last 24 hours: Temp:  [97.3 F (36.3 C)-98.7 F (37.1 C)] 98.1 F (36.7 C) (06/02 1716) Pulse Rate:  [105-119] 107 (06/02 1716) Resp:  [14-19] 17 (06/02 1716) BP: (103-136)/(53-77) 136/77 (06/02 1716) SpO2:  [99 %-100 %] 100 % (06/02 1716)   General:   Alert,  Well-developed, obese older AA male , pleasant and cooperative in NAD, sitting on side of the bed Head:  Normocephalic and atraumatic. Eyes:  Sclera clear, no icterus.   Conjunctiva pale Ears:  Normal auditory acuity. Nose:  No deformity, discharge,  or lesions. Mouth:  No deformity or lesions.   Neck:  Supple; no masses or thyromegaly. Lungs:   Clear throughout to auscultation.    No wheezes, crackles, or rhonchi. Heart:  Regular rate and rhythm; no murmurs, clicks, rubs,  or gallops. Abdomen:  Soft,obese BS active,nonpalp mass or hsm.- some tenderness to palpation right flank   Rectal:  not done Msk:  Symmetrical without gross deformities. . Pulses:  Normal pulses noted. Extremities: Chronic stasis changes, trace edema Neurologic:  Alert and  oriented x4;  grossly normal neurologically. Skin:  Intact without significant lesions or rashes.. Psych:  Alert and cooperative. Normal mood and affect.  Intake/Output from previous day: No intake/output data recorded. Intake/Output this shift: Total I/O In: 367 [Blood:367] Out: -   Lab Results: Recent Labs    01/16/24 1130  WBC 8.9  HGB 5.1*  HCT 16.4*  PLT 419*   BMET Recent Labs    01/16/24 1130  NA 138  K 4.2  CL 93*  CO2 28  GLUCOSE 105*  BUN 88*  CREATININE 15.56*  CALCIUM  8.8*   LFT Recent Labs    01/16/24 1130  PROT 6.7  ALBUMIN  2.7*  AST 23  ALT 17  ALKPHOS 58  BILITOT 0.6   PT/INR No results for input(s): "LABPROT", "INR" in the last 72 hours. Hepatitis Panel No results for input(s): "HEPBSAG", "HCVAB", "HEPAIGM", "HEPBIGM" in the last 72 hours.    IMPRESSION:  #46 58 year old African-American male with end-stage renal disease on dialysis sent to the ER after labs from Friday dialysis session showed hemoglobin in the 5 range. Hemoglobin 5.1 today-down from hemoglobin of 11.2 on 12/13/2023.  Patient has had chronic complaints for  almost a year of low volume hematochezia noticing bright red blood on the tissue with bowel movements He is scheduled for colonoscopy outpatient at the hospital on 02/02/2024  Doubt that his rectal bleeding is responsible for his acute drop in hemoglobin.  He has been having overt hematuria over the past couple of weeks, apparently recently diagnosed with a small bladder tumor and is scheduled for TUR on 01/24/2024  Also with new complaint of right back  pain over the past 4 to 5 days and new finding of large perirenal retroperitoneal hematoma, with clots also noted in the right renal pelvis and proximal ureter  These findings are very likely source of his acute drop in hemoglobin.  #2 history of prostate cancer status postradiation #3 morbid obesity #4.  Congestive heart failure with reduced EF most recent echo with EF 50 to 55% #5 hypertension  Plan; okay for regular diet from GI perspective Transfuse to keep hemoglobin 7 Patient needs urology evaluation and management. Once his urologic issues have been sorted out and any urologic procedures indicated completed then we can discuss colonoscopy during this admission if he wishes to proceed while in inpatient.  Will reevaluate tomorrow and await urology input   Zalman Hull PA-C 01/16/2024, 5:29 PM

## 2024-01-16 NOTE — ED Provider Notes (Signed)
 [place] [facility] DEPARTMENT AT [facility] Provider Note   CSN: [id number] Arrival date & time: 01/16/24  1034     History  Chief Complaint  Patient presents with   Low Hgb   Dialysis pt   Shortness of Breath    Devin Bullock is a 57 y.o. male.  58 yo M with a chief complaints of a low hemoglobin level.  Patient went to dialysis on Friday and apparently blood work done then showed that his hemoglobin was 5 he was called today and told not to come to dialysis but to go to the hospital to get a blood transfusion.  The patient unfortunately has been having significant hematuria for some time and he also has been having GI bleeding for some time.  He says he has scheduled both a colonoscopy and a urologic surgical procedure.  These are coming up in the next couple weeks.  He denies any significant change in the amount of bleeding.  He did go to urgent care yesterday and was told that maybe he had a urinary tract infection and he was started on antibiotics.  He has been having some pain mostly to his right side.  He thought maybe it was his kidney.   Shortness of Breath      Home Medications Prior to Admission medications   Medication Sig Start Date End Date Taking? Authorizing Provider  atorvastatin  (LIPITOR) 40 MG tablet Take 1 tablet (40 mg total) by mouth daily. 05/25/23  Yes Emokpae, Courage, MD  B Complex-C-Folic Acid (RENA-VITE RX) 1 MG TABS Take 1 tablet by mouth daily. 11/21/23  Yes [provider]  cefpodoxime  (VANTIN ) 200 MG tablet Take 1 tablet by mouth daily. 01/04/24  Yes [provider]  cinacalcet  (SENSIPAR ) 60 MG tablet Take 60 mg by mouth daily.   Yes [provider]  guaiFENesin  (MUCINEX ) 600 MG 12 hr tablet Take 1 tablet (600 mg total) by mouth 2 (two) times daily as needed for cough or to loosen phlegm. 05/25/23  Yes Emokpae, Courage, MD  guaifenesin  (ROBITUSSIN) 100 MG/5ML syrup Take 200 mg by mouth 3 (three) times daily as needed  for cough.   Yes [provider]  Methoxy PEG-Epoetin  Beta (MIRCERA IJ) Mircera 10/17/23 10/15/24 Yes [provider]  metoprolol  tartrate (LOPRESSOR ) 25 MG tablet Take 0.5 tablets (12.5 mg total) by mouth 2 (two) times daily. 07/08/23  Yes Monge, Nelva Bang, NP  midodrine (PROAMATINE) 5 MG tablet Take 5 mg by mouth. Taking Monday, Wednesday and Friday at dialysis 10/07/23  Yes [provider]  sevelamer  carbonate (RENVELA ) 800 MG tablet Take 2,400-3,200 mg by mouth 2 (two) times daily. 05/17/23  Yes [provider]  B Complex-C-Folic Acid (B-COMPLEX/FOLIC ACID/VITAMIN C) TBCR Take 1 tablet by mouth daily. Patient not taking: Reported on 01/16/2024 11/02/23   [provider]  terbinafine  (LAMISIL ) 250 MG tablet TAKE 1 TABLET BY MOUTH EVERY DAY Patient not taking: Reported on 01/16/2024 12/22/23   Clemetine Cypher, DPM      Allergies    Patient has no known allergies.    Review of Systems   Review of Systems  Respiratory:  Positive for shortness of breath.     Physical Exam Updated Vital Signs BP 130/73   Pulse (!) 105   Temp 98 F (36.7 C) (Oral)   Resp 14   SpO2 100%  Physical Exam Vitals and nursing note reviewed.  Constitutional:      Appearance: He is well-developed.  HENT:     Head: Normocephalic and atraumatic.  Eyes:     Pupils: Pupils are equal, round, and reactive to light.  Neck:     Vascular: No JVD.  Cardiovascular:     Rate and Rhythm: Normal rate and regular rhythm.     Heart sounds: No murmur heard.    No friction rub. No gallop.  Pulmonary:     Effort: No respiratory distress.     Breath sounds: No wheezing.  Abdominal:     General: There is no distension.     Tenderness: There is no abdominal tenderness. There is no guarding or rebound.     Comments: Diffuse abdominal pain.   Musculoskeletal:        General: Normal range of motion.     Cervical back: Normal range of motion and neck supple.  Skin:    Coloration: Skin is not  pale.     Findings: No rash.  Neurological:     Mental Status: He is alert and oriented to person, place, and time.  Psychiatric:        Behavior: Behavior normal.     ED Results / Procedures / Treatments   Labs (all labs ordered are listed, but only abnormal results are displayed) Labs Reviewed  CBC WITH DIFFERENTIAL/PLATELET - Abnormal; Notable for the following components:      Result Value   RBC 1.60 (*)    Hemoglobin 5.1 (*)    HCT 16.4 (*)    MCV 102.5 (*)    RDW 17.9 (*)    Platelets 419 (*)    All other components within normal limits  COMPREHENSIVE METABOLIC PANEL WITH GFR - Abnormal; Notable for the following components:   Chloride 93 (*)    Glucose, Bld 105 (*)    BUN 88 (*)    Creatinine, Ser 15.56 (*)    Calcium  8.8 (*)    Albumin  2.7 (*)    GFR, Estimated 3 (*)    Anion gap 17 (*)    All other components within normal limits  RETICULOCYTES - Abnormal; Notable for the following components:   Retic Ct Pct 4.8 (*)    RBC. 1.60 (*)    Immature Retic Fract 26.8 (*)    All other components within normal limits  URINALYSIS, ROUTINE W REFLEX MICROSCOPIC  URINALYSIS, ROUTINE W REFLEX MICROSCOPIC  PHOSPHORUS  HEPATITIS B SURFACE ANTIGEN  HEPATITIS B SURFACE ANTIBODY, QUANTITATIVE  PROTIME-INR  APTT  FERRITIN  IRON AND TIBC  FOLATE  VITAMIN B12  TYPE AND SCREEN  PREPARE RBC (CROSSMATCH)    EKG None  Radiology CT Renal Stone Study Result Date: 01/16/2024 CLINICAL DATA:  Abdominal pain flank pain EXAM: CT ABDOMEN AND PELVIS WITHOUT CONTRAST TECHNIQUE: Multidetector CT imaging of the abdomen and pelvis was performed following the standard protocol without IV contrast. RADIATION DOSE REDUCTION: This exam was performed according to the departmental dose-optimization program which includes automated exposure control, adjustment of the mA and/or kV according to patient size and/or use of iterative reconstruction technique. COMPARISON:  December 13, 2023 FINDINGS:  Lower chest: Minimal left lower lobe hypoventilatory atelectasis, subtle infiltrates? Correlate clinically. No consolidations. Minute pericardial prominence with perhaps minute amount of pericardial fluid. Hepatobiliary: Comparison with prior examinations demonstrates no significant change in the multiple hepatic cysts throughout the right lobe of the liver. No new findings. Pancreas: Unremarkable. No pancreatic ductal dilatation or surrounding inflammatory changes. Spleen: Normal in size without focal abnormality. Adrenals/Urinary Tract: Comparison with prior examination accounting  for differences in techniques the right kidney appears larger than on prior examination. High areas of attenuation within the mid and lower pole calices and renal pelvis with a dilated right proximal ureter to the iliac vessel crossing are consistent with hematuria and blood clots. No obvious kidney stones are identified in the trajectory of the ureter. There is stranding surrounding the right kidney and the perirenal fascia as well as a poorly organized retroperitoneal density that measures 10 x 8.9 by 13 cm in the AP, transverse and longitudinal diameter consistent with retroperitoneal hematoma. Findings are consistent with the perirenal retroperitoneal hematoma. No obvious masses are identified. Small calcific densities are noted surrounding the renal pelvis which are seen on the prior study and correlate perhaps with calcifications of some vascular structures. Comparison with prior examinations demonstrates no significant change in the appearance of the left kidney with multiple cortical cysts and significant cortical atrophy as well as some dystrophic calcifications without evidence of lithiasis or hydronephrosis. Stomach/Bowel: Stomach is within normal limits. Appendix appears normal. No evidence of bowel wall thickening, distention, or inflammatory changes. Vascular/Lymphatic: No significant vascular findings are present. No  enlarged abdominal or pelvic lymph nodes. Reproductive: Prostate is unremarkable. Other: No abdominal wall hernia or abnormality. No abdominopelvic ascites. Musculoskeletal: No acute or significant osseous findings. IMPRESSION: *Findings consistent with right perirenal retroperitoneal hematoma with blood clots in the right renal pelvis and proximal ureter. *No obvious kidney stones are identified. *No significant change in the appearance of the left kidney with multiple cortical cysts and significant cortical atrophy as well as some dystrophic calcifications without evidence of lithiasis or hydronephrosis. *No significant change in the multiple hepatic cysts. *Minimal left lower lobe hypoventilatory atelectasis, subtle infiltrates? Minute pericardial prominence with perhaps minute amount of pericardial fluid. Electronically Signed   By: Fredrich Jefferson M.D.   On: 01/16/2024 12:49    Procedures .Critical Care  Performed by: Albertus Hughs, DO Authorized by: Albertus Hughs, DO   Critical care provider statement:    Critical care time (minutes):  35   Critical care time was exclusive of:  Separately billable procedures and treating other patients   Critical care was time spent personally by me on the following activities:  Development of treatment plan with patient or surrogate, discussions with consultants, evaluation of patient's response to treatment, examination of patient, ordering and review of laboratory studies, ordering and review of radiographic studies, ordering and performing treatments and interventions, pulse oximetry, re-evaluation of patient's condition and review of old charts   Care discussed with: admitting provider       Medications Ordered in ED Medications  0.9 %  sodium chloride  infusion (Manually program via Guardrails IV Fluids) (has no administration in time range)  atorvastatin  (LIPITOR) tablet 40 mg (has no administration in time range)  cinacalcet  (SENSIPAR ) tablet 60 mg (has no  administration in time range)  sevelamer  carbonate (RENVELA ) tablet 3,200 mg (has no administration in time range)  midodrine (PROAMATINE) tablet 5 mg (has no administration in time range)  Chlorhexidine  Gluconate Cloth 2 % PADS 6 each (has no administration in time range)  pantoprazole  (PROTONIX ) injection 40 mg (40 mg Intravenous Given 01/16/24 1451)    ED Course/ Medical Decision Making/ A&P                                 Medical Decision Making Amount and/or Complexity of Data Reviewed Labs: ordered. Radiology: ordered.  Risk  Prescription drug management. Decision regarding hospitalization.   58 yo M with a chief complaint of a low hemoglobin level.  The patient was seen for his routine dialysis on Friday and they must of gotten the results today because they called him and told him not to come to dialysis today that his hemoglobin was 5 and to come to the hospital to get transfusion.  Patient was having some right flank pain.  A CT scan was obtained which shows concern for a new retroperitoneal hematoma.  I discussed this with general surgery, [place] Breath PA-C, working with Dr. Melton Squires, thought to not require surgical intervention.  Typically improve on their own.  Recommended medical resuscitation.  Hemoglobin checked here is 5.1.  Will transfuse 2 units.  No hyperkalemia.  No other significant electrolyte abnormalities.  Will discuss with medicine for admission.  The patients results and plan were reviewed and discussed.   Any x-rays performed were independently reviewed by myself.   Differential diagnosis were considered with the presenting HPI.  Medications  0.9 %  sodium chloride  infusion (Manually program via Guardrails IV Fluids) (has no administration in time range)  atorvastatin  (LIPITOR) tablet 40 mg (has no administration in time range)  cinacalcet  (SENSIPAR ) tablet 60 mg (has no administration in time range)  sevelamer  carbonate (RENVELA ) tablet 3,200 mg (has  no administration in time range)  midodrine (PROAMATINE) tablet 5 mg (has no administration in time range)  Chlorhexidine  Gluconate Cloth 2 % PADS 6 each (has no administration in time range)  pantoprazole  (PROTONIX ) injection 40 mg (40 mg Intravenous Given 01/16/24 1451)    Vitals:   01/16/24 1245 01/16/24 1455 01/16/24 1526 01/16/24 1622  BP: 121/67 136/77 127/77 130/73  Pulse: (!) 106 (!) 108 (!) 106 (!) 105  Resp: 19 18 18 14   Temp:  97.6 F (36.4 C) 98.7 F (37.1 C) 98 F (36.7 C)  TempSrc:  Oral Oral Oral  SpO2: 100% 100% 100% 100%    Final diagnoses:  Symptomatic anemia  Retroperitoneal hematoma    Admission/ observation were discussed with the admitting physician, patient and/or family and they are comfortable with the plan.          Final Clinical Impression(s) / ED Diagnoses Final diagnoses:  Symptomatic anemia  Retroperitoneal hematoma    Rx / DC Orders ED Discharge Orders     None         Albertus Hughs, DO 01/16/24 1637

## 2024-01-16 NOTE — ED Triage Notes (Signed)
 Pt BIB [facility] from home d/t dialysis telling him that his HGB was too low for his Tx. A/Ox4, only c/o gen weakness with SOB.

## 2024-01-16 NOTE — H&P (Signed)
 History and Physical    Patient: Devin Bullock GEX:[id number] DOB: 1966/05/19 DOA: 01/16/2024 DOS: the patient was seen and examined on 01/16/2024 PCP: Artie Laster, MD  Patient coming from: Home  Chief Complaint:  Chief Complaint  Patient presents with   Low Hgb   Dialysis pt   Shortness of Breath    HPI: Devin Bullock is a 58 y.o. male with history of ESRD (MWF), HFrecEF, rectal bleed, anemia, morbid obesity, prostate and bladder cancer with recent partial resection 11/2023 presenting to ED after being notified by dialysis center that his hemoglobin was too low for procedure.  Patient currently lying comfortable and admits to multiple areas of acute on chronic bleeding.  Continues to have bright red blood per rectum which has been ongoing for some months.  Also describes urine coming from his penis that has been occurring ever since cystoscopy and partial bladder malignancy removal less than 2 months ago.  Admits to some aching pain in his back.  Complains of shortness of breath on exertion.  Otherwise denies fever, purulent sputum, chest pain, nausea, vomiting, hematemesis, abdominal pain.  Upon evaluation emergency department, hemoglobin 5.1.  Patient has not had his dialysis session today, creatinine 15.56.  CT scan noting 10 x 9 x 13 cm right sided perirenal retroperitoneal hematoma with blood clots in the right renal pelvis and proximal ureter.   Review of Systems: As mentioned in the history of present illness. All other systems reviewed and are negative. Past Medical History:  Diagnosis Date   Anemia    AV fistula (HCC)    Cancer (HCC)    prostate and bladder cancer - 2022 diagnosed   CHF (congestive heart failure) (HCC)    patient denies   ESRD on hemodialysis (HCC)    MWF [facility]   History of blood transfusion 03/2020   Hypertension    history of, no longer taking medication at this time   Morbid obesity (HCC)    Parathyroid disorder [facility])    Past Surgical History:   Procedure Laterality Date   AV FISTULA PLACEMENT Left 05/24/2014   Procedure: BRACHIOCEPHALIC ARTERIOVENOUS (AV) FISTULA CREATION;  Surgeon: Dannis Dy, MD;  Location: [facility] OR;  Service: Vascular;  Laterality: Left;   AV FISTULA PLACEMENT Left 09/15/2023   Procedure: LEFT ARM ARTERIOVENOUS (AV) FISTULA REVISION AND PLICATION;  Surgeon: Young Hensen, MD;  Location: [facility] OR;  Service: Vascular;  Laterality: Left;   ESOPHAGOGASTRODUODENOSCOPY (EGD) WITH PROPOFOL  N/A 04/03/2020   Procedure: ESOPHAGOGASTRODUODENOSCOPY (EGD) WITH PROPOFOL ;  Surgeon: Genell Ken, MD;  Location: [facility] ENDOSCOPY;  Service: Gastroenterology;  Laterality: N/A;   PROSTATE BIOPSY N/A 11/27/2020   Procedure: BIOPSY TRANSRECTAL ULTRASONIC PROSTATE (TUBP);  Surgeon: Samson Croak, MD;  Location: [facility] ORS;  Service: Urology;  Laterality: N/A;  REQUESTING 7 MINS FOR CASE   REVISON OF ARTERIOVENOUS FISTULA Left 01/08/2021   Procedure: REVISON PLICATION OF ARTERIOVENOUS FISTULA LEFT;  Surgeon: Adine Hoof, MD;  Location: [facility] OR;  Service: Vascular;  Laterality: Left;   SURGICAL EXCISION OF EXCESSIVE SKIN Left 09/15/2023   Procedure: SURGICAL EXCISION OF EXCESSIVE SKIN, ULCERATIVE SKIN;  Surgeon: Young Hensen, MD;  Location: [facility] OR;  Service: Vascular;  Laterality: Left;   THROMBECTOMY W/ EMBOLECTOMY Left 09/15/2023   Procedure: THROMBECTOMY ARTERIOVENOUS FISTULA;  Surgeon: Young Hensen, MD;  Location: [facility] OR;  Service: Vascular;  Laterality: Left;   TRANSURETHRAL RESECTION OF BLADDER TUMOR N/A 11/27/2020   Procedure: TRANSURETHRAL RESECTION OF BLADDER TUMOR (TURBT);  Surgeon: Samson Croak, MD;  Location: [facility] ORS;  Service: Urology;  Laterality: N/A;  NEED PARALYSIS   Social History:  reports that he has never smoked. He has never been exposed to tobacco smoke. He has never used smokeless tobacco. He reports that he does not drink alcohol and does not use drugs.  No Known Allergies  Family  History  Problem Relation Age of Onset   Diabetes Mother    Hypertension Mother    Heart attack Mother 68   Stroke Father    Hypertension Father    Depression Father    Diabetes Brother    Hypertension Brother    Stroke Brother 16   Alcohol abuse Brother    Asthma Brother    Colon cancer Neg Hx    Esophageal cancer Neg Hx    Stomach cancer Neg Hx     Prior to Admission medications   Medication Sig Start Date End Date Taking? Authorizing Provider  atorvastatin  (LIPITOR) 40 MG tablet Take 1 tablet (40 mg total) by mouth daily. 05/25/23   Colin Dawley, MD  cinacalcet  (SENSIPAR ) 60 MG tablet Take 60 mg by mouth daily.    [provider]  guaiFENesin  (MUCINEX ) 600 MG 12 hr tablet Take 1 tablet (600 mg total) by mouth 2 (two) times daily as needed for cough or to loosen phlegm. 05/25/23   Colin Dawley, MD  guaifenesin  (ROBITUSSIN) 100 MG/5ML syrup Take 200 mg by mouth 3 (three) times daily as needed for cough.    [provider]  Methoxy PEG-Epoetin  Beta (MIRCERA IJ) Mircera 10/17/23 10/15/24  [provider]  metoprolol  tartrate (LOPRESSOR ) 25 MG tablet Take 0.5 tablets (12.5 mg total) by mouth 2 (two) times daily. 07/08/23   Jude Norton, NP  midodrine (PROAMATINE) 5 MG tablet Take 5 mg by mouth. Taking Monday, Wednesday and Friday at dialysis 10/07/23   [provider]  sevelamer  carbonate (RENVELA ) 800 MG tablet Take 2,400-3,200 mg by mouth 2 (two) times daily. 05/17/23   [provider]  terbinafine  (LAMISIL ) 250 MG tablet TAKE 1 TABLET BY MOUTH EVERY DAY 12/22/23   [place], Max T, [place]    Physical Exam:  Vitals:   01/16/24 1051 01/16/24 1245 01/16/24 1455 01/16/24 1526  BP: (!) 103/53 121/67 136/77 127/77  Pulse: (!) 119 (!) 106 (!) 108 (!) 106  Resp: 16 19 18 18   Temp: (!) 97.3 F (36.3 C)  97.6 F (36.4 C) 98.7 F (37.1 C)  TempSrc:   Oral Oral  SpO2: 99% 100% 100% 100%    GENERAL:  Alert, pleasant, no acute distress  HEENT:   EOMI CARDIOVASCULAR:  RRR, left AV fistula RESPIRATORY:  Clear to auscultation, no wheezing, rales, or rhonchi GASTROINTESTINAL:  Soft, nontender, nondistended EXTREMITIES: BL LE pitting edema NEURO:  No new focal deficits appreciated SKIN: Dry, no rashes noted PSYCH:  Appropriate mood and affect    Data Reviewed:  CT renal   IMPRESSION: *Findings consistent with right perirenal retroperitoneal hematoma with blood clots in the right renal pelvis and proximal ureter. *No obvious kidney stones are identified. *No significant change in the appearance of the left kidney with multiple cortical cysts and significant cortical atrophy as well as some dystrophic calcifications without evidence of lithiasis or hydronephrosis. *No significant change in the multiple hepatic cysts. *Minimal left lower lobe hypoventilatory atelectasis, subtle infiltrates? Minute pericardial prominence with perhaps minute amount of pericardial fluid.  Assessment and Plan:  Acute on chronic blood loss anemia -  Hemoglobin 5.1 on presentation, was 11 point 2/29/2025.  2 units PRBC initiated.  Trend hemoglobin.  Order iron studies.  Trend hemoglobin every 6 hours.  Acute retroperitoneal bleed - Noted incidentally on CT scan.  10 x 9 x 13 cm right perirenal retroperitoneal bleed with blood clots noted in the renal pelvis and proximal ureter.  Likely etiology of patient's hematuria given patient does not have urine output.  Etiology unclear however.  No need for surgical or procedural intervention at this time.  Continue to monitor hemoglobin as well as possibility of the repeating imaging to evaluate size.  Acute on chronic hematochezia - Appears to be subacute/chronic in nature.  Likely hemorrhoids given patient describes bright red blood in tissue paper removal.  Scheduled for colonoscopy 6/19 in the outpatient setting.  Will consult GI to possibly evaluate while inpatient.  Prostate and bladder cancer with  hematuria - Was empirically placed on cefpodoxime  for suspected cystitis in outpatient setting.  However CT scan noting retroperitoneal perinephric bleeding suggests source is more proximal.  Patient has no urine output.  Patient was to have transurethral resection of bladder tumor scheduled 6/11 in the outpatient setting.  Will place consult to urology to assess whether inpatient intervention is warranted.  ESRD on HD - Normal schedule Monday Wednesday Friday.  Nephrology consulted and following closely.  Left AV fistula in place.  Mild acute exacerbation of HFrEF/dilated cardiomyopathy - Previous echo showed recovered EF.  Mild volume overload, with lower extremity pitting edema.  Volume status to be managed by ultrafiltration/dialysis via nephrology.  Holding on beta-blocker for the time being.  obesity -BMI 37.   Chronic hypotension - Midodrine as needed with dialysis.  Holding low-dose metoprolol  for now.   Advance Care Planning:   Code Status: Full Code   Consults: Nephrology, gastroenterology, urology  Family Communication: None at bedside  Severity of Illness: The appropriate patient status for this patient is INPATIENT. Inpatient status is judged to be reasonable and necessary in order to provide the required intensity of service to ensure the patient's safety. The patient's presenting symptoms, physical exam findings, and initial radiographic and laboratory data in the context of their chronic comorbidities is felt to place them at high risk for further clinical deterioration. Furthermore, it is not anticipated that the patient will be medically stable for discharge from the hospital within 2 midnights of admission.   * I certify that at the point of admission it is my clinical judgment that the patient will require inpatient hospital care spanning beyond 2 midnights from the point of admission due to high intensity of service, high risk for further deterioration and high frequency  of surveillance required.*  Author: Jodeane Mulligan, DO 01/16/2024 4:03 PM  For on call review [email or web address].

## 2024-01-16 NOTE — Consult Note (Signed)
 I have been asked to see the patient by Dr. Pandora Bogaert, for evaluation and management of perinephric hematoma.  History of present illness: 58 year old patient with end-stage renal disease on dialysis with a additional comorbidity of nonmuscle invasive bladder cancer and high-grade prostate cancer.  He is followed and managed by [facility] urology.  He presented to the emergency department from dialysis who was unable to treat the patient because of a very low hemoglobin.  He was noted to have a hemoglobin of 5.1, he was otherwise relatively hemodynamically stable.  A CT scan was performed and demonstrated a large right peri-nephric hematoma.  Patient complaining of persistent gross hematuria over the last several weeks, despite being mostly oliguric.  He denies voiding clots.  He denies having any bladder pain or the feeling of fullness and inability to void.  He is also having some right-sided flank pain.  Fortunately the patient denies any fevers or chills.  The patient's pain has been present now for about a week.  He is not sure that it has progressed or worsened over that period of time.    Review of systems: A 12 point comprehensive review of systems was obtained and is negative unless otherwise stated in the history of present illness.  Patient Active Problem List   Diagnosis Date Noted   Retroperitoneal bleed 01/16/2024   ESRD (end stage renal disease) (HCC) 09/15/2023   Other skin changes 08/01/2023   Hematuria, unspecified 05/28/2023   SVT (supraventricular tachycardia) (HCC) 05/21/2023   Lower GI bleed 03/16/2023   Prostate cancer (HCC) 03/16/2023   Hyperkalemia 03/16/2023   GI bleed 03/16/2023   Bacterial infection, unspecified 01/31/2023   Disorder of phosphorus metabolism, unspecified 08/27/2022   Bladder cancer (HCC) 07/28/2021   HFrEF (heart failure with reduced ejection fraction) (HCC) 07/02/2021   Thyromegaly 07/02/2021   Fistula, other specified site  08/13/2020   COVID-19 05/19/2020   HTN (hypertension) with goal to be determined    Duodenitis    Acute on chronic blood loss anemia 04/02/2020   Upper GI bleed    Anaphylactic shock, unspecified, sequela 05/25/2019   Urinary tract infection, site not specified 04/02/2019   Secondary hyperparathyroidism of renal origin (HCC) 11/28/2018   Fluid overload, unspecified 03/26/2017   Dependence on renal dialysis (HCC) 10/19/2016   Encounter for immunization 10/15/2016   Cardiomyopathy, unspecified (HCC) 09/22/2016   Coagulation defect, unspecified (HCC) 09/22/2016   Iron deficiency anemia, unspecified 09/22/2016   Male erectile dysfunction, unspecified 09/22/2016   CKD (chronic kidney disease) 07/03/2014   ESRD (end stage renal disease) on dialysis (HCC) 08/08/2013   Chronic kidney disease (CKD), stage IV (severe) (HCC) 04/17/2013   Patient nonadherence 04/17/2013   Morbid obesity (HCC) 04/15/2013   Anemia 04/15/2013   Cardiomegaly 04/13/2013   Hypertensive emergency 04/13/2013   Acute on chronic combined systolic and diastolic CHF (congestive heart failure) (HCC) 04/13/2013    No current facility-administered medications on file prior to encounter.   Current Outpatient Medications on File Prior to Encounter  Medication Sig Dispense Refill   atorvastatin  (LIPITOR) 40 MG tablet Take 1 tablet (40 mg total) by mouth daily. 30 tablet 2   B Complex-C-Folic Acid (RENA-VITE RX) 1 MG TABS Take 1 tablet by mouth daily.     cefpodoxime  (VANTIN ) 200 MG tablet Take 1 tablet by mouth daily.     cinacalcet  (SENSIPAR ) 60 MG tablet Take 60 mg by mouth daily.     guaiFENesin  (MUCINEX ) 600 MG 12 hr tablet  Take 1 tablet (600 mg total) by mouth 2 (two) times daily as needed for cough or to loosen phlegm. 20 tablet 1   guaifenesin  (ROBITUSSIN) 100 MG/5ML syrup Take 200 mg by mouth 3 (three) times daily as needed for cough.     Methoxy PEG-Epoetin  Beta (MIRCERA IJ) Mircera     metoprolol  tartrate  (LOPRESSOR ) 25 MG tablet Take 0.5 tablets (12.5 mg total) by mouth 2 (two) times daily. 60 tablet 11   midodrine (PROAMATINE) 5 MG tablet Take 5 mg by mouth. Taking Monday, Wednesday and Friday at dialysis     sevelamer  carbonate (RENVELA ) 800 MG tablet Take 2,400-3,200 mg by mouth 2 (two) times daily.     B Complex-C-Folic Acid (B-COMPLEX/FOLIC ACID/VITAMIN C) TBCR Take 1 tablet by mouth daily. (Patient not taking: Reported on 01/16/2024)     terbinafine  (LAMISIL ) 250 MG tablet TAKE 1 TABLET BY MOUTH EVERY DAY (Patient not taking: Reported on 01/16/2024) 30 tablet 0    Past Medical History:  Diagnosis Date   Anemia    AV fistula (HCC)    Cancer (HCC)    prostate and bladder cancer - 2022 diagnosed   CHF (congestive heart failure) (HCC)    patient denies   ESRD on hemodialysis (HCC)    MWF [facility]   History of blood transfusion 03/2020   Hypertension    history of, no longer taking medication at this time   Morbid obesity (HCC)    Parathyroid disorder [facility])     Past Surgical History:  Procedure Laterality Date   AV FISTULA PLACEMENT Left 05/24/2014   Procedure: BRACHIOCEPHALIC ARTERIOVENOUS (AV) FISTULA CREATION;  Surgeon: Dannis Dy, MD;  Location: [facility] OR;  Service: Vascular;  Laterality: Left;   AV FISTULA PLACEMENT Left 09/15/2023   Procedure: LEFT ARM ARTERIOVENOUS (AV) FISTULA REVISION AND PLICATION;  Surgeon: Young Hensen, MD;  Location: [facility] OR;  Service: Vascular;  Laterality: Left;   ESOPHAGOGASTRODUODENOSCOPY (EGD) WITH PROPOFOL  N/A 04/03/2020   Procedure: ESOPHAGOGASTRODUODENOSCOPY (EGD) WITH PROPOFOL ;  Surgeon: Genell Ken, MD;  Location: [facility] ENDOSCOPY;  Service: Gastroenterology;  Laterality: N/A;   PROSTATE BIOPSY N/A 11/27/2020   Procedure: BIOPSY TRANSRECTAL ULTRASONIC PROSTATE (TUBP);  Surgeon: Samson Croak, MD;  Location: [facility] ORS;  Service: Urology;  Laterality: N/A;  REQUESTING 7 MINS FOR CASE   REVISON OF ARTERIOVENOUS FISTULA Left 01/08/2021    Procedure: REVISON PLICATION OF ARTERIOVENOUS FISTULA LEFT;  Surgeon: Adine Hoof, MD;  Location: [facility] OR;  Service: Vascular;  Laterality: Left;   SURGICAL EXCISION OF EXCESSIVE SKIN Left 09/15/2023   Procedure: SURGICAL EXCISION OF EXCESSIVE SKIN, ULCERATIVE SKIN;  Surgeon: Young Hensen, MD;  Location: [facility] OR;  Service: Vascular;  Laterality: Left;   THROMBECTOMY W/ EMBOLECTOMY Left 09/15/2023   Procedure: THROMBECTOMY ARTERIOVENOUS FISTULA;  Surgeon: Young Hensen, MD;  Location: [facility] OR;  Service: Vascular;  Laterality: Left;   TRANSURETHRAL RESECTION OF BLADDER TUMOR N/A 11/27/2020   Procedure: TRANSURETHRAL RESECTION OF BLADDER TUMOR (TURBT);  Surgeon: Samson Croak, MD;  Location: [facility] ORS;  Service: Urology;  Laterality: N/A;  NEED PARALYSIS    Social History   Tobacco Use   Smoking status: Never    Passive exposure: Never   Smokeless tobacco: Never  Vaping Use   Vaping status: Never Used  Substance Use Topics   Alcohol use: No   Drug use: No    Family History  Problem Relation Age of Onset   Diabetes Mother    Hypertension  Mother    Heart attack Mother 68   Stroke Father    Hypertension Father    Depression Father    Diabetes Brother    Hypertension Brother    Stroke Brother 50   Alcohol abuse Brother    Asthma Brother    Colon cancer Neg Hx    Esophageal cancer Neg Hx    Stomach cancer Neg Hx     PE: Vitals:   01/16/24 1455 01/16/24 1526 01/16/24 1622 01/16/24 1716  BP: 136/77 127/77 130/73 136/77  Pulse: (!) 108 (!) 106 (!) 105 (!) 107  Resp: 18 18 14 17   Temp: 97.6 F (36.4 C) 98.7 F (37.1 C) 98 F (36.7 C) 98.1 F (36.7 C)  TempSrc: Oral Oral Oral Oral  SpO2: 100% 100% 100% 100%   Patient appears to be in no acute distress  patient is alert and oriented x3 Atraumatic normocephalic head No cervical or supraclavicular lymphadenopathy appreciated No increased work of breathing, no audible wheezes/rhonchi Regular sinus  rhythm/rate Abdomen is soft, nontender, nondistended, right CVA tenderness Lower extremities are symmetric without appreciable edema Grossly neurologically intact No identifiable skin lesions  Recent Labs    01/16/24 1130  WBC 8.9  HGB 5.1*  HCT 16.4*   Recent Labs    01/16/24 1130  NA 138  K 4.2  CL 93*  CO2 28  GLUCOSE 105*  BUN 88*  CREATININE 15.56*  CALCIUM  8.8*   No results for input(s): "LABPT", "INR" in the last 72 hours. No results for input(s): "LABURIN" in the last 72 hours. Results for orders placed or performed during the hospital encounter of 09/15/23  Surgical pcr screen     Status: Abnormal   Collection Time: 09/15/23 12:18 PM   Specimen: Nasal Mucosa; Nasal Swab  Result Value Ref Range Status   MRSA, PCR POSITIVE (A) NEGATIVE Final    Comment: RESULT CALLED TO, READ BACK BY AND VERIFIED WITH: EMAILED LISA HODGIN ON 09/15/23 @ 1753 BY DRT    Staphylococcus aureus POSITIVE (A) NEGATIVE Final    Comment: (NOTE) The Xpert SA Assay (FDA approved for NASAL specimens in patients 34 years of age and older), is one component of a comprehensive surveillance program. It is not intended to diagnose infection nor to guide or monitor treatment. Performed at [facility], [address] [place], [place] [address]     Imaging: I reviewed the patient's CT scan which demonstrates a large retroperitoneal hematoma, and hydronephrosis with clot within the right renal pelvis.  Imp: Large right-sided perinephric hematoma -patient otherwise seems to be relatively hemodynamically stable.  Recommendations: Recommend the patient be placed on bedrest with every 6 hemoglobins.  He needs to be transfused back to reasonable number.  If his hemoglobin continues to dwindle, he will need a CT angiogram, and potential IR embolization.  Would also consider administering TXA 1000 mg I x 1.  Thank you for involving me in this patient's care, I will continue to follow  along.   Andrez Banker

## 2024-01-16 NOTE — ED Notes (Signed)
 Pt to CT

## 2024-01-16 NOTE — ED Notes (Signed)
Pt provided "ED Happy Meal"

## 2024-01-16 NOTE — Consult Note (Signed)
 Renal Service Consult Note [facility]  Devin Bullock 01/16/2024 Lynae Sandifer, MD Requesting Physician: Dr. Macarthur Savory  Reason for Consult: ESRD pt w/ acute on chronic anemia HPI: The patient is a 58 y.o. year-old w/ PMH as below who presented to the ED from home after being told not to come to dialysis because his lab work from his last treatment showed very low hemoglobin.  In the ED BP 103/53, HR 106, RR 19, temp 97, 100% on room air.  Creatinine 15, BUN 88, K+ 4.2, Hgb 5.1, WBC 8k.  CT renal stone study of abdomen showed findings consistent with right perirenal retroperitoneal hematoma with blood clots in the right renal pelvis and proximal ureter.  Patient stated his hemoglobin at baseline was over 10.  It also said he was having blood in his stool and in his urine, and that he is scheduled for both a colonoscopy and a urologic procedure.  He has been having some pain on the right side of his abdomen.  Patient was admitted and is getting transfusion of PRBCs, we are asked to see for dialysis.   Pt seen in ED room.  Patient is awake and alert and coherent, blood pressures are good 120/80, he is not on any oxygen denies any shortness of breath chest pain.  No fevers, chills or sweats.  As above he reports blood in stool and urine, not sure of the details on this.  He states that his last dialysis on Friday he did not have any fluid on and they just "cleaned my blood".   ROS - denies CP, no joint pain, no HA, no blurry vision, no rash, no diarrhea, no nausea/ vomiting  PMH: Anemia History of prostate cancer History of bladder cancer ESRD on hemodialysis Hypertension Morbid obesity  Past Surgical History  Past Surgical History:  Procedure Laterality Date   AV FISTULA PLACEMENT Left 05/24/2014   Procedure: BRACHIOCEPHALIC ARTERIOVENOUS (AV) FISTULA CREATION;  Surgeon: Dannis Dy, MD;  Location: [facility] OR;  Service: Vascular;  Laterality: Left;   AV FISTULA PLACEMENT  Left 09/15/2023   Procedure: LEFT ARM ARTERIOVENOUS (AV) FISTULA REVISION AND PLICATION;  Surgeon: Young Hensen, MD;  Location: [facility] OR;  Service: Vascular;  Laterality: Left;   ESOPHAGOGASTRODUODENOSCOPY (EGD) WITH PROPOFOL  N/A 04/03/2020   Procedure: ESOPHAGOGASTRODUODENOSCOPY (EGD) WITH PROPOFOL ;  Surgeon: Genell Ken, MD;  Location: [facility] ENDOSCOPY;  Service: Gastroenterology;  Laterality: N/A;   PROSTATE BIOPSY N/A 11/27/2020   Procedure: BIOPSY TRANSRECTAL ULTRASONIC PROSTATE (TUBP);  Surgeon: Samson Croak, MD;  Location: [facility] ORS;  Service: Urology;  Laterality: N/A;  REQUESTING 50 MINS FOR CASE   REVISON OF ARTERIOVENOUS FISTULA Left 01/08/2021   Procedure: REVISON PLICATION OF ARTERIOVENOUS FISTULA LEFT;  Surgeon: Adine Hoof, MD;  Location[facility] OR;  Service: Vascular;  Laterality: Left;   SURGICAL EXCISION OF EXCESSIVE SKIN Left 09/15/2023   Procedure: SURGICAL EXCISION OF EXCESSIVE SKIN, ULCERATIVE SKIN;  Surgeon: Young Hensen, MD;  Location: [facility] OR;  Service: Vascular;  Laterality: Left;   THROMBECTOMY W/ EMBOLECTOMY Left 09/15/2023   Procedure: THROMBECTOMY ARTERIOVENOUS FISTULA;  Surgeon: Young Hensen, MD;  Location: [facility] OR;  Service: Vascular;  Laterality: Left;   TRANSURETHRAL RESECTION OF BLADDER TUMOR N/A 11/27/2020   Procedure: TRANSURETHRAL RESECTION OF BLADDER TUMOR (TURBT);  Surgeon: Samson Croak, MD;  Location: [facility] ORS;  Service: Urology;  Laterality: N/A;  NEED PARALYSIS   Family History  Family History  Problem Relation Age of  Onset   Diabetes Mother    Hypertension Mother    Heart attack Mother 54   Stroke Father    Hypertension Father    Depression Father    Diabetes Brother    Hypertension Brother    Stroke Brother 55   Alcohol abuse Brother    Asthma Brother    Colon cancer Neg Hx    Esophageal cancer Neg Hx    Stomach cancer Neg Hx    Social History  reports that he has never smoked. He has never been exposed to tobacco  smoke. He has never used smokeless tobacco. He reports that he does not drink alcohol and does not use drugs. Allergies No Known Allergies Home medications Prior to Admission medications   Medication Sig Start Date End Date Taking? Authorizing Provider  atorvastatin  (LIPITOR) 40 MG tablet Take 1 tablet (40 mg total) by mouth daily. 05/25/23   Colin Dawley, MD  cinacalcet  (SENSIPAR ) 60 MG tablet Take 60 mg by mouth daily.    [provider]  guaiFENesin  (MUCINEX ) 600 MG 12 hr tablet Take 1 tablet (600 mg total) by mouth 2 (two) times daily as needed for cough or to loosen phlegm. 05/25/23   Colin Dawley, MD  guaifenesin  (ROBITUSSIN) 100 MG/5ML syrup Take 200 mg by mouth 3 (three) times daily as needed for cough.    [provider]  Methoxy PEG-Epoetin  Beta (MIRCERA IJ) Mircera 10/17/23 10/15/24  [provider]  metoprolol  tartrate (LOPRESSOR ) 25 MG tablet Take 0.5 tablets (12.5 mg total) by mouth 2 (two) times daily. 07/08/23   Jude Norton, NP  midodrine (PROAMATINE) 5 MG tablet Take 5 mg by mouth. Taking Monday, Wednesday and Friday at dialysis 10/07/23   [provider]  sevelamer  carbonate (RENVELA ) 800 MG tablet Take 2,400-3,200 mg by mouth 2 (two) times daily. 05/17/23   [provider]  terbinafine  (LAMISIL ) 250 MG tablet TAKE 1 TABLET BY MOUTH EVERY DAY 12/22/23   Hyatt, Max T, [place]     Vitals:   01/16/24 1051 01/16/24 1245 01/16/24 1455 01/16/24 1526  BP: (!) 103/53 121/67 136/77 127/77  Pulse: (!) 119 (!) 106 (!) 108 (!) 106  Resp: 16 19 18 18   Temp: (!) 97.3 F (36.3 C)  97.6 F (36.4 C) 98.7 F (37.1 C)  TempSrc:   Oral Oral  SpO2: 99% 100% 100% 100%   Exam Gen alert, no distress, obese No rash, cyanosis or gangrene Sclera anicteric, throat clear  No jvd or bruits Chest clear bilat to bases, no rales/ wheezing RRR no MRG Abd soft ntnd no mass or ascites +bs GU defer MS no joint effusions or deformity Ext trace-1+ bilat  pretib edema Neuro is alert, Ox 3 , nf    LFA AVF+bruit       Renal-related home meds: Lopressor  12.5 bid Midodrine 5mg  mwf pre HD Renvela  2-3 ac tid Sensipar  60mg  qd Others: lipitor, lamisil     OP HD: [facility] M[facility] 4.5h   B450   137.5kg  2K bath   AVF  Heparin  none Missed 3 treatments in May     Assessment/ Plan: Acute on chronic anemia: Hb 5.1 here, last Hb in April was 11.2. CT scan shows R peri-nephrix RP bleed. Pt states he is having blood in his stool and urine as well, and is supposed to have a colonoscopy and also a urologic procedure done in the next couple of weeks. Not on any blood thinners. Getting prbc's now.  ESRD: on HD  MWF. Missed Wed HD last week. Not in distress but will need more prbc's most likely. Will prioritize for HD early this evening.  BP: bp's wnl here, takes midodrine 5 mg pre HD, cont here. Will dc metoprolol  given normal BP's, acute bleeding, need for BP cushion to do HD. Have d/w pmd.  Volume: trace LE edema, lungs clear, didn't have "anything on" at his HD last Friday per the pt. Doesn't appear vol overloaded. UF goal 2-2.5 L.  Anemia of esrd: get records  Secondary hyperparathyroidism: CCa in range, add on phos, cont binders if eating meals.    Larry Poag  MD [facility] 01/16/2024, 4:15 PM  Recent Labs  Lab 01/16/24 1130  HGB 5.1*  ALBUMIN  2.7*  CALCIUM  8.8*  CREATININE 15.56*  K 4.2   Inpatient medications:  sodium chloride    Intravenous Once   [START ON 01/17/2024] atorvastatin   40 mg Oral Daily   [START ON 01/17/2024] Chlorhexidine  Gluconate Cloth  6 each Topical Q0600   [START ON 01/17/2024] cinacalcet   60 mg Oral Daily   [START ON 01/18/2024] midodrine  5 mg Oral Q M,W,F-HD   sevelamer  carbonate  3,200 mg Oral TID AC

## 2024-01-17 DIAGNOSIS — K863 Pseudocyst of pancreas: Secondary | ICD-10-CM

## 2024-01-17 DIAGNOSIS — R319 Hematuria, unspecified: Secondary | ICD-10-CM | POA: Diagnosis not present

## 2024-01-17 DIAGNOSIS — D62 Acute posthemorrhagic anemia: Secondary | ICD-10-CM | POA: Diagnosis not present

## 2024-01-17 LAB — CBC
HCT: 20.6 % — ABNORMAL LOW (ref 39.0–52.0)
Hemoglobin: 6.8 g/dL — CL (ref 13.0–17.0)
MCH: 31.3 pg (ref 26.0–34.0)
MCHC: 33 g/dL (ref 30.0–36.0)
MCV: 94.9 fL (ref 80.0–100.0)
Platelets: 342 10*3/uL (ref 150–400)
RBC: 2.17 MIL/uL — ABNORMAL LOW (ref 4.22–5.81)
RDW: 17.8 % — ABNORMAL HIGH (ref 11.5–15.5)
WBC: 8.3 10*3/uL (ref 4.0–10.5)
nRBC: 0 % (ref 0.0–0.2)

## 2024-01-17 LAB — COMPREHENSIVE METABOLIC PANEL WITH GFR
ALT: 16 U/L (ref 0–44)
AST: 19 U/L (ref 15–41)
Albumin: 2.4 g/dL — ABNORMAL LOW (ref 3.5–5.0)
Alkaline Phosphatase: 55 U/L (ref 38–126)
Anion gap: 19 — ABNORMAL HIGH (ref 5–15)
BUN: 94 mg/dL — ABNORMAL HIGH (ref 6–20)
CO2: 24 mmol/L (ref 22–32)
Calcium: 9.1 mg/dL (ref 8.9–10.3)
Chloride: 93 mmol/L — ABNORMAL LOW (ref 98–111)
Creatinine, Ser: 16.77 mg/dL — ABNORMAL HIGH (ref 0.61–1.24)
GFR, Estimated: 3 mL/min — ABNORMAL LOW (ref >60)
Glucose, Bld: 134 mg/dL — ABNORMAL HIGH (ref 70–99)
Potassium: 4.3 mmol/L (ref 3.5–5.1)
Sodium: 136 mmol/L (ref 135–145)
Total Bilirubin: 0.7 mg/dL (ref 0.0–1.2)
Total Protein: 6.1 g/dL — ABNORMAL LOW (ref 6.5–8.1)

## 2024-01-17 LAB — IRON AND TIBC
Iron: 41 ug/dL — ABNORMAL LOW (ref 45–182)
Saturation Ratios: 23 % (ref 17.9–39.5)
TIBC: 175 ug/dL — ABNORMAL LOW (ref 250–450)
UIBC: 134 ug/dL

## 2024-01-17 LAB — HEMOGLOBIN AND HEMATOCRIT, BLOOD
HCT: 19.5 % — ABNORMAL LOW (ref 39.0–52.0)
HCT: 25.5 % — ABNORMAL LOW (ref 39.0–52.0)
Hemoglobin: 6.2 g/dL — CL (ref 13.0–17.0)
Hemoglobin: 8.5 g/dL — ABNORMAL LOW (ref 13.0–17.0)

## 2024-01-17 LAB — PROTIME-INR
INR: 1.4 — ABNORMAL HIGH (ref 0.8–1.2)
INR: 1.2 (ref 0.8–1.2)
Prothrombin Time: 17.6 s — ABNORMAL HIGH (ref 11.4–15.2)
Prothrombin Time: 15 s (ref 11.4–15.2)

## 2024-01-17 LAB — PREPARE RBC (CROSSMATCH)

## 2024-01-17 LAB — APTT: aPTT: 41 s — ABNORMAL HIGH (ref 24–36)

## 2024-01-17 LAB — FERRITIN: Ferritin: 1795 ng/mL — ABNORMAL HIGH (ref 24–336)

## 2024-01-17 MED ORDER — MIDODRINE HCL 5 MG PO TABS
5.0000 mg | ORAL_TABLET | Freq: Once | ORAL | Status: AC
  Administered 2024-01-17: 5 mg via ORAL
  Filled 2024-01-17: qty 1

## 2024-01-17 MED ORDER — PENTAFLUOROPROP-TETRAFLUOROETH EX AERO
1.0000 | INHALATION_SPRAY | CUTANEOUS | Status: DC | PRN
Start: 2024-01-17 — End: 2024-01-17

## 2024-01-17 MED ORDER — HEPARIN SODIUM (PORCINE) 1000 UNIT/ML DIALYSIS: 1000.0000 [IU] | INTRAMUSCULAR | Status: DC | PRN

## 2024-01-17 MED ORDER — NEPRO/CARBSTEADY PO LIQD: 237.0000 mL | ORAL | Status: DC | PRN

## 2024-01-17 MED ORDER — TRANEXAMIC ACID-NACL 1000-0.7 MG/100ML-% IV SOLN
1000.0000 mg | Freq: Once | INTRAVENOUS | Status: AC
  Administered 2024-01-17: 1000 mg via INTRAVENOUS
  Filled 2024-01-17: qty 100

## 2024-01-17 MED ORDER — ALTEPLASE 2 MG IJ SOLR: 2.0000 mg | Freq: Once | INTRAMUSCULAR | Status: DC | PRN

## 2024-01-17 MED ORDER — LIDOCAINE-PRILOCAINE 2.5-2.5 % EX CREA: 1.0000 | TOPICAL_CREAM | CUTANEOUS | Status: DC | PRN

## 2024-01-17 MED ORDER — CHLORHEXIDINE GLUCONATE CLOTH 2 % EX PADS
6.0000 | MEDICATED_PAD | Freq: Every day | CUTANEOUS | Status: DC
  Administered 2024-01-17 – 2024-01-19 (×2): 6 via TOPICAL

## 2024-01-17 MED ORDER — SODIUM CHLORIDE 0.9% IV SOLUTION: Freq: Once | INTRAVENOUS | Status: AC

## 2024-01-17 MED ORDER — ANTICOAGULANT SODIUM CITRATE 4% (200MG/5ML) IV SOLN
5.0000 mL | Status: DC | PRN
Start: 2024-01-17 — End: 2024-01-17

## 2024-01-17 MED ORDER — SODIUM CHLORIDE 0.9 % IV SOLN
20.0000 ug | Freq: Once | INTRAVENOUS | Status: AC
  Administered 2024-01-17: 20 ug via INTRAVENOUS
  Filled 2024-01-17: qty 5

## 2024-01-17 MED ORDER — LIDOCAINE HCL (PF) 1 % IJ SOLN: 5.0000 mL | INTRAMUSCULAR | Status: DC | PRN

## 2024-01-17 NOTE — Progress Notes (Signed)
 [facility] Progress Note  Subjective:  Seen in HD unit Pre HD Hb was 6.8 2u prbc's ordered w/ HD, will give  Vitals:   01/17/24 1313 01/17/24 1359 01/17/24 1400 01/17/24 1415  BP: 122/77 127/69 127/69 123/66  Pulse: (!) 101 (!) 103 (!) 102 (!) 103  Resp: (!) 31 (!) 23 (!) 24 (!) 22  Temp: 97.9 F (36.6 C) 98.2 F (36.8 C) 98.2 F (36.8 C) 98.2 F (36.8 C)  TempSrc:  Oral Oral Oral  SpO2: 95%  93% 92%  Weight: (!) 137 kg     Height:        Exam: Gen alert, no distress, obese No rash, cyanosis or gangrene Sclera anicteric, throat clear  No jvd or bruits Chest clear bilat to bases, no rales/ wheezing RRR no MRG Abd soft ntnd no mass or ascites +bs GU defer MS no joint effusions or deformity Ext trace-1+ bilat pretib edema Neuro is alert, Ox 3 , nf    LFA AVF+bruit           Renal-related home meds: Lopressor  12.5 bid Midodrine 5mg  mwf pre HD Renvela  2-3 ac tid Sensipar  60mg  qd Others: lipitor, lamisil       OP HD: [facility] MWF 4.5h   B450   137.5kg  2K bath   AVF  Heparin  none Missed 3 treatments in May         Assessment/ Plan: Acute on chronic anemia: Hb 5.1 here on admission. Last Hb in April was 11.2. CT scan shows R perinephric RP bleed. Had 2u prbc's yest, and Hb 6.8 pre-hd this am. Supposed to get 2 more units or prbc's w/ HD this am.   ESRD: on HD MWF. HD today off schedule. Next HD tomorrow.  BP: bp's wnl here, takes midodrine 5 mg pre HD, cont here. Metoprolol  was dc'd given normal BP's and acute bleeding.  Volume: trace LE edema, lungs clear, BP's stable w/ HD and 3 L UF this am. Post-hd wt is at dry wt.  Anemia of esrd: get records  Secondary hyperparathyroidism: CCa in range, phos slightly up, cont binders if eating meals.     Larry Poag MD  [facility] 01/17/2024, 3:09 PM  Recent Labs  Lab 01/16/24 1130 01/16/24 1317 01/17/24 0202 01/17/24 0845  HGB 5.1*  --  6.2* 6.8*  ALBUMIN  2.7*  --   --  2.4*  CALCIUM  8.8*  --   --  9.1   PHOS  --  6.7*  --   --   CREATININE 15.56*  --   --  16.77*  K 4.2  --   --  4.3   Recent Labs  Lab 01/17/24 0202  IRON 41*  TIBC 175*  FERRITIN 1,795*   Inpatient medications:  atorvastatin   40 mg Oral Daily   Chlorhexidine  Gluconate Cloth  6 each Topical Q0600   cinacalcet   60 mg Oral Daily   [START ON 01/18/2024] midodrine  5 mg Oral Q M,W,F-HD   mupirocin  ointment  1 Application Nasal BID   sevelamer  carbonate  3,200 mg Oral TID AC    desmopressin (DDAVP) 20 mcg in sodium chloride  0.9 % 50 mL IVPB     tranexamic acid     guaiFENesin -dextromethorphan

## 2024-01-17 NOTE — Progress Notes (Addendum)
 [facility] night cross cover note:   I was notified by the patient's RN of the patient's hematuria.  Per chart review, urology aware, and have formally consulted on this pt.  The patient is currently receiving transfusion of 2 units PRBC, with posttransfusion H&H to occur around 2 AM.  Following this, he has existing orders for every 6 hours H&H trending.  Following initiation of PRBC transfusion, mild sinus tachycardia has improved, with HR's  now in the 90s.  Additionally, most recent blood pressure 127/73, with additional vital signs appearing stable at this time, including afebrile, respiratory rate 20, oxygen saturations in the high 90s on room air. Will follow for result of post- transfusion hemoglobin level, along with further trending of vital signs.   Update: post transfusion Hgb has increased to 6.2 around 2 AM, which is up from 5.1 around 1130 AM on 6/2. VS continue to appear stable, including afebrile, HR's in the 90's, most recent SBP's in the 120's, RR in the low 20's, with O2 sats in the mid to high 90's on RA. He has ESRD on HD and was not felt to be volume overloaded last evening per nephrology evaluation at that time. HD was originally considered for last evening, although this has been re-timed for the morning. At the present time, will proceed with transfusion of one additional unit prbc over 3 hours, following which I have also placed order for post-transfusion H&H to be checked.    Camelia Cavalier, DO Hospitalist

## 2024-01-17 NOTE — Progress Notes (Signed)
 Patient urine sample sent per order. Patient voided 20 cc of bloody urine. Patient MRSA  nare screen came back positive.MRSA  protocol started. Dr. Brock Canner made aware. Will continue with current plan of care.

## 2024-01-17 NOTE — Progress Notes (Signed)
 Inpatient Progress Note     Patient Profile/Chief Complaint  58 year old gentleman with ESRD on HD admitted with anemia, rectal bleeding, hematuria and large right perirenal retroperitoneal hematoma   Interval History   -- States his back pain feels slightly better -- No bowel movement since hospital admission-denies rectal bleeding while inpatient -- Hemoglobin remains low at 6.8 -has received 5 units PRBCs    Objective   Vital signs in last 24 hours: Temp:  [97.9 F (36.6 C)-98.7 F (37.1 C)] 98.2 F (36.8 C) (06/03 1415) Pulse Rate:  [96-107] 103 (06/03 1415) Resp:  [14-35] 22 (06/03 1415) BP: (96-136)/(52-94) 123/66 (06/03 1415) SpO2:  [92 %-100 %] 92 % (06/03 1415) Weight:  [562 kg-140.2 kg] 137 kg (06/03 1313) Last BM Date : 01/14/24 General:   Resting quietly in bed no distress Heart:  Regular rate and rhythm; no murmurs Lungs: Respirations even and unlabored, lungs CTA bilaterally Abdomen:  Soft, nontender and nondistended. Normal bowel sounds. Extremities:  Without edema. Neurologic:  Alert and oriented,  grossly normal neurologically. Psych:  Cooperative. Normal mood and affect.  Intake/Output from previous day: 06/02 0701 - 06/03 0700 In: 963 [Blood:963] Out: 30 [Urine:30] Intake/Output this shift: Total I/O In: 464.5 [Blood:464.5] Out: 3000 [Other:3000]  Lab Results: Recent Labs    01/16/24 1130 01/17/24 0202 01/17/24 0845  WBC 8.9  --  8.3  HGB 5.1* 6.2* 6.8*  HCT 16.4* 19.5* 20.6*  PLT 419*  --  342   BMET Recent Labs    01/16/24 1130 01/17/24 0845  NA 138 136  K 4.2 4.3  CL 93* 93*  CO2 28 24  GLUCOSE 105* 134*  BUN 88* 94*  CREATININE 15.56* 16.77*  CALCIUM  8.8* 9.1   LFT Recent Labs    01/17/24 0845  PROT 6.1*  ALBUMIN  2.4*  AST 19  ALT 16  ALKPHOS 55  BILITOT 0.7   PT/INR Recent Labs    01/17/24 0202  LABPROT 17.6*  INR 1.4*    Studies/Results: CT Renal Stone Study Result Date: 01/16/2024 CLINICAL DATA:   Abdominal pain flank pain EXAM: CT ABDOMEN AND PELVIS WITHOUT CONTRAST TECHNIQUE: Multidetector CT imaging of the abdomen and pelvis was performed following the standard protocol without IV contrast. RADIATION DOSE REDUCTION: This exam was performed according to the departmental dose-optimization program which includes automated exposure control, adjustment of the mA and/or kV according to patient size and/or use of iterative reconstruction technique. COMPARISON:  December 13, 2023 FINDINGS: Lower chest: Minimal left lower lobe hypoventilatory atelectasis, subtle infiltrates? Correlate clinically. No consolidations. Minute pericardial prominence with perhaps minute amount of pericardial fluid. Hepatobiliary: Comparison with prior examinations demonstrates no significant change in the multiple hepatic cysts throughout the right lobe of the liver. No new findings. Pancreas: Unremarkable. No pancreatic ductal dilatation or surrounding inflammatory changes. Spleen: Normal in size without focal abnormality. Adrenals/Urinary Tract: Comparison with prior examination accounting for differences in techniques the right kidney appears larger than on prior examination. High areas of attenuation within the mid and lower pole calices and renal pelvis with a dilated right proximal ureter to the iliac vessel crossing are consistent with hematuria and blood clots. No obvious kidney stones are identified in the trajectory of the ureter. There is stranding surrounding the right kidney and the perirenal fascia as well as a poorly organized retroperitoneal density that measures 10 x 8.9 by 13 cm in the AP, transverse and longitudinal diameter consistent with retroperitoneal hematoma. Findings are consistent with the perirenal retroperitoneal  hematoma. No obvious masses are identified. Small calcific densities are noted surrounding the renal pelvis which are seen on the prior study and correlate perhaps with calcifications of some vascular  structures. Comparison with prior examinations demonstrates no significant change in the appearance of the left kidney with multiple cortical cysts and significant cortical atrophy as well as some dystrophic calcifications without evidence of lithiasis or hydronephrosis. Stomach/Bowel: Stomach is within normal limits. Appendix appears normal. No evidence of bowel wall thickening, distention, or inflammatory changes. Vascular/Lymphatic: No significant vascular findings are present. No enlarged abdominal or pelvic lymph nodes. Reproductive: Prostate is unremarkable. Other: No abdominal wall hernia or abnormality. No abdominopelvic ascites. Musculoskeletal: No acute or significant osseous findings. IMPRESSION: *Findings consistent with right perirenal retroperitoneal hematoma with blood clots in the right renal pelvis and proximal ureter. *No obvious kidney stones are identified. *No significant change in the appearance of the left kidney with multiple cortical cysts and significant cortical atrophy as well as some dystrophic calcifications without evidence of lithiasis or hydronephrosis. *No significant change in the multiple hepatic cysts. *Minimal left lower lobe hypoventilatory atelectasis, subtle infiltrates? Minute pericardial prominence with perhaps minute amount of pericardial fluid. Electronically Signed   By: Fredrich Jefferson M.D.   On: 01/16/2024 12:49    Endoscopic Studies: None this admission   Clinical Impression   Devin Bullock is a 58 year old gentleman with ESRD on HD admitted with anemia, rectal bleeding, hematuria and large right perirenal retroperitoneal hematoma.  Patient reports having a bladder lesion that was unable to be removed during prior cystoscopy -being followed by outside urology.  Since being admitted to the hospital he has not had a bowel movement or rectal bleeding.  Anemia and bleeding related to his current clinical picture appears to be primarily due to hematuria and right perirenal  hematoma.  Remains hemodynamically stable but has received 5 units of packed red blood cells without appropriate increase in hemoglobin.   Plan  No plan for GI procedures at this time as patient's bleeding appears to be related to kidney/urologic tract. Per urology if bleeding continues they recommend a CT angiogram with potential IR embolization Continue to monitor serial H&H If there is evidence of overt GI bleeding please notify GI team.    LOS: 1 day   Truddie Furrow  01/17/2024, 3:05 PM  Eugenia Hess, MD [facility]

## 2024-01-17 NOTE — Progress Notes (Signed)
 PROGRESS NOTE    Devin Bullock  ZOX:[id number] DOB: 02-06-1966 DOA: 01/16/2024 PCP: Artie Laster, MD   Chief Complaint  Patient presents with   Low Hgb   Dialysis pt   Shortness of Breath    Brief Narrative:  Devin Bullock is a 58 y.o. male with history of ESRD (MWF), HFrecEF, rectal bleed, anemia, morbid obesity, prostate and bladder cancer with recent partial resection 11/2023 presenting to ED after being notified by dialysis center that his hemoglobin was too low for procedure.  Patient with bright red blood per rectum, but this is more of chronic ongoing problem for few months, as well he does describe urine/p.o. blood coming from his penis, he is anuric , that has been occurring ever since cystoscopy and partial bladder malignancy removal less than 2 months ago.  In ED workup significant for hemoglobin 5.1.   creatinine 15.56.  CT scan noting 10 x 9 x 13 cm right sided perirenal retroperitoneal hematoma with blood clots in the right renal pelvis and proximal ureter.   Assessment & Plan:   Principal Problem:   Acute on chronic blood loss anemia Active Problems:   Acute on chronic combined systolic and diastolic CHF (congestive heart failure) (HCC)   Morbid obesity (HCC)   ESRD (end stage renal disease) on dialysis [facility])   GI bleed   Retroperitoneal bleed   Retroperitoneal hematoma   Hematochezia   Acute on chronic blood loss anemia - Hemoglobin 5.1 on presentation, down from 11 on 2/29/2025 . - Secondary from acute blood loss anemia, in the setting of right perinephric hematoma. - Urology consulted, - Received desmopressin, will give TXA as well. - Continue with supportive transfusion, hemoglobin up to 6.8 unit after 3 units, 2 units PRBC transfusions are pending, monitor CBC closely and transfuse as needed.    Acute on chronic hematochezia - Appears to be subacute/chronic in nature.  - GI consulted, he is scheduled for colonoscopy 6/19 in outpatient setting, source of  bleeding felt more likely due to urological origin, no urgent indication for colonoscopy, but it can be performed if major source of bleeding felt secondary to GI    Prostate and bladder cancer with hematuria - Was empirically placed on cefpodoxime  for suspected cystitis in outpatient setting.  However CT scan noting retroperitoneal perinephric bleeding suggests source is more proximal.  Patient has no urine output.  Patient was to have transurethral resection of bladder tumor scheduled 6/11 in the outpatient setting.  Will place consult to urology to assess whether inpatient intervention is warranted.   ESRD on HD - Normal schedule Monday Wednesday Friday.  Nephrology consulted for HD this morning.  Left AV fistula in place.   Mild acute exacerbation of HFrEF/dilated cardiomyopathy - Previous echo showed recovered EF.  Mild volume overload, with lower extremity pitting edema.  Volume status to be managed by ultrafiltration/dialysis via nephrology.  Holding on beta-blocker for the time being. -Volume management with dialysis   obesity -BMI 37.    Chronic hypotension - Midodrine as needed with dialysis.  Holding low-dose metoprolol  for now.  DVT prophylaxis: SCD's Code Status: Full Family Communication: none at bedside Disposition:      Consultants:  Urology Nephrology Gastroenterology  Subjective: Patient received 3 units overnight, he reports hematuria, Objective: Vitals:   01/17/24 1239 01/17/24 1300 01/17/24 1306 01/17/24 1313  BP: (!) 121/94 113/83 106/75 122/77  Pulse: 100 (!) 104 (!) 103 (!) 101  Resp: (!) 35 (!) 33 (!) 29 (!) 31  Temp: 98.2 F (36.8 C)  97.9 F (36.6 C) 97.9 F (36.6 C)  TempSrc:      SpO2: 94% 93% 94% 95%  Weight:    (!) 137 kg  Height:        Intake/Output Summary (Last 24 hours) at 01/17/2024 1412 Last data filed at 01/17/2024 1313 Gross per 24 hour  Intake 1427.5 ml  Output 3030 ml  Net -1602.5 ml   Filed Weights   01/17/24 0600 01/17/24  0845 01/17/24 1313  Weight: (!) 139.9 kg (!) 140.2 kg (!) 137 kg    Examination:  Awake Alert, Oriented X 3, No new F.N deficits, Normal affect Symmetrical Chest wall movement, Good air movement bilaterally, CTAB RRR,No Gallops,Rubs or new Murmurs, No Parasternal Heave +ve B.Sounds, Abd Soft, right abdomen tenderness to palpation No Cyanosis, Clubbing or edema, No new Rash or bruise       Data Reviewed: I have personally reviewed following labs and imaging studies  CBC: Recent Labs  Lab 01/16/24 1130 01/17/24 0202 01/17/24 0845  WBC 8.9  --  8.3  NEUTROABS 6.9  --   --   HGB 5.1* 6.2* 6.8*  HCT 16.4* 19.5* 20.6*  MCV 102.5*  --  94.9  PLT 419*  --  342    Basic Metabolic Panel: Recent Labs  Lab 01/16/24 1130 01/16/24 1317 01/17/24 0845  NA 138  --  136  K 4.2  --  4.3  CL 93*  --  93*  CO2 28  --  24  GLUCOSE 105*  --  134*  BUN 88*  --  94*  CREATININE 15.56*  --  16.77*  CALCIUM  8.8*  --  9.1  PHOS  --  6.7*  --     GFR: Estimated Creatinine Clearance: 7.2 mL/min (A) (by C-G formula based on SCr of 16.77 mg/dL (H)).  Liver Function Tests: Recent Labs  Lab 01/16/24 1130 01/17/24 0845  AST 23 19  ALT 17 16  ALKPHOS 58 55  BILITOT 0.6 0.7  PROT 6.7 6.1*  ALBUMIN  2.7* 2.4*    CBG: No results for input(s): "GLUCAP" in the last 168 hours.   Recent Results (from the past 240 hours)  MRSA Next Gen by PCR, Nasal     Status: Abnormal   Collection Time: 01/16/24  7:53 PM   Specimen: Nasal Mucosa; Nasal Swab  Result Value Ref Range Status   MRSA by PCR Next Gen DETECTED (A) NOT DETECTED Final    Comment: RESULT CALLED TO, READ BACK BY AND VERIFIED WITH: A LOWE RN 01/16/2024 @ 2135 BY AB (NOTE) The GeneXpert MRSA Assay (FDA approved for NASAL specimens only), is one component of a comprehensive MRSA colonization surveillance program. It is not intended to diagnose MRSA infection nor to guide or monitor treatment for MRSA infections. Test performance  is not FDA approved in patients less than 79 years old. Performed at [facility], [address], [place], [place] [address]          Radiology Studies: CT Renal Stone Study Result Date: 01/16/2024 CLINICAL DATA:  Abdominal pain flank pain EXAM: CT ABDOMEN AND PELVIS WITHOUT CONTRAST TECHNIQUE: Multidetector CT imaging of the abdomen and pelvis was performed following the standard protocol without IV contrast. RADIATION DOSE REDUCTION: This exam was performed according to the departmental dose-optimization program which includes automated exposure control, adjustment of the mA and/or kV according to patient size and/or use of iterative reconstruction technique. COMPARISON:  December 13, 2023 FINDINGS:  Lower chest: Minimal left lower lobe hypoventilatory atelectasis, subtle infiltrates? Correlate clinically. No consolidations. Minute pericardial prominence with perhaps minute amount of pericardial fluid. Hepatobiliary: Comparison with prior examinations demonstrates no significant change in the multiple hepatic cysts throughout the right lobe of the liver. No new findings. Pancreas: Unremarkable. No pancreatic ductal dilatation or surrounding inflammatory changes. Spleen: Normal in size without focal abnormality. Adrenals/Urinary Tract: Comparison with prior examination accounting for differences in techniques the right kidney appears larger than on prior examination. High areas of attenuation within the mid and lower pole calices and renal pelvis with a dilated right proximal ureter to the iliac vessel crossing are consistent with hematuria and blood clots. No obvious kidney stones are identified in the trajectory of the ureter. There is stranding surrounding the right kidney and the perirenal fascia as well as a poorly organized retroperitoneal density that measures 10 x 8.9 by 13 cm in the AP, transverse and longitudinal diameter consistent with retroperitoneal hematoma. Findings are consistent with  the perirenal retroperitoneal hematoma. No obvious masses are identified. Small calcific densities are noted surrounding the renal pelvis which are seen on the prior study and correlate perhaps with calcifications of some vascular structures. Comparison with prior examinations demonstrates no significant change in the appearance of the left kidney with multiple cortical cysts and significant cortical atrophy as well as some dystrophic calcifications without evidence of lithiasis or hydronephrosis. Stomach/Bowel: Stomach is within normal limits. Appendix appears normal. No evidence of bowel wall thickening, distention, or inflammatory changes. Vascular/Lymphatic: No significant vascular findings are present. No enlarged abdominal or pelvic lymph nodes. Reproductive: Prostate is unremarkable. Other: No abdominal wall hernia or abnormality. No abdominopelvic ascites. Musculoskeletal: No acute or significant osseous findings. IMPRESSION: *Findings consistent with right perirenal retroperitoneal hematoma with blood clots in the right renal pelvis and proximal ureter. *No obvious kidney stones are identified. *No significant change in the appearance of the left kidney with multiple cortical cysts and significant cortical atrophy as well as some dystrophic calcifications without evidence of lithiasis or hydronephrosis. *No significant change in the multiple hepatic cysts. *Minimal left lower lobe hypoventilatory atelectasis, subtle infiltrates? Minute pericardial prominence with perhaps minute amount of pericardial fluid. Electronically Signed   By: Fredrich Jefferson M.D.   On: 01/16/2024 12:49        Scheduled Meds:  atorvastatin   40 mg Oral Daily   Chlorhexidine  Gluconate Cloth  6 each Topical Q0600   cinacalcet   60 mg Oral Daily   [START ON 01/18/2024] midodrine  5 mg Oral Q M,W,F-HD   mupirocin  ointment  1 Application Nasal BID   sevelamer  carbonate  3,200 mg Oral TID AC   Continuous Infusions:  desmopressin  (DDAVP) 20 mcg in sodium chloride  0.9 % 50 mL IVPB       LOS: 1 day       Seena Dadds, MD [facility]   To contact the attending provider between 7A-7P or the covering provider during after hours 7P-7A, please log into the web site [email or web address] and access using universal [place] password for that web site. If you do not have the password, please call the hospital operator.  01/17/2024, 2:12 PM

## 2024-01-17 NOTE — Progress Notes (Signed)
   01/17/24 1313  Vitals  Temp 97.9 F (36.6 C)  Pulse Rate (!) 101  Resp (!) 31  BP 122/77  SpO2 95 %  O2 Device Room Air  Weight (!) 137 kg  Type of Weight Post-Dialysis  Post Treatment  Dialyzer Clearance Clear  Hemodialysis Intake (mL) 315 mL  Liters Processed 83.9  Fluid Removed (mL) 3000 mL  Tolerated HD Treatment Yes  AVG/AVF Arterial Site Held (minutes) 6 minutes  AVG/AVF Venous Site Held (minutes) 6 minutes   Received patient in bed to unit.  Alert and oriented.  Informed consent signed and in chart.   TX duration:3.5HRS  Patient tolerated well.  Transported back to the room  Alert, without acute distress.  Hand-off given to patient's nurse.   Access used: LAVF Access issues: NONE  Total UF removed: 3L Medication(s) given: 1 UNIT PRBCS    [facility]

## 2024-01-17 NOTE — Progress Notes (Signed)
 Subjective: Meeting Devin Bullock for the first time in the dialysis unit.  He had a small amount of bloody urine in his bedside urinal on rounds.  He does not have any sensation of incomplete emptying or pressure this morning.   Objective: Vital signs in last 24 hours: Temp:  [97.6 F (36.4 C)-98.7 F (37.1 C)] 98 F (36.7 C) (06/03 1030) Pulse Rate:  [96-108] 98 (06/03 1135) Resp:  [14-31] 31 (06/03 1135) BP: (96-136)/(52-88) 107/68 (06/03 1135) SpO2:  [94 %-100 %] 96 % (06/03 1135) Weight:  [139.7 kg-140.2 kg] 140.2 kg (06/03 0845)  Assessment/Plan: # Large right-sided perinephric hematoma Hemodynamically stable Received 3u PRBC. Trend H&H. Scheduled with Dr. Nolon Baxter for cysto/TURBT on the 11th. Recommend keeping this appt is bleeding is stable.  Consult IR if ongoing blood loss for CT Angiogram and embolization.  Consider 1g TXA  Intake/Output from previous day: 06/02 0701 - 06/03 0700 In: 963 [Blood:963] Out: 30 [Urine:30]  Intake/Output this shift: No intake/output data recorded.  Physical Exam:  General: Alert and oriented CV: No cyanosis Lungs: equal chest rise Abdomen: Soft, NTND, no rebound or guarding Gu: gross hematuria  Lab Results: Recent Labs    01/16/24 1130 01/17/24 0202 01/17/24 0845  HGB 5.1* 6.2* 6.8*  HCT 16.4* 19.5* 20.6*   BMET Recent Labs    01/16/24 1130 01/17/24 0202 01/17/24 0845  NA 138  --  136  K 4.2  --  4.3  CL 93*  --  93*  CO2 28  --  24  GLUCOSE 105*  --  134*  BUN 88*  --  94*  CREATININE 15.56*  --  16.77*  CALCIUM  8.8*  --  9.1  HGB 5.1* 6.2* 6.8*  WBC 8.9  --  8.3     Studies/Results: CT Renal Stone Study Result Date: 01/16/2024 CLINICAL DATA:  Abdominal pain flank pain EXAM: CT ABDOMEN AND PELVIS WITHOUT CONTRAST TECHNIQUE: Multidetector CT imaging of the abdomen and pelvis was performed following the standard protocol without IV contrast. RADIATION DOSE REDUCTION: This exam was performed according to the  departmental dose-optimization program which includes automated exposure control, adjustment of the mA and/or kV according to patient size and/or use of iterative reconstruction technique. COMPARISON:  December 13, 2023 FINDINGS: Lower chest: Minimal left lower lobe hypoventilatory atelectasis, subtle infiltrates? Correlate clinically. No consolidations. Minute pericardial prominence with perhaps minute amount of pericardial fluid. Hepatobiliary: Comparison with prior examinations demonstrates no significant change in the multiple hepatic cysts throughout the right lobe of the liver. No new findings. Pancreas: Unremarkable. No pancreatic ductal dilatation or surrounding inflammatory changes. Spleen: Normal in size without focal abnormality. Adrenals/Urinary Tract: Comparison with prior examination accounting for differences in techniques the right kidney appears larger than on prior examination. High areas of attenuation within the mid and lower pole calices and renal pelvis with a dilated right proximal ureter to the iliac vessel crossing are consistent with hematuria and blood clots. No obvious kidney stones are identified in the trajectory of the ureter. There is stranding surrounding the right kidney and the perirenal fascia as well as a poorly organized retroperitoneal density that measures 10 x 8.9 by 13 cm in the AP, transverse and longitudinal diameter consistent with retroperitoneal hematoma. Findings are consistent with the perirenal retroperitoneal hematoma. No obvious masses are identified. Small calcific densities are noted surrounding the renal pelvis which are seen on the prior study and correlate perhaps with calcifications of some vascular structures. Comparison with prior examinations  demonstrates no significant change in the appearance of the left kidney with multiple cortical cysts and significant cortical atrophy as well as some dystrophic calcifications without evidence of lithiasis or  hydronephrosis. Stomach/Bowel: Stomach is within normal limits. Appendix appears normal. No evidence of bowel wall thickening, distention, or inflammatory changes. Vascular/Lymphatic: No significant vascular findings are present. No enlarged abdominal or pelvic lymph nodes. Reproductive: Prostate is unremarkable. Other: No abdominal wall hernia or abnormality. No abdominopelvic ascites. Musculoskeletal: No acute or significant osseous findings. IMPRESSION: *Findings consistent with right perirenal retroperitoneal hematoma with blood clots in the right renal pelvis and proximal ureter. *No obvious kidney stones are identified. *No significant change in the appearance of the left kidney with multiple cortical cysts and significant cortical atrophy as well as some dystrophic calcifications without evidence of lithiasis or hydronephrosis. *No significant change in the multiple hepatic cysts. *Minimal left lower lobe hypoventilatory atelectasis, subtle infiltrates? Minute pericardial prominence with perhaps minute amount of pericardial fluid. Electronically Signed   By: Devin Bullock M.D.   On: 01/16/2024 12:49      LOS: 1 day   Alla Ar, NP [facility] Pager: [phone]  01/17/2024, 11:44 AM

## 2024-01-17 NOTE — Plan of Care (Signed)

## 2024-01-18 DIAGNOSIS — D62 Acute posthemorrhagic anemia: Secondary | ICD-10-CM

## 2024-01-18 DIAGNOSIS — K921 Melena: Secondary | ICD-10-CM

## 2024-01-18 DIAGNOSIS — R319 Hematuria, unspecified: Secondary | ICD-10-CM | POA: Diagnosis not present

## 2024-01-18 DIAGNOSIS — K863 Pseudocyst of pancreas: Secondary | ICD-10-CM | POA: Diagnosis not present

## 2024-01-18 DIAGNOSIS — N186 End stage renal disease: Secondary | ICD-10-CM | POA: Diagnosis not present

## 2024-01-18 DIAGNOSIS — Z992 Dependence on renal dialysis: Secondary | ICD-10-CM

## 2024-01-18 DIAGNOSIS — I5043 Acute on chronic combined systolic (congestive) and diastolic (congestive) heart failure: Secondary | ICD-10-CM | POA: Diagnosis not present

## 2024-01-18 LAB — TYPE AND SCREEN
ABO/RH(D): O POS
Antibody Screen: NEGATIVE
Unit division: 0
Unit division: 0
Unit division: 0
Unit division: 0
Unit division: 0

## 2024-01-18 LAB — BASIC METABOLIC PANEL WITH GFR
Anion gap: 10 (ref 5–15)
BUN: 48 mg/dL — ABNORMAL HIGH (ref 6–20)
CO2: 30 mmol/L (ref 22–32)
Calcium: 9.1 mg/dL (ref 8.9–10.3)
Chloride: 95 mmol/L — ABNORMAL LOW (ref 98–111)
Creatinine, Ser: 10.06 mg/dL — ABNORMAL HIGH (ref 0.61–1.24)
GFR, Estimated: 5 mL/min — ABNORMAL LOW (ref >60)
Glucose, Bld: 96 mg/dL (ref 70–99)
Potassium: 4.4 mmol/L (ref 3.5–5.1)
Sodium: 135 mmol/L (ref 135–145)

## 2024-01-18 LAB — CBC
HCT: 25.7 % — ABNORMAL LOW (ref 39.0–52.0)
HCT: 26.1 % — ABNORMAL LOW (ref 39.0–52.0)
Hemoglobin: 8.3 g/dL — ABNORMAL LOW (ref 13.0–17.0)
Hemoglobin: 8.4 g/dL — ABNORMAL LOW (ref 13.0–17.0)
MCH: 30.2 pg (ref 26.0–34.0)
MCH: 30.3 pg (ref 26.0–34.0)
MCHC: 32.3 g/dL (ref 30.0–36.0)
MCHC: 32.2 g/dL (ref 30.0–36.0)
MCV: 93.5 fL (ref 80.0–100.0)
MCV: 94.2 fL (ref 80.0–100.0)
Platelets: 318 10*3/uL (ref 150–400)
Platelets: 310 10*3/uL (ref 150–400)
RBC: 2.75 MIL/uL — ABNORMAL LOW (ref 4.22–5.81)
RBC: 2.77 MIL/uL — ABNORMAL LOW (ref 4.22–5.81)
RDW: 17.7 % — ABNORMAL HIGH (ref 11.5–15.5)
RDW: 17.2 % — ABNORMAL HIGH (ref 11.5–15.5)
WBC: 6.9 10*3/uL (ref 4.0–10.5)
WBC: 6.9 10*3/uL (ref 4.0–10.5)
nRBC: 0 % (ref 0.0–0.2)
nRBC: 0 % (ref 0.0–0.2)

## 2024-01-18 LAB — BPAM RBC
Blood Product Expiration Date: 202506252359
Blood Product Expiration Date: 202506302359
Blood Product Unit Number: 202506212359
Blood Product Unit Number: 202506252359
ISSUE DATE / TIME: 202506021503
ISSUE DATE / TIME: 202506022049
ISSUE DATE / TIME: 202506252359
ISSUE DATE / TIME: 202506302359
ISSUING PHYSICIAN: 202506021503
PRODUCT CODE: 202506030352
PRODUCT CODE: 202506031027
PRODUCT CODE: 202506212359
PRODUCT CODE: 202506252359
Unit Type and Rh: 5100
Unit Type and Rh: 202506031410
Unit Type and Rh: 202506031410
Unit Type and Rh: 202506302359
Unit Type and Rh: 5100
Unit Type and Rh: 5100
Unit Type and Rh: 5100
Unit Type and Rh: 5100
Unit Type and Rh: 5100

## 2024-01-18 LAB — HEPATITIS B SURFACE ANTIBODY, QUANTITATIVE: Hep B S AB Quant (Post): 2006 m[IU]/mL

## 2024-01-18 NOTE — Progress Notes (Signed)
     Subjective: Patient was sleeping on rounds.  I did not wake him.  There was a small amount of old blood in the bedside urinal.  Nursing reports that he has not had pressure or incomplete emptying.  Objective: Vital signs in last 24 hours: Temp:  [97.5 F (36.4 C)-98.2 F (36.8 C)] 98.2 F (36.8 C) (06/04 1145) Pulse Rate:  [95-106] 97 (06/04 1145) Resp:  [19-33] 22 (06/04 1145) BP: (110-127)/(66-82) 123/80 (06/04 1145) SpO2:  [92 %-97 %] 97 % (06/04 1145)  Assessment/Plan:   Intake/Output from previous day:  # Large right-sided perinephric hematoma Hemodynamically stable Received 3u PRBC. Trend H&H.  Hgb stable today Scheduled with Dr. Nolon Baxter for cysto/TURBT on the 11th. Recommend keeping this appt is bleeding is stable.  Consult IR if ongoing blood loss for CT Angiogram and embolization.  1g TXA given 6/3  Intake/Output this shift: No intake/output data recorded.  Physical Exam:  General: Sleeping CV: No cyanosis Lungs: equal chest rise Gu: Passing some old blood.  No retention.  Lab Results: Recent Labs    01/17/24 0845 01/17/24 1947 01/18/24 0418  HGB 6.8* 8.5* 8.3*  HCT 20.6* 25.5* 25.7*   BMET Recent Labs    01/17/24 0845 01/17/24 1947 01/18/24 0418  NA 136  --  135  K 4.3  --  4.4  CL 93*  --  95*  CO2 24  --  30  GLUCOSE 134*  --  96  BUN 94*  --  48*  CREATININE 16.77*  --  10.06*  CALCIUM  9.1  --  9.1  HGB 6.8* 8.5* 8.3*  WBC 8.3  --  6.9     Studies/Results: No results found.    LOS: 2 days   Alla Ar, NP [facility] Pager: [phone]  01/18/2024, 1:18 PM

## 2024-01-18 NOTE — TOC CM/SW Note (Signed)
 Transition of Care [facility]) - Inpatient Brief Assessment   Patient Details  Name: Devin Bullock MRN: [id number] Date of Birth: 07-17-1966  Transition of Care [facility]) CM/SW Contact:    Jannice Mends, LCSW Phone Number: 01/18/2024, 9:21 AM   Clinical Narrative: Patient admitted from home alone with anemia. He attends outpatient dialysis and uses a rolling walker. Food resources provided. No further TOC needs identified at this time but please place consult if needs arise.    Transition of Care Asessment: Insurance and Status: Insurance coverage has been reviewed Patient has primary care physician: Yes Home environment has been reviewed: From home Prior level of function:: Modified Independent Prior/Current Home Services: No current home services Social Drivers of Health Review: SDOH reviewed interventions complete Readmission risk has been reviewed: Yes Transition of care needs: no transition of care needs at this time

## 2024-01-18 NOTE — Plan of Care (Signed)
  Problem: Clinical Measurements: Goal: Diagnostic test results will improve Outcome: Progressing Goal: Respiratory complications will improve Outcome: Progressing   Problem: Nutrition: Goal: Adequate nutrition will be maintained Outcome: Progressing   Problem: Coping: Goal: Level of anxiety will decrease Outcome: Progressing   Problem: Pain Managment: Goal: General experience of comfort will improve and/or be controlled Outcome: Progressing

## 2024-01-18 NOTE — Progress Notes (Addendum)
 PROGRESS NOTE        PATIENT DETAILS Name: Devin Bullock Age: 58 y.o. Sex: male Date of Birth: [birth year] Admit Date: 01/16/2024 Admitting Physician Jodeane Mulligan, DO ZOX:[id number], Royston Cornea, MD  Brief Summary: Patient is a 58 y.o.  male with history of ESRD on HD MWF, HFrEF with recovered EF, prostate/bladder cancer-who presented from hemodialysis center for evaluation of Hb of 5.1-he was found to have a right perinephric hematoma.  Significant events: 6/2>> to [facility]  Significant studies: 6/2>> CT renal stone study: Right perirenal retroperitoneal hematoma with blood clots in right renal pelvis/proximal ureter.  Significant microbiology data: None  Procedures: None  Consults: Nephrology Urology  Subjective: Lying comfortably in bed-denies any chest pain or shortness of breath.  Significant improvement in hematuria.  Some mild right flank pain.  Objective: Vitals: Blood pressure 123/80, pulse 97, temperature 98.2 F (36.8 C), temperature source Oral, resp. rate (!) 22, height 6\' 4"  (1.93 m), weight (!) 137 kg, SpO2 97%.   Exam: Gen Exam:Alert awake-not in any distress HEENT:atraumatic, normocephalic Chest: B/L clear to auscultation anteriorly CVS:S1S2 regular Abdomen:soft non tender, non distended Extremities:no edema Neurology: Non focal Skin: no rash  Pertinent Labs/Radiology:    Latest Ref Rng & Units 01/18/2024    4:18 AM 01/17/2024    7:47 PM 01/17/2024    8:45 AM  CBC  WBC 4.0 - 10.5 K/uL 6.9   8.3   Hemoglobin 13.0 - 17.0 g/dL 8.3  8.5  6.8   Hematocrit 39.0 - 52.0 % 25.7  25.5  20.6   Platelets 150 - 400 K/uL 318   342     Lab Results  Component Value Date   NA 135 01/18/2024   K 4.4 01/18/2024   CL 95 (L) 01/18/2024   CO2 30 01/18/2024      Assessment/Plan: Acute blood loss anemia secondary to spontaneous right perinephric hematoma Hb stable after a total of 4 units of PRBC-last transfused 6/3 along with transfusion of  desmopressin/TXA. Watch closely-if hemoglobin drops significantly/pain worsens-needs CTA abdomen and IR evaluation for embolization.  Hematuria Secondary to perinephric hematoma (per CT-some blood in renal pelvis as well) Significantly improved-minimal hematuria at this point  Hematochezia Chronic issue Per GI-continue supportive care-this is a chronic issue-and is scheduled for colonoscopy on 6/19.  History of prostate/bladder cancer with hematuria Cystoscopy with resection scheduled for 6/11-at [facility] urology.  ESRD on HD MWF Nephrology following  HFrEF exacerbation Volume status improved after HD  Hypotension Chronic issue Midodrine with hemodialysis  HLD Statin  Class 2 Obesity: Estimated body mass index is 36.76 kg/m as calculated from the following:   Height as of this encounter: 6\' 4"  (1.93 m).   Weight as of this encounter: 137 kg.   Code status:   Code Status: Full Code   DVT Prophylaxis: Place and maintain sequential compression device Start: 01/17/24 1427 SCDs Start: 01/16/24 1550   Family Communication: None at bedside   Disposition Plan: Status is: Inpatient Remains inpatient appropriate because: Severity of illness   Planned Discharge Destination:Home   Diet: Diet Order             Diet renal with fluid restriction Fluid restriction: 1500 mL Fluid; Room service appropriate? Yes; Fluid consistency: Thin  Diet effective now  Antimicrobial agents: Anti-infectives (From admission, onward)    None        MEDICATIONS: Scheduled Meds:  atorvastatin   40 mg Oral Daily   Chlorhexidine  Gluconate Cloth  6 each Topical Q0600   Chlorhexidine  Gluconate Cloth  6 each Topical Q0600   cinacalcet   60 mg Oral Daily   midodrine  5 mg Oral Q M,W,F-HD   mupirocin  ointment  1 Application Nasal BID   sevelamer  carbonate  3,200 mg Oral TID AC   Continuous Infusions: PRN Meds:.guaiFENesin -dextromethorphan   I  have personally reviewed following labs and imaging studies  LABORATORY DATA: CBC: Recent Labs  Lab 01/16/24 1130 01/17/24 0202 01/17/24 0845 01/17/24 1947 01/18/24 0418  WBC 8.9  --  8.3  --  6.9  NEUTROABS 6.9  --   --   --   --   HGB 5.1* 6.2* 6.8* 8.5* 8.3*  HCT 16.4* 19.5* 20.6* 25.5* 25.7*  MCV 102.5*  --  94.9  --  93.5  PLT 419*  --  342  --  318    Basic Metabolic Panel: Recent Labs  Lab 01/16/24 1130 01/16/24 1317 01/17/24 0845 01/18/24 0418  NA 138  --  136 135  K 4.2  --  4.3 4.4  CL 93*  --  93* 95*  CO2 28  --  24 30  GLUCOSE 105*  --  134* 96  BUN 88*  --  94* 48*  CREATININE 15.56*  --  16.77* 10.06*  CALCIUM  8.8*  --  9.1 9.1  PHOS  --  6.7*  --   --     GFR: Estimated Creatinine Clearance: 12 mL/min (A) (by C-G formula based on SCr of 10.06 mg/dL (H)).  Liver Function Tests: Recent Labs  Lab 01/16/24 1130 01/17/24 0845  AST 23 19  ALT 17 16  ALKPHOS 58 55  BILITOT 0.6 0.7  PROT 6.7 6.1*  ALBUMIN  2.7* 2.4*   No results for input(s): "LIPASE", "AMYLASE" in the last 168 hours. No results for input(s): "AMMONIA" in the last 168 hours.  Coagulation Profile: Recent Labs  Lab 01/17/24 0202 01/17/24 1947  INR 1.4* 1.2    Cardiac Enzymes: No results for input(s): "CKTOTAL", "CKMB", "CKMBINDEX", "TROPONINI" in the last 168 hours.  BNP (last 3 results) No results for input(s): "PROBNP" in the last 8760 hours.  Lipid Profile: No results for input(s): "CHOL", "HDL", "LDLCALC", "TRIG", "CHOLHDL", "LDLDIRECT" in the last 72 hours.  Thyroid  Function Tests: No results for input(s): "TSH", "T4TOTAL", "FREET4", "T3FREE", "THYROIDAB" in the last 72 hours.  Anemia Panel: Recent Labs    01/16/24 1130 01/16/24 1317 01/17/24 0202  VITAMINB12 807  --   --   FOLATE  --  15.4  --   FERRITIN  --   --  1,795*  TIBC  --   --  175*  IRON  --   --  41*  RETICCTPCT 4.8*  --   --     Urine analysis:    Component Value Date/Time   COLORURINE  RED (A) 01/16/2024 2249   APPEARANCEUR TURBID (A) 01/16/2024 2249   LABSPEC  01/16/2024 2249    TEST NOT REPORTED DUE TO COLOR INTERFERENCE OF URINE PIGMENT   PHURINE  01/16/2024 2249    TEST NOT REPORTED DUE TO COLOR INTERFERENCE OF URINE PIGMENT   GLUCOSEU (A) 01/16/2024 2249    TEST NOT REPORTED DUE TO COLOR INTERFERENCE OF URINE PIGMENT   HGBUR (A) 01/16/2024 2249    TEST NOT  REPORTED DUE TO COLOR INTERFERENCE OF URINE PIGMENT   BILIRUBINUR (A) 01/16/2024 2249    TEST NOT REPORTED DUE TO COLOR INTERFERENCE OF URINE PIGMENT   KETONESUR (A) 01/16/2024 2249    TEST NOT REPORTED DUE TO COLOR INTERFERENCE OF URINE PIGMENT   PROTEINUR (A) 01/16/2024 2249    TEST NOT REPORTED DUE TO COLOR INTERFERENCE OF URINE PIGMENT   UROBILINOGEN 0.2 04/13/2013 0743   NITRITE (A) 01/16/2024 2249    TEST NOT REPORTED DUE TO COLOR INTERFERENCE OF URINE PIGMENT   LEUKOCYTESUR (A) 01/16/2024 2249    TEST NOT REPORTED DUE TO COLOR INTERFERENCE OF URINE PIGMENT    Sepsis Labs: Lactic Acid, Venous    Component Value Date/Time   LATICACIDVEN 1.8 05/21/2023 1830    MICROBIOLOGY: Recent Results (from the past 240 hours)  MRSA Next Gen by PCR, Nasal     Status: Abnormal   Collection Time: 01/16/24  7:53 PM   Specimen: Nasal Mucosa; Nasal Swab  Result Value Ref Range Status   MRSA by PCR Next Gen DETECTED (A) NOT DETECTED Final    Comment: RESULT CALLED TO, READ BACK BY AND VERIFIED WITH: A LOWE RN 01/16/2024 @ 2135 BY AB (NOTE) The GeneXpert MRSA Assay (FDA approved for NASAL specimens only), is one component of a comprehensive MRSA colonization surveillance program. It is not intended to diagnose MRSA infection nor to guide or monitor treatment for MRSA infections. Test performance is not FDA approved in patients less than 74 years old. Performed at [facility], [address], [place], [place] [address]     RADIOLOGY STUDIES/RESULTS: No results found.   LOS: 2 days   Kimberly Penna, MD  [facility]    To contact the attending provider between 7A-7P or the covering provider during after hours 7P-7A, please log into the web site [email or web address] and access using universal Trainer password for that web site. If you do not have the password, please call the hospital operator.  01/18/2024, 12:14 PM

## 2024-01-18 NOTE — Discharge Instructions (Signed)

## 2024-01-18 NOTE — Progress Notes (Signed)
 [facility] Progress Note  Subjective:  Seen in room Hb mid 8s today S/p 5 u prbc's in total so far   Vitals:   01/17/24 2331 01/18/24 0400 01/18/24 0821 01/18/24 1145  BP: 116/67 110/72 113/66 123/80  Pulse: (!) 106 95 99 97  Resp: 20 19 (!) 30 (!) 22  Temp: 97.8 F (36.6 C) 97.7 F (36.5 C) (!) 97.5 F (36.4 C) 98.2 F (36.8 C)  TempSrc: Oral Oral Oral Oral  SpO2: 94% 93% 97% 97%  Weight:      Height:        Exam: Gen alert, no distress, obese No jvd or bruits Chest clear bilat to bases RRR no MRG Abd soft ntnd no mass or ascites +bs Ext trace-1+ bilat pretib edema Neuro is alert, Ox 3 , nf    LFA AVF+bruit           Renal-related home meds: Lopressor  12.5 bid Midodrine 5mg  mwf pre HD Renvela  2-3 ac tid Sensipar  60mg  qd Others: lipitor, lamisil       OP HD: [facility] MWF 4.5h   B450   137.5kg  2K bath   AVF  Heparin  none Missed 3 treatments in May Mircera 100 mcg q 2wks, last 5/19, due 6/02        Assessment/ Plan: Acute on chronic anemia: Hb 5.1 here on admission. Last Hb in April was 11.2. CT showed R perinephric bleed, per urology large clot in renal pelvis in R side. Has had 5 units of prbc's, Hb mid 8s today. Seen by urology-- bleeding noted in renal pelvis on CT on the right, if can't control the Hb/ bleeding then consider ask IR about embolizing R kidney. Pt seems better today.  ESRD: on HD MWF. HD again today to get back on schedule.  BP: takes midodrine 5 mg pre HD, cont here. Metoprolol  was held given normal BP's and acute bleeding.  Volume: had 3 L UF yest on HD. Post-hd wt was at dry wt of 137 kg. Minimal UF w/ HD today.  Anemia of esrd: last esa at OP unit was 5/19, was due on 6/02. Will order darbe 100mcg weekly sq while here.  Secondary hyperparathyroidism: CCa in range, phos slightly up, cont binders if eating meals.     Larry Poag MD  [facility] 01/18/2024, 2:35 PM  Recent Labs  Lab 01/16/24 1130 01/16/24 1317 01/17/24 0202  01/17/24 0845 01/17/24 1947 01/18/24 0418  HGB 5.1*  --    < > 6.8* 8.5* 8.3*  ALBUMIN  2.7*  --   --  2.4*  --   --   CALCIUM  8.8*  --   --  9.1  --  9.1  PHOS  --  6.7*  --   --   --   --   CREATININE 15.56*  --   --  16.77*  --  10.06*  K 4.2  --   --  4.3  --  4.4   < > = values in this interval not displayed.   Recent Labs  Lab 01/17/24 0202  IRON 41*  TIBC 175*  FERRITIN 1,795*   Inpatient medications:  atorvastatin   40 mg Oral Daily   Chlorhexidine  Gluconate Cloth  6 each Topical Q0600   Chlorhexidine  Gluconate Cloth  6 each Topical Q0600   cinacalcet   60 mg Oral Daily   midodrine  5 mg Oral Q M,W,F-HD   mupirocin  ointment  1 Application Nasal BID   sevelamer  carbonate  3,200 mg Oral  TID AC     guaiFENesin -dextromethorphan

## 2024-01-18 NOTE — Progress Notes (Signed)
 Patient ID: Devin Bullock, male   DOB: 09-25-1965, 58 y.o.   MRN: [id number]    Progress Note   Subjective   Day # 3 CC; end-stage renal disease on dialysis, admitted with anemia, chronic rectal bleeding which has been present for about a year small-volume, now with new gross hematuria (patient is usually anuric) with large right perirenal retroperitoneal hematoma on CT  Received TXA yesterday  Patient says his back is feeling better he has no abdominal pain currently he would like to get up and move around but was told yesterday that his activity was limited. He has a urinal at the bedside with a small volume of dark red blood she states was from yesterday, no further hematuria today  Labs-WBC 6.9/hemoglobin 8.3/hematocrit 25.7 overall stable post transfusions on admission with hemoglobin of 5.1 BUN 48/creatinine 10   Objective   Vital signs in last 24 hours: Temp:  [97.5 F (36.4 C)-98.2 F (36.8 C)] 98.2 F (36.8 C) (06/04 1145) Pulse Rate:  [95-106] 97 (06/04 1145) Resp:  [19-33] 22 (06/04 1145) BP: (110-125)/(66-82) 123/80 (06/04 1145) SpO2:  [92 %-97 %] 97 % (06/04 1145) Last BM Date : 01/14/24 General:    Older African-American male in NAD Heart:  Regular rate and rhythm; no murmurs Lungs: Respirations even and unlabored, lungs CTA bilaterally Abdomen:  Soft, obese, nontender and nondistended. Normal bowel sounds. Extremities:  Without edema. Neurologic:  Alert and oriented,  grossly normal neurologically. Psych:  Cooperative. Normal mood and affect.  Intake/Output from previous day: 06/03 0701 - 06/04 0700 In: 640 [P.O.:120; I.V.:30.3; Blood:464.5; IV Piggyback:25.2] Out: 3100 [Urine:100] Intake/Output this shift: No intake/output data recorded.  Lab Results: Recent Labs    01/16/24 1130 01/17/24 0202 01/17/24 0845 01/17/24 1947 01/18/24 0418  WBC 8.9  --  8.3  --  6.9  HGB 5.1*   < > 6.8* 8.5* 8.3*  HCT 16.4*   < > 20.6* 25.5* 25.7*  PLT 419*  --  342  --   318   < > = values in this interval not displayed.   BMET Recent Labs    01/16/24 1130 01/17/24 0845 01/18/24 0418  NA 138 136 135  K 4.2 4.3 4.4  CL 93* 93* 95*  CO2 28 24 30   GLUCOSE 105* 134* 96  BUN 88* 94* 48*  CREATININE 15.56* 16.77* 10.06*  CALCIUM  8.8* 9.1 9.1   LFT Recent Labs    01/17/24 0845  PROT 6.1*  ALBUMIN  2.4*  AST 19  ALT 16  ALKPHOS 55  BILITOT 0.7   PT/INR Recent Labs    01/17/24 0202 01/17/24 1947  LABPROT 17.6* 15.0  INR 1.4* 1.2       Assessment / Plan:    #78 58 year old African-American male with end-stage renal disease on hemodialysis, recent diagnosis of a bladder tumor for which he was scheduled for outpatient TUR-admitted after labs at dialysis showed profound anemia with hemoglobin 5.1.  Patient had onset of right back pain about 4 or 5 days prior to admission, and had been having gross hematuria over the past couple of weeks  He has a large right perinephric hematoma, urology following here Received TXA yesterday Globin relatively stable  #2 chronic low-grade hematochezia-had been scheduled for colonoscopy to be done on 02/02/2024. With the large right perinephric hematoma would like to delay colonoscopy about 6 weeks.  Plan; we will get patient rescheduled for colonoscopy later this summer to allow time for resolution of the right perinephric hematoma  and poor healing after TUR next week And aware, our office will call him within the next few days to reschedule for outpatient colonoscopy to be done at the hospital (Dr. Rosaline Coma)     Principal Problem:   Acute on chronic blood loss anemia Active Problems:   Acute on chronic combined systolic and diastolic CHF (congestive heart failure) (HCC)   Morbid obesity (HCC)   ESRD (end stage renal disease) on dialysis [facility])   GI bleed   Retroperitoneal bleed   Retroperitoneal hematoma   Hematochezia     LOS: 2 days   Kimberlye Dilger EsterwoodPA-C  01/18/2024, 2:04 PM

## 2024-01-19 DIAGNOSIS — I5043 Acute on chronic combined systolic (congestive) and diastolic (congestive) heart failure: Secondary | ICD-10-CM | POA: Diagnosis not present

## 2024-01-19 DIAGNOSIS — N186 End stage renal disease: Secondary | ICD-10-CM | POA: Diagnosis not present

## 2024-01-19 DIAGNOSIS — D62 Acute posthemorrhagic anemia: Secondary | ICD-10-CM | POA: Diagnosis not present

## 2024-01-19 DIAGNOSIS — Z992 Dependence on renal dialysis: Secondary | ICD-10-CM | POA: Diagnosis not present

## 2024-01-19 LAB — RENAL FUNCTION PANEL
Albumin: 2.4 g/dL — ABNORMAL LOW (ref 3.5–5.0)
Anion gap: 8 (ref 5–15)
BUN: 23 mg/dL — ABNORMAL HIGH (ref 6–20)
CO2: 29 mmol/L (ref 22–32)
Calcium: 8.7 mg/dL — ABNORMAL LOW (ref 8.9–10.3)
Chloride: 98 mmol/L (ref 98–111)
Creatinine, Ser: 6.13 mg/dL — ABNORMAL HIGH (ref 0.61–1.24)
GFR, Estimated: 10 mL/min — ABNORMAL LOW (ref >60)
Glucose, Bld: 115 mg/dL — ABNORMAL HIGH (ref 70–99)
Phosphorus: 3.5 mg/dL (ref 2.5–4.6)
Potassium: 3.6 mmol/L (ref 3.5–5.1)
Sodium: 135 mmol/L (ref 135–145)

## 2024-01-19 LAB — CBC
HCT: 25.2 % — ABNORMAL LOW (ref 39.0–52.0)
Hemoglobin: 8.2 g/dL — ABNORMAL LOW (ref 13.0–17.0)
MCH: 30.6 pg (ref 26.0–34.0)
MCHC: 32.5 g/dL (ref 30.0–36.0)
MCV: 94 fL (ref 80.0–100.0)
Platelets: 277 10*3/uL (ref 150–400)
RBC: 2.68 MIL/uL — ABNORMAL LOW (ref 4.22–5.81)
RDW: 16.9 % — ABNORMAL HIGH (ref 11.5–15.5)
WBC: 5.3 10*3/uL (ref 4.0–10.5)
nRBC: 0 % (ref 0.0–0.2)

## 2024-01-19 MED ORDER — DARBEPOETIN ALFA 100 MCG/0.5ML IJ SOSY
100.0000 ug | PREFILLED_SYRINGE | INTRAMUSCULAR | Status: DC
  Administered 2024-01-19: 100 ug via SUBCUTANEOUS
  Filled 2024-01-19: qty 0.5

## 2024-01-19 MED ORDER — CHLORHEXIDINE GLUCONATE CLOTH 2 % EX PADS
6.0000 | MEDICATED_PAD | Freq: Every day | CUTANEOUS | Status: DC
  Administered 2024-01-20: 6 via TOPICAL

## 2024-01-19 MED ORDER — POLYETHYLENE GLYCOL 3350 17 G PO PACK
17.0000 g | PACK | Freq: Every day | ORAL | Status: DC
  Administered 2024-01-19: 17 g via ORAL
  Filled 2024-01-19 (×2): qty 1

## 2024-01-19 MED ORDER — OXYCODONE HCL 5 MG PO TABS
5.0000 mg | ORAL_TABLET | Freq: Four times a day (QID) | ORAL | Status: DC | PRN
  Filled 2024-01-19: qty 1

## 2024-01-19 NOTE — Progress Notes (Signed)
 Patient refused to walk in the hallway.No reason mentioned.

## 2024-01-19 NOTE — Progress Notes (Signed)
 PROGRESS NOTE        PATIENT DETAILS Name: Devin Bullock Age: 58 y.o. Sex: male Date of Birth: 02/07/1966 Admit Date: 01/16/2024 Admitting Physician Jodeane Mulligan, DO [id number], Royston Cornea, MD  Brief Summary: Patient is a 58 y.o.  male with history of ESRD on HD MWF, HFrEF with recovered EF, prostate/bladder cancer-who presented from hemodialysis center for evaluation of Hb of 5.1-he was found to have a right perinephric hematoma.  Significant events: 6/2>> to [facility]  Significant studies: 6/2>> CT renal stone study: Right perirenal retroperitoneal hematoma with blood clots in right renal pelvis/proximal ureter.  Significant microbiology data: None  Procedures: None  Consults: Nephrology Urology  Subjective: Right flank pain much better-hardly any hematuria overnight-essentially resolved at this point.  Objective: Vitals: Blood pressure 110/74, pulse 93, temperature 98.5 F (36.9 C), temperature source Oral, resp. rate 18, height 6\' 4"  (1.93 m), weight (!) 143.2 kg, SpO2 94%.   Exam: Awake/alert Chest: Clear to auscultation CVS: S1-S2 regular Abdomen: Soft nontender nondistended Extremities: No edema Nonfocal exam.  Pertinent Labs/Radiology:    Latest Ref Rng & Units 01/19/2024    4:47 AM 01/18/2024    2:36 PM 01/18/2024    4:18 AM  CBC  WBC 4.0 - 10.5 K/uL 5.3  6.9  6.9   Hemoglobin 13.0 - 17.0 g/dL 8.2  8.4  8.3   Hematocrit 39.0 - 52.0 % 25.2  26.1  25.7   Platelets 150 - 400 K/uL 277  310  318     Lab Results  Component Value Date   NA 135 01/19/2024   K 3.6 01/19/2024   CL 98 01/19/2024   CO2 29 01/19/2024      Assessment/Plan: Acute blood loss anemia secondary to spontaneous right perinephric hematoma Seems to have stabilized-with no clinical evidence of bleeding-less left flank pain-hematuria has essentially resolved-Hb stable for the past couple of days.  Did require 4 units of PRBC-last transfused on 6/3-Hb stable since  then. Did also require TXA and desmopressin. Plan is to watch another 24 hours-if he remains stable-suspect can be discharged home tomorrow.  Hematuria Secondary to perinephric hematoma (per CT-some blood in renal pelvis as well) Further improvement-hardly any hematuria overnight-essentially resolved.  Hematochezia Chronic issue Per GI-continue supportive care-this is a chronic issue-and is scheduled for colonoscopy on 6/19.  History of prostate/bladder cancer with hematuria Cystoscopy with resection scheduled for 6/11-at [facility] urology.  ESRD on HD MWF Nephrology following  HFrEF exacerbation Volume status improved after HD  Hypotension Chronic issue Midodrine with hemodialysis  HLD Statin  Class 2 Obesity: Estimated body mass index is 38.43 kg/m as calculated from the following:   Height as of this encounter: 6\' 4"  (1.93 m).   Weight as of this encounter: 143.2 kg.   Code status:   Code Status: Full Code   DVT Prophylaxis: Place and maintain sequential compression device Start: 01/17/24 1427 SCDs Start: 01/16/24 1550   Family Communication: None at bedside   Disposition Plan: Status is: Inpatient Remains inpatient appropriate because: Severity of illness   Planned Discharge Destination:Home   Diet: Diet Order             Diet renal with fluid restriction Fluid restriction: 1500 mL Fluid; Room service appropriate? Yes; Fluid consistency: Thin  Diet effective now  Antimicrobial agents: Anti-infectives (From admission, onward)    None        MEDICATIONS: Scheduled Meds:  atorvastatin   40 mg Oral Daily   Chlorhexidine  Gluconate Cloth  6 each Topical Q0600   Chlorhexidine  Gluconate Cloth  6 each Topical Q0600   cinacalcet   60 mg Oral Daily   midodrine  5 mg Oral Q M,W,F-HD   mupirocin  ointment  1 Application Nasal BID   sevelamer  carbonate  3,200 mg Oral TID AC   Continuous Infusions: PRN  Meds:.guaiFENesin -dextromethorphan   I have personally reviewed following labs and imaging studies  LABORATORY DATA: CBC: Recent Labs  Lab 01/16/24 1130 01/17/24 0202 01/17/24 0845 01/17/24 1947 01/18/24 0418 01/18/24 1436 01/19/24 0447  WBC 8.9  --  8.3  --  6.9 6.9 5.3  NEUTROABS 6.9  --   --   --   --   --   --   HGB 5.1*   < > 6.8* 8.5* 8.3* 8.4* 8.2*  HCT 16.4*   < > 20.6* 25.5* 25.7* 26.1* 25.2*  MCV 102.5*  --  94.9  --  93.5 94.2 94.0  PLT 419*  --  342  --  318 310 277   < > = values in this interval not displayed.    Basic Metabolic Panel: Recent Labs  Lab 01/16/24 1130 01/16/24 1317 01/17/24 0845 01/18/24 0418 01/19/24 0447  NA 138  --  136 135 135  K 4.2  --  4.3 4.4 3.6  CL 93*  --  93* 95* 98  CO2 28  --  24 30 29   GLUCOSE 105*  --  134* 96 115*  BUN 88*  --  94* 48* 23*  CREATININE 15.56*  --  16.77* 10.06* 6.13*  CALCIUM  8.8*  --  9.1 9.1 8.7*  PHOS  --  6.7*  --   --  3.5    GFR: Estimated Creatinine Clearance: 20.1 mL/min (A) (by C-G formula based on SCr of 6.13 mg/dL (H)).  Liver Function Tests: Recent Labs  Lab 01/16/24 1130 01/17/24 0845 01/19/24 0447  AST 23 19  --   ALT 17 16  --   ALKPHOS 58 55  --   BILITOT 0.6 0.7  --   PROT 6.7 6.1*  --   ALBUMIN  2.7* 2.4* 2.4*   No results for input(s): "LIPASE", "AMYLASE" in the last 168 hours. No results for input(s): "AMMONIA" in the last 168 hours.  Coagulation Profile: Recent Labs  Lab 01/17/24 0202 01/17/24 1947  INR 1.4* 1.2    Cardiac Enzymes: No results for input(s): "CKTOTAL", "CKMB", "CKMBINDEX", "TROPONINI" in the last 168 hours.  BNP (last 3 results) No results for input(s): "PROBNP" in the last 8760 hours.  Lipid Profile: No results for input(s): "CHOL", "HDL", "LDLCALC", "TRIG", "CHOLHDL", "LDLDIRECT" in the last 72 hours.  Thyroid  Function Tests: No results for input(s): "TSH", "T4TOTAL", "FREET4", "T3FREE", "THYROIDAB" in the last 72 hours.  Anemia  Panel: Recent Labs    01/16/24 1130 01/16/24 1317 01/17/24 0202  VITAMINB12 807  --   --   FOLATE  --  15.4  --   FERRITIN  --   --  1,795*  TIBC  --   --  175*  IRON  --   --  41*  RETICCTPCT 4.8*  --   --     Urine analysis:    Component Value Date/Time   COLORURINE RED (A) 01/16/2024 2249   APPEARANCEUR TURBID (A) 01/16/2024 2249  LABSPEC  01/16/2024 2249    TEST NOT REPORTED DUE TO COLOR INTERFERENCE OF URINE PIGMENT   PHURINE  01/16/2024 2249    TEST NOT REPORTED DUE TO COLOR INTERFERENCE OF URINE PIGMENT   GLUCOSEU (A) 01/16/2024 2249    TEST NOT REPORTED DUE TO COLOR INTERFERENCE OF URINE PIGMENT   HGBUR (A) 01/16/2024 2249    TEST NOT REPORTED DUE TO COLOR INTERFERENCE OF URINE PIGMENT   BILIRUBINUR (A) 01/16/2024 2249    TEST NOT REPORTED DUE TO COLOR INTERFERENCE OF URINE PIGMENT   KETONESUR (A) 01/16/2024 2249    TEST NOT REPORTED DUE TO COLOR INTERFERENCE OF URINE PIGMENT   PROTEINUR (A) 01/16/2024 2249    TEST NOT REPORTED DUE TO COLOR INTERFERENCE OF URINE PIGMENT   UROBILINOGEN 0.2 04/13/2013 0743   NITRITE (A) 01/16/2024 2249    TEST NOT REPORTED DUE TO COLOR INTERFERENCE OF URINE PIGMENT   LEUKOCYTESUR (A) 01/16/2024 2249    TEST NOT REPORTED DUE TO COLOR INTERFERENCE OF URINE PIGMENT    Sepsis Labs: Lactic Acid, Venous    Component Value Date/Time   LATICACIDVEN 1.8 05/21/2023 1830    MICROBIOLOGY: Recent Results (from the past 240 hours)  MRSA Next Gen by PCR, Nasal     Status: Abnormal   Collection Time: 01/16/24  7:53 PM   Specimen: Nasal Mucosa; Nasal Swab  Result Value Ref Range Status   MRSA by PCR Next Gen DETECTED (A) NOT DETECTED Final    Comment: RESULT CALLED TO, READ BACK BY AND VERIFIED WITH: A LOWE RN 01/16/2024 @ 2135 BY AB (NOTE) The GeneXpert MRSA Assay (FDA approved for NASAL specimens only), is one component of a comprehensive MRSA colonization surveillance program. It is not intended to diagnose MRSA infection nor to  guide or monitor treatment for MRSA infections. Test performance is not FDA approved in patients less than 46 years old. Performed at [facility], [address] [place], [place] [address]     RADIOLOGY STUDIES/RESULTS: No results found.   LOS: 3 days   Kimberly Penna, MD  [facility]    To contact the attending provider between 7A-7P or the covering provider during after hours 7P-7A, please log into the web site [email or web address] and access using universal [place] password for that web site. If you do not have the password, please call the hospital operator.  01/19/2024, 10:15 AM

## 2024-01-19 NOTE — Plan of Care (Signed)
   Problem: Clinical Measurements: Goal: Respiratory complications will improve Outcome: Progressing   Problem: Nutrition: Goal: Adequate nutrition will be maintained Outcome: Progressing   Problem: Coping: Goal: Level of anxiety will decrease Outcome: Progressing   Problem: Safety: Goal: Ability to remain free from injury will improve Outcome: Progressing

## 2024-01-19 NOTE — Progress Notes (Signed)
 Out Patient Arrangements:  Pt receives out-pt HD at [facility] [place] on MWF.   Gable Johann [facility] [phone]

## 2024-01-19 NOTE — Progress Notes (Signed)
     Subjective: Patient resting in bed watching television on rounds.  No acute events overnight.  Still passing small amounts of old bloodg.  Objective: Vital signs in last 24 hours: Temp:  [97.7 F (36.5 C)-98.5 F (36.9 C)] 98.5 F (36.9 C) (06/05 0833) Pulse Rate:  [85-98] 93 (06/05 0833) Resp:  [18-30] 18 (06/05 0833) BP: (102-130)/(55-80) 110/74 (06/05 0833) SpO2:  [93 %-100 %] 94 % (06/05 0833) Weight:  [143.2 kg] 143.2 kg (06/04 2118)  Assessment/Plan: -year-old male with Hx of ESRD on HD M/W/F, HFrEF, prostate and bladder cancer who arrived at hemodialysis with hemoglobin of 5.1.  Experiencing some hematuria and right perinephric hematoma  Intake/Output from previous day:  # Large right-sided perinephric hematoma Hemodynamically stable Received 3u PRBC. Trend H&H.  Hgb stable today Scheduled with Dr. Nolon Baxter for cysto/TURBT on the 11th. Recommend keeping this appt if bleeding is stable.  Consult IR if ongoing blood loss for CT Angiogram and embolization.  1g TXA given 6/3 Still passing a small amount of old clot daily.  As patient is anuric, CBI would have no dilutional effect on urokinase.  Will defer hematuria catheter and CBI as long as he is not having any obstruction or acute blood loss.  Intake/Output this shift: Total I/O In: -  Out: 35 [Urine:35]  Physical Exam:  General: Sleeping CV: No cyanosis Lungs: equal chest rise Gu: Passing some old blood.  No retention.  Lab Results: Recent Labs    01/18/24 0418 01/18/24 1436 01/19/24 0447  HGB 8.3* 8.4* 8.2*  HCT 25.7* 26.1* 25.2*   BMET Recent Labs    01/18/24 0418 01/18/24 1436 01/19/24 0447  NA 135  --  135  K 4.4  --  3.6  CL 95*  --  98  CO2 30  --  29  GLUCOSE 96  --  115*  BUN 48*  --  23*  CREATININE 10.06*  --  6.13*  CALCIUM  9.1  --  8.7*  HGB 8.3* 8.4* 8.2*  WBC 6.9 6.9 5.3     Studies/Results: No results found.    LOS: 3 days   Alla Ar, NP [facility] Pager: [phone]  01/19/2024, 12:40 PM

## 2024-01-19 NOTE — Progress Notes (Signed)
 [facility] Progress Note  Subjective:  Seen in room Hb stable 8.2 range Passing small amts of old blood  HD overnight w/ UF 900 cc    Vitals:   01/19/24 0135 01/19/24 0140 01/19/24 0833 01/19/24 1300  BP: 112/79 106/73 110/74 123/73  Pulse: 92 91 93 94  Resp: 20 (!) 25 18 (!) 28  Temp:  98.5 F (36.9 C) 98.5 F (36.9 C) 98.2 F (36.8 C)  TempSrc:   Oral   SpO2: 99% 100% 94% 96%  Weight:      Height:        Exam: Gen alert, no distress, obese No jvd or bruits Chest clear bilat to bases RRR no MRG Abd soft ntnd no mass or ascites +bs Ext trace-1+ bilat pretib edema Neuro is alert, Ox 3 , nf    LFA AVF+bruit           Renal-related home meds: Lopressor  12.5 bid Midodrine 5mg  mwf pre HD Renvela  2-3 ac tid Sensipar  60mg  qd Others: lipitor, lamisil       OP HD: [facility] MWF 4.5h   B450   137.5kg  2K bath   AVF  Heparin  none Missed 3 treatments in May Mircera 100 mcg q 2wks, last 5/19, due 6/02        Assessment/ Plan: Acute on chronic anemia: Hb 5.1 here on admission. Last Hb in April was 11.2. CT showed R perinephric bleed, per urology large clot in renal pelvis in R side. Has had 5 units of prbc's, Hb remains in the low 8s today. Urology is following. Per pmd.  ESRD: on HD MWF. HD tomorrow.  BP: takes midodrine 5 mg pre HD, cont here. Metoprolol  on hold given acute bleeding and borderline soft bp's.  Volume: had 3 L UF on HD. Post-hd wt was at dry wt of 137 kg. 1 L UF w/ HD yesterday.  Euvolemic on exam.  Anemia of esrd: last esa at OP unit was 5/19, was due on 6/02. Will order darbe 100mcg weekly sq while here.  Secondary hyperparathyroidism: CCa in range, phos slightly up, cont binders if eating.     Larry Poag MD  [facility] 01/19/2024, 1:03 PM  Recent Labs  Lab 01/16/24 1317 01/17/24 0202 01/17/24 0845 01/17/24 1947 01/18/24 0418 01/18/24 1436 01/19/24 0447  HGB  --    < > 6.8*   < > 8.3* 8.4* 8.2*  ALBUMIN   --   --  2.4*  --   --   --   2.4*  CALCIUM   --   --  9.1  --  9.1  --  8.7*  PHOS 6.7*  --   --   --   --   --  3.5  CREATININE  --   --  16.77*  --  10.06*  --  6.13*  K  --   --  4.3  --  4.4  --  3.6   < > = values in this interval not displayed.   Recent Labs  Lab 01/17/24 0202  IRON 41*  TIBC 175*  FERRITIN 1,795*   Inpatient medications:  atorvastatin   40 mg Oral Daily   Chlorhexidine  Gluconate Cloth  6 each Topical Q0600   Chlorhexidine  Gluconate Cloth  6 each Topical Q0600   cinacalcet   60 mg Oral Daily   midodrine  5 mg Oral Q M,W,F-HD   mupirocin  ointment  1 Application Nasal BID   polyethylene glycol  17 g Oral Daily   sevelamer  carbonate  3,200 mg Oral TID AC     guaiFENesin -dextromethorphan

## 2024-01-19 NOTE — Progress Notes (Signed)
   01/19/24 0140  Vitals  Temp 98.5 F (36.9 C)  BP 106/73  BP Location Right Arm  BP Method Automatic  Patient Position (if appropriate) Lying  Pulse Rate 91  Pulse Rate Source Monitor  Resp (!) 25  Oxygen Therapy  SpO2 100 %  O2 Device Nasal Cannula  O2 Flow Rate (L/min) 2 L/min  Patient Activity (if Appropriate) In bed  Pulse Oximetry Type Continuous  During Treatment Monitoring  Blood Flow Rate (mL/min) 0 mL/min  Arterial Pressure (mmHg) -144.03 mmHg  Venous Pressure (mmHg) 190.7 mmHg  TMP (mmHg) 19.59 mmHg  Ultrafiltration Rate (mL/min) 807 mL/min  Dialysate Flow Rate (mL/min) 300 ml/min  Dialysate Potassium Concentration 3  Dialysate Calcium  Concentration 2.5  Duration of HD Treatment -hour(s) 3.5 hour(s)  Cumulative Fluid Removed (mL) per Treatment  900.13  HD Safety Checks Performed Yes  Intra-Hemodialysis Comments Tx completed  Post Treatment  Dialyzer Clearance Lightly streaked  Liters Processed 72  Fluid Removed (mL) 1000 mL  Tolerated HD Treatment Yes  AVG/AVF Arterial Site Held (minutes) 12 minutes  AVG/AVF Venous Site Held (minutes) 12 minutes  Fistula / Graft Left Forearm Arteriovenous fistula  Placement Date/Time: (c) 05/24/14 (c) 1026   Placed prior to admission: No  Orientation: Left  Access Location: Forearm  Access Type: (c) Arteriovenous fistula  Site Condition No complications  Fistula / Graft Assessment Present;Thrill;Bruit  Status Deaccessed  Needle Size 15  Drainage Description None   Set change mid treatment due to clotting.

## 2024-01-20 ENCOUNTER — Other Ambulatory Visit (HOSPITAL_COMMUNITY): Payer: Self-pay

## 2024-01-20 DIAGNOSIS — D62 Acute posthemorrhagic anemia: Secondary | ICD-10-CM | POA: Diagnosis not present

## 2024-01-20 DIAGNOSIS — K921 Melena: Secondary | ICD-10-CM | POA: Diagnosis not present

## 2024-01-20 DIAGNOSIS — I5043 Acute on chronic combined systolic (congestive) and diastolic (congestive) heart failure: Secondary | ICD-10-CM | POA: Diagnosis not present

## 2024-01-20 DIAGNOSIS — N186 End stage renal disease: Secondary | ICD-10-CM | POA: Diagnosis not present

## 2024-01-20 LAB — CBC
HCT: 28 % — ABNORMAL LOW (ref 39.0–52.0)
Hemoglobin: 8.9 g/dL — ABNORMAL LOW (ref 13.0–17.0)
MCH: 30.8 pg (ref 26.0–34.0)
MCHC: 31.8 g/dL (ref 30.0–36.0)
MCV: 96.9 fL (ref 80.0–100.0)
Platelets: 316 10*3/uL (ref 150–400)
RBC: 2.89 MIL/uL — ABNORMAL LOW (ref 4.22–5.81)
RDW: 16 % — ABNORMAL HIGH (ref 11.5–15.5)
WBC: 5.8 10*3/uL (ref 4.0–10.5)
nRBC: 0 % (ref 0.0–0.2)

## 2024-01-20 NOTE — Progress Notes (Signed)
  [facility] Progress Note  Subjective:  Seen in room Hb stable 8.9 Going home today    Vitals:   01/19/24 1605 01/19/24 2100 01/20/24 0000 01/20/24 0817  BP: 121/71 (!) 99/59 130/68 110/66  Pulse: 91 89 91 (!) 102  Resp: (!) 22 (!) 23 (!) 22 18  Temp: 98 F (36.7 C)  98.1 F (36.7 C) 98.8 F (37.1 C)  TempSrc: Oral  Oral Oral  SpO2: 96% 96% 94% 93%  Weight:      Height:        Exam: Gen alert, no distress, obese No jvd or bruits Chest clear bilat to bases RRR no MRG Abd soft ntnd no mass or ascites +bs Ext trace-1+ bilat pretib edema Neuro is alert, Ox 3 , nf    LFA AVF+bruit           Renal-related home meds: Lopressor  12.5 bid Midodrine 5mg  mwf pre HD Renvela  2-3 ac tid Sensipar  60mg  qd Others: lipitor, lamisil       OP HD: [facility] MWF 4.5h   B450   137.5kg  2K bath   AVF  Heparin  none Missed 3 treatments in May Mircera 100 mcg q 2wks, last 5/19, due 6/02        Assessment/ Plan: Acute on chronic anemia: Hb 5.1 here on admission. Last Hb in April was 11.2. CT showed R perinephric bleed, per urology large clot in renal pelvis in R side. S/P 5 units of prbc's. Hb 8.9, up from yesterday. Flank pain better. Pt to be dc'd today.   ESRD: on HD MWF. Just had HD here yest and refusing HD today. Says he will call his outpt unit about adding him on for tomorrow.  BP: takes midodrine 5 mg pre HD continue. Holding metoprolol  for now given soft BP's and acute bleed.  Volume: mild LE edema, otherwise euvolemic on exam. Keep dry wt.  Anemia of esrd: last esa at OP unit was 5/19, was due on 6/02. Will order darbe 100mcg weekly sq while here.  Secondary hyperparathyroidism: CCa in range, phos slightly up, cont binders if eating.  Dispo: for dc today.     Devin Poag MD  [facility] 01/20/2024, 11:51 AM  Recent Labs  Lab 01/16/24 1317 01/17/24 0202 01/17/24 0845 01/17/24 1947 01/18/24 0418 01/18/24 1436 01/19/24 0447 01/20/24 0619  HGB  --    < > 6.8*   < >  8.3*   < > 8.2* 8.9*  ALBUMIN   --   --  2.4*  --   --   --  2.4*  --   CALCIUM   --   --  9.1  --  9.1  --  8.7*  --   PHOS 6.7*  --   --   --   --   --  3.5  --   CREATININE  --   --  16.77*  --  10.06*  --  6.13*  --   K  --   --  4.3  --  4.4  --  3.6  --    < > = values in this interval not displayed.   Recent Labs  Lab 01/17/24 0202  IRON 41*  TIBC 175*  FERRITIN 1,795*   Inpatient medications:  atorvastatin   40 mg Oral Daily   Chlorhexidine  Gluconate Cloth  6 each Topical Q0600   cinacalcet   60 mg Oral Daily   darbepoetin (ARANESP ) injection - DIALYSIS  100 mcg Subcutaneous Q Thu-1800   midodrine  5  mg Oral Q M,W,F-HD   mupirocin  ointment  1 Application Nasal BID   polyethylene glycol  17 g Oral Daily   sevelamer  carbonate  3,200 mg Oral TID AC     guaiFENesin -dextromethorphan, oxyCODONE 

## 2024-01-20 NOTE — TOC Transition Note (Addendum)
 Transition of Care [facility]) - Discharge Note   Patient Details  Name: Devin Bullock MRN: [id number] Date of Birth: 1966-06-21  [facility] CM/SW Contact:  Eusebio High, RN Phone Number: 01/20/2024, 10:10 AM   Clinical Narrative:     Patient will DC to home today after HD. No TOC needs identified. Patient will follow up as directed on AVS     Cab Voucher given  Transportation form signed and on hard chart            Patient Goals and CMS Choice            Discharge Placement                       Discharge Plan and Services Additional resources added to the After Visit Summary for                                       Social Drivers of Health (SDOH) Interventions SDOH Screenings   Food Insecurity: Food Insecurity Present (01/16/2024)  Housing: Low Risk  (01/16/2024)  Transportation Needs: No Transportation Needs (01/16/2024)  Utilities: Not At Risk (01/16/2024)  Tobacco Use: Low Risk  (01/16/2024)     Readmission Risk Interventions     No data to display

## 2024-01-20 NOTE — Discharge Summary (Signed)
 PATIENT DETAILS Name: Devin Bullock Age: 58 y.o. Sex: male Date of Birth: 09-04-1965 MRN: [id number]. Admitting Physician: Jodeane Mulligan, DO MWN:[id number], Royston Cornea, MD  Admit Date: 01/16/2024 Discharge date: 01/20/2024  Recommendations for Outpatient Follow-up:  Follow up with PCP in 1-2 weeks Please obtain CMP/CBC in one week Please ensure follow up with urology at [facility]  Admitted From:  Home  Disposition: Home   Discharge Condition: good  CODE STATUS:   Code Status: Full Code   Diet recommendation:  Diet Order             Diet - low sodium heart healthy           Diet renal with fluid restriction Fluid restriction: 1500 mL Fluid; Room service appropriate? Yes; Fluid consistency: Thin  Diet effective now                    Brief Summary: Patient is a 58 y.o.  male with history of ESRD on HD MWF, HFrEF with recovered EF, prostate/bladder cancer-who presented from hemodialysis center for evaluation of Hb of 5.1-he was found to have a right perinephric hematoma.   Significant events: 6/2>> to [facility]   Significant studies: 6/2>> CT renal stone study: Right perirenal retroperitoneal hematoma with blood clots in right renal pelvis/proximal ureter.   Significant microbiology data: None   Procedures: None   Consults: Nephrology Urology GI  Brief Hospital Course: Acute blood loss anemia secondary to spontaneous right perinephric hematoma Managed with just supportive care-did require a total of 4 units of PRBC-last transfused on 6/3 Hb stable for the past 3-4 days without any significant drop. Followed closely by urology-with recommendations to continue supportive care-and if patient were to worsen/significant drop in hemoglobin-to proceed with CT angiogram and IR evaluation.  Thankfully patient has stabilized-urology recommending patient follow-up with his outpatient urologist at [facility].     Hematuria Secondary to  perinephric hematoma (per CT-some blood in renal pelvis as well)-possible underlying bladder tumor as well. Overall much improved-frequency/severity of hematuria has decreased significantly compared to how he first presented to the hospital.  However he still has some intermittent hematuria-with stable hemoglobin-plan is to continue to monitor closely in the outpatient setting.  He will need to keep his existing appointment with urology at [facility] next week for definite treatment of his bladder/kidney issues.   Hematochezia Chronic issue Per GI-continue supportive care-this is a chronic issue-and is scheduled for colonoscopy on 6/19. Hb stable as noted above.   History of prostate/bladder cancer with hematuria Cystoscopy with resection scheduled for 6/11-at [facility] urology.  He has been asked to keep this appointment-schedule follow-up as previously planned.   ESRD on HD MWF Nephrology followed closely.   HFrEF exacerbation Volume status improved after HD   Hypotension Chronic issue Midodrine with hemodialysis (metoprolol  remains on hold-PCP to reassess and resume accordingly)   HLD Statin   Class 2 Obesity: Estimated body mass index is 38.43 kg/m as calculated from the following:   Height as of this encounter: 6\' 4"  (1.93 m).   Weight as of this encounter: 143.2 kg.   Discharge Diagnoses:  Principal Problem:   Acute on chronic blood loss anemia Active Problems:   Acute on chronic combined systolic and diastolic CHF (congestive heart failure) (HCC)   Morbid obesity (HCC)   ESRD (end stage renal disease) on dialysis (HCC)   GI bleed   Retroperitoneal bleed   Retroperitoneal  hematoma   Hematochezia   Discharge Instructions:  Activity:  As tolerated   Discharge Instructions     Diet - low sodium heart healthy   Complete by: As directed    Discharge instructions   Complete by: As directed    Follow with Primary MD  Deterding, Royston Cornea,  MD in 1-2 weeks  Please follow-up with Dr. Nolon Baxter at [facility] your appointment next week.  Please get a complete blood count and chemistry panel checked by your Primary MD at your next visit, and again as instructed by your Primary MD.  Get Medicines reviewed and adjusted: Please take all your medications with you for your next visit with your Primary MD  Laboratory/radiological data: Please request your Primary MD to go over all hospital tests and procedure/radiological results at the follow up, please ask your Primary MD to get all Hospital records sent to his/her office.  In some cases, they will be blood work, cultures and biopsy results pending at the time of your discharge. Please request that your primary care M.D. follows up on these results.  Also Note the following: If you experience worsening of your admission symptoms, develop shortness of breath, life threatening emergency, suicidal or homicidal thoughts you must seek medical attention immediately by calling 911 or calling your MD immediately  if symptoms less severe.  You must read complete instructions/literature along with all the possible adverse reactions/side effects for all the Medicines you take and that have been prescribed to you. Take any new Medicines after you have completely understood and accpet all the possible adverse reactions/side effects.   Do not drive when taking Pain medications or sleeping medications (Benzodaizepines)  Do not take more than prescribed Pain, Sleep and Anxiety Medications. It is not advisable to combine anxiety,sleep and pain medications without talking with your primary care practitioner  Special Instructions: If you have smoked or chewed Tobacco  in the last 2 yrs please stop smoking, stop any regular Alcohol  and or any Recreational drug use.  Wear Seat belts while driving.  Please note: You were cared for by a hospitalist during your hospital stay. Once you  are discharged, your primary care physician will handle any further medical issues. Please note that NO REFILLS for any discharge medications will be authorized once you are discharged, as it is imperative that you return to your primary care physician (or establish a relationship with a primary care physician if you do not have one) for your post hospital discharge needs so that they can reassess your need for medications and monitor your lab values.   Increase activity slowly   Complete by: As directed       Allergies as of 01/20/2024   No Known Allergies      Medication List     PAUSE taking these medications    terbinafine  250 MG tablet Wait to take this until your doctor or other care provider tells you to start again. Commonly known as: LAMISIL  TAKE 1 TABLET BY MOUTH EVERY DAY       STOP taking these medications    cefpodoxime  200 MG tablet Commonly known as: VANTIN    metoprolol  tartrate 25 MG tablet Commonly known as: LOPRESSOR        TAKE these medications    atorvastatin  40 MG tablet Commonly known as: LIPITOR Take 1 tablet (40 mg total) by mouth daily.   B-Complex/Folic Acid/Vitamin C Tbcr Take 1 tablet by mouth daily.   Rena-Vite Rx 1 MG  Tabs Take 1 tablet by mouth daily.   cinacalcet  60 MG tablet Commonly known as: SENSIPAR  Take 60 mg by mouth daily.   guaifenesin  100 MG/5ML syrup Commonly known as: ROBITUSSIN Take 200 mg by mouth 3 (three) times daily as needed for cough.   guaiFENesin  600 MG 12 hr tablet Commonly known as: MUCINEX  Take 1 tablet (600 mg total) by mouth 2 (two) times daily as needed for cough or to loosen phlegm.   midodrine 5 MG tablet Commonly known as: PROAMATINE Take 5 mg by mouth. Taking Monday, Wednesday and Friday at dialysis   MIRCERA IJ Mircera   sevelamer  carbonate 800 MG tablet Commonly known as: RENVELA  Take 2,400-3,200 mg by mouth 2 (two) times daily.        No Known Allergies   Other  Procedures/Studies: CT Renal Stone Study Result Date: 01/16/2024 CLINICAL DATA:  Abdominal pain flank pain EXAM: CT ABDOMEN AND PELVIS WITHOUT CONTRAST TECHNIQUE: Multidetector CT imaging of the abdomen and pelvis was performed following the standard protocol without IV contrast. RADIATION DOSE REDUCTION: This exam was performed according to the departmental dose-optimization program which includes automated exposure control, adjustment of the mA and/or kV according to patient size and/or use of iterative reconstruction technique. COMPARISON:  December 13, 2023 FINDINGS: Lower chest: Minimal left lower lobe hypoventilatory atelectasis, subtle infiltrates? Correlate clinically. No consolidations. Minute pericardial prominence with perhaps minute amount of pericardial fluid. Hepatobiliary: Comparison with prior examinations demonstrates no significant change in the multiple hepatic cysts throughout the right lobe of the liver. No new findings. Pancreas: Unremarkable. No pancreatic ductal dilatation or surrounding inflammatory changes. Spleen: Normal in size without focal abnormality. Adrenals/Urinary Tract: Comparison with prior examination accounting for differences in techniques the right kidney appears larger than on prior examination. High areas of attenuation within the mid and lower pole calices and renal pelvis with a dilated right proximal ureter to the iliac vessel crossing are consistent with hematuria and blood clots. No obvious kidney stones are identified in the trajectory of the ureter. There is stranding surrounding the right kidney and the perirenal fascia as well as a poorly organized retroperitoneal density that measures 10 x 8.9 by 13 cm in the AP, transverse and longitudinal diameter consistent with retroperitoneal hematoma. Findings are consistent with the perirenal retroperitoneal hematoma. No obvious masses are identified. Small calcific densities are noted surrounding the renal pelvis which are  seen on the prior study and correlate perhaps with calcifications of some vascular structures. Comparison with prior examinations demonstrates no significant change in the appearance of the left kidney with multiple cortical cysts and significant cortical atrophy as well as some dystrophic calcifications without evidence of lithiasis or hydronephrosis. Stomach/Bowel: Stomach is within normal limits. Appendix appears normal. No evidence of bowel wall thickening, distention, or inflammatory changes. Vascular/Lymphatic: No significant vascular findings are present. No enlarged abdominal or pelvic lymph nodes. Reproductive: Prostate is unremarkable. Other: No abdominal wall hernia or abnormality. No abdominopelvic ascites. Musculoskeletal: No acute or significant osseous findings. IMPRESSION: *Findings consistent with right perirenal retroperitoneal hematoma with blood clots in the right renal pelvis and proximal ureter. *No obvious kidney stones are identified. *No significant change in the appearance of the left kidney with multiple cortical cysts and significant cortical atrophy as well as some dystrophic calcifications without evidence of lithiasis or hydronephrosis. *No significant change in the multiple hepatic cysts. *Minimal left lower lobe hypoventilatory atelectasis, subtle infiltrates? Minute pericardial prominence with perhaps minute amount of pericardial fluid. Electronically Signed   By:  Fredrich Jefferson M.D.   On: 01/16/2024 12:49     TODAY-DAY OF DISCHARGE:  Subjective:   Devin Bullock today has no headache,no chest abdominal pain,no new weakness tingling or numbness, feels much better wants to go home today.   Objective:   Blood pressure 110/66, pulse (!) 102, temperature 98.8 F (37.1 C), temperature source Oral, resp. rate 18, height 6\' 4"  (1.93 m), weight (!) 143.2 kg, SpO2 93%.  Intake/Output Summary (Last 24 hours) at 01/20/2024 0942 Last data filed at 01/19/2024 1500 Gross per 24 hour   Intake --  Output 85 ml  Net -85 ml   Filed Weights   01/17/24 0845 01/17/24 1313 01/18/24 2118  Weight: (!) 140.2 kg (!) 137 kg (!) 143.2 kg    Exam: Awake Alert, Oriented *3, No new F.N deficits, Normal affect [place].AT,PERRAL Supple Neck,No JVD, No cervical lymphadenopathy appriciated.  Symmetrical Chest wall movement, Good air movement bilaterally, CTAB RRR,No Gallops,Rubs or new Murmurs, No Parasternal Heave +ve B.Sounds, Abd Soft, Non tender, No organomegaly appriciated, No rebound -guarding or rigidity. No Cyanosis, Clubbing or edema, No new Rash or bruise   PERTINENT RADIOLOGIC STUDIES: No results found.   PERTINENT LAB RESULTS: CBC: Recent Labs    01/19/24 0447 01/20/24 0619  WBC 5.3 5.8  HGB 8.2* 8.9*  HCT 25.2* 28.0*  PLT 277 316   CMET CMP     Component Value Date/Time   NA 135 01/19/2024 0447   K 3.6 01/19/2024 0447   CL 98 01/19/2024 0447   CO2 29 01/19/2024 0447   GLUCOSE 115 (H) 01/19/2024 0447   BUN 23 (H) 01/19/2024 0447   CREATININE 6.13 (H) 01/19/2024 0447   CREATININE 4.03 (H) 06/13/2014 1742   CALCIUM  8.7 (L) 01/19/2024 0447   CALCIUM  8.1 (L) 04/17/2013 1230   PROT 6.1 (L) 01/17/2024 0845   ALBUMIN  2.4 (L) 01/19/2024 0447   AST 19 01/17/2024 0845   ALT 16 01/17/2024 0845   ALKPHOS 55 01/17/2024 0845   BILITOT 0.7 01/17/2024 0845   GFRNONAA 10 (L) 01/19/2024 0447   GFRNONAA 16 (L) 06/13/2014 1742    GFR Estimated Creatinine Clearance: 20.1 mL/min (A) (by C-G formula based on SCr of 6.13 mg/dL (H)). No results for input(s): "LIPASE", "AMYLASE" in the last 72 hours. No results for input(s): "CKTOTAL", "CKMB", "CKMBINDEX", "TROPONINI" in the last 72 hours. Invalid input(s): "POCBNP" No results for input(s): "DDIMER" in the last 72 hours. No results for input(s): "HGBA1C" in the last 72 hours. No results for input(s): "CHOL", "HDL", "LDLCALC", "TRIG", "CHOLHDL", "LDLDIRECT" in the last 72 hours. No results for input(s): "TSH",  "T4TOTAL", "T3FREE", "THYROIDAB" in the last 72 hours.  Invalid input(s): "FREET3" No results for input(s): "VITAMINB12", "FOLATE", "FERRITIN", "TIBC", "IRON", "RETICCTPCT" in the last 72 hours. Coags: Recent Labs    01/17/24 1947  INR 1.2   Microbiology: Recent Results (from the past 240 hours)  MRSA Next Gen by PCR, Nasal     Status: Abnormal   Collection Time: 01/16/24  7:53 PM   Specimen: Nasal Mucosa; Nasal Swab  Result Value Ref Range Status   MRSA by PCR Next Gen DETECTED (A) NOT DETECTED Final    Comment: RESULT CALLED TO, READ BACK BY AND VERIFIED WITH: A LOWE RN 01/16/2024 @ 2135 BY AB (NOTE) The GeneXpert MRSA Assay (FDA approved for NASAL specimens only), is one component of a comprehensive MRSA colonization surveillance program. It is not intended to diagnose MRSA infection nor to guide or monitor treatment for  MRSA infections. Test performance is not FDA approved in patients less than 62 years old. Performed at [facility], [address], [place], [place] [address]     FURTHER DISCHARGE INSTRUCTIONS:  Get Medicines reviewed and adjusted: Please take all your medications with you for your next visit with your Primary MD  Laboratory/radiological data: Please request your Primary MD to go over all hospital tests and procedure/radiological results at the follow up, please ask your Primary MD to get all Hospital records sent to his/her office.  In some cases, they will be blood work, cultures and biopsy results pending at the time of your discharge. Please request that your primary care M.D. goes through all the records of your hospital data and follows up on these results.  Also Note the following: If you experience worsening of your admission symptoms, develop shortness of breath, life threatening emergency, suicidal or homicidal thoughts you must seek medical attention immediately by calling 911 or calling your MD immediately  if symptoms less severe.  You  must read complete instructions/literature along with all the possible adverse reactions/side effects for all the Medicines you take and that have been prescribed to you. Take any new Medicines after you have completely understood and accpet all the possible adverse reactions/side effects.   Do not drive when taking Pain medications or sleeping medications (Benzodaizepines)  Do not take more than prescribed Pain, Sleep and Anxiety Medications. It is not advisable to combine anxiety,sleep and pain medications without talking with your primary care practitioner  Special Instructions: If you have smoked or chewed Tobacco  in the last 2 yrs please stop smoking, stop any regular Alcohol  and or any Recreational drug use.  Wear Seat belts while driving.  Please note: You were cared for by a hospitalist during your hospital stay. Once you are discharged, your primary care physician will handle any further medical issues. Please note that NO REFILLS for any discharge medications will be authorized once you are discharged, as it is imperative that you return to your primary care physician (or establish a relationship with a primary care physician if you do not have one) for your post hospital discharge needs so that they can reassess your need for medications and monitor your lab values.  Total Time spent coordinating discharge including counseling, education and face to face time equals greater than 30 minutes.  SignedKimberly Penna 01/20/2024 9:42 AM

## 2024-01-20 NOTE — Discharge Planning (Signed)
 [facility] Patient Discharge Orders- [facility] CLINIC: [facility]  Patient's name: Devin Bullock Admit/DC Dates: 01/16/2024 - 01/20/2024  Discharge Diagnoses: Acute on chronic anemia   Spontaneous right perinephric hematoma Hematuria  Aranesp : Given: yes   Date and amount of last dose: 01/19/24  Last Hgb: 8.9 PRBC's Given: yes Date/# of units: 2 units 01/16/24, 1 unit 01/17/24, 2 units 01/17/24 ESA dose for discharge: mircera 150 mcg IV q 2 weeks  IV Iron dose at discharge: none, per protocol  Heparin  change: none  EDW Change: no New EDW:   [place] Change: no  Access intervention/Change: no Details:  Hectorol/Calcitriol  change: no  Discharge Labs: Calcium  8.7 Phosphorus 3.5 Albumin  2.4 K+ 3.6  IV Antibiotics: no Details:  On Coumadin?: no Last INR: Next INR: Managed By:   OTHER/APPTS/LAB ORDERS:    D/C Meds to be reconciled by nurse after every discharge.  Completed By: Ramona Burner, PA-C 01/20/2024, 1:29 PM  [facility] Pager: [phone]    Reviewed by: MD:______ RN_______

## 2024-01-20 NOTE — Plan of Care (Signed)

## 2024-01-21 ENCOUNTER — Telehealth: Payer: Self-pay | Admitting: Nephrology

## 2024-01-21 NOTE — Telephone Encounter (Signed)
 Transition of Care Contact from Inpatient Facility  Date of Discharge: 01/20/24 Date of Contact: 01/21/24 Method of contact: phone - attempted  Attempted to contact patient to discuss transition of care from inpatient admission.  Patient did not answer the phone.  Will attempt to call them again or will follow up at dialysis.  Charlotte Cookey, PA-C

## 2024-01-24 ENCOUNTER — Other Ambulatory Visit: Payer: Self-pay

## 2024-01-24 ENCOUNTER — Telehealth: Payer: Self-pay

## 2024-01-24 NOTE — Telephone Encounter (Addendum)
-----   Message ----- From: Jodi Munroe, PA-C Sent: 01/18/2024   2:18 PM EDT To: Kenney Peacemaker, MD; Florentina Huntsman, LPN; # Subject: reschedule Colonoscopy                         I believe this is Dr. Les Rao patient, but Dr. Willy Harvest was to do colonoscopy on him at the hospital on 02/02/2024 due to end-stage renal disease on dialysis.  This is for complaint of chronic rectal bleeding low-grade  Has been hospitalized this week with profound anemia hemoglobin of 5-found to have a large right perinephric hematoma and also has been having gross hematuria from a recently diagnosed bladder tumor  Due to the large right perinephric hematoma feel it is best that he not have colonoscopy for 6 to 8 weeks to allow time for resolution of this.  Can you please get this patient rescheduled for colonoscopy at a later date-hospital case as end-stage renal disease with dialysis-  Thank you, Amy  I cannot get the [facility] GI pod B thing to come up so I just attached nurse Beth:)

## 2024-01-24 NOTE — Telephone Encounter (Signed)
 Spoke with the patient. Letter with appointment dates mailed to the patient.

## 2024-03-09 ENCOUNTER — Encounter (HOSPITAL_COMMUNITY): Payer: Self-pay | Admitting: *Deleted

## 2024-03-15 ENCOUNTER — Telehealth: Payer: Self-pay | Admitting: *Deleted

## 2024-03-15 ENCOUNTER — Ambulatory Visit

## 2024-03-15 NOTE — Progress Notes (Unsigned)
 Pt's name and DOB verified at the beginning of the pre-visit with 2 identifiers  Pt denies any difficulty with ambulating,sitting, laying down or rolling side to side   Pt uses ambulation assistance device or has issues with mobiiity  Pt has no issues moving head neck or swallowing  No egg or soy allergy known to patient   No issues known to pt with past sedation with any surgeries or procedures  Patient denies ever being intubated  No FH of Malignant Hyperthermia  Pt is not on home 02   Pt is not on blood thinners   Pt denies issues with constipation   Pt has frequent issues with constipation RN instructed pt to use Miralax  per bottles instructions a week before prep days. Pt states they will  Pt is not on dialysis  Pt denise any abnormal heart rhythms   Pt denies any upcoming cardiac testing  Patient's chart reviewed by Norleen Schillings CNRA prior to pre-visit and patient appropriate for the [facility].  Pre-visit completed and red dot placed by patient's name on their procedure day (on provider's schedule).     Visit by phone  Pt states weight is 300 lb   IInstructions reviewed. Pt given  both [facility] main # and MD on call # prior to instructions.  Informed pt to come inat the time discussed and that the procedure and that it is an hour prior to the actual procedure time due to need to place IV, change into gown, check and sign concent as well as meet CRNA and MD. Pt states they understand. paperwork s Pt states understanding after opportunity to ask questions the instructions given. Instructed pt to review instructions again prior to procedure and call main # given if has any questions.. Pt states they will.   Instructed pt on where to find instructions on My Chart.

## 2024-03-19 NOTE — Telephone Encounter (Signed)
 Noted

## 2024-03-19 NOTE — Telephone Encounter (Signed)
 OV scheduled on 05/01/24 with Rolley, NP. I did not see any open slots with Dr Federico

## 2024-03-19 NOTE — Telephone Encounter (Signed)
 8/14 hospital case has been cancelled. Will patient need to come in for an office visit or OK to reschedule direct? If so, please advise on timing .Thanks

## 2024-03-29 ENCOUNTER — Ambulatory Visit (HOSPITAL_COMMUNITY): Admission: RE | Admit: 2024-03-29 | Source: Home / Self Care | Admitting: Internal Medicine

## 2024-03-29 SURGERY — COLONOSCOPY
Anesthesia: Monitor Anesthesia Care

## 2024-04-08 ENCOUNTER — Emergency Department (HOSPITAL_COMMUNITY)
Admission: EM | Admit: 2024-04-08 | Discharge: 2024-04-09 | Disposition: A | Discharge status: Home / Self Care | Attending: Emergency Medicine | Admitting: Emergency Medicine

## 2024-04-08 ENCOUNTER — Emergency Department (HOSPITAL_COMMUNITY)

## 2024-04-08 ENCOUNTER — Encounter (HOSPITAL_COMMUNITY): Payer: Self-pay | Admitting: Emergency Medicine

## 2024-04-08 DIAGNOSIS — Z992 Dependence on renal dialysis: Secondary | ICD-10-CM | POA: Insufficient documentation

## 2024-04-08 DIAGNOSIS — I471 Supraventricular tachycardia, unspecified: Secondary | ICD-10-CM | POA: Insufficient documentation

## 2024-04-08 DIAGNOSIS — Z8551 Personal history of malignant neoplasm of bladder: Secondary | ICD-10-CM | POA: Insufficient documentation

## 2024-04-08 DIAGNOSIS — N186 End stage renal disease: Secondary | ICD-10-CM | POA: Insufficient documentation

## 2024-04-08 DIAGNOSIS — Z8546 Personal history of malignant neoplasm of prostate: Secondary | ICD-10-CM | POA: Insufficient documentation

## 2024-04-08 DIAGNOSIS — R0602 Shortness of breath: Secondary | ICD-10-CM | POA: Diagnosis present

## 2024-04-08 DIAGNOSIS — R310 Gross hematuria: Secondary | ICD-10-CM | POA: Insufficient documentation

## 2024-04-08 DIAGNOSIS — I509 Heart failure, unspecified: Secondary | ICD-10-CM | POA: Insufficient documentation

## 2024-04-08 DIAGNOSIS — Z79899 Other long term (current) drug therapy: Secondary | ICD-10-CM | POA: Diagnosis not present

## 2024-04-08 DIAGNOSIS — I132 Hypertensive heart and chronic kidney disease with heart failure and with stage 5 chronic kidney disease, or end stage renal disease: Secondary | ICD-10-CM | POA: Insufficient documentation

## 2024-04-08 LAB — CBC
HCT: 28.8 % — ABNORMAL LOW (ref 39.0–52.0)
Hemoglobin: 8.9 g/dL — ABNORMAL LOW (ref 13.0–17.0)
MCH: 30.7 pg (ref 26.0–34.0)
MCHC: 30.9 g/dL (ref 30.0–36.0)
MCV: 99.3 fL (ref 80.0–100.0)
Platelets: 188 K/uL (ref 150–400)
RBC: 2.9 MIL/uL — ABNORMAL LOW (ref 4.22–5.81)
RDW: 15.9 % — ABNORMAL HIGH (ref 11.5–15.5)
WBC: 6.6 K/uL (ref 4.0–10.5)
nRBC: 0 % (ref 0.0–0.2)

## 2024-04-08 LAB — COMPREHENSIVE METABOLIC PANEL WITH GFR
ALT: 12 U/L (ref 0–44)
AST: 25 U/L (ref 15–41)
Albumin: 3.5 g/dL (ref 3.5–5.0)
Alkaline Phosphatase: 115 U/L (ref 38–126)
Anion gap: 19 — ABNORMAL HIGH (ref 5–15)
BUN: 65 mg/dL — ABNORMAL HIGH (ref 6–20)
CO2: 26 mmol/L (ref 22–32)
Calcium: 9 mg/dL (ref 8.9–10.3)
Chloride: 94 mmol/L — ABNORMAL LOW (ref 98–111)
Creatinine, Ser: 14.97 mg/dL — ABNORMAL HIGH (ref 0.61–1.24)
GFR, Estimated: 3 mL/min — ABNORMAL LOW (ref >60)
Glucose, Bld: 121 mg/dL — ABNORMAL HIGH (ref 70–99)
Potassium: 4.1 mmol/L (ref 3.5–5.1)
Sodium: 139 mmol/L (ref 135–145)
Total Bilirubin: 0.4 mg/dL (ref 0.0–1.2)
Total Protein: 6.9 g/dL (ref 6.5–8.1)

## 2024-04-08 LAB — PROTIME-INR
INR: 1.1 (ref 0.8–1.2)
Prothrombin Time: 15.2 s (ref 11.4–15.2)

## 2024-04-08 LAB — I-STAT CHEM 8, ED
BUN: 61 mg/dL — ABNORMAL HIGH (ref 6–20)
Calcium, Ion: 0.98 mmol/L — ABNORMAL LOW (ref 1.15–1.40)
Chloride: 97 mmol/L — ABNORMAL LOW (ref 98–111)
Creatinine, Ser: 15 mg/dL — ABNORMAL HIGH (ref 0.61–1.24)
Glucose, Bld: 117 mg/dL — ABNORMAL HIGH (ref 70–99)
HCT: 27 % — ABNORMAL LOW (ref 39.0–52.0)
Hemoglobin: 9.2 g/dL — ABNORMAL LOW (ref 13.0–17.0)
Potassium: 4.1 mmol/L (ref 3.5–5.1)
Sodium: 140 mmol/L (ref 135–145)
TCO2: 28 mmol/L (ref 22–32)

## 2024-04-08 MED ORDER — IOHEXOL 350 MG/ML SOLN
75.0000 mL | Freq: Once | INTRAVENOUS | Status: AC | PRN
  Administered 2024-04-08: 75 mL via INTRAVENOUS

## 2024-04-08 NOTE — ED Provider Notes (Signed)
 [place] EMERGENCY DEPARTMENT AT [facility] Provider Note   CSN: [id number] Arrival date & time: 04/08/24  2200     Patient presents with: No chief complaint on file.   Devin Bullock is a 58 y.o. male with history of prostate and bladder cancer currently undergoing chemotherapy, ESRD on HD x 7 years Monday/Wednesday/Friday, CHF, hypertension, SVT in the past, retroperitoneal hematoma secondary to malignancy.  He presents today with concern for 4 days of hematuria which he describes as similar to his prior retroperitoneal bleeding. He has this intermittently secondary to his bladder malignancy. He states that due to the bleeding he was turned away from his dialysis session on Friday, last dialysis session was 4 days ago on Wednesday.  He endorses consistent compliance with his dialysis sessions.  He reports feeling short of breath and lightheaded, denies palpitations or chest pain.  He is not anticoagulated or any antiplatelet therapy.  Makes no urine at baseline but has been having sensation to urinate recently and is having 2-3 episodes of gross hematuria with clots per urethra per day. Level 5 caveat due to acuity of presentation upon arrival.  Concern for SVT with heart rate in the 160s. The patient follows with urology at [facility] for both nonmuscle invasive bladder cancer as well as high risk adenocarcinoma state. Scheduled for cystoscopy on 9/4, holding chemo until that time.    HPI     Prior to Admission medications   Medication Sig Start Date End Date Taking? Authorizing Provider  atorvastatin  (LIPITOR) 40 MG tablet Take 1 tablet (40 mg total) by mouth daily. 05/25/23   Pearlean Manus, MD  B Complex-C-Folic Acid (B-COMPLEX/FOLIC ACID/VITAMIN C) TBCR Take 1 tablet by mouth daily. 11/02/23   [provider]  B Complex-C-Folic Acid (RENA-VITE RX) 1 MG TABS Take 1 tablet by mouth daily. 11/21/23   [provider]  cinacalcet  (SENSIPAR ) 60 MG tablet Take 60 mg by  mouth daily.    [provider]  guaiFENesin  (MUCINEX ) 600 MG 12 hr tablet Take 1 tablet (600 mg total) by mouth 2 (two) times daily as needed for cough or to loosen phlegm. 05/25/23   Pearlean Manus, MD  guaifenesin  (ROBITUSSIN) 100 MG/5ML syrup Take 200 mg by mouth 3 (three) times daily as needed for cough.    [provider]  hydrocortisone  (ANUSOL -HC) 25 MG suppository Place 1 suppository (25 mg total) rectally at bedtime Patient not taking: Reported on 03/15/2024    [provider]  Methoxy PEG-Epoetin  Beta (MIRCERA IJ) Mircera 10/17/23 10/15/24  [provider]  midodrine  (PROAMATINE ) 5 MG tablet Take 5 mg by mouth. Taking Monday, Wednesday and Friday at dialysis 10/07/23   [provider]  sevelamer  (RENAGEL ) 800 MG tablet  05/04/21   [provider]  sevelamer  carbonate (RENVELA ) 800 MG tablet Take 2,400-3,200 mg by mouth 2 (two) times daily. 05/17/23   [provider]  terbinafine  (LAMISIL ) 250 MG tablet TAKE 1 TABLET BY MOUTH EVERY DAY Patient not taking: Reported on 03/15/2024 12/22/23   Hyatt, Max T, DPM  trospium (SANCTURA) 20 MG tablet Take 20 mg by mouth. 03/14/24   [provider]  Vitamin D , Ergocalciferol , (DRISDOL) 1.25 MG (50000 UNIT) CAPS capsule Take by mouth. 09/06/22   [provider]    Allergies: Patient has no known allergies.    Review of Systems  Genitourinary:  Positive for frequency, genital sores and hematuria. Negative for decreased urine volume, dysuria, penile discharge, penile pain, penile swelling, scrotal  swelling and testicular pain.    Updated Vital Signs BP 114/78   Pulse 68   Temp 97.7 F (36.5 C)   Resp 16   SpO2 98%   Physical Exam Vitals and nursing note reviewed.  Constitutional:      Appearance: He is obese. He is not ill-appearing or toxic-appearing.  HENT:     Head: Normocephalic and atraumatic.     Mouth/Throat:     Mouth: Mucous membranes are moist.     Pharynx:  No oropharyngeal exudate or posterior oropharyngeal erythema.  Eyes:     General:        Right eye: No discharge.        Left eye: No discharge.     Conjunctiva/sclera: Conjunctivae normal.  Cardiovascular:     Rate and Rhythm: Regular rhythm. Tachycardia present.     Pulses: Normal pulses.     Heart sounds: Murmur heard.  Pulmonary:     Effort: Pulmonary effort is normal. No respiratory distress.     Breath sounds: Normal breath sounds. No wheezing or rales.  Abdominal:     General: Bowel sounds are normal. There is no distension.     Palpations: Abdomen is soft.     Tenderness: There is no abdominal tenderness. There is no guarding or rebound.  Musculoskeletal:        General: No deformity.     Cervical back: Neck supple.     Right lower leg: Edema present.     Left lower leg: Edema present.  Skin:    General: Skin is warm and dry.     Capillary Refill: Capillary refill takes less than 2 seconds.  Neurological:     General: No focal deficit present.     Mental Status: He is alert and oriented to person, place, and time. Mental status is at baseline.  Psychiatric:        Mood and Affect: Mood normal.     (all labs ordered are listed, but only abnormal results are displayed) Labs Reviewed  CBC - Abnormal; Notable for the following components:      Result Value   RBC 2.90 (*)    Hemoglobin 8.9 (*)    HCT 28.8 (*)    RDW 15.9 (*)    All other components within normal limits  COMPREHENSIVE METABOLIC PANEL WITH GFR - Abnormal; Notable for the following components:   Chloride 94 (*)    Glucose, Bld 121 (*)    BUN 65 (*)    Creatinine, Ser 14.97 (*)    GFR, Estimated 3 (*)    Anion gap 19 (*)    All other components within normal limits  I-STAT CHEM 8, ED - Abnormal; Notable for the following components:   Chloride 97 (*)    BUN 61 (*)    Creatinine, Ser 15.00 (*)    Glucose, Bld 117 (*)    Calcium , Ion 0.98 (*)    Hemoglobin 9.2 (*)    HCT 27.0 (*)    All other  components within normal limits  PROTIME-INR  URINALYSIS, ROUTINE W REFLEX MICROSCOPIC  TYPE AND SCREEN    EKG: EKG Interpretation Date/Time:  Sunday April 08 2024 23:03:01 EDT Ventricular Rate:  97 PR Interval:  105 QRS Duration:  114 QT Interval:  367 QTC Calculation: 467 R Axis:   -41  Text Interpretation: Sinus rhythm Supraventricular bigeminy Short PR interval Left anterior fascicular block Abnormal R-wave progression, early transition Probable LVH with secondary repol abnrm Supraventricular  tachycardia RESOLVED SINCE PREVIOUS Confirmed by Doretha Folks ([id number]) on 04/08/2024 11:04:15 PM  Radiology: CT ABDOMEN PELVIS W CONTRAST Result Date: 04/08/2024 CLINICAL DATA:  History of prostate and bladder cancer, presenting with gross hematuria. EXAM: CT ABDOMEN AND PELVIS WITH CONTRAST TECHNIQUE: Multidetector CT imaging of the abdomen and pelvis was performed using the standard protocol following bolus administration of intravenous contrast. RADIATION DOSE REDUCTION: This exam was performed according to the departmental dose-optimization program which includes automated exposure control, adjustment of the mA and/or kV according to patient size and/or use of iterative reconstruction technique. CONTRAST:  75mL OMNIPAQUE  IOHEXOL  350 MG/ML SOLN COMPARISON:  January 16, 2024 FINDINGS: Lower chest: Stable, moderate severity left basilar scarring and atelectasis is seen. Hepatobiliary: Several small, stable simple hepatic cysts are noted. No gallstones, gallbladder wall thickening, or biliary dilatation. Pancreas: Unremarkable. No pancreatic ductal dilatation or surrounding inflammatory changes. Spleen: Normal in size without focal abnormality. Adrenals/Urinary Tract: Adrenal glands are unremarkable. The kidneys are atrophic with marked severity diffuse renal cortical thinning seen. Numerous stable bilateral simple renal cysts are noted. Stable, marked severity bilateral hydronephrosis is also present,  right greater than left. The proximal and mid portions of the right ureter are also markedly dilated and tortuous in appearance. No obstructing renal calculi are identified. There is mild right-sided perinephric inflammatory fat stranding. The urinary bladder is empty and subsequently limited in evaluation. Stomach/Bowel: Stomach is within normal limits. Appendix appears normal. No evidence of bowel wall thickening, distention, or inflammatory changes. Numerous noninflamed diverticula are seen throughout the large bowel. Vascular/Lymphatic: Aortic atherosclerosis. No enlarged abdominal or pelvic lymph nodes. Reproductive: Prostate is unremarkable. Other: No abdominal wall hernia or abnormality. No abdominopelvic ascites. Musculoskeletal: Diffusely sclerotic osseous structures are again seen, without evidence of acute osseous abnormality. IMPRESSION: 1. Stable, marked severity bilateral hydronephrosis, right greater than left, with marked severity right-sided hydroureter. 2. Renal atrophy with numerous stable bilateral simple renal cysts. 3. Colonic diverticulosis. 4. Stable, moderate severity left basilar scarring and atelectasis. 5. Aortic atherosclerosis. Electronically Signed   By: Suzen Dials M.D.   On: 04/08/2024 23:38   DG Chest Portable 1 View Result Date: 04/08/2024 CLINICAL DATA:  Shortness of breath.  Missed dialysis. EXAM: PORTABLE CHEST 1 VIEW COMPARISON:  None Available. FINDINGS: The cardiac silhouette is enlarged and unchanged in size and appearance. There is mild, stable elevation of the left hemidiaphragm with mild, stable left basilar scarring and/or atelectasis. No pleural effusion or pneumothorax is identified. The visualized skeletal structures are unremarkable. IMPRESSION: Stable cardiomegaly with mild, stable left basilar scarring and/or atelectasis. Electronically Signed   By: Suzen Dials M.D.   On: 04/08/2024 22:45     Procedures   Medications Ordered in the ED  iohexol   (OMNIPAQUE ) 350 MG/ML injection 75 mL (75 mLs Intravenous Contrast Given 04/08/24 2327)  metoprolol  tartrate (LOPRESSOR ) tablet 12.5 mg (12.5 mg Oral Given 04/09/24 0133)    Clinical Course as of 04/09/24 0324  Sun Apr 08, 2024  2339 Patient is in SVT at time my arrival to the bedside, though he is asymptomatic.  Throughout our discussion about his presentation today he persisted with heart rate in the high 150s to 160s.  He spontaneously converted at 2303 on the monitor, repeat EKG at 2303 with sinus rhythm with bigeminy, resolution of SVT.  [RS]  Mon Apr 09, 2024  9941 Per ED RN, patient returned from the bathroom and HR is back in the 160s.  [RS]  0122 Patient converted back to normal HR  of 92 after vagal maneuvers in the ED.  [RS]  0130 After prolonged discussion with the patient and extensive chart review it came to my attention that patient was initially placed on metoprolol  25 mg daily by cardiology for his intermittent SVT.  During his most recent hospitalization, they held his metoprolol  as he was found to have low BP during admission.  He was instructed to continue this medication at time of discharge but was directed in the outpatient setting by his primary care doctor to cut the 25 mg tablet in half and take 12.5 mg twice per day.  States he has not been taking this medication most days as he is anxious about properly cutting the pill with a razor blade at home and is concerned he may accidentally give himself too much 1 dose.  Suspect his noncompliance with his metoprolol  is contributing to his intermittent SVT in the emergency department here.  Will proceed with single dose of 12.5 mg oral metoprolol  and monitor cardiac response. [RS]  0321 Fortunately patient maintains normal sinus rhythm with heart rate in the 70s for the duration of his emergency department stay after home dose of metoprolol . He was ambulated in the ED and maintained HR in the 70s with throughout. Patient was educated  regarding importance of compliance with his home medication to prevent recurrence of his SVT.  Directed to follow-up closely with his cardiologist in the outpatient setting. [RS]    Clinical Course User Index [RS] Bobette Pleasant SAUNDERS, PA-C                                 Medical Decision Making 58 year old male with known bladder cancer presents with frank hematuria, normally anuric with ESRD on HD.  Tachycardic with initial EKG and monitoring showing SVT with heart rate in the 160s.  Pulmonary exam unremarkable, abdominal exam is benign.  Based on 1+ nonpitting edema bilateral lower extremities.  Patient well-appearing, GCS 15.  Spontaneous converted to normal sinus rhythm without intervention.  Cardiac exam at this time with some bigeminy but reassuring. Regarding patient's gross hematuria, ddx includes but is not limited to malignancy, urinary tract infection, coagulopathy, nephrolithiasis, ureterolithiasis, prostatitis, GU trauma.     Amount and/or Complexity of Data Reviewed Labs: ordered.    Details: CBC with hemoglobin of 18.9 stable from his presentation 2 months ago.  CMP with baseline creatinine of 14, elevated anion gap to 19.  INR is normal at 1.1.   Radiology: ordered.    Details: Chest x-ray stable cardiomegaly no obvious vascular congestion.  Risk Prescription drug management.   Regarding patient's hematuria, stable from known bladder malignancy.  Regarding his episodes of SVT believe his SVT today is secondary to his poor compliance with his metoprolol  Learta confusions with changes in dosing at time of discharge recently.  After oral dose of metoprolol  patient's rate was well-controlled, no indication at this time for admission.  Recommend close outpatient follow-up with cardiology and urology as previously scheduled.  Clinical concern for emergent early condition at this time that would warrant further ED workup and patient management is exceedingly low.  Adrin voiced  understanding of his medical evaluation and treatment plan. Each of their questions answered to their expressed satisfaction.  Return precautions were given.  Patient is well-appearing, stable, and was discharged in good condition.  This chart was dictated using voice recognition software, Dragon. Despite the best efforts of this provider to  proofread and correct errors, errors may still occur which can change documentation meaning.      Final diagnoses:  SVT (supraventricular tachycardia) [facility])  Gross hematuria    ED Discharge Orders     None          Bobette Pleasant JONELLE DEVONNA 04/09/24 [id number]    Doretha Folks, MD 04/12/24 2113

## 2024-04-08 NOTE — ED Triage Notes (Signed)
 Pt here from home with c/o missed dialysis on Friday and he is passing some blood from his bladder , pt has known prostates and bladder cancer but they have suspended treatment due to his bleeding

## 2024-04-09 LAB — TYPE AND SCREEN
ABO/RH(D): O POS
Antibody Screen: NEGATIVE

## 2024-04-09 MED ORDER — METOPROLOL TARTRATE 25 MG PO TABS
12.5000 mg | ORAL_TABLET | Freq: Once | ORAL | Status: AC
  Administered 2024-04-09: 12.5 mg via ORAL
  Filled 2024-04-09: qty 1

## 2024-04-09 NOTE — ED Notes (Signed)
 Pt ambulated in hallway with cardiac monitor, HR remained 80-90 bpm. EDP aware.

## 2024-04-09 NOTE — Discharge Instructions (Addendum)
 You were seen in the ER today for your blood in your urine.  Fortunately your blood work was stable.  You need to call your urologist today to schedule a sooner follow-up than September.  Please inform them that you have been having more blood from the penis so that they can appropriately schedule you for follow-up.  While you are in the emergency department you were found to have a rapid heart rate with a heart rhythm called SVT.  Fortunately we were able to resolve this in the ER.  You need to be sure you are taking your medication called metoprolol  twice per day as prescribed.  Please use the provided pill cutter to cut your 25 mg tablets into 12.5 mg doses which you should take twice per day as previously prescribed by your cardiologist.  Please also call your cardiologist for close outpatient follow-up for reevaluation given your new abnormal heart rhythm today.  Return to the ER with any new severe symptoms.  Follow up with dialysis as scheduled for this morning. Your hemoglobin today was 8.9.

## 2024-04-16 NOTE — Progress Notes (Signed)
 Cystoscopy Procedure Note  There were no vitals filed for this visit.  Indications:   Devin Bullock is a 58 year old gentleman has had multiple medical problems.  Most recently he has had high-grade T1 transitional cell carcinoma of the bladder.  He has had intravesical gemcitabine/docetaxel previously with maintenance and developed recurrence.  He was to get intravesical BCG but that has been on hold due to active passage of blood and clots.  He was gently irrigated in our office to clear.  Patient states overall he has been doing well in the interim but does continue with active bleeding.  He denies any further passage of clots..   Denies any fevers, chills, flank pain. Pt is on dialysis and is anuric. He also has history of prostate cancer in which he has undergone radiation therapy as well as ADT.  Patient did receive Lupron injection in our office March 2023.  He states he has had no further injections since this time.  Most recent PSA April 2025 was undetectable.  He comes in for office cystoscopy prior to considering intravesical BCG.       Oncology History Overview Note   Diagnosis:  Non-muscle invasive bladder cancer  Gleason 4+4 = 8 High risk adenocarcinoma of the prostate, Grade Group 4 (Gleason 4+4=8), initial PSA unknown   Urologist: Dr. Crawford Radiation Oncologist: Dr. Rhetta      Malignant neoplasm of overlapping sites of bladder (HCC)   11/27/2020 Initial Diagnosis     Malignant neoplasm of overlapping sites of bladder [facility]) Presented with gross hematuria TURBT [facility] Long): HG T1     07/28/2021 Surgery     TURBT: HG T1, muscle present     09/23/2021 - 11/04/2021 Chemotherapy     Induction of intravesical gemcitabine docetaxel completed     12/23/2021 -  Chemotherapy     Maintenance monthly intravesical therapy     Malignant neoplasm of prostate (HCC)   11/28/2020 Initial Diagnosis     Malignant neoplasm of prostate (HCC) TRUS Bx: 4+4=8 3/12 cores     10/21/2021 -   Hormone Therapy     Hormonal Therapy: Lupron 45mg  administered      12/21/2022 - 12/21/2022 Supportive Treatment     LCI Leuprolide Acetate (LUPRON DEPOT, ELIGARD) Q3/4/6 Months - Prostate Cancer Plan Provider: Leveda Dale Rhetta, MD     Bladder cancer [facility]) (Resolved)   11/27/2020 Biopsy     Outside biopsy, [facility] -Prostate, right base lateral: Prostatic adenocarcinoma, Gleason score 4+4 = 8, involving 93% of 1 core, PNI + -Prostate, right base medial: Prostatic adenocarcinoma, Gleason score 4+3 = 7, involving 33% of 1 core -Prostate right mid lateral: Prostatic adenocarcinoma, Gleason score 4+3 = 7, 70% of 1 core   Remaining prostate biopsies negative.   Bladder, transurethral resection of bladder tumor: Invasive high-grade papillary urothelial carcinoma.  Carcinoma focally invades lamina propria, muscularis propria is present and not involved by carcinoma.     12/16/2020 Imaging     Nuclear medicine bone study: No radiotracer activity suspicious for osseous metastatic disease on nuclear scintigraphy whole-body scan.     Tumor marker pattern over time has been the following: No results found for: CA199, CEA, CHROMGRNA, AFPTM, CA125, CA2729, CA153, HCG  Medical History[1]  Allergies[2]  Current Medications[3]  Family History[4]  Social History[5]  Procedure Details  The risks, benefits, complications, treatment options, and expected outcomes were discussed with the patient. The patient concurred with the proposed plan, giving informed consent.  Cystoscopy was  performed today under local anesthesia, using sterile technique. The patient was placed in the supine position, prepped with Betadinex3, and draped in the usual sterile fashion. A  flexible cystoscope was used to inspect both the urethra and bladder.  Findings: Anterior urethra: normal without strictures and without scarring.  Prostatic urethra: 1+ mild prostatic hyperplasia, without  bladder neck contracture. Bladder: Mild trabeculation, with lesions.  On the left anterior bladder wall there was a papillary tumor approximately 2 cm.  It was a bit difficult to visualize the bladder but I saw no other obvious lesions.                    Complications:  None; patient tolerated the procedure well.         Disposition: To home after 30 minute observation.         Condition: stable  Attending Attestation: I performed the procedure.  Diagnostic Cystoscopy  Performed by: Tanda Jama Nicholaus DOUGLAS, MD Authorized by: Tanda Jama Nicholaus DOUGLAS, MD   Procedure Details    Cystoscope type: flexible   Cystoscopy route: transurethral     Cystoscopy location: native bladder     Irrigation used: sterile water      Position: supine  Urethra    Urethra: normal    Prostate    Prostate: normal    Bladder    Bladder: abnormal     Abnormal bladder findings: tumors    Post-Procedure Details    Outcome: patient tolerated procedure well with no complications     Disposition: discharged home in satisfactory condition        Risk Assessment:  He certainly needs a TUR bladder tumor and is to get dialysis today.  I will see if Dr. Vannie can possibly do him in the near future.  This was discussed with [name].  Electronically signed by: Tanda LITTIE Nicholaus DOUGLAS, MD 04/16/2024 10:12 AM        [1] Past Medical History: Diagnosis Date  . Acute GI bleeding 03/16/2023  . Anaphylactic shock, unspecified, sequela 05/25/2019  . Anemia   . Bladder cancer    (CMD)   . CHF (congestive heart failure)    (CMD)   . Dialysis patient   . Hyperkalemia 03/16/2023  . Hypertensive emergency 04/13/2013  . Kidney disease    ESRD  . Lower GI bleed 03/16/2023  . Prostate cancer    (CMD)   . Upper GI bleed 01/14/2024  [2] No Known Allergies [3]  Current Outpatient Medications:  .  atorvastatin  (LIPITOR) 40 mg tablet, Take 1 tablet by mouth., Disp: , Rfl:  .  cinacalcet  (SENSIPAR ) 60 mg tablet,  Take 60 mg by mouth daily., Disp: , Rfl:  .  Colace 100 mg capsule, Take  by mouth., Disp: , Rfl:  .  ergocalciferol  (VITAMIN D2) 1,250 mcg (50,000 unit) capsule, Take by mouth., Disp: , Rfl:  .  iron sucrose 50 mg iron/2.5 mL soln, Infuse 50 mg into a venous catheter. Received at dialysis center on 12/23/2023, Disp: , Rfl:  .  methoxy peg-epoetin  beta (MIRCERA INJ), Infuse 150 mcg into a venous catheter. Last received at dialysis center on 12/19/2023, Disp: , Rfl:  .  metoprolol  tartrate (LOPRESSOR ) 25 mg tablet, Take by mouth., Disp: , Rfl:  .  midodrine  (PROAMATINE ) 5 mg tablet, Take 1 tablet by mouth. Takes prior to dialysis treatments on Mondays, Wednesdays, Fridays, Disp: , Rfl:  .  sevelamer  (RENAGEL ) 800 mg tablet, , Disp: , Rfl:  .  sorbitoL  70 % solution, TAKE 1 TO 2 TABLESPOONSFUL EVERY 8 HOURS AS NEEDED FOR CONSTIPATION, Disp: , Rfl:  .  trospium (SANCTURA) 20 mg tablet, Take 1 tablet (20 mg total) by mouth See Admin Instructions. Pt to take 1 tablet approx 1 hour prior to treatment. Pt is scheduled for multiple treatments, Disp: 20 tablet, Rfl: 0  Current Facility-Administered Medications:  .  bcg vaccine 50 mg in sodium chloride  0.9 % 50 mL bladder instillation, 50 mg, intravesical, Q7 Days, Sherran Adele Moats, NP .  DOCEtaxeL (TAXOTERE) 38 mg in sodium chloride  (non-PVC) 0.9 % 51.9 mL chemo bladder irrigation, 38 mg, intravesical, PRN, Celeste Watts Davis, NP .  gemcitabine (GEMZAR) 1,000 mg in sodium chloride  0.9 % 76.3 mL antegrade chemo irrigation bag, 1,000 mg, intravesical, PRN, Sherran Adele Moats, NP [4] Family History Problem Relation Name Age of Onset  . Heart failure Neg Hx    . Hypertension Neg Hx    . Stroke Neg Hx    [5] Social History Socioeconomic History  . Marital status: Single  Tobacco Use  . Smoking status: Never  . Smokeless tobacco: Never  Substance and Sexual Activity  . Alcohol use: No  . Drug use: No    Comment: Drug use: Denies   Social Drivers  of Health   Food Insecurity: Low Risk  (01/26/2024)   Food vital sign   . Within the past 12 months, you worried that your food would run out before you got money to buy more: Never true   . Within the past 12 months, the food you bought just didn't last and you didn't have money to get more: Never true  Recent Concern: Food Insecurity - Food Insecurity Present (01/16/2024)   [facility]   Food vital sign   . Within the past 12 months, you worried that your food would run out before you got money to buy more: Often true   . Within the past 12 months, the food you bought just didn't last and you didn't have money to get more: Often true  Transportation Needs: No Transportation Needs (01/26/2024)   Transportation   . In the past 12 months, has lack of reliable transportation kept you from medical appointments, meetings, work or from getting things needed for daily living? : No  Safety: Low Risk  (01/26/2024)   Safety   . How often does anyone, including family and friends, physically hurt you?: Never   . How often does anyone, including family and friends, insult or talk down to you?: Never   . How often does anyone, including family and friends, threaten you with harm?: Never   . How often does anyone, including family and friends, scream or curse at you?: Never  Living Situation: Low Risk  (01/26/2024)   Living Situation   . What is your living situation today?: I have a steady place to live   . Think about the place you live. Do you have problems with any of the following? Choose all that apply:: None/None on this list

## 2024-04-19 NOTE — Progress Notes (Signed)
 [facility] Urology Cystoscopy Nursing Note  Pre procedure:  The patient verbalized understanding of cystoscopy and reason for procedure.  Patient given antibiotic per standing order, Cipro 500mg .  Patient assisted into lithotomy position with feet supported in stirrups.  The outside urethral area was cleaned with an antiseptic solution and draped.     Post procedure:  Patient tolerated procedure well.  Patient assisted to the bathroom and given toilette for self care.  Patient verbalized understanding that pink tinged urine and burning or mild discomfort when voiding can be expected for the first few voids post procedure.  This can take up to 2 days to resolve.  Patient verbalized understanding to increase fluid intake and to call the office with inability to void, persistent severe flank pain, elevated temperature over 101F, chills, bright red blood or clots in the urine or painful urination.

## 2024-04-25 NOTE — Telephone Encounter (Signed)
 Colton Margrette Finder, MD  [facility] Front Desk Please schedule a phone visit for this patient with me next Friday, 05/04/24, at 8:00 AM. Feel free to overbook if needed.    Spoke with patient phone visit scheduled 05/04/2024@8am  with Dr.Walker

## 2024-05-01 ENCOUNTER — Encounter: Payer: Self-pay | Admitting: Gastroenterology

## 2024-05-01 ENCOUNTER — Ambulatory Visit: Admitting: Gastroenterology

## 2024-05-01 VITALS — BP 100/60 | HR 100 | Ht 76.0 in | Wt 303.0 lb

## 2024-05-01 DIAGNOSIS — D649 Anemia, unspecified: Secondary | ICD-10-CM | POA: Diagnosis not present

## 2024-05-01 DIAGNOSIS — N186 End stage renal disease: Secondary | ICD-10-CM

## 2024-05-01 DIAGNOSIS — Z992 Dependence on renal dialysis: Secondary | ICD-10-CM

## 2024-05-01 DIAGNOSIS — K625 Hemorrhage of anus and rectum: Secondary | ICD-10-CM | POA: Diagnosis not present

## 2024-05-01 DIAGNOSIS — K648 Other hemorrhoids: Secondary | ICD-10-CM

## 2024-05-01 DIAGNOSIS — C678 Malignant neoplasm of overlapping sites of bladder: Secondary | ICD-10-CM

## 2024-05-01 DIAGNOSIS — Z8546 Personal history of malignant neoplasm of prostate: Secondary | ICD-10-CM

## 2024-05-01 MED ORDER — HYDROCORTISONE ACETATE 25 MG RE SUPP: 25.0000 mg | Freq: Every evening | RECTAL | 0 refills | Status: DC

## 2024-05-01 NOTE — Progress Notes (Signed)
 I agree with the assessment and plan as outlined by Ms. May. Since he has never had a colonoscopy before and continues to have intermittent hematochezia, would be reasonable to proceed with a colonoscopy for further evaluation. Would have to be scheduled in the hospital with any provider that has an available opening.

## 2024-05-01 NOTE — Patient Instructions (Addendum)
 Recommend high fiber diet No straining Anusol  suppository at bedtime for 7-10 days  _______________________________________________________  If your blood pressure at your visit was 140/90 or greater, please contact your primary care physician to follow up on this.  _______________________________________________________  If you are age 60 or older, your body mass index should be between 23-30. Your Body mass index is 36.88 kg/m. If this is out of the aforementioned range listed, please consider follow up with your Primary Care Provider.  If you are age 88 or younger, your body mass index should be between 19-25. Your Body mass index is 36.88 kg/m. If this is out of the aformentioned range listed, please consider follow up with your Primary Care Provider.   ________________________________________________________  The [facility] providers would like to encourage you to use MYCHART to communicate with providers for non-urgent requests or questions.  Due to long hold times on the telephone, sending your provider a message by [facility] may be a faster and more efficient way to get a response.  Please allow 48 business hours for a response.  Please remember that this is for non-urgent requests.  _______________________________________________________  [facility] is using a team-based approach to care.  Your team is made up of your doctor and two to three APPS. Our APPS (Nurse Practitioners and Physician Assistants) work with your physician to ensure care continuity for you. They are fully qualified to address your health concerns and develop a treatment plan. They communicate directly with your gastroenterologist to care for you. Seeing the Advanced Practice Practitioners on your physician's team can help you by facilitating care more promptly, often allowing for earlier appointments, access to diagnostic testing, procedures, and other specialty referrals.   Thank you for trusting me  with your gastrointestinal care. Deanna May, FNP-C

## 2024-05-01 NOTE — Progress Notes (Signed)
 Chief Complaint:hospital follow-up, rectal bleeding Primary GI Doctor:Dr. Federico  HPI:  Patient is a  58  year old male patient with past medical history of ESRD on HD,HFrEF, bladder CA on chemotherapy, history of prostate CA, who presents for hospital follow-up on rectal bleeding  and anemia.  01/16/24 patient presented from hemodialysis center for evaluation of hemoglobin of 5.1.  He was found to have right perirenal retroperitoneal hematoma with blood clots in right renal pelvis/proximal ureter.  Patient was transfused with 5 units of packed red blood cells.  Followed by urology.  GI was consulted for hematochezia. Denies bowel movements or rectal bleeding during this hospitalization.     At this juncture, it appears that his anemia and bleeding are related to perinephric hematoma. There does not appear to be an active GI source. He was initially scheduled for colonoscopy in 2 weeks. We discussed delaying his colonoscopy approximately 6 weeks due to his hematoma.   Patient was scheduled for colonoscopy with Dr. Federico on 8/14 however was canceled due to patient being on chemotherapy.   04/19/24 seen by urology. Most recently he has had high-grade T1 transitional cell carcinoma of the bladder. He has had intravesical gemcitabine/docetaxel previously with maintenance and developed recurrence. He was to get intravesical BCG but that has been on hold due to active passage of blood and clots. Patient states overall he has been doing well in the interim but does continue with active bleeding. Patient had cystoscopy prior to considering intravesical BCG. Plan is for Transurethral Resection of Bladder Tumor in near future.  Interval History     Patient presents for hospital follow-up for rectal bleeding patient states he has had intermittently since at least June. He reports he has one bowel movement daily, but reports most of them are loose. He at times feels like he has to pass gas and stool leaks out.   Patient reports he was evaluated a couple of months ago and told he had internal hemorrhoids and prescribed suppositories but unfortunately he did not use them correctly.  He inquires about a new prescription.  He has five treatments of chemotherapy which is on hold until after a procedure scheduled with the urologist. He has follow-up on Friday to discuss how they will proceed.   No prior colonoscopy, family history of colon cancer in 2 aunts   Wt Readings from Last 3 Encounters:  05/01/24 (!) 303 lb (137.4 kg)  03/15/24 300 lb (136.1 kg)  01/18/24 (!) 315 lb 11.2 oz (143.2 kg)    Past Medical History:  Diagnosis Date   Anemia    AV fistula (HCC)    Cancer (HCC)    prostate and bladder cancer - 2022 diagnosed   CHF (congestive heart failure) (HCC)    patient denies   ESRD on hemodialysis (HCC)    MWF [facility]   History of blood transfusion 03/2020   Hypertension    history of, no longer taking medication at this time   Morbid obesity (HCC)    Parathyroid disorder [facility])     Past Surgical History:  Procedure Laterality Date   AV FISTULA PLACEMENT Left 05/24/2014   Procedure: BRACHIOCEPHALIC ARTERIOVENOUS (AV) FISTULA CREATION;  Surgeon: Lonni GORMAN Blade, MD;  [facility];  Service: Vascular;  Laterality: Left;   AV FISTULA PLACEMENT Left 09/15/2023   Procedure: LEFT ARM ARTERIOVENOUS (AV) FISTULA REVISION AND PLICATION;  Surgeon: Gretta Lonni PARAS, MD;  Location: [facility] OR;  Service: Vascular;  Laterality: Left;   ESOPHAGOGASTRODUODENOSCOPY (  EGD) WITH PROPOFOL  N/A 04/03/2020   Procedure: ESOPHAGOGASTRODUODENOSCOPY (EGD) WITH PROPOFOL ;  Surgeon: Saintclair Jasper, MD;  Location: [facility] ENDOSCOPY;  Service: Gastroenterology;  Laterality: N/A;   PROSTATE BIOPSY N/A 11/27/2020   Procedure: BIOPSY TRANSRECTAL ULTRASONIC PROSTATE (TUBP);  Surgeon: Carolee Sherwood JONETTA DOUGLAS, MD;  Location: [facility] ORS;  Service: Urology;  Laterality: N/A;  REQUESTING 48 MINS FOR CASE   REVISON OF ARTERIOVENOUS FISTULA  Left 01/08/2021   Procedure: REVISON PLICATION OF ARTERIOVENOUS FISTULA LEFT;  Surgeon: Sheree Penne Bruckner, MD;  Location: [facility] OR;  Service: Vascular;  Laterality: Left;   SURGICAL EXCISION OF EXCESSIVE SKIN Left 09/15/2023   Procedure: SURGICAL EXCISION OF EXCESSIVE SKIN, ULCERATIVE SKIN;  Surgeon: Gretta Bruckner PARAS, MD;  Location: [facility] OR;  Service: Vascular;  Laterality: Left;   THROMBECTOMY W/ EMBOLECTOMY Left 09/15/2023   Procedure: THROMBECTOMY ARTERIOVENOUS FISTULA;  Surgeon: Gretta Bruckner PARAS, MD;  Location: [facility] OR;  Service: Vascular;  Laterality: Left;   TRANSURETHRAL RESECTION OF BLADDER TUMOR N/A 11/27/2020   Procedure: TRANSURETHRAL RESECTION OF BLADDER TUMOR (TURBT);  Surgeon: Carolee Sherwood JONETTA DOUGLAS, MD;  Location: [facility] ORS;  Service: Urology;  Laterality: N/A;  NEED PARALYSIS    Current Outpatient Medications  Medication Sig Dispense Refill   atorvastatin  (LIPITOR) 40 MG tablet Take 1 tablet (40 mg total) by mouth daily. 30 tablet 2   B Complex-C-Folic Acid (B-COMPLEX/FOLIC ACID/VITAMIN C) TBCR Take 1 tablet by mouth daily.     B Complex-C-Folic Acid (RENA-VITE RX) 1 MG TABS Take 1 tablet by mouth daily.     cinacalcet  (SENSIPAR ) 60 MG tablet Take 60 mg by mouth daily.     guaiFENesin  (MUCINEX ) 600 MG 12 hr tablet Take 1 tablet (600 mg total) by mouth 2 (two) times daily as needed for cough or to loosen phlegm. 20 tablet 1   guaifenesin  (ROBITUSSIN) 100 MG/5ML syrup Take 200 mg by mouth 3 (three) times daily as needed for cough.     hydrocortisone  (ANUSOL -HC) 25 MG suppository Place 1 suppository (25 mg total) rectally at bedtime. 10 suppository 0   Methoxy PEG-Epoetin  Beta (MIRCERA IJ) Mircera     midodrine  (PROAMATINE ) 5 MG tablet Take 5 mg by mouth. Taking Monday, Wednesday and Friday at dialysis     sevelamer  (RENAGEL ) 800 MG tablet      sevelamer  carbonate (RENVELA ) 800 MG tablet Take 2,400-3,200 mg by mouth 2 (two) times daily.     [Paused] terbinafine  (LAMISIL ) 250 MG  tablet TAKE 1 TABLET BY MOUTH EVERY DAY 30 tablet 0   trospium (SANCTURA) 20 MG tablet Take 20 mg by mouth.     Vitamin D , Ergocalciferol , (DRISDOL) 1.25 MG (50000 UNIT) CAPS capsule Take by mouth.     No current facility-administered medications for this visit.    Allergies as of 05/01/2024   (No Known Allergies)    Family History  Problem Relation Age of Onset   Diabetes Mother    Hypertension Mother    Heart attack Mother 70   Stroke Father    Hypertension Father    Depression Father    Diabetes Brother    Hypertension Brother    Stroke Brother 93   Alcohol abuse Brother    Asthma Brother    Colon cancer Neg Hx    Esophageal cancer Neg Hx    Stomach cancer Neg Hx     Review of Systems:    Constitutional: No weight loss, fever, chills, weakness or fatigue HEENT: Eyes: No change in vision  Ears, Nose, Throat:  No change in hearing or congestion Skin: No rash or itching Cardiovascular: No chest pain, chest pressure or palpitations   Respiratory: No SOB or cough Gastrointestinal: See HPI and otherwise negative Genitourinary: No dysuria or change in urinary frequency Neurological: No headache, dizziness or syncope Musculoskeletal: No new muscle or joint pain Hematologic: No bleeding or bruising Psychiatric: No history of depression or anxiety    Physical Exam:  Vital signs: BP 100/60   Pulse 100   Ht 6' 4 (1.93 m)   Wt (!) 303 lb (137.4 kg)   BMI 36.88 kg/m   Constitutional:   Pleasant  A.A. male appears to be in NAD, Well developed, Well nourished, alert and cooperative Throat: Oral cavity and pharynx without inflammation, swelling or lesion.  Respiratory: Respirations even and unlabored. Lungs clear to auscultation bilaterally.   No wheezes, crackles, or rhonchi.  Cardiovascular: Normal S1, S2. Regular rate and rhythm. No peripheral edema, cyanosis or pallor.  Gastrointestinal:  Soft, nondistended, nontender. No rebound or guarding. Normal bowel  sounds. No appreciable masses or hepatomegaly. Rectal: Normal external rectal exam, normal rectal tone, appreciated internal hemorrhoids, non-tender, no masses, , brown stool, hemoccult, Chaperone DeniseN/A  Anoscopy:internal hemorrhoids noted Msk:  Symmetrical without gross deformities. Without edema, no deformity or joint abnormality.  Neurologic:  Alert and  oriented x4;  grossly normal neurologically.  Skin:   Dry and intact without significant lesions or rashes.  RELEVANT LABS AND IMAGING: CBC    Latest Ref Rng & Units 04/08/2024   10:39 PM 04/08/2024   10:20 PM 01/20/2024    6:19 AM  CBC  WBC 4.0 - 10.5 K/uL  6.6  5.8   Hemoglobin 13.0 - 17.0 g/dL 9.2  8.9  8.9   Hematocrit 39.0 - 52.0 % 27.0  28.8  28.0   Platelets 150 - 400 K/uL  188  316      CMP     Latest Ref Rng & Units 04/08/2024   10:39 PM 04/08/2024   10:20 PM 01/19/2024    4:47 AM  CMP  Glucose 70 - 99 mg/dL 882  878  884   BUN 6 - 20 mg/dL 61  65  23   Creatinine 0.61 - 1.24 mg/dL 84.99  85.02  3.86   Sodium 135 - 145 mmol/L 140  139  135   Potassium 3.5 - 5.1 mmol/L 4.1  4.1  3.6   Chloride 98 - 111 mmol/L 97  94  98   CO2 22 - 32 mmol/L  26  29   Calcium  8.9 - 10.3 mg/dL  9.0  8.7   Total Protein 6.5 - 8.1 g/dL  6.9    Total Bilirubin 0.0 - 1.2 mg/dL  0.4    Alkaline Phos 38 - 126 U/L  115    AST 15 - 41 U/L  25    ALT 0 - 44 U/L  12       Lab Results  Component Value Date   TSH 0.911 05/21/2023   04/08/24 CTAP IMPRESSION: 1. Stable, marked severity bilateral hydronephrosis, right greater than left, with marked severity right-sided hydroureter. 2. Renal atrophy with numerous stable bilateral simple renal cysts. 3. Colonic diverticulosis. 4. Stable, moderate severity left basilar scarring and atelectasis. 5. Aortic atherosclerosis.  05/2023 echo- Left ventricular ejection fraction, by estimation, is 50 to 55%.   Assessment/Plan: Encounter Diagnoses  Name Primary?   Rectal bleeding Yes   Internal  hemorrhoids    ESRD on  dialysis (HCC)    Anemia, unspecified type    History of prostate cancer    Malignant neoplasm of overlapping sites of bladder [facility])   #73 58 year old African-American male with end-stage renal disease on hemodialysis, recent diagnosis of a bladder tumor for which he was scheduled for outpatient TUR-admitted after labs at dialysis showed profound anemia with hemoglobin 5.1. Received 5 units PRBCs. Hgb 9.2. stable.   #2 chronic low-grade hematochezia-had been scheduled for colonoscopy to be done in June but delayed due to large right perinephric hematoma  and then in August, cancelled due to chemotherapy. Patient has not started chemotherapy yet, pending urology procedure. Did note internal hemorrhoids on physical exam today will prescribe Anusol  suppositories. Defer procedures to Dr. Federico, will have to be done in hospital. -Anusol  suppositories at bedtime -high fiber diet -defer procedures to Dr. Federico   Thank you for the courtesy of this consult. Please call me with any questions or concerns.   Ayssa Bentivegna, FNP-C [facility] 05/01/2024, 4:52 PM  Cc: Lonna Agent, MD

## 2024-05-08 ENCOUNTER — Telehealth: Payer: Self-pay | Admitting: Gastroenterology

## 2024-05-08 ENCOUNTER — Telehealth: Payer: Self-pay

## 2024-05-08 NOTE — Telephone Encounter (Signed)
 Inbound call from RN care manager courtney calling in regards to hydrocortisone  suppository, states prescription is not covered by patient insurance and if possible if patient can be prescribed something similar. Can be further advised at [phone].   Thank you

## 2024-05-08 NOTE — Telephone Encounter (Signed)
 May, Deanna J, NP  P Lbgi Pod B Triage Pod B- Im unable to contact the urologist Tanda Jama Nicholaus DOUGLAS, MD through epic. Can someone call his office and find out if they have any issues with him doing a colonoscopy with our office. He is pending chemotherapy.  Deanna, NP

## 2024-05-08 NOTE — Telephone Encounter (Signed)
 Fax sent to Dr. Sallee office requesting clearance.

## 2024-05-09 ENCOUNTER — Other Ambulatory Visit: Payer: Self-pay | Admitting: Gastroenterology

## 2024-05-09 DIAGNOSIS — K648 Other hemorrhoids: Secondary | ICD-10-CM

## 2024-05-09 MED ORDER — HYDROCORTISONE (PERIANAL) 2.5 % EX CREA: 1.0000 | TOPICAL_CREAM | Freq: Two times a day (BID) | CUTANEOUS | 1 refills | Status: DC

## 2024-05-09 NOTE — Progress Notes (Signed)
 Anusol  topical sent, suppository not covered

## 2024-05-13 ENCOUNTER — Emergency Department (HOSPITAL_COMMUNITY)

## 2024-05-13 ENCOUNTER — Inpatient Hospital Stay (HOSPITAL_COMMUNITY)
Admission: EM | Admit: 2024-05-13 | Discharge: 2024-05-25 | DRG: 695 | Disposition: A | Discharge status: Home / Self Care | Attending: Internal Medicine | Admitting: Internal Medicine

## 2024-05-13 ENCOUNTER — Other Ambulatory Visit: Payer: Self-pay

## 2024-05-13 DIAGNOSIS — I5033 Acute on chronic diastolic (congestive) heart failure: Secondary | ICD-10-CM | POA: Diagnosis present

## 2024-05-13 DIAGNOSIS — R319 Hematuria, unspecified: Secondary | ICD-10-CM | POA: Diagnosis present

## 2024-05-13 DIAGNOSIS — D631 Anemia in chronic kidney disease: Secondary | ICD-10-CM | POA: Diagnosis present

## 2024-05-13 DIAGNOSIS — I959 Hypotension, unspecified: Secondary | ICD-10-CM | POA: Diagnosis present

## 2024-05-13 DIAGNOSIS — Z992 Dependence on renal dialysis: Secondary | ICD-10-CM

## 2024-05-13 DIAGNOSIS — Z905 Acquired absence of kidney: Secondary | ICD-10-CM

## 2024-05-13 DIAGNOSIS — D638 Anemia in other chronic diseases classified elsewhere: Secondary | ICD-10-CM | POA: Diagnosis present

## 2024-05-13 DIAGNOSIS — Z923 Personal history of irradiation: Secondary | ICD-10-CM

## 2024-05-13 DIAGNOSIS — E66812 Obesity, class 2: Secondary | ICD-10-CM | POA: Diagnosis present

## 2024-05-13 DIAGNOSIS — Z8551 Personal history of malignant neoplasm of bladder: Secondary | ICD-10-CM

## 2024-05-13 DIAGNOSIS — N132 Hydronephrosis with renal and ureteral calculous obstruction: Secondary | ICD-10-CM | POA: Diagnosis present

## 2024-05-13 DIAGNOSIS — Y842 Radiological procedure and radiotherapy as the cause of abnormal reaction of the patient, or of later complication, without mention of misadventure at the time of the procedure: Secondary | ICD-10-CM | POA: Diagnosis present

## 2024-05-13 DIAGNOSIS — R310 Gross hematuria: Principal | ICD-10-CM | POA: Diagnosis present

## 2024-05-13 DIAGNOSIS — K648 Other hemorrhoids: Secondary | ICD-10-CM

## 2024-05-13 DIAGNOSIS — I5020 Unspecified systolic (congestive) heart failure: Secondary | ICD-10-CM | POA: Diagnosis present

## 2024-05-13 DIAGNOSIS — D649 Anemia, unspecified: Secondary | ICD-10-CM

## 2024-05-13 DIAGNOSIS — D62 Acute posthemorrhagic anemia: Secondary | ICD-10-CM | POA: Diagnosis present

## 2024-05-13 DIAGNOSIS — N368 Other specified disorders of urethra: Secondary | ICD-10-CM | POA: Diagnosis present

## 2024-05-13 DIAGNOSIS — N3041 Irradiation cystitis with hematuria: Principal | ICD-10-CM | POA: Diagnosis present

## 2024-05-13 DIAGNOSIS — N2581 Secondary hyperparathyroidism of renal origin: Secondary | ICD-10-CM | POA: Diagnosis present

## 2024-05-13 DIAGNOSIS — Z79899 Other long term (current) drug therapy: Secondary | ICD-10-CM

## 2024-05-13 DIAGNOSIS — Z823 Family history of stroke: Secondary | ICD-10-CM

## 2024-05-13 DIAGNOSIS — Z6838 Body mass index (BMI) 38.0-38.9, adult: Secondary | ICD-10-CM

## 2024-05-13 DIAGNOSIS — N1330 Unspecified hydronephrosis: Secondary | ICD-10-CM

## 2024-05-13 DIAGNOSIS — I10 Essential (primary) hypertension: Secondary | ICD-10-CM | POA: Diagnosis present

## 2024-05-13 DIAGNOSIS — E785 Hyperlipidemia, unspecified: Secondary | ICD-10-CM | POA: Diagnosis present

## 2024-05-13 DIAGNOSIS — N186 End stage renal disease: Secondary | ICD-10-CM | POA: Diagnosis present

## 2024-05-13 DIAGNOSIS — K649 Unspecified hemorrhoids: Secondary | ICD-10-CM | POA: Diagnosis present

## 2024-05-13 DIAGNOSIS — Z818 Family history of other mental and behavioral disorders: Secondary | ICD-10-CM

## 2024-05-13 DIAGNOSIS — I700 Atherosclerosis of aorta: Secondary | ICD-10-CM | POA: Diagnosis present

## 2024-05-13 DIAGNOSIS — Z833 Family history of diabetes mellitus: Secondary | ICD-10-CM

## 2024-05-13 DIAGNOSIS — Z825 Family history of asthma and other chronic lower respiratory diseases: Secondary | ICD-10-CM

## 2024-05-13 DIAGNOSIS — I132 Hypertensive heart and chronic kidney disease with heart failure and with stage 5 chronic kidney disease, or end stage renal disease: Secondary | ICD-10-CM | POA: Diagnosis present

## 2024-05-13 DIAGNOSIS — C679 Malignant neoplasm of bladder, unspecified: Secondary | ICD-10-CM | POA: Diagnosis present

## 2024-05-13 DIAGNOSIS — I471 Supraventricular tachycardia, unspecified: Secondary | ICD-10-CM | POA: Diagnosis present

## 2024-05-13 DIAGNOSIS — E1122 Type 2 diabetes mellitus with diabetic chronic kidney disease: Secondary | ICD-10-CM | POA: Diagnosis present

## 2024-05-13 DIAGNOSIS — Z8546 Personal history of malignant neoplasm of prostate: Secondary | ICD-10-CM

## 2024-05-13 DIAGNOSIS — Z811 Family history of alcohol abuse and dependence: Secondary | ICD-10-CM

## 2024-05-13 DIAGNOSIS — Z8249 Family history of ischemic heart disease and other diseases of the circulatory system: Secondary | ICD-10-CM

## 2024-05-13 LAB — I-STAT CHEM 8, ED
BUN: 40 mg/dL — ABNORMAL HIGH (ref 6–20)
Calcium, Ion: 1.05 mmol/L — ABNORMAL LOW (ref 1.15–1.40)
Chloride: 97 mmol/L — ABNORMAL LOW (ref 98–111)
Creatinine, Ser: 11.5 mg/dL — ABNORMAL HIGH (ref 0.61–1.24)
Glucose, Bld: 111 mg/dL — ABNORMAL HIGH (ref 70–99)
HCT: 24 % — ABNORMAL LOW (ref 39.0–52.0)
Hemoglobin: 8.2 g/dL — ABNORMAL LOW (ref 13.0–17.0)
Potassium: 3.6 mmol/L (ref 3.5–5.1)
Sodium: 138 mmol/L (ref 135–145)
TCO2: 29 mmol/L (ref 22–32)

## 2024-05-13 LAB — CBC
HCT: 24.3 % — ABNORMAL LOW (ref 39.0–52.0)
Hemoglobin: 7.7 g/dL — ABNORMAL LOW (ref 13.0–17.0)
MCH: 32.2 pg (ref 26.0–34.0)
MCHC: 31.7 g/dL (ref 30.0–36.0)
MCV: 101.7 fL — ABNORMAL HIGH (ref 80.0–100.0)
Platelets: 198 K/uL (ref 150–400)
RBC: 2.39 MIL/uL — ABNORMAL LOW (ref 4.22–5.81)
RDW: 18 % — ABNORMAL HIGH (ref 11.5–15.5)
WBC: 6.5 K/uL (ref 4.0–10.5)
nRBC: 0.6 % — ABNORMAL HIGH (ref 0.0–0.2)

## 2024-05-13 LAB — COMPREHENSIVE METABOLIC PANEL WITH GFR
ALT: 15 U/L (ref 0–44)
AST: 28 U/L (ref 15–41)
Albumin: 3.9 g/dL (ref 3.5–5.0)
Alkaline Phosphatase: 125 U/L (ref 38–126)
Anion gap: 20 — ABNORMAL HIGH (ref 5–15)
BUN: 42 mg/dL — ABNORMAL HIGH (ref 6–20)
CO2: 26 mmol/L (ref 22–32)
Calcium: 9.8 mg/dL (ref 8.9–10.3)
Chloride: 93 mmol/L — ABNORMAL LOW (ref 98–111)
Creatinine, Ser: 10.52 mg/dL — ABNORMAL HIGH (ref 0.61–1.24)
GFR, Estimated: 5 mL/min — ABNORMAL LOW (ref >60)
Glucose, Bld: 110 mg/dL — ABNORMAL HIGH (ref 70–99)
Potassium: 3.7 mmol/L (ref 3.5–5.1)
Sodium: 139 mmol/L (ref 135–145)
Total Bilirubin: 0.6 mg/dL (ref 0.0–1.2)
Total Protein: 7.2 g/dL (ref 6.5–8.1)

## 2024-05-13 LAB — TROPONIN I (HIGH SENSITIVITY)
Troponin I (High Sensitivity): 47 ng/L — ABNORMAL HIGH (ref <18)
Troponin I (High Sensitivity): 52 ng/L — ABNORMAL HIGH (ref <18)

## 2024-05-13 LAB — APTT: aPTT: 29 s (ref 24–36)

## 2024-05-13 LAB — I-STAT CG4 LACTIC ACID, ED: Lactic Acid, Venous: 2.3 mmol/L (ref 0.5–1.9)

## 2024-05-13 LAB — PROTIME-INR
INR: 1.2 (ref 0.8–1.2)
Prothrombin Time: 16 s — ABNORMAL HIGH (ref 11.4–15.2)

## 2024-05-13 LAB — BRAIN NATRIURETIC PEPTIDE: B Natriuretic Peptide: 62.5 pg/mL (ref 0.0–100.0)

## 2024-05-13 MED ORDER — MIDODRINE HCL 5 MG PO TABS: 5.0000 mg | ORAL_TABLET | ORAL | Status: DC

## 2024-05-13 MED ORDER — ONDANSETRON HCL 4 MG/2ML IJ SOLN
4.0000 mg | Freq: Four times a day (QID) | INTRAMUSCULAR | Status: DC | PRN
  Administered 2024-05-14: 4 mg via INTRAVENOUS
  Filled 2024-05-13: qty 2

## 2024-05-13 MED ORDER — ACETAMINOPHEN 325 MG PO TABS: 650.0000 mg | ORAL_TABLET | Freq: Four times a day (QID) | ORAL | Status: DC | PRN

## 2024-05-13 MED ORDER — ATORVASTATIN CALCIUM 40 MG PO TABS
40.0000 mg | ORAL_TABLET | Freq: Every day | ORAL | Status: DC
  Administered 2024-05-14 – 2024-05-25 (×12): 40 mg via ORAL
  Filled 2024-05-13 (×12): qty 1

## 2024-05-13 MED ORDER — SEVELAMER CARBONATE 800 MG PO TABS
2400.0000 mg | ORAL_TABLET | Freq: Three times a day (TID) | ORAL | Status: DC
  Administered 2024-05-14 – 2024-05-25 (×24): 2400 mg via ORAL
  Filled 2024-05-13 (×28): qty 3

## 2024-05-13 MED ORDER — ONDANSETRON HCL 4 MG PO TABS: 4.0000 mg | ORAL_TABLET | Freq: Four times a day (QID) | ORAL | Status: DC | PRN

## 2024-05-13 MED ORDER — HYDROMORPHONE HCL 1 MG/ML IJ SOLN
0.5000 mg | INTRAMUSCULAR | Status: DC | PRN
  Administered 2024-05-14: 0.5 mg via INTRAVENOUS
  Filled 2024-05-13 (×2): qty 1

## 2024-05-13 MED ORDER — IOHEXOL 350 MG/ML SOLN
100.0000 mL | Freq: Once | INTRAVENOUS | Status: AC | PRN
  Administered 2024-05-13: 100 mL via INTRAVENOUS

## 2024-05-13 MED ORDER — SENNOSIDES-DOCUSATE SODIUM 8.6-50 MG PO TABS
1.0000 | ORAL_TABLET | Freq: Every evening | ORAL | Status: DC | PRN
  Administered 2024-05-19 – 2024-05-20 (×2): 1 via ORAL
  Filled 2024-05-13 (×2): qty 1

## 2024-05-13 MED ORDER — HYDROCODONE-ACETAMINOPHEN 5-325 MG PO TABS
1.0000 | ORAL_TABLET | ORAL | Status: DC | PRN
  Filled 2024-05-13: qty 2
  Filled 2024-05-13: qty 1

## 2024-05-13 MED ORDER — CINACALCET HCL 30 MG PO TABS
60.0000 mg | ORAL_TABLET | Freq: Every day | ORAL | Status: DC
Start: 2024-05-14 — End: 2024-05-25
  Administered 2024-05-15 – 2024-05-24 (×8): 60 mg via ORAL
  Filled 2024-05-13 (×12): qty 2

## 2024-05-13 MED ORDER — FENTANYL CITRATE PF 50 MCG/ML IJ SOSY
25.0000 ug | PREFILLED_SYRINGE | Freq: Once | INTRAMUSCULAR | Status: AC
  Administered 2024-05-13: 25 ug via INTRAVENOUS
  Filled 2024-05-13: qty 1

## 2024-05-13 MED ORDER — METOPROLOL TARTRATE 12.5 MG HALF TABLET
12.5000 mg | ORAL_TABLET | Freq: Two times a day (BID) | ORAL | Status: DC
  Administered 2024-05-14 – 2024-05-25 (×15): 12.5 mg via ORAL
  Filled 2024-05-13 (×21): qty 1

## 2024-05-13 MED ORDER — ACETAMINOPHEN 650 MG RE SUPP: 650.0000 mg | Freq: Four times a day (QID) | RECTAL | Status: DC | PRN

## 2024-05-13 NOTE — Hospital Course (Addendum)
 Brief Narrative:    58 y.o. [place]ock with medical history significant for ESRD on MWF HD, prostate and bladder cancer s/p TURBT 2022, anemia of CKD, paroxysmal SVT, history of spontaneous right perinephric hematoma (June 2025) who presented to the ED for evaluation of hematuria with large blood clots for 2 days associated with suprapubic and urethral pain.  Previously admitted in June for acute blood loss anemia from spontaneous right perinephric bleed requiring 4 units PRBC transfusion follows with urology and nephrology at [facility] in [place].  Underwent cystoscopy 9/4 which showed bladder tumor and is scheduled for TURBT on 06/21/2024. CT abdomen pelvis with contrast shows bilateral severe hydroureteronephrosis unchanged from prior exam, chronic bladder wall thickening.  Seen by urology underwent cystoscopy with no definitive source.  During this hospitalization has required PRBC transfusion.  Declining transfer to [facility] for discharge home.  Patient is tolerating HD well today.  He has been advised that he will need to follow-up with [facility] at this time.  Currently no acute medical intervention planned by urology here and no indication to keep him hospitalized.  Several discussions with him regarding importance of keeping his appointment with [facility] urology for his TURBT planned on June 21, 2024.  Assessment & Plan:  Gross hematuria, resolved. Acute blood loss anemia history of prostate/bladder cancer and spontaneous right perinephric hematoma in June 2025/gross hematuria: CTA and cystoscopy unable to locate the source of bleeding, concern of ureteral source based on clot morphology and a clear bladder on cystoscopy.  There is a high risk for bladder cancer therefore he has TURBT planned with [facility] on November 6025.  He needs to keep this appointment.  Prior to that he will see outpatient primary care physician and nephrolog -She did receive 3 units of PRBC transfusion on 9/29,  10/1 and 10/5.  Hemoglobin now remains at Devin.9 without any obvious evidence of bleeding.  -Have discussed this with [facility], Ole Bras, multiple times during this hospitalization. And I appreciate their input.  There is no acute indication for intervention at this time and he would have to follow-up with his outpatient urology at [facility].    Rectal bleeding; resolved.   some blood in stool and wipes. Pt reports hemorrhoidal bleed in the past.  Hemoglobin stable around 8.0   Hyperlipidemia  Continue atorvastatin .   History of SVT  Patient had 1 episode of SVT in the ED which was treated with his oral metoprolol .  Currently tolerating metoprolol  12.5 twice daily.   Heart failure with preserved ejection fraction 55%  Appears stable.  Continue Lopressor .   Hypotension  Patient on midodrine .   ESRD on HD  Monday Wednesday Friday schedule.  Management per nephrology.   Class II obesity  BMI almost 38.  Weight loss and diet modification counseled.   DVT prophylaxis: SCDs Start: 05/13/24 2350    Code Status: Full Code Family Communication:   Status is: Inpatient Remains inpatient appropriate because: Discharge today   PT Follow up Recs:   Subjective: Seen during HD.  No complaints at this time. I been very clear to him that at this time urology team has nothing to offer here and for definitive treatment he will follow-up with [facility] as previously planned TURBT  Examination:  General exam: Appears calm and comfortable  Respiratory system: Clear to auscultation. Respiratory effort normal. Cardiovascular system: S1 & S2 heard, RRR. No JVD, murmurs, rubs, gallops or clicks. No pedal edema. Gastrointestinal system: Abdomen is nondistended, soft and nontender. No  organomegaly or masses felt. Normal bowel sounds heard. Central nervous system: Alert and oriented. No focal neurological deficits. Extremities: Symmetric 5 x 5 power. Skin: No rashes, lesions or  ulcers Psychiatry: Judgement and insight appear normal. Mood & affect appropriate.

## 2024-05-13 NOTE — H&P (Signed)
 History and Physical    Devin Bullock FMW:[id number] DOB: Apr 19, 1966 DOA: 05/13/2024  PCP: [facility]  Patient coming from: Home  I have personally briefly reviewed patient's old medical records in [facility]  Chief Complaint: Gross hematuria/passing large blood clots  HPI: Devin Bullock is a 58 y.o. male with medical history significant for ESRD on MWF HD, prostate and bladder cancer s/p TURBT 2022, anemia of CKD, paroxysmal SVT, history of spontaneous right perinephric hematoma (June 2025) who presented to the ED for evaluation of hematuria with large blood clots.  Patient reports 2 days ago that he started having suprapubic and urethral pain.  He started passing along dark red blood clots.  He also was having right flank pain.  He says normally he does not make any urine.  He showed me a video showing a large amount of long stringy blood clots that he has seen coming out over the last 2 days.  He does not take any blood thinners.  Patient was previously admitted in June for acute blood loss anemia due to spontaneous right perinephric requiring 4 unit PRBC transfusions.  He was monitored closely and did not require any other specific intervention.  Patient's primary nephrology and neurology teams are with [facility] in [place].  He was previously receiving BCG treatments which were held due to ongoing urethral bleeding.  He underwent cystoscopy 04/19/2024 which showed a tumor in the bladder.  He is scheduled for TURBT on 06/21/2024.  ED Course  Labs/Imaging on admission: I have personally reviewed following labs and imaging studies.  Initial vitals showed BP 114/83, pulse 158, RR 24, temp 98.1 F, SpO2 98% on room air.  Initial heart rhythm was SVT and since converted to sinus rhythm rate 80s-90s.  Labs showed WBC 6.5, hemoglobin 7.7, platelets 198, sodium 139, potassium 3.7, bicarb 26, BUN 42, creatinine 10.52, serum glucose 110, LFTs within normal limits,  BNP 62.5, troponin 47 > 52, lactic acid 2.3.  CT abdomen/pelvis with contrast showed severe bilateral hydroureteronephrosis, unchanged from prior exam.  High density material seen dependently within the bilateral renal pelves and mid right ureter, likely sediment or blood products.  Chronic bladder wall thickening and perivesicular fat stranding.  Patient was given IV fentanyl  25 mcg.  EDP discussed with urology resident Dr. Mitchell and IR Dr. Hughes.  They recommended CT angio abdomen/pelvis.  If evidence of active extravasation IR will consider embolization in the morning.  Both teams will consult tomorrow.  The hospitalist service was consulted for admission.  Review of Systems: All systems reviewed and are negative except as documented in history of present illness above.   Past Medical History:  Diagnosis Date   Anemia    AV fistula    Cancer (HCC)    prostate and bladder cancer - 2022 diagnosed   CHF (congestive heart failure) (HCC)    patient denies   ESRD on hemodialysis (HCC)    MWF [facility]   History of blood transfusion 03/2020   Hypertension    history of, no longer taking medication at this time   Morbid obesity (HCC)    Parathyroid disorder     Past Surgical History:  Procedure Laterality Date   AV FISTULA PLACEMENT Left 05/24/2014   Procedure: BRACHIOCEPHALIC ARTERIOVENOUS (AV) FISTULA CREATION;  Surgeon: Lonni GORMAN Blade, MD;  Location: [facility] OR;  Service: Vascular;  Laterality: Left;   AV FISTULA PLACEMENT Left 09/15/2023   Procedure: LEFT ARM ARTERIOVENOUS (AV) FISTULA REVISION  AND PLICATION;  Surgeon: Gretta Lonni PARAS, MD;  Location: [facility] OR;  Service: Vascular;  Laterality: Left;   ESOPHAGOGASTRODUODENOSCOPY (EGD) WITH PROPOFOL  N/A 04/03/2020   Procedure: ESOPHAGOGASTRODUODENOSCOPY (EGD) WITH PROPOFOL ;  Surgeon: Saintclair Jasper, MD;  Location: [facility] ENDOSCOPY;  Service: Gastroenterology;  Laterality: N/A;   PROSTATE BIOPSY N/A 11/27/2020   Procedure: BIOPSY  TRANSRECTAL ULTRASONIC PROSTATE (TUBP);  Surgeon: Carolee Sherwood JONETTA DOUGLAS, MD;  Location: [facility] ORS;  Service: Urology;  Laterality: N/A;  REQUESTING 27 MINS FOR CASE   REVISON OF ARTERIOVENOUS FISTULA Left 01/08/2021   Procedure: REVISON PLICATION OF ARTERIOVENOUS FISTULA LEFT;  Surgeon: Sheree Penne Lonni, MD;  Location: [facility] OR;  Service: Vascular;  Laterality: Left;   SURGICAL EXCISION OF EXCESSIVE SKIN Left 09/15/2023   Procedure: SURGICAL EXCISION OF EXCESSIVE SKIN, ULCERATIVE SKIN;  Surgeon: Gretta Lonni PARAS, MD;  Location: [facility] OR;  Service: Vascular;  Laterality: Left;   THROMBECTOMY W/ EMBOLECTOMY Left 09/15/2023   Procedure: THROMBECTOMY ARTERIOVENOUS FISTULA;  Surgeon: Gretta Lonni PARAS, MD;  Location: [facility] OR;  Service: Vascular;  Laterality: Left;   TRANSURETHRAL RESECTION OF BLADDER TUMOR N/A 11/27/2020   Procedure: TRANSURETHRAL RESECTION OF BLADDER TUMOR (TURBT);  Surgeon: Carolee Sherwood JONETTA DOUGLAS, MD;  Location: [facility] ORS;  Service: Urology;  Laterality: N/A;  NEED PARALYSIS    Social History: Social History   Tobacco Use   Smoking status: Never    Passive exposure: Never   Smokeless tobacco: Never  Vaping Use   Vaping status: Never Used  Substance Use Topics   Alcohol use: No   Drug use: No   No Known Allergies  Family History  Problem Relation Age of Onset   Diabetes Mother    Hypertension Mother    Heart attack Mother 43   Stroke Father    Hypertension Father    Depression Father    Diabetes Brother    Hypertension Brother    Stroke Brother 48   Alcohol abuse Brother    Asthma Brother    Colon cancer Neg Hx    Esophageal cancer Neg Hx    Stomach cancer Neg Hx      Prior to Admission medications   Medication Sig Start Date End Date Taking? Authorizing Provider  atorvastatin  (LIPITOR) 40 MG tablet Take 1 tablet (40 mg total) by mouth daily. 05/25/23   Pearlean Manus, MD  B Complex-C-Folic Acid (B-COMPLEX/FOLIC ACID/VITAMIN C) TBCR Take 1 tablet by mouth daily.  11/02/23   [provider]  B Complex-C-Folic Acid (RENA-VITE RX) 1 MG TABS Take 1 tablet by mouth daily. 11/21/23   [provider]  cinacalcet  (SENSIPAR ) 60 MG tablet Take 60 mg by mouth daily.    [provider]  guaiFENesin  (MUCINEX ) 600 MG 12 hr tablet Take 1 tablet (600 mg total) by mouth 2 (two) times daily as needed for cough or to loosen phlegm. 05/25/23   Pearlean Manus, MD  guaifenesin  (ROBITUSSIN) 100 MG/5ML syrup Take 200 mg by mouth 3 (three) times daily as needed for cough.    [provider]  hydrocortisone  (ANUSOL -HC) 2.5 % rectal cream Place 1 Application rectally 2 (two) times daily. 05/09/24   May, Deanna J, NP  Methoxy PEG-Epoetin  Beta (MIRCERA IJ) Mircera 10/17/23 10/15/24  [provider]  midodrine  (PROAMATINE ) 5 MG tablet Take 5 mg by mouth. Taking Monday, Wednesday and Friday at dialysis 10/07/23   [provider]  sevelamer  (RENAGEL ) 800 MG tablet  05/04/21   [provider]  sevelamer  carbonate (RENVELA ) 800 MG  tablet Take 2,400-3,200 mg by mouth 2 (two) times daily. 05/17/23   [provider]  terbinafine  (LAMISIL ) 250 MG tablet TAKE 1 TABLET BY MOUTH EVERY DAY 12/22/23   Hyatt, Max T, DPM  trospium (SANCTURA) 20 MG tablet Take 20 mg by mouth. 03/14/24   [provider]  Vitamin D , Ergocalciferol , (DRISDOL) 1.25 MG (50000 UNIT) CAPS capsule Take by mouth. 09/06/22   [provider]    Physical Exam: Vitals:   05/13/24 1945 05/13/24 2015 05/13/24 2145 05/13/24 2255  BP: 135/81 132/87 (!) 143/86   Pulse: 84 84 87   Resp: 17 16 (!) 22   Temp:    (!) 97 F (36.1 C)  TempSrc:    Temporal  SpO2: 99% 100% 100%   Weight:      Height:       Constitutional: Resting in bed with head elevated, NAD, calm, comfortable Eyes: EOMI, lids and conjunctivae normal ENMT: Mucous membranes are moist. Posterior pharynx clear of any exudate or lesions.Normal dentition.  Neck: normal, supple, no  masses. Respiratory: clear to auscultation bilaterally, no wheezing, no crackles. Normal respiratory effort. No accessory muscle use.  Cardiovascular: Regular rate and rhythm, no murmurs / rubs / gallops. No extremity edema. 2+ pedal pulses.  LUE AVF with palpable thrill. Abdomen: Suprapubic tenderness, no masses palpated. Musculoskeletal: no clubbing / cyanosis. No joint deformity upper and lower extremities. Good ROM, no contractures. Normal muscle tone.  Skin: no rashes, lesions, ulcers. No induration Neurologic: Sensation intact. Strength 5/5 in all 4.  Psychiatric: Normal judgment and insight. Alert and oriented x 3. Normal mood.   EKG: Personally reviewed. Sinus rhythm, rate 87, PAC.  Assessment/Plan Principal Problem:   Hematuria Active Problems:   Anemia of chronic disease   ESRD (end stage renal disease) on dialysis [facility])   Bladder cancer (HCC)   Paroxysmal SVT (supraventricular tachycardia)   Devin Bullock is a 58 y.o. male with medical history significant for ESRD on MWF HD, prostate and bladder cancer s/p TURBT 2022, anemia of CKD, paroxysmal SVT, history of spontaneous right perinephric hematoma (June 2025) who is admitted with gross hematuria/passing blood clots.  Assessment and Plan: Hematuria with large blood clots Prostate/bladder cancer History of spontaneous right perinephric hematoma June 2025: Patient primarily passing large volume blood clots.  Normally is anuric.  Imaging shows likely blood products involving the right renal collecting system.  CT angio do not show active extravasation. - Urology to consult in a.m. - Repeat CBC in a.m.  Paroxysmal SVT: Present on arrival to ED and resolved after patient took his home dose metoprolol .  Continue Lopressor  12.5 mg twice daily.  Anemia of CKD: Hemoglobin 7.7 on admission.  Repeat CBC in a.m. and transfuse for <7.0.  ESRD on MWF HD: Will need nephrology consult in a.m. for routine dialysis.  Continue midodrine  on  dialysis days.  Hyperlipidemia: Continue atorvastatin .   DVT prophylaxis: SCDs Start: 05/13/24 2350 Code Status: Full code, confirmed with patient on admission Family Communication: Discussed with patient, he has discussed with his close contacts Disposition Plan: From home, dispo pending clinical progress Consults called: EDP spoke with urology, IR Severity of Illness: The appropriate patient status for this patient is OBSERVATION. Observation status is judged to be reasonable and necessary in order to provide the required intensity of service to ensure the patient's safety. The patient's presenting symptoms, physical exam findings, and initial radiographic and laboratory data in the context of their medical condition is felt to place them  at decreased risk for further clinical deterioration. Furthermore, it is anticipated that the patient will be medically stable for discharge from the hospital within 2 midnights of admission.   Jorie Blanch MD [facility]  If 7PM-7AM, please contact night-coverage [email or web address]  05/14/2024, 12:00 AM

## 2024-05-13 NOTE — ED Notes (Signed)
Patient stated he does not produce urine

## 2024-05-13 NOTE — ED Triage Notes (Signed)
 Pt BIB [facility] from home. Pt endorses blood from penis x1day. Passing large long clots are passing. On monitor 160-170 SVT, initially 180s. Refused adenosine  en route due to recently taking metoprolol  1 hour ago. On dialysis and doesn't make urine until yesterday when he started feeling urge to urinate.   20g R AC 100cc NS given en route.  [facility] VS 136/76 P 160 96RA  CBG 127

## 2024-05-13 NOTE — ED Provider Notes (Signed)
 [place] EMERGENCY DEPARTMENT AT [facility] Provider Note   CSN: [id number] Arrival date & time: 05/13/24  8167     Patient presents with: Tachycardia and Hematuria   Devin Bullock is a 58 y.o. male.   58 year old male history of prostate, bladder cancer, and spontaneous perinephric hematoma who presents to the emergency department hematuria.  Patient reports that yesterday started passing large clots of blood.  Says that he is an uric so this is very typical for him.  Says that he has had urinary tract infections since being diagnosed with ESRD and is concerned that he may have 1.  Currently undergoing treatment for his bladder cancer and is going to have surgery done on the sixth of next month via cystoscopy.  Did have a spontaneous perinephric hematoma several months ago and said that he had hematuria with it as well.  Denies any fevers or flank pains.  He is not on blood thinners.  Felt some palpitations and was concerned that he was back in SVT so took some metoprolol  just prior to arrival.  Declined adenosine  by EMS       Prior to Admission medications   Medication Sig Start Date End Date Taking? Authorizing Provider  atorvastatin  (LIPITOR) 40 MG tablet Take 1 tablet (40 mg total) by mouth daily. 05/25/23  Yes Emokpae, Courage, MD  B Complex-C-Folic Acid (RENA-VITE RX) 1 MG TABS Take 1 tablet by mouth daily. 11/21/23  Yes [provider]  cinacalcet  (SENSIPAR ) 60 MG tablet Take 60 mg by mouth daily.   Yes [provider]  guaifenesin  (ROBITUSSIN) 100 MG/5ML syrup Take 200 mg by mouth 3 (three) times daily as needed for cough.   Yes [provider]  hydrocortisone  (ANUSOL -HC) 2.5 % rectal cream Place 1 Application rectally 2 (two) times daily. 05/09/24  Yes May, Deanna J, NP  Methoxy PEG-Epoetin  Beta (MIRCERA IJ) Mircera 10/17/23 10/15/24 Yes [provider]  metoprolol  tartrate (LOPRESSOR ) 25 MG tablet Take 12.5 mg by mouth 2 (two) times daily.  04/09/24  Yes [provider]  midodrine  (PROAMATINE ) 5 MG tablet Take 5 mg by mouth. Taking Monday, Wednesday and Friday at dialysis 10/07/23  Yes [provider]  sevelamer  carbonate (RENVELA ) 800 MG tablet Take 2,400 mg by mouth 3 (three) times daily with meals. Take 1 tablet by mouth a snack 05/17/23  Yes [provider]    Allergies: Patient has no known allergies.    Review of Systems  Updated Vital Signs BP (!) 143/86   Pulse 87   Temp (!) 97 F (36.1 C) (Temporal)   Resp (!) 22   Ht 6' 4 (1.93 m)   Wt 136.1 kg   SpO2 100%   BMI 36.52 kg/m   Physical Exam Vitals and nursing note reviewed.  Constitutional:      General: He is not in acute distress.    Appearance: He is well-developed.  HENT:     Head: Normocephalic and atraumatic.     Right Ear: External ear normal.     Left Ear: External ear normal.     Nose: Nose normal.  Eyes:     Extraocular Movements: Extraocular movements intact.     Conjunctiva/sclera: Conjunctivae normal.     Pupils: Pupils are equal, round, and reactive to light.  Cardiovascular:     Rate and Rhythm: Normal rate and regular rhythm.  Pulmonary:     Effort: Pulmonary effort is normal.  Abdominal:     General: There  is no distension.     Palpations: Abdomen is soft. There is no mass.     Tenderness: There is no abdominal tenderness. There is no right CVA tenderness, left CVA tenderness or guarding.  Musculoskeletal:     Cervical back: Normal range of motion and neck supple.  Neurological:     Mental Status: He is alert. Mental status is at baseline.  Psychiatric:        Mood and Affect: Mood normal.        Behavior: Behavior normal.     (all labs ordered are listed, but only abnormal results are displayed) Labs Reviewed  CBC - Abnormal; Notable for the following components:      Result Value   RBC 2.39 (*)    Hemoglobin 7.7 (*)    HCT 24.3 (*)    MCV 101.7 (*)    RDW 18.0 (*)    nRBC 0.6 (*)    All  other components within normal limits  COMPREHENSIVE METABOLIC PANEL WITH GFR - Abnormal; Notable for the following components:   Chloride 93 (*)    Glucose, Bld 110 (*)    BUN 42 (*)    Creatinine, Ser 10.52 (*)    GFR, Estimated 5 (*)    Anion gap 20 (*)    All other components within normal limits  PROTIME-INR - Abnormal; Notable for the following components:   Prothrombin Time 16.0 (*)    All other components within normal limits  I-STAT CHEM 8, ED - Abnormal; Notable for the following components:   Chloride 97 (*)    BUN 40 (*)    Creatinine, Ser 11.50 (*)    Glucose, Bld 111 (*)    Calcium , Ion 1.05 (*)    Hemoglobin 8.2 (*)    HCT 24.0 (*)    All other components within normal limits  I-STAT CG4 LACTIC ACID, ED - Abnormal; Notable for the following components:   Lactic Acid, Venous 2.3 (*)    All other components within normal limits  TROPONIN I (HIGH SENSITIVITY) - Abnormal; Notable for the following components:   Troponin I (High Sensitivity) 47 (*)    All other components within normal limits  TROPONIN I (HIGH SENSITIVITY) - Abnormal; Notable for the following components:   Troponin I (High Sensitivity) 52 (*)    All other components within normal limits  BRAIN NATRIURETIC PEPTIDE  APTT  URINALYSIS, ROUTINE W REFLEX MICROSCOPIC  HIV ANTIBODY (ROUTINE TESTING W REFLEX)  CBC  BASIC METABOLIC PANEL WITH GFR  TYPE AND SCREEN    EKG: EKG Interpretation Date/Time:  Sunday May 13 2024 19:37:31 EDT Ventricular Rate:  87 PR Interval:  189 QRS Duration:  114 QT Interval:  380 QTC Calculation: 458 R Axis:   -42  Text Interpretation: Sinus rhythm Atrial premature complex Abnormal R-wave progression, early transition LVH with IVCD, LAD and secondary repol abnrm Confirmed by Yolande Charleston [phone]) on 05/13/2024 7:46:26 PM  Radiology: CT Angio Abd/Pel W and/or Wo Contrast Result Date: 05/13/2024 EXAM: CTA ABDOMEN AND PELVIS WITHOUT AND WITH CONTRAST 05/13/2024  10:38:48 PM TECHNIQUE: CTA images of the abdomen and pelvis without and with intravenous contrast (100 mL iohexol  350 mg/mL injection). Three-dimensional MIP/volume rendered formations were performed. Automated exposure control, iterative reconstruction, and/or weight based adjustment of the mA/kV was utilized to reduce the radiation dose to as low as reasonably achievable. COMPARISON: CT abdomen and pelvis dated 05/13/2024. CLINICAL HISTORY: Hematuria. FINDINGS: VASCULATURE: AORTA: Aortic atherosclerotic calcification. No acute finding. No  abdominal aortic aneurysm. No dissection. CELIAC TRUNK: No acute finding. No occlusion or significant stenosis. SUPERIOR MESENTERIC ARTERY: No acute finding. No occlusion or significant stenosis. RENAL ARTERIES: No acute finding. No occlusion or significant stenosis. ILIAC ARTERIES: No acute finding. No occlusion or significant stenosis. LIVER: Low density lesions in the liver, likely benign cysts. GALLBLADDER AND BILE DUCTS: Gallbladder is unremarkable. No biliary ductal dilatation. SPLEEN: The spleen is unremarkable. PANCREAS: The pancreas is unremarkable. ADRENAL GLANDS: Bilateral adrenal glands demonstrate no acute abnormality. KIDNEYS, URETERS AND BLADDER: Asymmetric right greater than left perinephric stranding. Bilateral cortical renal atrophy. Multicystic kidneys. Parenchymal calcification versus nonobstructing stones in both kidneys. Severe right and moderate left hydronephrosis. Marked dilation of the right ureter. Hyperdensity in the both renal pelvises does not substantially change between phases. Hyperdensity in the right ureter also does not change between phases. This may represent sediment or clotted blood. No significant excretion of contrast from CT abdomen and pelvis early or today at 08:40 pm compatible with poor renal function. Irregular thick-walled bladder. The bladder is nondistended. Trace perivesical stranding. GI AND BOWEL: Stomach and duodenal sweep  demonstrate no acute abnormality. There is no bowel obstruction. No abnormal bowel wall thickening or distension. REPRODUCTIVE: Reproductive organs are unremarkable. PERITONEUM AND RETRPERITONEUM: No ascites or free air. LUNG BASE: No acute abnormality. LYMPH NODES: No lymphadenopathy. BONES AND SOFT TISSUES: Diffuse sclerosis in the vertebral bodies is compatible with renal osteodystrophy. No acute soft tissue abnormality. IMPRESSION: 1. Severe right and moderate left hydronephrosis with marked dilation of the right ureter. Hyperdensity in the both renal pelvises and right ureter unchanged between phases, possibly representing sediment or blood. Urothelial malignancy could appear similarly. Neurology consult recommended. 2. No significant excretion of contrast, compatible with poor renal function. 3. Irregular thick-walled bladder with trace perivesical stranding, which may reflect cystitis or nondistention. Electronically signed by: Norman Gatlin MD 05/13/2024 10:52 PM EDT RP Workstation: [id number]   CT ABDOMEN PELVIS W CONTRAST Result Date: 05/13/2024 CLINICAL DATA:  Hematuria, prior perinephric hematoma EXAM: CT ABDOMEN AND PELVIS WITH CONTRAST TECHNIQUE: Multidetector CT imaging of the abdomen and pelvis was performed using the standard protocol following bolus administration of intravenous contrast. RADIATION DOSE REDUCTION: This exam was performed according to the departmental dose-optimization program which includes automated exposure control, adjustment of the mA and/or kV according to patient size and/or use of iterative reconstruction technique. CONTRAST:  OMNIPAQUE  IOHEXOL  350 MG/ML SOLN COMPARISON:  04/08/2024 FINDINGS: Lower chest: Chronic elevation of the left hemidiaphragm. Stable left lower lobe atelectasis. Trace pericardial effusion unchanged. Hepatobiliary: Stable hepatic hypodensities compatible with cysts. Gallbladder is unremarkable. No biliary duct dilation. Pancreas: Unremarkable.  No pancreatic ductal dilatation or surrounding inflammatory changes. Spleen: Normal in size without focal abnormality. Adrenals/Urinary Tract: Stable appearance of the adrenal glands. Stable bilateral renal atrophy and cortical thinning. Numerous bilateral renal hypodensities are compatible with cysts. No specific follow-up recommended. There is stable moderate right-sided hydroureteronephrosis. There is high density material identified within the bilateral renal pelves, as well as within the mid right ureter, which may reflect sediment or blood products. Bilateral uroepithelial lesions are felt to be far less likely. Correlation with urinalysis is recommended. The bladder is decompressed, with nonspecific bladder wall thickening and perivesicular fat stranding. The appearance is stable since prior study. Stomach/Bowel: No bowel obstruction or ileus. Normal appendix right lower quadrant. Stable 1.2 cm lipoma within the gastric fundus and 2.2 cm lipoma within the sigmoid colon. No bowel wall thickening or inflammatory change. Vascular/Lymphatic: Aortic atherosclerosis. No  enlarged abdominal or pelvic lymph nodes. Reproductive: Prostate is unremarkable. Other: No free fluid or free intraperitoneal gas. No abdominal wall hernia. Musculoskeletal: No acute or destructive bony abnormalities. Diffuse increased density throughout the visualized bony structures compatible with renal osteodystrophy. Reconstructed images demonstrate no additional findings. IMPRESSION: 1. Severe bilateral hydroureteronephrosis, unchanged since prior exam. 2. High density material seen dependently within the bilateral renal pelves and mid right ureter, likely sediment or blood products. Correlation with urinalysis recommended. 3. Chronic bladder wall thickening and perivesicular fat stranding, nonspecific given decompressed state of the bladder. Underlying cystitis cannot be excluded. 4.  Aortic Atherosclerosis (ICD10-I70.0). Electronically  Signed   By: Ozell Daring M.D.   On: 05/13/2024 21:02   DG Chest 1 View Result Date: 05/13/2024 EXAM: 1 VIEW(S) XRAY OF THE CHEST 05/13/2024 07:14:46 PM COMPARISON: 04/08/2024 CLINICAL HISTORY: Chest pain [id number]. Per chart - Pt BIB [facility] from home. Pt endorses blood from penis x1day. Passing large long clots are passing. On monitor 160-170 SVT, initially 180s. Refused adenosine  en route due to recently taking metoprolol  1 hour ago. On dialysis and doesn't make ; urine until yesterday when he started feeling urge to urinate. FINDINGS: LUNGS AND PLEURA: Left basilar atelectasis or pneumonia. This is similar to prior. Elevated left hemidiaphragm. No pulmonary edema. No pleural effusion. No pneumothorax. HEART AND MEDIASTINUM: Cardiomegaly. BONES AND SOFT TISSUES: No acute osseous abnormality. IMPRESSION: 1. Left basilar atelectasis or pneumonia, similar to prior. Electronically signed by: Norman Gatlin MD 05/13/2024 07:21 PM EDT RP Workstation: [id number]     Procedures   Medications Ordered in the ED  acetaminophen  (TYLENOL ) tablet 650 mg (has no administration in time range)    Or  acetaminophen  (TYLENOL ) suppository 650 mg (has no administration in time range)  HYDROcodone -acetaminophen  (NORCO/VICODIN) 5-325 MG per tablet 1-2 tablet (has no administration in time range)  HYDROmorphone  (DILAUDID ) injection 0.5 mg (has no administration in time range)  senna-docusate (Senokot-S) tablet 1 tablet (has no administration in time range)  ondansetron  (ZOFRAN ) tablet 4 mg (has no administration in time range)    Or  ondansetron  (ZOFRAN ) injection 4 mg (has no administration in time range)  atorvastatin  (LIPITOR) tablet 40 mg (has no administration in time range)  metoprolol  tartrate (LOPRESSOR ) tablet 12.5 mg (has no administration in time range)  midodrine  (PROAMATINE ) tablet 5 mg (has no administration in time range)  cinacalcet  (SENSIPAR ) tablet 60 mg (has no administration in time range)   sevelamer  carbonate (RENVELA ) tablet 2,400 mg (has no administration in time range)  iohexol  (OMNIPAQUE ) 350 MG/ML injection 100 mL (100 mLs Intravenous Contrast Given 05/13/24 2035)  fentaNYL  (SUBLIMAZE ) injection 25 mcg (25 mcg Intravenous Given 05/13/24 2211)  iohexol  (OMNIPAQUE ) 350 MG/ML injection 100 mL (100 mLs Intravenous Contrast Given 05/13/24 2238)    Clinical Course as of 05/14/24 0003  Sun May 13, 2024  2131 CT ABDOMEN PELVIS W CONTRAST High density material seen dependently within the bilateral renal pelves and mid right ureter, likely sediment or blood products. [RP]  2136 Lyle Civil MD from urology consulted.  Recommends talking to IR to see if they think an angiogram would be warranted and if there would be anything for them to embolize.  No acute urologic intervention is required.  Recommend trending his hemoglobins.  They will see the patient in the morning if admitted [RP]  2145 Discussed with IR who does not feel that  [RP]  2149 Dw Dr Leisa from IR who recommends CTA at this time. Embolization in the morning  if there is active extravasation.  [RP]  2230 Dr Tobie from hospitalist consulted [RP]    Clinical Course User Index [RP] Yolande Lamar BROCKS, MD                                 Medical Decision Making Amount and/or Complexity of Data Reviewed Labs: ordered. Radiology: ordered. Decision-making details documented in ED Course.  Risk Prescription drug management. Decision regarding hospitalization.   Devin Bullock is a 58 year old male history of prostate, bladder cancer, and spontaneous perinephric hematoma who presents to the emergency department hematuria.  Initial Ddx:  Hematuria, malignancy, anemia, UTI, perinephric hematoma, urinary retention  MDM/Course:  Patient presents to the emergency department with hematuria over the past day.  Has been passing large clots.  Has ESRD and is an uric but reports some discomfort with this.  No other infectious  symptoms right now.  No flank pain.  He is not on blood thinners.  Lab work showed that his hemoglobin is 7.7 which is down from his baseline of around 9.  He is currently hemodynamically stable but apparently was in SVT for EMS before converting back to sinus rhythm.  Did take his metoprolol  prior to this happening.  A CT scan which showed possible blood in the right renal collecting system and ureter with decompressed bladder.  Discussed with urology and IR who recommended CTA which was obtained that did not show any additional acute abnormality or extravasation.  Discussed with hospitalist for admission for trending of his hemoglobin and to ensure that the bleeding does not worsen.  IR and nephrology to see in the morning.  Did consider infection but given the fact that he is an uric feel this slightly unlikely.    This patient presents to the ED for concern of complaints listed in HPI, this involves an extensive number of treatment options, and is a complaint that carries with it a high risk of complications and morbidity. Disposition including potential need for admission considered.   Dispo: Admit to Floor  Records reviewed Outpatient Clinic Notes The following labs were independently interpreted: Chemistry and show CKD I independently reviewed the following imaging with scope of interpretation limited to determining acute life threatening conditions related to emergency care: CT Abdomen/Pelvis and agree with the radiologist interpretation with the following exceptions: none I personally reviewed and interpreted cardiac monitoring: normal sinus rhythm  I personally reviewed and interpreted the pt's EKG: see above for interpretation  I have reviewed the patients home medications and made adjustments as needed Consults: Hospitalist, Urology, and IR  Portions of this note were generated with Scientist, clinical (histocompatibility and immunogenetics). Dictation errors may occur despite best attempts at proofreading.     Final  diagnoses:  Gross hematuria  Anemia, unspecified type  Hydronephrosis, unspecified hydronephrosis type    ED Discharge Orders     None          Yolande Lamar BROCKS, MD 05/14/24 0003

## 2024-05-14 DIAGNOSIS — N132 Hydronephrosis with renal and ureteral calculous obstruction: Secondary | ICD-10-CM | POA: Diagnosis present

## 2024-05-14 DIAGNOSIS — I5033 Acute on chronic diastolic (congestive) heart failure: Secondary | ICD-10-CM | POA: Diagnosis present

## 2024-05-14 DIAGNOSIS — Z905 Acquired absence of kidney: Secondary | ICD-10-CM | POA: Diagnosis not present

## 2024-05-14 DIAGNOSIS — Z823 Family history of stroke: Secondary | ICD-10-CM | POA: Diagnosis not present

## 2024-05-14 DIAGNOSIS — N2581 Secondary hyperparathyroidism of renal origin: Secondary | ICD-10-CM | POA: Diagnosis present

## 2024-05-14 DIAGNOSIS — Z833 Family history of diabetes mellitus: Secondary | ICD-10-CM | POA: Diagnosis not present

## 2024-05-14 DIAGNOSIS — Z8551 Personal history of malignant neoplasm of bladder: Secondary | ICD-10-CM | POA: Diagnosis not present

## 2024-05-14 DIAGNOSIS — D638 Anemia in other chronic diseases classified elsewhere: Secondary | ICD-10-CM

## 2024-05-14 DIAGNOSIS — Y842 Radiological procedure and radiotherapy as the cause of abnormal reaction of the patient, or of later complication, without mention of misadventure at the time of the procedure: Secondary | ICD-10-CM | POA: Diagnosis present

## 2024-05-14 DIAGNOSIS — I132 Hypertensive heart and chronic kidney disease with heart failure and with stage 5 chronic kidney disease, or end stage renal disease: Secondary | ICD-10-CM | POA: Diagnosis present

## 2024-05-14 DIAGNOSIS — E1122 Type 2 diabetes mellitus with diabetic chronic kidney disease: Secondary | ICD-10-CM | POA: Diagnosis present

## 2024-05-14 DIAGNOSIS — C678 Malignant neoplasm of overlapping sites of bladder: Secondary | ICD-10-CM

## 2024-05-14 DIAGNOSIS — D631 Anemia in chronic kidney disease: Secondary | ICD-10-CM | POA: Diagnosis present

## 2024-05-14 DIAGNOSIS — I471 Supraventricular tachycardia, unspecified: Secondary | ICD-10-CM | POA: Diagnosis present

## 2024-05-14 DIAGNOSIS — Z923 Personal history of irradiation: Secondary | ICD-10-CM | POA: Diagnosis not present

## 2024-05-14 DIAGNOSIS — Z992 Dependence on renal dialysis: Secondary | ICD-10-CM

## 2024-05-14 DIAGNOSIS — N186 End stage renal disease: Secondary | ICD-10-CM | POA: Diagnosis present

## 2024-05-14 DIAGNOSIS — Z6838 Body mass index (BMI) 38.0-38.9, adult: Secondary | ICD-10-CM | POA: Diagnosis not present

## 2024-05-14 DIAGNOSIS — E66812 Obesity, class 2: Secondary | ICD-10-CM | POA: Diagnosis present

## 2024-05-14 DIAGNOSIS — Z8249 Family history of ischemic heart disease and other diseases of the circulatory system: Secondary | ICD-10-CM | POA: Diagnosis not present

## 2024-05-14 DIAGNOSIS — R310 Gross hematuria: Secondary | ICD-10-CM | POA: Diagnosis present

## 2024-05-14 DIAGNOSIS — I700 Atherosclerosis of aorta: Secondary | ICD-10-CM | POA: Diagnosis present

## 2024-05-14 DIAGNOSIS — C679 Malignant neoplasm of bladder, unspecified: Secondary | ICD-10-CM | POA: Diagnosis present

## 2024-05-14 DIAGNOSIS — E785 Hyperlipidemia, unspecified: Secondary | ICD-10-CM

## 2024-05-14 DIAGNOSIS — I10 Essential (primary) hypertension: Secondary | ICD-10-CM | POA: Diagnosis not present

## 2024-05-14 DIAGNOSIS — I959 Hypotension, unspecified: Secondary | ICD-10-CM | POA: Diagnosis present

## 2024-05-14 DIAGNOSIS — D62 Acute posthemorrhagic anemia: Secondary | ICD-10-CM | POA: Diagnosis present

## 2024-05-14 LAB — BASIC METABOLIC PANEL WITH GFR
Anion gap: 16 — ABNORMAL HIGH (ref 5–15)
BUN: 45 mg/dL — ABNORMAL HIGH (ref 6–20)
CO2: 28 mmol/L (ref 22–32)
Calcium: 9.6 mg/dL (ref 8.9–10.3)
Chloride: 92 mmol/L — ABNORMAL LOW (ref 98–111)
Creatinine, Ser: 11.79 mg/dL — ABNORMAL HIGH (ref 0.61–1.24)
GFR, Estimated: 4 mL/min — ABNORMAL LOW (ref >60)
Glucose, Bld: 162 mg/dL — ABNORMAL HIGH (ref 70–99)
Potassium: 3.6 mmol/L (ref 3.5–5.1)
Sodium: 136 mmol/L (ref 135–145)

## 2024-05-14 LAB — HEPATITIS B SURFACE ANTIGEN: Hepatitis B Surface Ag: NONREACTIVE

## 2024-05-14 LAB — CBC
HCT: 24.1 % — ABNORMAL LOW (ref 39.0–52.0)
Hemoglobin: 7.6 g/dL — ABNORMAL LOW (ref 13.0–17.0)
MCH: 32.2 pg (ref 26.0–34.0)
MCHC: 31.5 g/dL (ref 30.0–36.0)
MCV: 102.1 fL — ABNORMAL HIGH (ref 80.0–100.0)
Platelets: 201 K/uL (ref 150–400)
RBC: 2.36 MIL/uL — ABNORMAL LOW (ref 4.22–5.81)
RDW: 18.3 % — ABNORMAL HIGH (ref 11.5–15.5)
WBC: 7.2 K/uL (ref 4.0–10.5)
nRBC: 0.4 % — ABNORMAL HIGH (ref 0.0–0.2)

## 2024-05-14 LAB — HEMOGLOBIN AND HEMATOCRIT, BLOOD
HCT: 23.7 % — ABNORMAL LOW (ref 39.0–52.0)
HCT: 21.1 % — ABNORMAL LOW (ref 39.0–52.0)
Hemoglobin: 7.3 g/dL — ABNORMAL LOW (ref 13.0–17.0)
Hemoglobin: 6.7 g/dL — CL (ref 13.0–17.0)

## 2024-05-14 LAB — HIV ANTIBODY (ROUTINE TESTING W REFLEX): HIV Screen 4th Generation wRfx: NONREACTIVE

## 2024-05-14 LAB — PREPARE RBC (CROSSMATCH)

## 2024-05-14 MED ORDER — MIDODRINE HCL 5 MG PO TABS
5.0000 mg | ORAL_TABLET | ORAL | Status: DC
  Administered 2024-05-16 – 2024-05-23 (×4): 5 mg via ORAL
  Filled 2024-05-14 (×2): qty 1

## 2024-05-14 MED ORDER — CHLORHEXIDINE GLUCONATE CLOTH 2 % EX PADS
6.0000 | MEDICATED_PAD | Freq: Every day | CUTANEOUS | Status: DC
  Administered 2024-05-15: 6 via TOPICAL

## 2024-05-14 MED ORDER — SODIUM CHLORIDE 0.9% IV SOLUTION: Freq: Once | INTRAVENOUS | Status: AC

## 2024-05-14 NOTE — Assessment & Plan Note (Signed)
 Prostate/bladder cancer History of spontaneous right perinephric hematoma June 2025: CTA and cystoscopy unable to locate the source of bleeding, concern of ureteral source based on clot morphology and a clear bladder on cystoscopy. Patient is high risk due to history of bladder cancer. Unable to tolerate Foley placement and manual irrigation. - Urology is on board -Monitor CBC

## 2024-05-14 NOTE — Progress Notes (Signed)
 Progress Note   Patient: Devin Bullock FMW:[id number] DOB: 06-12-1966 DOA: 05/13/2024     0 DOS: the patient was seen and examined on 05/14/2024   Brief hospital course: Devin Bullock is a 58 y.o. male with medical history significant for ESRD on MWF HD, prostate and bladder cancer s/p TURBT 2022, anemia of CKD, paroxysmal SVT, history of spontaneous right perinephric hematoma (June 2025) who is admitted with gross hematuria/passing blood clots.  Patient was previously admitted in June for acute blood loss anemia due to spontaneous right perinephric requiring 4 unit PRBC transfusions. He was monitored closely and did not require any other specific intervention.   He was previously receiving BCG treatments which were held due to ongoing urethral bleeding. He underwent cystoscopy 04/19/2024 which showed a tumor in the bladder. He is scheduled for TURBT on 06/21/2024.   On presentation hemodynamically stable, labs with hemoglobin of 7.7, BUN 42, creatinine 10.52, BNP 62.5, troponin 47>> 52, lactic acid 2.3.  CT abdomen/pelvis with contrast showed severe bilateral hydroureteronephrosis, unchanged from prior exam. High density material seen dependently within the bilateral renal pelves and mid right ureter, likely sediment or blood products. Chronic bladder wall thickening and perivesicular fat stranding.   Patient was given IV fentanyl  25 mcg. EDP discussed with urology resident Dr. Mitchell and IR Dr. Hughes. They recommended CT angio abdomen/pelvis. If evidence of active extravasation IR will consider embolization in the morning.   9/29: Vital stable, hemoglobin 7.6, CTA abdomen and pelvis with no active bleeding, rest of the findings similar to the above CT. urology did a bedside cystoscopy, no active clotting or bleeding in bladder, string-like clots likely ureteral, no obvious source of bleeding found at this time.  Unable to tolerate Foley placement or irrigation.  Urology is recommending close  monitoring of hemoglobin and if remains stable can be discharged tomorrow with a close outpatient follow-up.  Going for HD today  Assessment and Plan: * Hematuria Prostate/bladder cancer History of spontaneous right perinephric hematoma June 2025: CTA and cystoscopy unable to locate the source of bleeding, concern of ureteral source based on clot morphology and a clear bladder on cystoscopy. Patient is high risk due to history of bladder cancer. Unable to tolerate Foley placement and manual irrigation. - Urology is on board -Monitor CBC  Paroxysmal SVT (supraventricular tachycardia) Had 1 episode on arrival which resolved after taking home dose of metoprolol . - Continue to monitor -Continue with metoprolol   Anemia of chronic disease Hemoglobin at 7.6 this morning. - Monitor hemoglobin closely-Q8 hourly ordered -Transfuse if below 7  ESRD (end stage renal disease) on dialysis [facility]) Patient follows Monday, Wednesday and Friday dialysis schedule. - Nephrology is consulted -Continue with routine dialysis -Continue with home midodrine  on dialysis days  Hyperlipidemia - Continue with home atorvastatin    Subjective: Patient was seen and examined after cystoscopy today.  Still passing some clots.  Seems like having low health-related literacy.  Physical Exam: Vitals:   05/14/24 0502 05/14/24 0504 05/14/24 0800 05/14/24 0957  BP:  116/65 131/80   Pulse:  (!) 101 97   Resp:  20 (!) 21   Temp: 98 F (36.7 C)   98 F (36.7 C)  TempSrc: Oral   Oral  SpO2:  98% 100%   Weight:      Height:       General.  Obese gentleman, in no acute distress. Pulmonary.  Lungs clear bilaterally, normal respiratory effort. CV.  Regular rate and rhythm, no JVD, rub or murmur.  Abdomen.  Soft, nontender, nondistended, BS positive. CNS.  Alert and oriented .  No focal neurologic deficit. Extremities.  No edema, no cyanosis, pulses intact and symmetrical.  Data Reviewed: Prior data  reviewed  Family Communication: Discussed with patient  Disposition: Status is: Inpatient Remains inpatient appropriate because: Severity of illness  Planned Discharge Destination: Home  DVT prophylaxis.  SCDs Time spent: 50 minutes  This record has been created using Conservation officer, historic buildings. Errors have been sought and corrected,but may not always be located. Such creation errors do not reflect on the standard of care.   Author: Amaryllis Dare, MD 05/14/2024 1:13 PM  For on call review [email or web address].

## 2024-05-14 NOTE — Procedures (Signed)
   Urology Procedure Note:   Procedure: flexible cysto bedside  Patient is a 58 year old male with high-grade bladder and prostate cancer known to our practice.  He presents today for passage of clot material per urethra.  While he does not have any sensation of retention he finds this uncomfortable and has requested Foley catheter placement and attempted hand irrigation.  Please see separate consult note.  Procedure was explained and patient provided consent.  Patient was prepped and draped in the usual sterile fashion.  I attempted to pass a 29f three-way catheter which felt as if it reached the bladder but patient would not tolerate more than a couple of millimeters of inflation of the retention balloon.  With dual benefit of being able to visualize the anterior of the bladder for clot burden in addition to confirm over wire placement, the shared decision was made to proceed with flexible bedside cystoscopy.  The cystoscope was navigated into the bladder without much difficulty.  The field of vision was very poor due to malfunctioning light source.  Little detail could be appreciated but overwhelmingly the bladder was clear.  At this point a straight zip wire was advanced through the scope and cystoscope was offloaded.  I then attempted to advance a well-lubricated 63f three-way hematuria coud over wire using Seldinger technique.  Again, patient expressed extreme discomfort when any attempt was made to inflate the retention balloon.  The hematuria coud was offloaded and I then reinserted the cystoscope tracking the wire to the level of the bladder and confirming its placement.  A final attempt was made to place catheter over wire again patient did not tolerate retention balloon.  We discussed that now having made multiple passes in his bladder and not visualizing any clot material, as noted on CT imaging, I still did not feel that he needed a Foley catheter or hand irrigation.  He agreed at this point  and shared decision was made to abort the procedure.   Ole Bourdon, NP [facility] Pager: [phone]

## 2024-05-14 NOTE — ED Notes (Addendum)
 At bedside assisting procedure with Urologist.

## 2024-05-14 NOTE — Consult Note (Signed)
 Urology Consult Note   Requesting Attending Physician:  Caleen Qualia, MD Service Providing Consult: Urology  Consulting Attending: Dr. Lovie   Reason for Consult:  gross hematuria  HPI: Devin Bullock is seen in consultation for reasons noted above at the request of Caleen Qualia, MD. Patient is a 58 y.o. male presenting to [facility] emergency department with complaints of gross hematuria and clot passage.  PMH significant for malignancy of the bladder-overlapping sites, s/p gemcitabine installations and pending BCG, Gleason 4+4 = 8 prostate cancer-s/p XRT and ADT, ESRD on dialysis, and radiation cystitis with frequent gross hematuria.  He was last seen in our office in 2022. He was followed by Dr. Alvaro who recommended cystoprostatectomy and nephrectomy.  Patient declined and has since established care with [facility] for the above treatment.  He is followed by Dr. Tsivian and Nicholaus.  He was seen in their clinic approximately a week ago and has cystoscopy scheduled for 06/21/2024.  Patient presents to the emergency department with complaint of discomfort when passing clot material per urethra.  On arrival he was alert, oriented, and in no distress.  We reviewed his imaging, case, and plan.  ------------------  Assessment:   58 y.o. male with high-grade bladder and prostate cancer, now with radiation cystitis and gross hematuria.   Recommendations: # Gross hematuria  Hemoglobin is somewhat depressed but stable.  Patient trends very low at baseline due to ESRD.    Minor amount of blood products noted in kidneys.  Bladder is well decompressed on CT scan.  Clots are long and stringy which could be casting from ureters but difficult to tell where bleeding is coming from.  With his established bladder cancer plus radiation cystitis, I would be inclined to think that he is bleeding from his bladder with blood products potentially refluxing.  No acute surgical interventions indicated at  this time.  We would recommend close follow-up for hemoglobin checks, which should be accomplished easily at his dialysis appointments, and follow-up closely with his urologist at [facility].  Have recommended he ask his appointment to be moved up if possible with these new findings.  Patient is having a great deal of difficulty understanding that the recommended surgical course he was offered and declined was designed to prevent the complications he is experiencing now.  I have explained that while I absolutely encourage and respect his decision to explore second opinions, choose another provider, and medical direction, it is impossible to carry out both of those plans of care simultaneously.  I have encouraged him to stay the course with what ever he chooses.  Patient insist on attempting clot evacuation though I was not able to visualize retained material on CT imaging.  Attempted to place hematuria catheter which he found exceedingly painful and catheter would not advance.  Though I expected this was from bearing down, we elected to proceed with cystoscopy.  The zip wire was confirmed in bladder, hematuria coude placed over wire,  and patient still would not tolerate inflation of retention balloon.  I confirmed that there was still no clot in his bladder during direct visualization, and he decided that he no longer wanted a Foley catheter. Likely significantly atrophied bladder.   May continue to pass clot material. Please call with questions.   Case and plan discussed with Dr. Lovie  Past Medical History: Past Medical History:  Diagnosis Date   Anemia    AV fistula    Cancer (HCC)    prostate and  bladder cancer - 2022 diagnosed   CHF (congestive heart failure) (HCC)    patient denies   ESRD on hemodialysis (HCC)    M[facility]   History of blood transfusion 03/2020   Hypertension    history of, no longer taking medication at this time   Morbid obesity (HCC)    Parathyroid disorder      Past Surgical History:  Past Surgical History:  Procedure Laterality Date   AV FISTULA PLACEMENT Left 05/24/2014   Procedure: BRACHIOCEPHALIC ARTERIOVENOUS (AV) FISTULA CREATION;  Surgeon: Lonni GORMAN Blade, MD;  Location: [facility] OR;  Service: Vascular;  Laterality: Left;   AV FISTULA PLACEMENT Left 09/15/2023   Procedure: LEFT ARM ARTERIOVENOUS (AV) FISTULA REVISION AND PLICATION;  Surgeon: Gretta Lonni PARAS, MD;  Location: [facility] OR;  Service: Vascular;  Laterality: Left;   ESOPHAGOGASTRODUODENOSCOPY (EGD) WITH PROPOFOL  N/A 04/03/2020   Procedure: ESOPHAGOGASTRODUODENOSCOPY (EGD) WITH PROPOFOL ;  Surgeon: Saintclair Jasper, MD;  Location: [facility] ENDOSCOPY;  Service: Gastroenterology;  Laterality: N/A;   PROSTATE BIOPSY N/A 11/27/2020   Procedure: BIOPSY TRANSRECTAL ULTRASONIC PROSTATE (TUBP);  Surgeon: Carolee Sherwood JONETTA DOUGLAS, MD;  Location: [facility] ORS;  Service: Urology;  Laterality: N/A;  REQUESTING 10 MINS FOR CASE   REVISON OF ARTERIOVENOUS FISTULA Left 01/08/2021   Procedure: REVISON PLICATION OF ARTERIOVENOUS FISTULA LEFT;  Surgeon: Sheree Penne Lonni, MD;  Location: [facility] OR;  Service: Vascular;  Laterality: Left;   SURGICAL EXCISION OF EXCESSIVE SKIN Left 09/15/2023   Procedure: SURGICAL EXCISION OF EXCESSIVE SKIN, ULCERATIVE SKIN;  Surgeon: Gretta Lonni PARAS, MD;  Location: [facility] OR;  Service: Vascular;  Laterality: Left;   THROMBECTOMY W/ EMBOLECTOMY Left 09/15/2023   Procedure: THROMBECTOMY ARTERIOVENOUS FISTULA;  Surgeon: Gretta Lonni PARAS, MD;  Location: [facility] OR;  Service: Vascular;  Laterality: Left;   TRANSURETHRAL RESECTION OF BLADDER TUMOR N/A 11/27/2020   Procedure: TRANSURETHRAL RESECTION OF BLADDER TUMOR (TURBT);  Surgeon: Carolee Sherwood JONETTA DOUGLAS, MD;  Location: [facility] ORS;  Service: Urology;  Laterality: N/A;  NEED PARALYSIS    Medication: Current Facility-Administered Medications  Medication Dose Route Frequency Provider Last Rate Last Admin   acetaminophen  (TYLENOL ) tablet 650 mg  650 mg Oral  Q6H PRN Patel, Vishal R, MD       Or   acetaminophen  (TYLENOL ) suppository 650 mg  650 mg Rectal Q6H PRN Patel, Vishal R, MD       atorvastatin  (LIPITOR) tablet 40 mg  40 mg Oral Daily Patel, Vishal R, MD       cinacalcet  (SENSIPAR ) tablet 60 mg  60 mg Oral Q breakfast Patel, Vishal R, MD       HYDROcodone -acetaminophen  (NORCO/VICODIN) 5-325 MG per tablet 1-2 tablet  1-2 tablet Oral Q4H PRN Patel, Vishal R, MD       HYDROmorphone  (DILAUDID ) injection 0.5 mg  0.5 mg Intravenous Q3H PRN Patel, Vishal R, MD   0.5 mg at 05/14/24 0010   metoprolol  tartrate (LOPRESSOR ) tablet 12.5 mg  12.5 mg Oral BID Patel, Vishal R, MD       midodrine  (PROAMATINE ) tablet 5 mg  5 mg Oral Q M,W,F-HD Laron Agent, RPH       ondansetron  (ZOFRAN ) tablet 4 mg  4 mg Oral Q6H PRN Patel, Vishal R, MD       Or   ondansetron  (ZOFRAN ) injection 4 mg  4 mg Intravenous Q6H PRN Patel, Vishal R, MD   4 mg at 05/14/24 0440   senna-docusate (Senokot-S) tablet 1 tablet  1 tablet Oral QHS PRN Patel, Vishal R,  MD       sevelamer  carbonate (RENVELA ) tablet 2,400 mg  2,400 mg Oral TID WC Patel, Vishal R, MD       Current Outpatient Medications  Medication Sig Dispense Refill   atorvastatin  (LIPITOR) 40 MG tablet Take 1 tablet (40 mg total) by mouth daily. 30 tablet 2   B Complex-C-Folic Acid (RENA-VITE RX) 1 MG TABS Take 1 tablet by mouth daily.     cinacalcet  (SENSIPAR ) 60 MG tablet Take 60 mg by mouth daily.     guaifenesin  (ROBITUSSIN) 100 MG/5ML syrup Take 200 mg by mouth 3 (three) times daily as needed for cough.     hydrocortisone  (ANUSOL -HC) 2.5 % rectal cream Place 1 Application rectally 2 (two) times daily. 30 g 1   Methoxy PEG-Epoetin  Beta (MIRCERA IJ) Mircera     metoprolol  tartrate (LOPRESSOR ) 25 MG tablet Take 12.5 mg by mouth 2 (two) times daily.     midodrine  (PROAMATINE ) 5 MG tablet Take 5 mg by mouth. Taking Monday, Wednesday and Friday at dialysis     sevelamer  carbonate (RENVELA ) 800 MG tablet Take 2,400 mg by  mouth 3 (three) times daily with meals. Take 1 tablet by mouth a snack      Allergies: No Known Allergies  Social History: Social History   Tobacco Use   Smoking status: Never    Passive exposure: Never   Smokeless tobacco: Never  Vaping Use   Vaping status: Never Used  Substance Use Topics   Alcohol use: No   Drug use: No    Family History Family History  Problem Relation Age of Onset   Diabetes Mother    Hypertension Mother    Heart attack Mother 64   Stroke Father    Hypertension Father    Depression Father    Diabetes Brother    Hypertension Brother    Stroke Brother 48   Alcohol abuse Brother    Asthma Brother    Colon cancer Neg Hx    Esophageal cancer Neg Hx    Stomach cancer Neg Hx     Review of Systems  Genitourinary:  Positive for dysuria and hematuria. Negative for flank pain, frequency and urgency.     Objective   Vital signs in last 24 hours: BP 131/80   Pulse 97   Temp 98 F (36.7 C) (Oral)   Resp (!) 21   Ht 6' 4 (1.93 m)   Wt 136.1 kg   SpO2 100%   BMI 36.52 kg/m   Physical Exam General: A&O, resting, appropriate HEENT: [place]/AT Pulmonary: Normal work of breathing Cardiovascular: no cyanosis Abdomen: Soft, NTTP, nondistended GU: gross hematuria with clot obstruction   Most Recent Labs: Lab Results  Component Value Date   WBC 7.2 05/14/2024   HGB 7.6 (L) 05/14/2024   HCT 24.1 (L) 05/14/2024   PLT 201 05/14/2024    Lab Results  Component Value Date   NA 136 05/14/2024   K 3.6 05/14/2024   CL 92 (L) 05/14/2024   CO2 28 05/14/2024   BUN 45 (H) 05/14/2024   CREATININE 11.79 (H) 05/14/2024   CALCIUM  9.6 05/14/2024   MG 2.0 05/21/2023   PHOS 3.5 01/19/2024    Lab Results  Component Value Date   INR 1.2 05/13/2024   APTT 29 05/13/2024     Urine Culture: @[id number] (laburin,org,[id number],[id number])@   IMAGING: CT Angio Abd/Pel W and/or Wo Contrast Result Date: 05/13/2024 EXAM: CTA ABDOMEN AND PELVIS WITHOUT AND WITH  CONTRAST 05/13/2024 10:38:48 PM TECHNIQUE:  CTA images of the abdomen and pelvis without and with intravenous contrast (100 mL iohexol  350 mg/mL injection). Three-dimensional MIP/volume rendered formations were performed. Automated exposure control, iterative reconstruction, and/or weight based adjustment of the mA/kV was utilized to reduce the radiation dose to as low as reasonably achievable. COMPARISON: CT abdomen and pelvis dated 05/13/2024. CLINICAL HISTORY: Hematuria. FINDINGS: VASCULATURE: AORTA: Aortic atherosclerotic calcification. No acute finding. No abdominal aortic aneurysm. No dissection. CELIAC TRUNK: No acute finding. No occlusion or significant stenosis. SUPERIOR MESENTERIC ARTERY: No acute finding. No occlusion or significant stenosis. RENAL ARTERIES: No acute finding. No occlusion or significant stenosis. ILIAC ARTERIES: No acute finding. No occlusion or significant stenosis. LIVER: Low density lesions in the liver, likely benign cysts. GALLBLADDER AND BILE DUCTS: Gallbladder is unremarkable. No biliary ductal dilatation. SPLEEN: The spleen is unremarkable. PANCREAS: The pancreas is unremarkable. ADRENAL GLANDS: Bilateral adrenal glands demonstrate no acute abnormality. KIDNEYS, URETERS AND BLADDER: Asymmetric right greater than left perinephric stranding. Bilateral cortical renal atrophy. Multicystic kidneys. Parenchymal calcification versus nonobstructing stones in both kidneys. Severe right and moderate left hydronephrosis. Marked dilation of the right ureter. Hyperdensity in the both renal pelvises does not substantially change between phases. Hyperdensity in the right ureter also does not change between phases. This may represent sediment or clotted blood. No significant excretion of contrast from CT abdomen and pelvis early or today at 08:40 pm compatible with poor renal function. Irregular thick-walled bladder. The bladder is nondistended. Trace perivesical stranding. GI AND BOWEL: Stomach  and duodenal sweep demonstrate no acute abnormality. There is no bowel obstruction. No abnormal bowel wall thickening or distension. REPRODUCTIVE: Reproductive organs are unremarkable. PERITONEUM AND RETRPERITONEUM: No ascites or free air. LUNG BASE: No acute abnormality. LYMPH NODES: No lymphadenopathy. BONES AND SOFT TISSUES: Diffuse sclerosis in the vertebral bodies is compatible with renal osteodystrophy. No acute soft tissue abnormality. IMPRESSION: 1. Severe right and moderate left hydronephrosis with marked dilation of the right ureter. Hyperdensity in the both renal pelvises and right ureter unchanged between phases, possibly representing sediment or blood. Urothelial malignancy could appear similarly. Neurology consult recommended. 2. No significant excretion of contrast, compatible with poor renal function. 3. Irregular thick-walled bladder with trace perivesical stranding, which may reflect cystitis or nondistention. Electronically signed by: Norman Gatlin MD 05/13/2024 10:52 PM EDT RP Workstation: [id number]   CT ABDOMEN PELVIS W CONTRAST Result Date: 05/13/2024 CLINICAL DATA:  Hematuria, prior perinephric hematoma EXAM: CT ABDOMEN AND PELVIS WITH CONTRAST TECHNIQUE: Multidetector CT imaging of the abdomen and pelvis was performed using the standard protocol following bolus administration of intravenous contrast. RADIATION DOSE REDUCTION: This exam was performed according to the departmental dose-optimization program which includes automated exposure control, adjustment of the mA and/or kV according to patient size and/or use of iterative reconstruction technique. CONTRAST:  OMNIPAQUE  IOHEXOL  350 MG/ML SOLN COMPARISON:  04/08/2024 FINDINGS: Lower chest: Chronic elevation of the left hemidiaphragm. Stable left lower lobe atelectasis. Trace pericardial effusion unchanged. Hepatobiliary: Stable hepatic hypodensities compatible with cysts. Gallbladder is unremarkable. No biliary duct dilation.  Pancreas: Unremarkable. No pancreatic ductal dilatation or surrounding inflammatory changes. Spleen: Normal in size without focal abnormality. Adrenals/Urinary Tract: Stable appearance of the adrenal glands. Stable bilateral renal atrophy and cortical thinning. Numerous bilateral renal hypodensities are compatible with cysts. No specific follow-up recommended. There is stable moderate right-sided hydroureteronephrosis. There is high density material identified within the bilateral renal pelves, as well as within the mid right ureter, which may reflect sediment or blood products. Bilateral uroepithelial lesions are  felt to be far less likely. Correlation with urinalysis is recommended. The bladder is decompressed, with nonspecific bladder wall thickening and perivesicular fat stranding. The appearance is stable since prior study. Stomach/Bowel: No bowel obstruction or ileus. Normal appendix right lower quadrant. Stable 1.2 cm lipoma within the gastric fundus and 2.2 cm lipoma within the sigmoid colon. No bowel wall thickening or inflammatory change. Vascular/Lymphatic: Aortic atherosclerosis. No enlarged abdominal or pelvic lymph nodes. Reproductive: Prostate is unremarkable. Other: No free fluid or free intraperitoneal gas. No abdominal wall hernia. Musculoskeletal: No acute or destructive bony abnormalities. Diffuse increased density throughout the visualized bony structures compatible with renal osteodystrophy. Reconstructed images demonstrate no additional findings. IMPRESSION: 1. Severe bilateral hydroureteronephrosis, unchanged since prior exam. 2. High density material seen dependently within the bilateral renal pelves and mid right ureter, likely sediment or blood products. Correlation with urinalysis recommended. 3. Chronic bladder wall thickening and perivesicular fat stranding, nonspecific given decompressed state of the bladder. Underlying cystitis cannot be excluded. 4.  Aortic Atherosclerosis  (ICD10-I70.0). Electronically Signed   By: Ozell Daring M.D.   On: 05/13/2024 21:02   DG Chest 1 View Result Date: 05/13/2024 EXAM: 1 VIEW(S) XRAY OF THE CHEST 05/13/2024 07:14:46 PM COMPARISON: 04/08/2024 CLINICAL HISTORY: Chest pain [id number]. Per chart - Pt BIB [facility] from home. Pt endorses blood from penis x1day. Passing large long clots are passing. On monitor 160-170 SVT, initially 180s. Refused adenosine  en route due to recently taking metoprolol  1 hour ago. On dialysis and doesn't make ; urine until yesterday when he started feeling urge to urinate. FINDINGS: LUNGS AND PLEURA: Left basilar atelectasis or pneumonia. This is similar to prior. Elevated left hemidiaphragm. No pulmonary edema. No pleural effusion. No pneumothorax. HEART AND MEDIASTINUM: Cardiomegaly. BONES AND SOFT TISSUES: No acute osseous abnormality. IMPRESSION: 1. Left basilar atelectasis or pneumonia, similar to prior. Electronically signed by: Norman Gatlin MD 05/13/2024 07:21 PM EDT RP Workstation: [id number]    ------  Ole Bourdon, NP Pager: [phone]   Please contact the urology consult pager with any further questions/concerns.

## 2024-05-14 NOTE — ED Notes (Signed)
 Pt sitting on the edge of the bed when this Rn walked in the room. Pt complains that his pain is keeping him from sleeping. Pt states that he is passing blood clots out of his penis and his pain is 10/10. When asked to describe his pain, it feels like a snake being pulled out my ding dong. Warm blanket given to pt. Pt does not produce urine anymore, unable to obtain urine sample. Denies any other concerns at this time.

## 2024-05-14 NOTE — ED Notes (Signed)
 Pt refused Sevelamer  at this time. Pt states that he has to take this medication before he eats. Lunch tray has arrived but pt states that he needs a nap before he eats. Call light within reach and light turned off so the pt can get some rest.

## 2024-05-14 NOTE — Assessment & Plan Note (Signed)
 Had 1 episode on arrival which resolved after taking home dose of metoprolol . - Continue to monitor -Continue with metoprolol 

## 2024-05-14 NOTE — Assessment & Plan Note (Signed)
 Continue with home atorvastatin

## 2024-05-14 NOTE — Assessment & Plan Note (Signed)
 Hemoglobin at 7.6 this morning. - Monitor hemoglobin closely-Q8 hourly ordered -Transfuse if below 7

## 2024-05-14 NOTE — Consult Note (Signed)
 [facility] Renal Consultation Note    Indication for Consultation:  Management of ESRD/hemodialysis, anemia, hypertension/volume, and secondary hyperparathyroidism.  HPI: Devin Bullock is a 58 y.o. male with ESRD, HTN, Hx prostate cancer (s/p radiation + Lupron, PSA 11/2023 undetectable), and bladder cancer (s/p TURBT 2022, then chemo 2023, with recurrence.   Has been having intermittent gross hematuria and passing clots off and on, worse over the past 2 days and associated with R flank pain. No fever or chills. No CP or dyspnea. Followed closely by [facility] and getting BCG instillations previously with plan for further cystoscopy next month. Presented to the ED last night. Initial vitals showed SVT, treated with metoprolol  (he refused adenosine ). Labs with Na 139, K 3.6, BUN 45, WBC 7.2, Hgb 7.6. CT with bilateral hydronephrosis. Urology consulted - s/p bedside cystoscopy, not tolerated well. Looks like nothing further is planned on their end.  Seen in room. He is a little uncomfortable, but not acutely so. Denies CP, dyspnea, abd pain, N/V/D, edema.  Dialyzes on MWF schedule at [facility] - due for HD today. Uses L aneurysmal AVF as access.   Past Medical History:  Diagnosis Date   Anemia    AV fistula    Cancer (HCC)    prostate and bladder cancer - 2022 diagnosed   CHF (congestive heart failure) (HCC)    patient denies   ESRD on hemodialysis (HCC)    MWF [facility]   History of blood transfusion 03/2020   Hypertension    history of, no longer taking medication at this time   Morbid obesity (HCC)    Parathyroid disorder    Past Surgical History:  Procedure Laterality Date   AV FISTULA PLACEMENT Left 05/24/2014   Procedure: BRACHIOCEPHALIC ARTERIOVENOUS (AV) FISTULA CREATION;  Surgeon: Lonni GORMAN Blade, MD;  Location: [facility] OR;  Service: Vascular;  Laterality: Left;   AV FISTULA PLACEMENT Left 09/15/2023   Procedure: LEFT ARM ARTERIOVENOUS (AV) FISTULA REVISION AND  PLICATION;  Surgeon: Gretta Lonni PARAS, MD;  Location: [facility] OR;  Service: Vascular;  Laterality: Left;   ESOPHAGOGASTRODUODENOSCOPY (EGD) WITH PROPOFOL  N/A 04/03/2020   Procedure: ESOPHAGOGASTRODUODENOSCOPY (EGD) WITH PROPOFOL ;  Surgeon: Saintclair Jasper, MD;  Location: [facility] ENDOSCOPY;  Service: Gastroenterology;  Laterality: N/A;   PROSTATE BIOPSY N/A 11/27/2020   Procedure: BIOPSY TRANSRECTAL ULTRASONIC PROSTATE (TUBP);  Surgeon: Carolee Sherwood JONETTA DOUGLAS, MD;  Location: [facility] ORS;  Service: Urology;  Laterality: N/A;  REQUESTING 32 MINS FOR CASE   REVISON OF ARTERIOVENOUS FISTULA Left 01/08/2021   Procedure: REVISON PLICATION OF ARTERIOVENOUS FISTULA LEFT;  Surgeon: Sheree Penne Lonni, MD;  Location: [facility] OR;  Service: Vascular;  Laterality: Left;   SURGICAL EXCISION OF EXCESSIVE SKIN Left 09/15/2023   Procedure: SURGICAL EXCISION OF EXCESSIVE SKIN, ULCERATIVE SKIN;  Surgeon: Gretta Lonni PARAS, MD;  Location: [facility] OR;  Service: Vascular;  Laterality: Left;   THROMBECTOMY W/ EMBOLECTOMY Left 09/15/2023   Procedure: THROMBECTOMY ARTERIOVENOUS FISTULA;  Surgeon: Gretta Lonni PARAS, MD;  Location: [facility] OR;  Service: Vascular;  Laterality: Left;   TRANSURETHRAL RESECTION OF BLADDER TUMOR N/A 11/27/2020   Procedure: TRANSURETHRAL RESECTION OF BLADDER TUMOR (TURBT);  Surgeon: Carolee Sherwood JONETTA DOUGLAS, MD;  Location: [facility] ORS;  Service: Urology;  Laterality: N/A;  NEED PARALYSIS   Family History  Problem Relation Age of Onset   Diabetes Mother    Hypertension Mother    Heart attack Mother 69   Stroke Father    Hypertension Father    Depression Father  Diabetes Brother    Hypertension Brother    Stroke Brother 82   Alcohol abuse Brother    Asthma Brother    Colon cancer Neg Hx    Esophageal cancer Neg Hx    Stomach cancer Neg Hx    Social History:  reports that he has never smoked. He has never been exposed to tobacco smoke. He has never used smokeless tobacco. He reports that he does not drink alcohol and does  not use drugs.  ROS: As per HPI otherwise negative.  Physical Exam: Vitals:   05/14/24 1300 05/14/24 1315 05/14/24 1330 05/14/24 1415  BP: (!) 150/90 (!) 154/88 (!) 156/84 (!) 165/86  Pulse: 96 96 96 97  Resp: (!) 25 (!) 25 (!) 24 16  Temp:    97.7 F (36.5 C)  TempSrc:    Oral  SpO2: 96% 96% 100% 98%  Weight:      Height:         General: Well developed, well nourished, in no acute distress. Head: Normocephalic, atraumatic, sclera non-icteric, mucus membranes are moist. Neck: Supple without lymphadenopathy/masses. JVD not elevated. Lungs: Clear bilaterally to auscultation without wheezes, rales, or rhonchi. Breathing is unlabored. Heart: RRR with normal S1, S2. No murmurs, rubs, or gallops appreciated. Abdomen: Soft, non-tender, non-distended with normoactive bowel sounds. Mild R flank TTP Musculoskeletal:  Strength and tone appear normal for age. Lower extremities: No edema or ischemic changes, no open wounds. Neuro: Alert and oriented X 3. Moves all extremities spontaneously. Psych:  Responds to questions appropriately with a normal affect. Dialysis Access: L AVF +t/b  No Known Allergies Prior to Admission medications   Medication Sig Start Date End Date Taking? Authorizing Provider  atorvastatin  (LIPITOR) 40 MG tablet Take 1 tablet (40 mg total) by mouth daily. 05/25/23  Yes Emokpae, Courage, MD  B Complex-C-Folic Acid (RENA-VITE RX) 1 MG TABS Take 1 tablet by mouth daily. 11/21/23  Yes [provider]  cinacalcet  (SENSIPAR ) 60 MG tablet Take 60 mg by mouth daily.   Yes [provider]  guaifenesin  (ROBITUSSIN) 100 MG/5ML syrup Take 200 mg by mouth 3 (three) times daily as needed for cough.   Yes [provider]  hydrocortisone  (ANUSOL -HC) 2.5 % rectal cream Place 1 Application rectally 2 (two) times daily. 05/09/24  Yes May, Deanna J, NP  Methoxy PEG-Epoetin  Beta (MIRCERA IJ) Mircera 10/17/23 10/15/24 Yes [provider]  metoprolol  tartrate  (LOPRESSOR ) 25 MG tablet Take 12.5 mg by mouth 2 (two) times daily. 04/09/24  Yes [provider]  midodrine  (PROAMATINE ) 5 MG tablet Take 5 mg by mouth. Taking Monday, Wednesday and Friday at dialysis 10/07/23  Yes [provider]  sevelamer  carbonate (RENVELA ) 800 MG tablet Take 2,400 mg by mouth 3 (three) times daily with meals. Take 1 tablet by mouth a snack 05/17/23  Yes [provider]   Current Facility-Administered Medications  Medication Dose Route Frequency Provider Last Rate Last Admin   acetaminophen  (TYLENOL ) tablet 650 mg  650 mg Oral Q6H PRN Patel, Vishal R, MD       Or   acetaminophen  (TYLENOL ) suppository 650 mg  650 mg Rectal Q6H PRN Patel, Vishal R, MD       atorvastatin  (LIPITOR) tablet 40 mg  40 mg Oral Daily Patel, Vishal R, MD   40 mg at 05/14/24 1233   cinacalcet  (SENSIPAR ) tablet 60 mg  60 mg Oral Q breakfast Patel, Vishal R, MD       HYDROcodone -acetaminophen  (NORCO/VICODIN) 5-325 MG  per tablet 1-2 tablet  1-2 tablet Oral Q4H PRN Tobie Jorie SAUNDERS, MD       HYDROmorphone  (DILAUDID ) injection 0.5 mg  0.5 mg Intravenous Q3H PRN Tobie Jorie R, MD   0.5 mg at 05/14/24 0010   metoprolol  tartrate (LOPRESSOR ) tablet 12.5 mg  12.5 mg Oral BID Tobie Jorie R, MD   12.5 mg at 05/14/24 1233   midodrine  (PROAMATINE ) tablet 5 mg  5 mg Oral Q M,W,F-HD [place] Agent, [facility]       ondansetron  (ZOFRAN ) tablet 4 mg  4 mg Oral Q6H PRN Tobie Jorie SAUNDERS, MD       Or   ondansetron  (ZOFRAN ) injection 4 mg  4 mg Intravenous Q6H PRN Tobie Jorie SAUNDERS, MD   4 mg at 05/14/24 0440   senna-docusate (Senokot-S) tablet 1 tablet  1 tablet Oral QHS PRN Tobie Jorie SAUNDERS, MD       sevelamer  carbonate (RENVELA ) tablet 2,400 mg  2,400 mg Oral TID WC Tobie Jorie SAUNDERS, MD       Labs: Basic Metabolic Panel: Recent Labs  Lab 05/13/24 1848 05/13/24 1854 05/14/24 0437  NA 139 138 136  K 3.7 3.6 3.6  CL 93* 97* 92*  CO2 26  --  28  GLUCOSE 110* 111* 162*  BUN 42* 40* 45*  CREATININE  10.52* 11.50* 11.79*  CALCIUM  9.8  --  9.6   Liver Function Tests: Recent Labs  Lab 05/13/24 1848  AST 28  ALT 15  ALKPHOS 125  BILITOT 0.6  PROT 7.2  ALBUMIN  3.9   CBC: Recent Labs  Lab 05/13/24 1848 05/13/24 1854 05/14/24 0437  WBC 6.5  --  7.2  HGB 7.7* 8.2* 7.6*  HCT 24.3* 24.0* 24.1*  MCV 101.7*  --  102.1*  PLT 198  --  201   Studies/Results: CT Angio Abd/Pel W and/or Wo Contrast Result Date: 05/13/2024 EXAM: CTA ABDOMEN AND PELVIS WITHOUT AND WITH CONTRAST 05/13/2024 10:38:48 PM TECHNIQUE: CTA images of the abdomen and pelvis without and with intravenous contrast (100 mL iohexol  350 mg/mL injection). Three-dimensional MIP/volume rendered formations were performed. Automated exposure control, iterative reconstruction, and/or weight based adjustment of the mA/kV was utilized to reduce the radiation dose to as low as reasonably achievable. COMPARISON: CT abdomen and pelvis dated 05/13/2024. CLINICAL HISTORY: Hematuria. FINDINGS: VASCULATURE: AORTA: Aortic atherosclerotic calcification. No acute finding. No abdominal aortic aneurysm. No dissection. CELIAC TRUNK: No acute finding. No occlusion or significant stenosis. SUPERIOR MESENTERIC ARTERY: No acute finding. No occlusion or significant stenosis. RENAL ARTERIES: No acute finding. No occlusion or significant stenosis. ILIAC ARTERIES: No acute finding. No occlusion or significant stenosis. LIVER: Low density lesions in the liver, likely benign cysts. GALLBLADDER AND BILE DUCTS: Gallbladder is unremarkable. No biliary ductal dilatation. SPLEEN: The spleen is unremarkable. PANCREAS: The pancreas is unremarkable. ADRENAL GLANDS: Bilateral adrenal glands demonstrate no acute abnormality. KIDNEYS, URETERS AND BLADDER: Asymmetric right greater than left perinephric stranding. Bilateral cortical renal atrophy. Multicystic kidneys. Parenchymal calcification versus nonobstructing stones in both kidneys. Severe right and moderate left  hydronephrosis. Marked dilation of the right ureter. Hyperdensity in the both renal pelvises does not substantially change between phases. Hyperdensity in the right ureter also does not change between phases. This may represent sediment or clotted blood. No significant excretion of contrast from CT abdomen and pelvis early or today at 08:40 pm compatible with poor renal function. Irregular thick-walled bladder. The bladder is nondistended. Trace perivesical stranding. GI AND BOWEL: Stomach and duodenal sweep demonstrate no  acute abnormality. There is no bowel obstruction. No abnormal bowel wall thickening or distension. REPRODUCTIVE: Reproductive organs are unremarkable. PERITONEUM AND RETRPERITONEUM: No ascites or free air. LUNG BASE: No acute abnormality. LYMPH NODES: No lymphadenopathy. BONES AND SOFT TISSUES: Diffuse sclerosis in the vertebral bodies is compatible with renal osteodystrophy. No acute soft tissue abnormality. IMPRESSION: 1. Severe right and moderate left hydronephrosis with marked dilation of the right ureter. Hyperdensity in the both renal pelvises and right ureter unchanged between phases, possibly representing sediment or blood. Urothelial malignancy could appear similarly. Neurology consult recommended. 2. No significant excretion of contrast, compatible with poor renal function. 3. Irregular thick-walled bladder with trace perivesical stranding, which may reflect cystitis or nondistention. Electronically signed by: Norman Gatlin MD 05/13/2024 10:52 PM EDT RP Workstation: [id number]   CT ABDOMEN PELVIS W CONTRAST Result Date: 05/13/2024 CLINICAL DATA:  Hematuria, prior perinephric hematoma EXAM: CT ABDOMEN AND PELVIS WITH CONTRAST TECHNIQUE: Multidetector CT imaging of the abdomen and pelvis was performed using the standard protocol following bolus administration of intravenous contrast. RADIATION DOSE REDUCTION: This exam was performed according to the departmental dose-optimization  program which includes automated exposure control, adjustment of the mA and/or kV according to patient size and/or use of iterative reconstruction technique. CONTRAST:  OMNIPAQUE  IOHEXOL  350 MG/ML SOLN COMPARISON:  04/08/2024 FINDINGS: Lower chest: Chronic elevation of the left hemidiaphragm. Stable left lower lobe atelectasis. Trace pericardial effusion unchanged. Hepatobiliary: Stable hepatic hypodensities compatible with cysts. Gallbladder is unremarkable. No biliary duct dilation. Pancreas: Unremarkable. No pancreatic ductal dilatation or surrounding inflammatory changes. Spleen: Normal in size without focal abnormality. Adrenals/Urinary Tract: Stable appearance of the adrenal glands. Stable bilateral renal atrophy and cortical thinning. Numerous bilateral renal hypodensities are compatible with cysts. No specific follow-up recommended. There is stable moderate right-sided hydroureteronephrosis. There is high density material identified within the bilateral renal pelves, as well as within the mid right ureter, which may reflect sediment or blood products. Bilateral uroepithelial lesions are felt to be far less likely. Correlation with urinalysis is recommended. The bladder is decompressed, with nonspecific bladder wall thickening and perivesicular fat stranding. The appearance is stable since prior study. Stomach/Bowel: No bowel obstruction or ileus. Normal appendix right lower quadrant. Stable 1.2 cm lipoma within the gastric fundus and 2.2 cm lipoma within the sigmoid colon. No bowel wall thickening or inflammatory change. Vascular/Lymphatic: Aortic atherosclerosis. No enlarged abdominal or pelvic lymph nodes. Reproductive: Prostate is unremarkable. Other: No free fluid or free intraperitoneal gas. No abdominal wall hernia. Musculoskeletal: No acute or destructive bony abnormalities. Diffuse increased density throughout the visualized bony structures compatible with renal osteodystrophy. Reconstructed  images demonstrate no additional findings. IMPRESSION: 1. Severe bilateral hydroureteronephrosis, unchanged since prior exam. 2. High density material seen dependently within the bilateral renal pelves and mid right ureter, likely sediment or blood products. Correlation with urinalysis recommended. 3. Chronic bladder wall thickening and perivesicular fat stranding, nonspecific given decompressed state of the bladder. Underlying cystitis cannot be excluded. 4.  Aortic Atherosclerosis (ICD10-I70.0). Electronically Signed   By: Ozell Daring M.D.   On: 05/13/2024 21:02   DG Chest 1 View Result Date: 05/13/2024 EXAM: 1 VIEW(S) XRAY OF THE CHEST 05/13/2024 07:14:46 PM COMPARISON: 04/08/2024 CLINICAL HISTORY: Chest pain [id number]. Per chart - Pt BIB [facility] from home. Pt endorses blood from penis x1day. Passing large long clots are passing. On monitor 160-170 SVT, initially 180s. Refused adenosine  en route due to recently taking metoprolol  1 hour ago. On dialysis and doesn't make ; urine until yesterday  when he started feeling urge to urinate. FINDINGS: LUNGS AND PLEURA: Left basilar atelectasis or pneumonia. This is similar to prior. Elevated left hemidiaphragm. No pulmonary edema. No pleural effusion. No pneumothorax. HEART AND MEDIASTINUM: Cardiomegaly. BONES AND SOFT TISSUES: No acute osseous abnormality. IMPRESSION: 1. Left basilar atelectasis or pneumonia, similar to prior. Electronically signed by: Norman Gatlin MD 05/13/2024 07:21 PM EDT RP Workstation: [id number]   Dialysis Orders:  MWF - [facility] 4:30hr, 450/A1.5, EDW 136kg, 2K/2Ca bath, AVF, no heparin , UFP #4 - Mircera 150 q 2 weeks (last 9/22) - Calcitriol  2.54mcg PO q HD - Outpatient Hgb trends: 8.8 (9/9), 7.3 (9/17), 7.4 (9/24)  Assessment/Plan:  Gross hematuria with clots: Urology has been consulted. Symptoms chronic and sees [facility] as outpatient. Hgb low but stable and this is monitored weekly at dialysis and getting ESA.  ESRD:  Usual MWF  schedule - due for HD today, but no critical labs, will add on to schedule but will likely be done overnight.  Hx SVT: Continue BB for now.  BP/volume: BP high today, often drops with HD and uses midodrine  prn.  Anemia: See above, not due to ESA yet.  Metabolic bone disease: Ca ok, continue home meds.  T2DM  Izetta Boehringer, DEVONNA 05/14/2024, 2:58 PM  [employer]

## 2024-05-14 NOTE — ED Notes (Signed)
 Pt requested to hold medications until his breakfast tray arrived. Per kitchen staff, a second tray had to be ordered due to coffee spilling on the tray.

## 2024-05-14 NOTE — Progress Notes (Signed)
 Interventional Radiology Brief Note:  Devin Bullock is a 58 y.o. male with medical history significant for ESRD on MWF HD, prostate and bladder cancer s/p TURBT 2022, anemia of CKD, paroxysmal SVT, history of spontaneous right perinephric hematoma (June 2025) who presented to the ED for evaluation of hematuria with large blood clots.   IR was consulted overnight for possible intervention.  CT imaging obtained to include the abdomen and pelvis with contrast and shows mixed-density fluid in the bilateral renal pelvises without active extravasation.  Reviewed case with Dr. Philip this AM.  No target for IR embolization identified at this time.  Recommend UROLOGY consultation.   Primary team made aware.   [name], MS RD PA-C 10:12 AM

## 2024-05-14 NOTE — Assessment & Plan Note (Signed)
 Patient follows Monday, Wednesday and Friday dialysis schedule. - Nephrology is consulted -Continue with routine dialysis -Continue with home midodrine  on dialysis days

## 2024-05-14 NOTE — Plan of Care (Signed)

## 2024-05-14 NOTE — Progress Notes (Signed)
 Update on dialysis -> will roll to tomorrow AM (9/30).  Izetta Boehringer, PA-C [employer] Pager [phone]

## 2024-05-14 NOTE — ED Notes (Signed)
 Complete bed change. Sheets and bedding was saturated during the procedure. Paper scrubs and warm blankets given to pt.

## 2024-05-15 ENCOUNTER — Encounter (HOSPITAL_COMMUNITY): Payer: Self-pay | Admitting: *Deleted

## 2024-05-15 ENCOUNTER — Encounter (HOSPITAL_COMMUNITY): Payer: Self-pay | Admitting: Internal Medicine

## 2024-05-15 DIAGNOSIS — I10 Essential (primary) hypertension: Secondary | ICD-10-CM

## 2024-05-15 DIAGNOSIS — N186 End stage renal disease: Secondary | ICD-10-CM | POA: Diagnosis not present

## 2024-05-15 DIAGNOSIS — I5020 Unspecified systolic (congestive) heart failure: Secondary | ICD-10-CM

## 2024-05-15 DIAGNOSIS — R310 Gross hematuria: Secondary | ICD-10-CM | POA: Diagnosis not present

## 2024-05-15 DIAGNOSIS — C678 Malignant neoplasm of overlapping sites of bladder: Secondary | ICD-10-CM | POA: Diagnosis not present

## 2024-05-15 LAB — RENAL FUNCTION PANEL
Albumin: 3.3 g/dL — ABNORMAL LOW (ref 3.5–5.0)
Albumin: 3.5 g/dL (ref 3.5–5.0)
Anion gap: 17 — ABNORMAL HIGH (ref 5–15)
Anion gap: 14 (ref 5–15)
BUN: 58 mg/dL — ABNORMAL HIGH (ref 6–20)
BUN: 25 mg/dL — ABNORMAL HIGH (ref 6–20)
CO2: 27 mmol/L (ref 22–32)
CO2: 27 mmol/L (ref 22–32)
Calcium: 9.2 mg/dL (ref 8.9–10.3)
Calcium: 9.3 mg/dL (ref 8.9–10.3)
Chloride: 90 mmol/L — ABNORMAL LOW (ref 98–111)
Chloride: 94 mmol/L — ABNORMAL LOW (ref 98–111)
Creatinine, Ser: 13.26 mg/dL — ABNORMAL HIGH (ref 0.61–1.24)
Creatinine, Ser: 7.19 mg/dL — ABNORMAL HIGH (ref 0.61–1.24)
GFR, Estimated: 4 mL/min — ABNORMAL LOW (ref >60)
GFR, Estimated: 8 mL/min — ABNORMAL LOW (ref >60)
Glucose, Bld: 129 mg/dL — ABNORMAL HIGH (ref 70–99)
Glucose, Bld: 116 mg/dL — ABNORMAL HIGH (ref 70–99)
Phosphorus: 6.1 mg/dL — ABNORMAL HIGH (ref 2.5–4.6)
Phosphorus: 3.6 mg/dL (ref 2.5–4.6)
Potassium: 4 mmol/L (ref 3.5–5.1)
Potassium: 3.8 mmol/L (ref 3.5–5.1)
Sodium: 134 mmol/L — ABNORMAL LOW (ref 135–145)
Sodium: 135 mmol/L (ref 135–145)

## 2024-05-15 LAB — CBC
HCT: 22.5 % — ABNORMAL LOW (ref 39.0–52.0)
Hemoglobin: 7.2 g/dL — ABNORMAL LOW (ref 13.0–17.0)
MCH: 31.3 pg (ref 26.0–34.0)
MCHC: 32 g/dL (ref 30.0–36.0)
MCV: 97.8 fL (ref 80.0–100.0)
Platelets: 164 K/uL (ref 150–400)
RBC: 2.3 MIL/uL — ABNORMAL LOW (ref 4.22–5.81)
RDW: 19.9 % — ABNORMAL HIGH (ref 11.5–15.5)
WBC: 8.2 K/uL (ref 4.0–10.5)
nRBC: 0.2 % (ref 0.0–0.2)

## 2024-05-15 LAB — HEMOGLOBIN AND HEMATOCRIT, BLOOD
HCT: 24.3 % — ABNORMAL LOW (ref 39.0–52.0)
Hemoglobin: 7.8 g/dL — ABNORMAL LOW (ref 13.0–17.0)

## 2024-05-15 LAB — HEPATITIS B SURFACE ANTIBODY, QUANTITATIVE: Hep B S AB Quant (Post): 2211 m[IU]/mL

## 2024-05-15 MED ORDER — ACETAMINOPHEN 650 MG RE SUPP: 650.0000 mg | Freq: Four times a day (QID) | RECTAL | Status: DC | PRN

## 2024-05-15 MED ORDER — SODIUM CHLORIDE 0.9 % IV SOLN
1.0000 g | Freq: Every day | INTRAVENOUS | Status: DC
  Administered 2024-05-15 – 2024-05-20 (×5): 1 g via INTRAVENOUS
  Filled 2024-05-15 (×7): qty 10

## 2024-05-15 MED ORDER — ACETAMINOPHEN 500 MG PO TABS
1000.0000 mg | ORAL_TABLET | Freq: Four times a day (QID) | ORAL | Status: DC | PRN
  Filled 2024-05-15: qty 2

## 2024-05-15 MED ORDER — CHLORHEXIDINE GLUCONATE CLOTH 2 % EX PADS
6.0000 | MEDICATED_PAD | Freq: Every day | CUTANEOUS | Status: DC
Start: 2024-05-16 — End: 2024-05-20
  Administered 2024-05-16 – 2024-05-18 (×3): 6 via TOPICAL

## 2024-05-15 NOTE — Plan of Care (Signed)

## 2024-05-15 NOTE — Progress Notes (Signed)
 [facility] Progress Note  Subjective:  Seen in HD unit Lots of questions about not wanting to go home too soon, excessive bladder bleeding / clots  Vitals:   05/15/24 1130 05/15/24 1200 05/15/24 1205 05/15/24 1228  BP: 108/70 (!) 147/74 125/78 (!) 117/92  Pulse: 98 99 (!) 103 (!) 104  Resp: (!) 25 20 (!) 24   Temp:   98.1 F (36.7 C)   TempSrc:      SpO2: 97% 97% 97% 100%  Weight:   (!) 140.4 kg   Height:        Exam:  alert, nad   no jvd  Chest cta bilat  Cor reg no RG  Abd soft ntnd no ascites   Ext no LE edema   Alert, NF, ox3  L AVF +bruit   Home bp meds: Lopressor  12.5 bid Midodrine  5mg  pre hd mwf   OP HD: MWF [facility] 4.5h   B450   136kg   2K bath   AVF  Heparin  none - Mircera 150 q 2 weeks (last 9/22) - Calcitriol  2.50mcg PO q HD - Outpatient Hgb trends: 8.8 (9/9), 7.3 (9/17), 7.4 (9/24)   Assessment/Plan:  Gross hematuria with clots: Urology has been consulted. Symptoms chronic and sees [facility] as outpatient. Hgb low but stable and this is monitored weekly at dialysis and getting ESA. Urology consulted and did bedside cystoscopy 9/29; they were considering placing 3-way coude for irrigation but pt didn't tolerate the procedure, and although the lighting was poor, they did not see any clots in the bladder. Per urology/ pmd.   ESRD:  Usual MWF schedule. HD rolled over to this morning. Next HD tomorrow.   Hx SVT: Continue BB for now.  BP/volume: BP high today, often drops with HD, uses midodrine  prn.  Anemia: Hb 7- 8 here. Not due for ESA yet.  Metabolic bone disease: Ca ok, continue home meds.   Myer Fret MD  [facility] 05/15/2024, 1:14 PM  Recent Labs  Lab 05/13/24 1848 05/13/24 1854 05/14/24 0437 05/14/24 1433 05/14/24 2100 05/15/24 0825  HGB 7.7*   < > 7.6*   < > 6.7* 7.2*  ALBUMIN  3.9  --   --   --   --  3.3*  CALCIUM  9.8  --  9.6  --   --  9.2  PHOS  --   --   --   --   --  6.1*  CREATININE 10.52*   < > 11.79*  --   --  13.26*  K  3.7   < > 3.6  --   --  4.0   < > = values in this interval not displayed.   No results for input(s): IRON, TIBC, FERRITIN in the last 168 hours. Inpatient medications:  atorvastatin   40 mg Oral Daily   Chlorhexidine  Gluconate Cloth  6 each Topical Q0600   cinacalcet   60 mg Oral Q breakfast   metoprolol  tartrate  12.5 mg Oral BID   midodrine   5 mg Oral Q M,W,F-HD   sevelamer  carbonate  2,400 mg Oral TID WC    cefTRIAXone (ROCEPHIN)  IV     acetaminophen  **OR** acetaminophen , HYDROcodone -acetaminophen , HYDROmorphone  (DILAUDID ) injection, ondansetron  **OR** ondansetron  (ZOFRAN ) IV, senna-docusate

## 2024-05-15 NOTE — Assessment & Plan Note (Signed)
 05/15/24  Continue with home atorvastatin 

## 2024-05-15 NOTE — Plan of Care (Signed)
  Problem: Education: Goal: Knowledge of General Education information will improve Description: Including pain rating scale, medication(s)/side effects and non-pharmacologic comfort measures Outcome: Progressing   Problem: Health Behavior/Discharge Planning: Goal: Ability to manage health-related needs will improve Outcome: Progressing   Problem: Clinical Measurements: Goal: Will remain free from infection Outcome: Progressing Goal: Respiratory complications will improve Outcome: Progressing   Problem: Activity: Goal: Risk for activity intolerance will decrease Outcome: Progressing   Problem: Nutrition: Goal: Adequate nutrition will be maintained Outcome: Progressing   Problem: Coping: Goal: Level of anxiety will decrease Outcome: Progressing   Problem: Elimination: Goal: Will not experience complications related to bowel motility Outcome: Progressing Goal: Will not experience complications related to urinary retention Outcome: Progressing   Problem: Pain Managment: Goal: General experience of comfort will improve and/or be controlled Outcome: Progressing   Problem: Safety: Goal: Ability to remain free from injury will improve Outcome: Progressing   Problem: Skin Integrity: Goal: Risk for impaired skin integrity will decrease Outcome: Progressing

## 2024-05-15 NOTE — Progress Notes (Signed)
 PROGRESS NOTE    Devin Bullock  FMW:[id number] DOB: 07/09/1966 DOA: 05/13/2024 PCP: [facility]  Subjective: Pt seen and examined. Pt is very bitter about his ongoing hematuria. Pt has been seeing [facility] since October 2022 and has had all of his bladder cancer care with [facility].  Was last seen by Dr. Nicholaus with [facility] on 04-20-2024 and had in-office cytoscopy. Pt has had recurrent of his bladder tumor and is scheduled for resection on 06-20-2024. Pt has had ongoing hematuria for at least 2 months now.  Pt makes it seem like his hematuria is new, which it is not.  He received 1 unit PRBC transfusion yesterday.  He was unable to tolerate hematuria catheter insertion yesterday by urology PA.  Discussed with him that he has chosen to split his healthcare between two different healthcare facilities([facility] and [facility]). Discussed with him that if he chooses to switch his urologic care over to [facility], then that referral could be made. However, if he establishes his urologic care with [facility], his future bladder tumor resection with [facility] may be canceled. Pt is bitter that his surgery is June 20, 2024 and cannot be sooner. It appears that Dr. Nicholaus is not available to operate on him and got one of his partners who has an available date to operate on him on 06-20-2024. Discussed with patient that this may be the earliest that [facility] could accommodate him.  Discussed that if he chooses to switch to [facility], then they could possibly operate on him but it may be even later than June 20, 2024.  Pt states he is still urinating blood.   Hospital Course: CC: blood clots from penis HPI: Devin Bullock is a 58 y.o. male with medical history significant for ESRD on MWF HD, prostate and bladder cancer s/p TURBT 2022, anemia of CKD, paroxysmal SVT, history of spontaneous right perinephric hematoma (June 2025) who presented to the ED for evaluation of  hematuria with large blood clots.   Patient reports 2 days ago that he started having suprapubic and urethral pain.  He started passing along dark red blood clots.  He also was having right flank pain.  He says normally he does not make any urine.  He showed me a video showing a large amount of long stringy blood clots that he has seen coming out over the last 2 days.  He does not take any blood thinners.   Patient was previously admitted in June for acute blood loss anemia due to spontaneous right perinephric requiring 4 unit PRBC transfusions.  He was monitored closely and did not require any other specific intervention.  Patient's primary nephrology and neurology teams are with [facility] in [place].  He was previously receiving BCG treatments which were held due to ongoing urethral bleeding.  He underwent cystoscopy 04/19/2024 which showed a tumor in the bladder.  He is scheduled for TURBT on 06/21/2024.  CT abdomen/pelvis with contrast showed severe bilateral hydroureteronephrosis, unchanged from prior exam. High density material seen dependently within the bilateral renal pelves and mid right ureter, likely sediment or blood products. Chronic bladder wall thickening and perivesicular fat stranding.   Patient was given IV fentanyl  25 mcg. EDP discussed with urology resident Dr. Mitchell and IR Dr. Hughes. They recommended CT angio abdomen/pelvis. If evidence of active extravasation IR will consider embolization in the morning.    ED Course  Labs/Imaging on admission: I have personally reviewed following labs and imaging  studies.   Initial vitals showed BP 114/83, pulse 158, RR 24, temp 98.1 F, SpO2 98% on room air.  Initial heart rhythm was SVT and since converted to sinus rhythm rate 80s-90s.   Labs showed WBC 6.5, hemoglobin 7.7, platelets 198, sodium 139, potassium 3.7, bicarb 26, BUN 42, creatinine 10.52, serum glucose 110, LFTs within normal limits, BNP 62.5, troponin 47 > 52, lactic  acid 2.3.   CT abdomen/pelvis with contrast showed severe bilateral hydroureteronephrosis, unchanged from prior exam.  High density material seen dependently within the bilateral renal pelves and mid right ureter, likely sediment or blood products.  Chronic bladder wall thickening and perivesicular fat stranding.   Patient was given IV fentanyl  25 mcg.  EDP discussed with urology resident Dr. Mitchell and IR Dr. Hughes.  They recommended CT angio abdomen/pelvis.  If evidence of active extravasation IR will consider embolization in the morning.  Both teams will consult tomorrow.  The hospitalist service was consulted for admission.  Significant Events: Admitted 05/13/2024 for gross hematuria 9/29: Vital stable, hemoglobin 7.6, CTA abdomen and pelvis with no active bleeding, rest of the findings similar to the above CT. urology did a bedside cystoscopy, no active clotting or bleeding in bladder, string-like clots likely ureteral, no obvious source of bleeding found at this time.  Unable to tolerate Foley placement or irrigation.  Urology is recommending close monitoring of hemoglobin and if remains stable can be discharged tomorrow with a close outpatient follow-up.  Going for HD today  Admission Labs: Lactic acid 2.3 WBC 6.5, HgB 7.7, plt 198 BNP 62.5 Na 139, K 3.7, CO2 of 26, BUN 42, Scr 10.52, glu 110 T. Prot 7.2, alb 3.9, AST 28, ALT 15, alk phos 125, t. Bili 0.6  Admission Imaging Studies: CXR Left basilar atelectasis or pneumonia, similar to prior.  CT abd/pelvis Severe bilateral hydroureteronephrosis, unchanged since prior exam. 2. High density material seen dependently within the bilateral renal pelves and mid right ureter, likely sediment or blood products. Correlation with urinalysis recommended. 3. Chronic bladder wall thickening and perivesicular fat stranding, nonspecific given decompressed state of the bladder. Underlying cystitis cannot be excluded. 4.  Aortic Atherosclerosis  CTA  abd/pelvis Severe right and moderate left hydronephrosis with marked dilation of the right ureter. Hyperdensity in the both renal pelvises and right ureter unchanged between phases, possibly representing sediment or blood. Urothelial malignancy could appear similarly. Neurology consult recommended. 2. No significant excretion of contrast, compatible with poor renal function. 3. Irregular thick-walled bladder with trace perivesical stranding, which may reflect cystitis or nondistention.  Significant Labs:   Significant Imaging Studies:   Antibiotic Therapy: Anti-infectives (From admission, onward)    None       Procedures: Bedside cystoscopy PRBC transfusion x 1 unit  Consultants: IR urology    Assessment and Plan: * Gross hematuria 05-14-2024 Prostate/bladder cancer, History of spontaneous right perinephric hematoma June 2025: CTA and cystoscopy unable to locate the source of bleeding, concern of ureteral source based on clot morphology and a clear bladder on cystoscopy. Patient is high risk due to history of bladder cancer. Unable to tolerate Foley placement and manual irrigation. - Urology is on board -Monitor CBC  05/15/24 Pt is very bitter about his ongoing hematuria. Pt has been seeing [facility] since October 2022 and has had all of his bladder cancer care with [facility].  Was last seen by Dr. Nicholaus with [facility] on 04-20-2024 and had in-office cytoscopy. Pt has had recurrent of his bladder tumor and  is scheduled for resection on 06-20-2024. Pt has had ongoing hematuria for at least 2 months now.  Pt makes it seem like his hematuria is new, which it is not.  He received 1 unit PRBC transfusion yesterday.   He was unable to tolerate hematuria catheter insertion yesterday by urology PA.   Discussed with him that he has chosen to split his healthcare between two different healthcare facilities([facility] and [facility]). Discussed with him that if he chooses to switch his urologic  care over to [facility], then that referral could be made. However, if he establishes his urologic care with [facility], his future bladder tumor resection with [facility] may be canceled. Pt is bitter that his surgery is June 20, 2024 and cannot be sooner. It appears that Dr. Nicholaus is not available to operate on him and got one of his partners who has an available date to operate on him on 06-20-2024. Discussed with patient that this may be the earliest that [facility] could accommodate him.   Discussed that if he chooses to switch to [facility], then they could possibly operate on him but it may be even later than June 20, 2024.   Pt states he is still urinating blood.   Bladder cancer (HCC) 05/15/24 appears he has a recurrence of his bladder cancer. He has been managed by [facility] since October 2023 by [facility] for his bladder cancer. Pt to f/u with [facility]   Hyperlipidemia 05/15/24  Continue with home atorvastatin   Paroxysmal SVT (supraventricular tachycardia) 05-14-2024 Had 1 episode on arrival which resolved after taking home dose of metoprolol . - Continue to monitor -Continue with metoprolol   05/15/24 stable. On NSR. Continue lopressor .   HFrEF (heart failure with reduced ejection fraction) (HCC) 05/15/24 intravascular volume managed by HD. Continue with lopressor    Essential hypertension 05/15/24 continue lopressor  12.5 mg bid.   ESRD (end stage renal disease) on dialysis [facility]) 05-14-2024 Patient follows Monday, Wednesday and Friday dialysis schedule. - Nephrology is consulted -Continue with routine dialysis -Continue with home midodrine  on dialysis days  05/15/24 had HD today. Nephrology following   Anemia of chronic disease 05-14-2024 Hemoglobin at 7.6 this morning. - Monitor hemoglobin closely-Q8 hourly ordered -Transfuse if below 7  05/15/24  pt received 1 unit PRBC yesterday. Post-transfusion HgB 7.8   Obesity, Class II, BMI  35-39.9 Body mass index is 37.68 kg/m.   DVT prophylaxis: SCDs Start: 05/13/24 2350    Code Status: Full Code Family Communication: no family at bedside. He is decisional. Disposition Plan: return home Reason for continuing need for hospitalization: stable. Can go home tomorrow if HgB is stable.  Objective: Vitals:   05/15/24 1130 05/15/24 1200 05/15/24 1205 05/15/24 1228  BP: 108/70 (!) 147/74 125/78 (!) 117/92  Pulse: 98 99 (!) 103 (!) 104  Resp: (!) 25 20 (!) 24   Temp:   98.1 F (36.7 C)   TempSrc:      SpO2: 97% 97% 97% 100%  Weight:   (!) 140.4 kg   Height:        Intake/Output Summary (Last 24 hours) at 05/15/2024 1655 Last data filed at 05/15/2024 1205 Gross per 24 hour  Intake 360 ml  Output 2600 ml  Net -2240 ml   Filed Weights   05/13/24 1838 05/15/24 0815 05/15/24 1205  Weight: 136.1 kg (!) 142.8 kg (!) 140.4 kg    Examination:  Physical Exam Vitals and nursing note reviewed.  HENT:     Head: Normocephalic and atraumatic.  Cardiovascular:     Rate and Rhythm: Normal rate and regular rhythm.  Pulmonary:     Effort: Pulmonary effort is normal.     Breath sounds: Normal breath sounds.  Abdominal:     General: Abdomen is protuberant. Bowel sounds are normal. There is no distension.     Palpations: Abdomen is soft.  Musculoskeletal:     Comments: Left forearm AVG  Skin:    Capillary Refill: Capillary refill takes less than 2 seconds.  Neurological:     Mental Status: He is alert and oriented to person, place, and time.     Data Reviewed: I have personally reviewed following labs and imaging studies  CBC: Recent Labs  Lab 05/13/24 1848 05/13/24 1854 05/14/24 0437 05/14/24 1433 05/14/24 2100 05/15/24 0825 05/15/24 1310  WBC 6.5  --  7.2  --   --  8.2  --   HGB 7.7*   < > 7.6* 7.3* 6.7* 7.2* 7.8*  HCT 24.3*   < > 24.1* 23.7* 21.1* 22.5* 24.3*  MCV 101.7*  --  102.1*  --   --  97.8  --   PLT 198  --  201  --   --  164  --    < > = values  in this interval not displayed.   Basic Metabolic Panel: Recent Labs  Lab 05/13/24 1848 05/13/24 1854 05/14/24 0437 05/15/24 0825 05/15/24 1310  NA 139 138 136 134* 135  K 3.7 3.6 3.6 4.0 3.8  CL 93* 97* 92* 90* 94*  CO2 26  --  28 27 27   GLUCOSE 110* 111* 162* 129* 116*  BUN 42* 40* 45* 58* 25*  CREATININE 10.52* 11.50* 11.79* 13.26* 7.19*  CALCIUM  9.8  --  9.6 9.2 9.3  PHOS  --   --   --  6.1* 3.6   GFR: Estimated Creatinine Clearance: 16.7 mL/min (A) (by C-G formula based on SCr of 7.19 mg/dL (H)). Liver Function Tests: Recent Labs  Lab 05/13/24 1848 05/15/24 0825 05/15/24 1310  AST 28  --   --   ALT 15  --   --   ALKPHOS 125  --   --   BILITOT 0.6  --   --   PROT 7.2  --   --   ALBUMIN  3.9 3.3* 3.5   Coagulation Profile: Recent Labs  Lab 05/13/24 2205  INR 1.2   BNP (last 3 results) Recent Labs    05/21/23 1321 05/13/24 1849  BNP 147.1* 62.5   Sepsis Labs: Recent Labs  Lab 05/13/24 1854  LATICACIDVEN 2.3*   Radiology Studies: CT Angio Abd/Pel W and/or Wo Contrast Result Date: 05/13/2024 EXAM: CTA ABDOMEN AND PELVIS WITHOUT AND WITH CONTRAST 05/13/2024 10:38:48 PM TECHNIQUE: CTA images of the abdomen and pelvis without and with intravenous contrast (100 mL iohexol  350 mg/mL injection). Three-dimensional MIP/volume rendered formations were performed. Automated exposure control, iterative reconstruction, and/or weight based adjustment of the mA/kV was utilized to reduce the radiation dose to as low as reasonably achievable. COMPARISON: CT abdomen and pelvis dated 05/13/2024. CLINICAL HISTORY: Hematuria. FINDINGS: VASCULATURE: AORTA: Aortic atherosclerotic calcification. No acute finding. No abdominal aortic aneurysm. No dissection. CELIAC TRUNK: No acute finding. No occlusion or significant stenosis. SUPERIOR MESENTERIC ARTERY: No acute finding. No occlusion or significant stenosis. RENAL ARTERIES: No acute finding. No occlusion or significant stenosis. ILIAC  ARTERIES: No acute finding. No occlusion or significant stenosis. LIVER: Low density lesions in the liver, likely benign cysts. GALLBLADDER AND BILE DUCTS: Gallbladder  is unremarkable. No biliary ductal dilatation. SPLEEN: The spleen is unremarkable. PANCREAS: The pancreas is unremarkable. ADRENAL GLANDS: Bilateral adrenal glands demonstrate no acute abnormality. KIDNEYS, URETERS AND BLADDER: Asymmetric right greater than left perinephric stranding. Bilateral cortical renal atrophy. Multicystic kidneys. Parenchymal calcification versus nonobstructing stones in both kidneys. Severe right and moderate left hydronephrosis. Marked dilation of the right ureter. Hyperdensity in the both renal pelvises does not substantially change between phases. Hyperdensity in the right ureter also does not change between phases. This may represent sediment or clotted blood. No significant excretion of contrast from CT abdomen and pelvis early or today at 08:40 pm compatible with poor renal function. Irregular thick-walled bladder. The bladder is nondistended. Trace perivesical stranding. GI AND BOWEL: Stomach and duodenal sweep demonstrate no acute abnormality. There is no bowel obstruction. No abnormal bowel wall thickening or distension. REPRODUCTIVE: Reproductive organs are unremarkable. PERITONEUM AND RETRPERITONEUM: No ascites or free air. LUNG BASE: No acute abnormality. LYMPH NODES: No lymphadenopathy. BONES AND SOFT TISSUES: Diffuse sclerosis in the vertebral bodies is compatible with renal osteodystrophy. No acute soft tissue abnormality. IMPRESSION: 1. Severe right and moderate left hydronephrosis with marked dilation of the right ureter. Hyperdensity in the both renal pelvises and right ureter unchanged between phases, possibly representing sediment or blood. Urothelial malignancy could appear similarly. Neurology consult recommended. 2. No significant excretion of contrast, compatible with poor renal function. 3. Irregular  thick-walled bladder with trace perivesical stranding, which may reflect cystitis or nondistention. Electronically signed by: Norman Gatlin MD 05/13/2024 10:52 PM EDT RP Workstation: [id number]   CT ABDOMEN PELVIS W CONTRAST Result Date: 05/13/2024 CLINICAL DATA:  Hematuria, prior perinephric hematoma EXAM: CT ABDOMEN AND PELVIS WITH CONTRAST TECHNIQUE: Multidetector CT imaging of the abdomen and pelvis was performed using the standard protocol following bolus administration of intravenous contrast. RADIATION DOSE REDUCTION: This exam was performed according to the departmental dose-optimization program which includes automated exposure control, adjustment of the mA and/or kV according to patient size and/or use of iterative reconstruction technique. CONTRAST:  OMNIPAQUE  IOHEXOL  350 MG/ML SOLN COMPARISON:  04/08/2024 FINDINGS: Lower chest: Chronic elevation of the left hemidiaphragm. Stable left lower lobe atelectasis. Trace pericardial effusion unchanged. Hepatobiliary: Stable hepatic hypodensities compatible with cysts. Gallbladder is unremarkable. No biliary duct dilation. Pancreas: Unremarkable. No pancreatic ductal dilatation or surrounding inflammatory changes. Spleen: Normal in size without focal abnormality. Adrenals/Urinary Tract: Stable appearance of the adrenal glands. Stable bilateral renal atrophy and cortical thinning. Numerous bilateral renal hypodensities are compatible with cysts. No specific follow-up recommended. There is stable moderate right-sided hydroureteronephrosis. There is high density material identified within the bilateral renal pelves, as well as within the mid right ureter, which may reflect sediment or blood products. Bilateral uroepithelial lesions are felt to be far less likely. Correlation with urinalysis is recommended. The bladder is decompressed, with nonspecific bladder wall thickening and perivesicular fat stranding. The appearance is stable since prior study.  Stomach/Bowel: No bowel obstruction or ileus. Normal appendix right lower quadrant. Stable 1.2 cm lipoma within the gastric fundus and 2.2 cm lipoma within the sigmoid colon. No bowel wall thickening or inflammatory change. Vascular/Lymphatic: Aortic atherosclerosis. No enlarged abdominal or pelvic lymph nodes. Reproductive: Prostate is unremarkable. Other: No free fluid or free intraperitoneal gas. No abdominal wall hernia. Musculoskeletal: No acute or destructive bony abnormalities. Diffuse increased density throughout the visualized bony structures compatible with renal osteodystrophy. Reconstructed images demonstrate no additional findings. IMPRESSION: 1. Severe bilateral hydroureteronephrosis, unchanged since prior exam. 2. High density material seen  dependently within the bilateral renal pelves and mid right ureter, likely sediment or blood products. Correlation with urinalysis recommended. 3. Chronic bladder wall thickening and perivesicular fat stranding, nonspecific given decompressed state of the bladder. Underlying cystitis cannot be excluded. 4.  Aortic Atherosclerosis (ICD10-I70.0). Electronically Signed   By: Ozell Daring M.D.   On: 05/13/2024 21:02   DG Chest 1 View Result Date: 05/13/2024 EXAM: 1 VIEW(S) XRAY OF THE CHEST 05/13/2024 07:14:46 PM COMPARISON: 04/08/2024 CLINICAL HISTORY: Chest pain [id number]. Per chart - Pt BIB [facility] from home. Pt endorses blood from penis x1day. Passing large long clots are passing. On monitor 160-170 SVT, initially 180s. Refused adenosine  en route due to recently taking metoprolol  1 hour ago. On dialysis and doesn't make ; urine until yesterday when he started feeling urge to urinate. FINDINGS: LUNGS AND PLEURA: Left basilar atelectasis or pneumonia. This is similar to prior. Elevated left hemidiaphragm. No pulmonary edema. No pleural effusion. No pneumothorax. HEART AND MEDIASTINUM: Cardiomegaly. BONES AND SOFT TISSUES: No acute osseous abnormality. IMPRESSION: 1.  Left basilar atelectasis or pneumonia, similar to prior. Electronically signed by: Norman Gatlin MD 05/13/2024 07:21 PM EDT RP Workstation: [id number]    Scheduled Meds:  atorvastatin   40 mg Oral Daily   [START ON 05/16/2024] Chlorhexidine  Gluconate Cloth  6 each Topical Q0600   cinacalcet   60 mg Oral Q breakfast   metoprolol  tartrate  12.5 mg Oral BID   midodrine   5 mg Oral Q M,W,F-HD   sevelamer  carbonate  2,400 mg Oral TID WC   Continuous Infusions:  cefTRIAXone (ROCEPHIN)  IV 1 g (05/15/24 1514)     LOS: 1 day   Time spent: 60 minutes  Camellia Door, DO  [facility]  05/15/2024, 4:55 PM

## 2024-05-15 NOTE — Progress Notes (Signed)
   05/15/24 1205  Vitals  Temp 98.1 F (36.7 C)  Pulse Rate (!) 103  Resp (!) 24  BP 125/78  SpO2 97 %  Weight (!) 140.4 kg  Type of Weight Post-Dialysis  Post Treatment  Dialyzer Clearance Lightly streaked  Hemodialysis Intake (mL) 0 mL  Liters Processed 84  Fluid Removed (mL) 2600 mL  Tolerated HD Treatment Yes  AVG/AVF Arterial Site Held (minutes) 10 minutes  AVG/AVF Venous Site Held (minutes) 10 minutes   Received patient in bed to unit.  Alert and oriented.  Informed consent signed and in chart.   TX duration:3.5hrs  Patient tolerated well.  Transported back to the room  Alert, without acute distress.  Hand-off given to patient's nurse.   Access used: LAVF Access issues: NONE  Total UF removed: 2.6L Medication(s) given: NONE   [facility]

## 2024-05-15 NOTE — Assessment & Plan Note (Signed)
 05-14-2024 Hemoglobin at 7.6 this morning. - Monitor hemoglobin closely-Q8 hourly ordered -Transfuse if below 7  05/15/24  pt received 1 unit PRBC yesterday. Post-transfusion HgB 7.8

## 2024-05-15 NOTE — Assessment & Plan Note (Signed)
 05/15/24 intravascular volume managed by HD. Continue with lopressor 

## 2024-05-15 NOTE — Assessment & Plan Note (Signed)
 05/15/24 continue lopressor  12.5 mg bid.

## 2024-05-15 NOTE — Assessment & Plan Note (Signed)
Body mass index is 37.68 kg/m.

## 2024-05-15 NOTE — Assessment & Plan Note (Signed)
 05-14-2024 Patient follows Monday, Wednesday and Friday dialysis schedule. - Nephrology is consulted -Continue with routine dialysis -Continue with home midodrine  on dialysis days  05/15/24 had HD today. Nephrology following

## 2024-05-15 NOTE — Assessment & Plan Note (Signed)
 05/15/24 appears he has a recurrence of his bladder cancer. He has been managed by [facility] since October 2023 by [facility] for his bladder cancer. Pt to f/u with [facility]

## 2024-05-15 NOTE — Subjective & Objective (Signed)
 Pt seen and examined. Pt is very bitter about his ongoing hematuria. Pt has been seeing [facility] since October 2022 and has had all of his bladder cancer care with [facility].  Was last seen by Dr. Nicholaus with [facility] on 04-20-2024 and had in-office cytoscopy. Pt has had recurrent of his bladder tumor and is scheduled for resection on 06-20-2024. Pt has had ongoing hematuria for at least 2 months now.  Pt makes it seem like his hematuria is new, which it is not.  He received 1 unit PRBC transfusion yesterday.  He was unable to tolerate hematuria catheter insertion yesterday by urology PA.  Discussed with him that he has chosen to split his healthcare between two different healthcare facilities([facility] and [facility]). Discussed with him that if he chooses to switch his urologic care over to [facility], then that referral could be made. However, if he establishes his urologic care with [facility], his future bladder tumor resection with [facility] may be canceled. Pt is bitter that his surgery is June 20, 2024 and cannot be sooner. It appears that Dr. Nicholaus is not available to operate on him and got one of his partners who has an available date to operate on him on 06-20-2024. Discussed with patient that this may be the earliest that [facility] could accommodate him.  Discussed that if he chooses to switch to [facility], then they could possibly operate on him but it may be even later than June 20, 2024.  Pt states he is still urinating blood.

## 2024-05-15 NOTE — Progress Notes (Signed)
 Pt receives out-pt HD at [facility] clinic, MWF, 1055 chair time. Will continue to assist as needed.   Gabby Rackers [employer] navigator [phone]

## 2024-05-15 NOTE — Progress Notes (Signed)
   05/14/24 2300  Provider Notification  Provider Name/Title Franky  Date Provider Notified 05/14/24  Time Provider Notified 2310  Method of Notification Page  Notification Reason Critical Result (Hemoglobin 6.7)  Date Critical Result Received 05/14/24  Time Critical Result Received 2200  Provider response See new orders  Date of Provider Response 05/14/24  Time of Provider Response 2336    MD ordered 1 unit RBC to be transfused.

## 2024-05-15 NOTE — Assessment & Plan Note (Signed)
 05-14-2024 Prostate/bladder cancer, History of spontaneous right perinephric hematoma June 2025: CTA and cystoscopy unable to locate the source of bleeding, concern of ureteral source based on clot morphology and a clear bladder on cystoscopy. Patient is high risk due to history of bladder cancer. Unable to tolerate Foley placement and manual irrigation. - Urology is on board -Monitor CBC  05/15/24 Pt is very bitter about his ongoing hematuria. Pt has been seeing [facility] since October 2022 and has had all of his bladder cancer care with [facility].  Was last seen by Dr. Nicholaus with [facility] on 04-20-2024 and had in-office cytoscopy. Pt has had recurrent of his bladder tumor and is scheduled for resection on 06-20-2024. Pt has had ongoing hematuria for at least 2 months now.  Pt makes it seem like his hematuria is new, which it is not.  He received 1 unit PRBC transfusion yesterday.   He was unable to tolerate hematuria catheter insertion yesterday by urology PA.   Discussed with him that he has chosen to split his healthcare between two different healthcare facilities([facility] and [facility]). Discussed with him that if he chooses to switch his urologic care over to [facility], then that referral could be made. However, if he establishes his urologic care with [facility], his future bladder tumor resection with [facility] may be canceled. Pt is bitter that his surgery is June 20, 2024 and cannot be sooner. It appears that Dr. Nicholaus is not available to operate on him and got one of his partners who has an available date to operate on him on 06-20-2024. Discussed with patient that this may be the earliest that [facility] could accommodate him.   Discussed that if he chooses to switch to [facility], then they could possibly operate on him but it may be even later than June 20, 2024.   Pt states he is still urinating blood.

## 2024-05-15 NOTE — Assessment & Plan Note (Signed)
 05-14-2024 Had 1 episode on arrival which resolved after taking home dose of metoprolol . - Continue to monitor -Continue with metoprolol   05/15/24 stable. On NSR. Continue lopressor .

## 2024-05-16 ENCOUNTER — Inpatient Hospital Stay (HOSPITAL_COMMUNITY)

## 2024-05-16 DIAGNOSIS — R310 Gross hematuria: Secondary | ICD-10-CM | POA: Diagnosis not present

## 2024-05-16 LAB — CBC WITH DIFFERENTIAL/PLATELET
Abs Immature Granulocytes: 0.03 K/uL (ref 0.00–0.07)
Basophils Absolute: 0 K/uL (ref 0.0–0.1)
Basophils Relative: 0 %
Eosinophils Absolute: 0.1 K/uL (ref 0.0–0.5)
Eosinophils Relative: 1 %
HCT: 23.2 % — ABNORMAL LOW (ref 39.0–52.0)
Hemoglobin: 7.3 g/dL — ABNORMAL LOW (ref 13.0–17.0)
Immature Granulocytes: 0 %
Lymphocytes Relative: 9 %
Lymphs Abs: 0.9 K/uL (ref 0.7–4.0)
MCH: 30.9 pg (ref 26.0–34.0)
MCHC: 31.5 g/dL (ref 30.0–36.0)
MCV: 98.3 fL (ref 80.0–100.0)
Monocytes Absolute: 1.1 K/uL — ABNORMAL HIGH (ref 0.1–1.0)
Monocytes Relative: 12 %
Neutro Abs: 7.3 K/uL (ref 1.7–7.7)
Neutrophils Relative %: 78 %
Platelets: 188 K/uL (ref 150–400)
RBC: 2.36 MIL/uL — ABNORMAL LOW (ref 4.22–5.81)
RDW: 19.4 % — ABNORMAL HIGH (ref 11.5–15.5)
WBC: 9.3 K/uL (ref 4.0–10.5)
nRBC: 0 % (ref 0.0–0.2)

## 2024-05-16 LAB — CBC
HCT: 21.3 % — ABNORMAL LOW (ref 39.0–52.0)
Hemoglobin: 6.8 g/dL — CL (ref 13.0–17.0)
MCH: 31.3 pg (ref 26.0–34.0)
MCHC: 31.9 g/dL (ref 30.0–36.0)
MCV: 98.2 fL (ref 80.0–100.0)
Platelets: 174 K/uL (ref 150–400)
RBC: 2.17 MIL/uL — ABNORMAL LOW (ref 4.22–5.81)
RDW: 18.5 % — ABNORMAL HIGH (ref 11.5–15.5)
WBC: 8.1 K/uL (ref 4.0–10.5)
nRBC: 0 % (ref 0.0–0.2)

## 2024-05-16 LAB — RENAL FUNCTION PANEL
Albumin: 3.1 g/dL — ABNORMAL LOW (ref 3.5–5.0)
Anion gap: 15 (ref 5–15)
BUN: 46 mg/dL — ABNORMAL HIGH (ref 6–20)
CO2: 30 mmol/L (ref 22–32)
Calcium: 9.4 mg/dL (ref 8.9–10.3)
Chloride: 88 mmol/L — ABNORMAL LOW (ref 98–111)
Creatinine, Ser: 10.93 mg/dL — ABNORMAL HIGH (ref 0.61–1.24)
GFR, Estimated: 5 mL/min — ABNORMAL LOW (ref >60)
Glucose, Bld: 150 mg/dL — ABNORMAL HIGH (ref 70–99)
Phosphorus: 4.4 mg/dL (ref 2.5–4.6)
Potassium: 3.5 mmol/L (ref 3.5–5.1)
Sodium: 133 mmol/L — ABNORMAL LOW (ref 135–145)

## 2024-05-16 LAB — PREPARE RBC (CROSSMATCH)

## 2024-05-16 LAB — HEMOGLOBIN AND HEMATOCRIT, BLOOD
HCT: 22.7 % — ABNORMAL LOW (ref 39.0–52.0)
Hemoglobin: 7.3 g/dL — ABNORMAL LOW (ref 13.0–17.0)

## 2024-05-16 MED ORDER — SODIUM CHLORIDE 0.9% IV SOLUTION: Freq: Once | INTRAVENOUS | Status: DC

## 2024-05-16 MED ORDER — ADENOSINE 6 MG/2ML IV SOLN
INTRAVENOUS | Status: AC
  Administered 2024-05-16: 6 mg via INTRAVENOUS
  Filled 2024-05-16: qty 2

## 2024-05-16 MED ORDER — SODIUM CHLORIDE 0.9 % IV BOLUS
250.0000 mL | Freq: Once | INTRAVENOUS | Status: AC
Start: 2024-05-16 — End: 2024-05-16
  Administered 2024-05-16: 250 mL via INTRAVENOUS

## 2024-05-16 MED ORDER — METOPROLOL TARTRATE 5 MG/5ML IV SOLN: 5.0000 mg | Freq: Once | INTRAVENOUS | Status: DC

## 2024-05-16 MED ORDER — ADENOSINE 6 MG/2ML IV SOLN
INTRAVENOUS | Status: AC
  Filled 2024-05-16: qty 4

## 2024-05-16 MED ORDER — AMIODARONE IV BOLUS ONLY 150 MG/100ML: 150.0000 mg | INTRAVENOUS | Status: DC | PRN

## 2024-05-16 MED ORDER — ADENOSINE 6 MG/2ML IV SOLN: 6.0000 mg | Freq: Once | INTRAVENOUS | Status: AC

## 2024-05-16 NOTE — Progress Notes (Addendum)
 [facility] Progress Note  Subjective:  Seen in room  Vitals:   05/16/24 0121 05/16/24 0450 05/16/24 0823 05/16/24 1115  BP: 128/63 120/69 120/80 111/69  Pulse: (!) 102 (!) 105 81 81  Resp: 19  18   Temp: 99.6 F (37.6 C) 98.8 F (37.1 C) 99 F (37.2 C) 98.9 F (37.2 C)  TempSrc: Oral   Oral  SpO2: 96% 98% 99% 98%  Weight:      Height:        Exam:  alert, nad   no jvd  Chest cta bilat  Cor reg no RG  Abd soft ntnd no ascites   Ext no LE edema   Alert, NF, ox3  L AVF +bruit   Home bp meds: Lopressor  12.5 bid Midodrine  5mg  pre hd mwf   OP HD: MWF NW 4.5h   B450   136kg   2K bath   AVF  Heparin  none - Mircera 150 q 2 weeks (last 9/22) - Calcitriol  2.70mcg PO q HD - Outpatient Hgb trends: 8.8 (9/9), 7.3 (9/17), 7.4 (9/24)   Assessment/Plan:  Gross hematuria with clots: Urology consulted. They felt that the bladder cancer plus radiation cystitis was likely causing bladder bleeding. The hx of long, stringy clots could suggest an upper renal bleed, but bladder bleeding + refluxing up the ureters could also cause the same. CT abd showed normal appearing atrophic kidneys without clots or hematoma.  He did have a perirenal RP bleed (see #7) back in June but this does not appear to be the situation here. Direct cystoscopy done 9/29 showed no bladder clots. Per urology.  ESRD: HD is MWF. Had HD yest off schedule. Plan next HD today to get back on schedule.   Hx SVT: Continue BB for now.  BP: bp's stable 120/80 range  Anemia: Hb 7- 8 here. Not due for ESA yet.  Metabolic bone disease: Ca ok, continue home meds. H/o large right-sided perinephric hematoma: in early June 2025, w/ hydronephrosis and clot in the renal pelvis. Rec'd 3u prbc's and 1g TXA, bleeding slowed down and pt was dc'd.    Myer Fret MD  [facility] 05/16/2024, 11:25 AM  Recent Labs  Lab 05/15/24 0825 05/15/24 1310 05/16/24 0151  HGB 7.2* 7.8* 7.3*  ALBUMIN  3.3* 3.5  --   CALCIUM  9.2 9.3  --    PHOS 6.1* 3.6  --   CREATININE 13.26* 7.19*  --   K 4.0 3.8  --    No results for input(s): IRON, TIBC, FERRITIN in the last 168 hours. Inpatient medications:  atorvastatin   40 mg Oral Daily   Chlorhexidine  Gluconate Cloth  6 each Topical Q0600   cinacalcet   60 mg Oral Q breakfast   metoprolol  tartrate  12.5 mg Oral BID   midodrine   5 mg Oral Q M,W,F-HD   sevelamer  carbonate  2,400 mg Oral TID WC    cefTRIAXone (ROCEPHIN)  IV 1 g (05/15/24 1514)   acetaminophen  **OR** acetaminophen , HYDROcodone -acetaminophen , ondansetron  **OR** ondansetron  (ZOFRAN ) IV, senna-docusate

## 2024-05-16 NOTE — Significant Event (Addendum)
 Rapid Response Event Note   Reason for Call :  SVT 180  Initial Focused Assessment:  Patient in no acute distress, A&O with minimal complaints of chest pain and palpitations. States it started not that long ago and won't last long. Skin warm/dry. Lungs clear/diminished. Heart tones fast!  111/69 (79) HR 180s RR 21 O2 100% RA  Noted patient received scheduled PO 12.5mg  metoprolol  1040.   Interventions/Plan of Care:  EKG already completed SVT 180 Unsuccessful Valsalva/modified valsalva maneuvers Patient SBP 80s-90s MD Pahwani to bedside Zoll pads placed Adenosine  6mg  IV 250ml NS bolus Telemetry ordered and placed PCU tx   Event Summary:  MD Notified: FABIENE Skeeter MD Call Time: 8871 Arrival Time: 1133 End Time: 1225  Tonna Chiquita POUR, RN

## 2024-05-16 NOTE — Progress Notes (Signed)
 PROGRESS NOTE    Devin Bullock  FMW:[id number] DOB: [birth year] DOA: 05/13/2024 PCP: [facility]   Brief Narrative:  Devin Bullock is a 58 y.o. male with medical history significant for ESRD on MWF HD, prostate and bladder cancer s/p TURBT 2022, anemia of CKD, paroxysmal SVT, history of spontaneous right perinephric hematoma (June 2025) who presented to the ED for evaluation of hematuria with large blood clots for 2 days associated with suprapubic and urethral pain.  Patient was previously admitted in June for acute blood loss anemia due to spontaneous right perinephric requiring 4 unit PRBC transfusions.  He was monitored closely and did not require any other specific intervention.  Patient's primary nephrology and urology teams are with [facility] in [place].  He was previously receiving BCG treatments which were held due to ongoing urethral bleeding.  He underwent cystoscopy 04/19/2024 which showed a tumor in the bladder.  He is scheduled for TURBT on 06/21/2024.   CT abdomen/pelvis with contrast showed severe bilateral hydroureteronephrosis, unchanged from prior exam. High density material seen dependently within the bilateral renal pelves and mid right ureter, likely sediment or blood products. Chronic bladder wall thickening and perivesicular fat stranding.    Patient was given IV fentanyl  25 mcg.  EDP discussed with urology resident Dr. Mitchell and IR Dr. Hughes.  They recommended CT angio abdomen/pelvis which did not show any extravasation.  Patient was admitted to hospital service.  Assessment & Plan:   Principal Problem:   Gross hematuria Active Problems:   Bladder cancer (HCC)   Obesity, Class II, BMI 35-39.9   Anemia of chronic disease   ESRD (end stage renal disease) on dialysis [facility])   Essential hypertension   HFrEF (heart failure with reduced ejection fraction) (HCC)   Paroxysmal SVT (supraventricular tachycardia)   Hyperlipidemia  History of  prostate/bladder cancer and spontaneous right perinephric hematoma in June 2025/gross hematuria: CTA and cystoscopy unable to locate the source of bleeding, concern of ureteral source based on clot morphology and a clear bladder on cystoscopy. Patient is high risk due to history of bladder cancer. Unable to tolerate Foley placement and manual irrigation.Urology was on board and I received a message from my colleague Dr. Candyce who was the hospitalist for this patient yesterday that he received a call from PA of urology that unfortunately urology here at [facility] is not able to offer any other intervention to the patient and recommended that patient be transferred to [facility] or go home and follow-up with them as outpatient.  Patient is not willing to go home, he is obsessed with treating his hematuria.  I had lengthy discussion with the patient and offered him the options to discharge and follow-up with his primary urologist as outpatient, be transferred inpatient inpatient from [facility] or transition his care to [facility] urology however that may not help him getting his surgery sooner than November 5 which is scheduled for him with his primary urologist.  He did not make any decisions but continue to say  I came here with the bleeding, I cannot go home with the bleeding I am not sure if he understands what we keep telling him.  It is my understanding that previous hospitalist as well as nephrologist has been telling him the same thing that I did today.  He continues to say that his bleeding is not coming from bladder as shown on the cystoscopy and he believes that he is bleeding from kidneys and that  his nephrology problem.  Discussed with nephrology, per them, this is still urology problem and I agree with that.  For his insistence, I am obtaining CT renal stone to make sure there is no nephrolithiasis.  Acute blood loss anemia secondary to hematuria: patient has received 1 unit of PBC transfusion this  hospitalization on 05/14/2024.  His hemoglobin is 7.3 once again on recheck this morning.  No indication for transfusion.  He was insisting to transfuse him.  Hyperlipidemia: Continue atorvastatin .  History of SVT: Patient had 1 episode of SVT in the ED which was treated with his oral metoprolol .  I saw him earlier in the morning.  He was doing fine.  Rapid response was called around 11:00 for SVT.  I went and saw him at the bedside.  Heart rate was around 180.  Patient had received metoprolol  about 40 minutes prior to this.  Patient was completely asymptomatic.  We gave him adenosine  6 mg.  With that, his heart rate came down to 115.  Patient remained asymptomatic.  We are transferring this patient to progressive care unit with cardiac monitoring.  His blood pressure was low with a MAP of 70, I ordered 250 cc fluid bolus and another 5 mg of IV Lopressor .  Heart failure with reduced ejection fraction: Appears stable.  Continue Lopressor .  Hypotension: Patient on midodrine .  ESRD on HD: Monday Wednesday Friday schedule.  Management per nephrology.  Class II obesity: BMI almost 38.  Weight loss and diet modification counseled.  DVT prophylaxis: SCDs Start: 05/13/24 2350   Code Status: Full Code  Family Communication:  None present at bedside.  Plan of care discussed with patient in length and he/she verbalized understanding and agreed with it.  Status is: Inpatient Remains inpatient appropriate because: Still having hematuria, CT renal stone pending, had episode of SVT.   Estimated body mass index is 37.68 kg/m as calculated from the following:   Height as of this encounter: 6' 4 (1.93 m).   Weight as of this encounter: 140.4 kg.    Nutritional Assessment: Body mass index is 37.68 kg/m.SABRA Seen by dietician.  I agree with the assessment and plan as outlined below: Nutrition Status:        . Skin Assessment: I have examined the patient's skin and I agree with the wound assessment as  performed by the wound care RN as outlined below:    Consultants:  Nephrology  Urology-signed off  Procedures:  As above  Antimicrobials:  Anti-infectives (From admission, onward)    Start     Dose/Rate Route Frequency Ordered Stop   05/15/24 1400  cefTRIAXone (ROCEPHIN) 1 g in sodium chloride  0.9 % 100 mL IVPB       Note to Pharmacy: Please tube to 2W   1 g 200 mL/hr over 30 Minutes Intravenous Daily 05/15/24 1009           Subjective: Patient seen and examined twice today.  He had no specific complaints but he continued to talk about hematuria despite of all the explanation.  Objective: Vitals:   05/16/24 1207 05/16/24 1212 05/16/24 1213 05/16/24 1218  BP: (!) 99/58 (!) 84/53 (!) 104/57 102/60  Pulse: (!) 115 (!) 113 (!) 126 (!) 113  Resp:      Temp:      TempSrc:      SpO2: 100% 99%  99%  Weight:      Height:        Intake/Output Summary (Last 24 hours) at 05/16/2024  1237 Last data filed at 05/16/2024 0600 Gross per 24 hour  Intake 240 ml  Output 0 ml  Net 240 ml   Filed Weights   05/13/24 1838 05/15/24 0815 05/15/24 1205  Weight: 136.1 kg (!) 142.8 kg (!) 140.4 kg    Examination:  General exam: Appears calm and comfortable  Respiratory system: Clear to auscultation. Respiratory effort normal. Cardiovascular system: S1 & S2 heard, RRR. No JVD, murmurs, rubs, gallops or clicks. No pedal edema. Gastrointestinal system: Abdomen is nondistended, soft and nontender. No organomegaly or masses felt. Normal bowel sounds heard. Central nervous system: Alert and oriented. No focal neurological deficits. Extremities: Symmetric 5 x 5 power. Skin: No rashes, lesions or ulcers Psychiatry: Judgement and insight appear normal. Mood & affect appropriate.    Data Reviewed: I have personally reviewed following labs and imaging studies  CBC: Recent Labs  Lab 05/13/24 1848 05/13/24 1854 05/14/24 0437 05/14/24 1433 05/14/24 2100 05/15/24 0825 05/15/24 1310  05/16/24 0151 05/16/24 1142  WBC 6.5  --  7.2  --   --  8.2  --  9.3  --   NEUTROABS  --   --   --   --   --   --   --  7.3  --   HGB 7.7*   < > 7.6*   < > 6.7* 7.2* 7.8* 7.3* 7.3*  HCT 24.3*   < > 24.1*   < > 21.1* 22.5* 24.3* 23.2* 22.7*  MCV 101.7*  --  102.1*  --   --  97.8  --  98.3  --   PLT 198  --  201  --   --  164  --  188  --    < > = values in this interval not displayed.   Basic Metabolic Panel: Recent Labs  Lab 05/13/24 1848 05/13/24 1854 05/14/24 0437 05/15/24 0825 05/15/24 1310  NA 139 138 136 134* 135  K 3.7 3.6 3.6 4.0 3.8  CL 93* 97* 92* 90* 94*  CO2 26  --  28 27 27   GLUCOSE 110* 111* 162* 129* 116*  BUN 42* 40* 45* 58* 25*  CREATININE 10.52* 11.50* 11.79* 13.26* 7.19*  CALCIUM  9.8  --  9.6 9.2 9.3  PHOS  --   --   --  6.1* 3.6   GFR: Estimated Creatinine Clearance: 16.7 mL/min (A) (by C-G formula based on SCr of 7.19 mg/dL (H)). Liver Function Tests: Recent Labs  Lab 05/13/24 1848 05/15/24 0825 05/15/24 1310  AST 28  --   --   ALT 15  --   --   ALKPHOS 125  --   --   BILITOT 0.6  --   --   PROT 7.2  --   --   ALBUMIN  3.9 3.3* 3.5   No results for input(s): LIPASE, AMYLASE in the last 168 hours. No results for input(s): AMMONIA in the last 168 hours. Coagulation Profile: Recent Labs  Lab 05/13/24 2205  INR 1.2   Cardiac Enzymes: No results for input(s): CKTOTAL, CKMB, CKMBINDEX, TROPONINI in the last 168 hours. BNP (last 3 results) No results for input(s): PROBNP in the last 8760 hours. HbA1C: No results for input(s): HGBA1C in the last 72 hours. CBG: No results for input(s): GLUCAP in the last 168 hours. Lipid Profile: No results for input(s): CHOL, HDL, LDLCALC, TRIG, CHOLHDL, LDLDIRECT in the last 72 hours. Thyroid  Function Tests: No results for input(s): TSH, T4TOTAL, FREET4, T3FREE, THYROIDAB in the last 72 hours.  Anemia Panel: No results for input(s): VITAMINB12, FOLATE,  FERRITIN, TIBC, IRON, RETICCTPCT in the last 72 hours. Sepsis Labs: Recent Labs  Lab 05/13/24 1854  LATICACIDVEN 2.3*    No results found for this or any previous visit (from the past 240 hours).   Radiology Studies: CT RENAL STONE STUDY Result Date: 05/16/2024 CLINICAL DATA:  Abdominal and flank pain. Hematuria. History of prostate and bladder carcinoma. * Tracking Code: BO * EXAM: CT ABDOMEN AND PELVIS WITHOUT CONTRAST TECHNIQUE: Multidetector CT imaging of the abdomen and pelvis was performed following the standard protocol without IV contrast. RADIATION DOSE REDUCTION: This exam was performed according to the departmental dose-optimization program which includes automated exposure control, adjustment of the mA and/or kV according to patient size and/or use of iterative reconstruction technique. COMPARISON:  05/13/2024 FINDINGS: Lower chest: Stable elevated right hemidiaphragm with left basilar atelectasis versus scarring. Hepatobiliary: Stable small hepatic cysts. No mass visualized on this unenhanced exam. Gallbladder is unremarkable. No evidence of biliary ductal dilatation. Pancreas: No mass or inflammatory process visualized on this unenhanced exam. Spleen:  Within normal limits in size. Adrenals/Urinary tract: Bilateral renal parenchymal atrophy and numerous tiny cystic lesions again noted. Acute right renal swelling and perinephric stranding is seen with increased moderate to severe hydroureteronephrosis to the level of bladder. No ureteral calculi identified. High attenuation material in the dilated renal pelvis is suspicious for blood clot. Urinary bladder is empty and not well evaluated. Some high attenuation material in the central bladder lumen could be due to blood clot or bladder mass. Left extrarenal pelvis is stable and also contains high attenuation material suspicious for blood clot. Stomach/Bowel: No evidence of obstruction, inflammatory process, or abnormal fluid  collections. Normal appendix visualized. Mild sigmoid diverticulosis, without signs of diverticulitis. Vascular/Lymphatic: No pathologically enlarged lymph nodes identified. No evidence of abdominal aortic aneurysm. Reproductive:  No mass or other significant abnormality. Other:  None. Musculoskeletal:  No suspicious bone lesions identified. IMPRESSION: Increased moderate to severe right hydroureteronephrosis to the level of the urinary bladder. No ureteral calculi identified. Increased right renal swelling and perinephric stranding, which may be due to obstructive uropathy or pyelonephritis. Nearly empty bladder is poorly evaluated, but high attenuation material in the central bladder lumen could be due to blood clot or bladder mass. Consider further evaluation with CT urogram without and with contrast. High attenuation material in the dilated right renal pelvis and left extrarenal pelvis, likely due to blood clot. Stable bilateral renal parenchymal atrophy and numerous tiny cystic lesions. Mild sigmoid diverticulosis, without radiographic evidence of diverticulitis. Electronically Signed   By: Norleen DELENA Kil M.D.   On: 05/16/2024 11:47    Scheduled Meds:  adenosine        atorvastatin   40 mg Oral Daily   Chlorhexidine  Gluconate Cloth  6 each Topical Q0600   cinacalcet   60 mg Oral Q breakfast   metoprolol  tartrate  5 mg Intravenous Once   metoprolol  tartrate  12.5 mg Oral BID   midodrine   5 mg Oral Q M,W,F-HD   sevelamer  carbonate  2,400 mg Oral TID WC   Continuous Infusions:  cefTRIAXone (ROCEPHIN)  IV 1 g (05/15/24 1514)     LOS: 2 days   Fredia Skeeter, MD [facility]  05/16/2024, 12:37 PM  Total time spent 55 minutes *Please note that this is a verbal dictation therefore any spelling or grammatical errors are due to the Dragon Medical One system interpretation.  Please page via Amion and do not message via secure chat for  urgent patient care matters. Secure chat can be used for non  urgent patient care matters.  How to contact the [facility] Attending or Consulting provider 7A - 7P or covering provider during after hours 7P -7A, for this patient?  Check the care team in [facility] and look for a) attending/consulting [facility] provider listed and b) the [facility] team listed. Page or secure chat 7A-7P. Log into [email or web address] and use [facility]'s universal password to access. If you do not have the password, please contact the hospital operator. Locate the [facility] provider you are looking for under [facility] and page to a number that you can be directly reached. If you still have difficulty reaching the provider, please page the [facility] (Director on Call) for the Hospitalists listed on amion for assistance.

## 2024-05-17 DIAGNOSIS — R310 Gross hematuria: Secondary | ICD-10-CM | POA: Diagnosis not present

## 2024-05-17 LAB — TYPE AND SCREEN
ABO/RH(D): O POS
Antibody Screen: NEGATIVE
Unit division: 0
Unit division: 0

## 2024-05-17 LAB — CBC WITH DIFFERENTIAL/PLATELET
Abs Immature Granulocytes: 0.03 K/uL (ref 0.00–0.07)
Basophils Absolute: 0 K/uL (ref 0.0–0.1)
Basophils Relative: 0 %
Eosinophils Absolute: 0.1 K/uL (ref 0.0–0.5)
Eosinophils Relative: 2 %
HCT: 23.5 % — ABNORMAL LOW (ref 39.0–52.0)
Hemoglobin: 7.5 g/dL — ABNORMAL LOW (ref 13.0–17.0)
Immature Granulocytes: 1 %
Lymphocytes Relative: 13 %
Lymphs Abs: 0.9 K/uL (ref 0.7–4.0)
MCH: 30.5 pg (ref 26.0–34.0)
MCHC: 31.9 g/dL (ref 30.0–36.0)
MCV: 95.5 fL (ref 80.0–100.0)
Monocytes Absolute: 1 K/uL (ref 0.1–1.0)
Monocytes Relative: 15 %
Neutro Abs: 4.6 K/uL (ref 1.7–7.7)
Neutrophils Relative %: 69 %
Platelets: 138 K/uL — ABNORMAL LOW (ref 150–400)
RBC: 2.46 MIL/uL — ABNORMAL LOW (ref 4.22–5.81)
RDW: 18.3 % — ABNORMAL HIGH (ref 11.5–15.5)
WBC: 6.6 K/uL (ref 4.0–10.5)
nRBC: 0 % (ref 0.0–0.2)

## 2024-05-17 LAB — BPAM RBC
Blood Product Expiration Date: 202510272359
Blood Product Expiration Date: 202510312359
ISSUE DATE / TIME: 202509300019
ISSUE DATE / TIME: 202510012126
Unit Type and Rh: 202510272359
Unit Type and Rh: 5100
Unit Type and Rh: 5100

## 2024-05-17 MED ORDER — CHLORHEXIDINE GLUCONATE CLOTH 2 % EX PADS
6.0000 | MEDICATED_PAD | Freq: Every day | CUTANEOUS | Status: DC
Start: 2024-05-17 — End: 2024-05-20
  Administered 2024-05-17 – 2024-05-18 (×2): 6 via TOPICAL

## 2024-05-17 NOTE — Telephone Encounter (Signed)
 Doyal Beal, NP is calling in regard for patient to have clearance for a colonoscopy. Please advise   [phone]

## 2024-05-17 NOTE — Progress Notes (Signed)
 PROGRESS NOTE    [name]  FMW:[id number] DOB: Nov 21, 1965 DOA: 05/13/2024 PCP: [facility]   Brief Narrative:  Devin Bullock is a 58 y.o. male with medical history significant for ESRD on MWF HD, prostate and bladder cancer s/p TURBT 2022, anemia of CKD, paroxysmal SVT, history of spontaneous right perinephric hematoma (June 2025) who presented to the ED for evaluation of hematuria with large blood clots for 2 days associated with suprapubic and urethral pain.  Patient was previously admitted in June for acute blood loss anemia due to spontaneous right perinephric requiring 4 unit PRBC transfusions.  He was monitored closely and did not require any other specific intervention.  Patient's primary nephrology and urology teams are with [facility] in [place].  He was previously receiving BCG treatments which were held due to ongoing urethral bleeding.  He underwent cystoscopy 04/19/2024 which showed a tumor in the bladder.  He is scheduled for TURBT on 06/21/2024.   CT abdomen/pelvis with contrast showed severe bilateral hydroureteronephrosis, unchanged from prior exam. High density material seen dependently within the bilateral renal pelves and mid right ureter, likely sediment or blood products. Chronic bladder wall thickening and perivesicular fat stranding.    Patient was given IV fentanyl  25 mcg.  EDP discussed with urology resident Dr. Mitchell and IR Dr. Hughes.  They recommended CT angio abdomen/pelvis which did not show any extravasation.  Patient was admitted to hospital service.  Assessment & Plan:   Principal Problem:   Gross hematuria Active Problems:   Bladder cancer (HCC)   Obesity, Class II, BMI 35-39.9   Anemia of chronic disease   ESRD (end stage renal disease) on dialysis [facility])   Essential hypertension   HFrEF (heart failure with reduced ejection fraction) (HCC)   Paroxysmal SVT (supraventricular tachycardia)   Hyperlipidemia  History of  prostate/bladder cancer and spontaneous right perinephric hematoma in June 2025/gross hematuria: CTA and cystoscopy unable to locate the source of bleeding, concern of ureteral source based on clot morphology and a clear bladder on cystoscopy. Patient is high risk due to history of bladder cancer. Unable to tolerate Foley placement and manual irrigation.Urology was on board and on the morning of 05/16/2024, I received a message from my colleague Dr. Candyce who was the hospitalist for this patient yesterday that he received a call from PA of urology that unfortunately urology here at [facility] is not able to offer any other intervention to the patient and recommended that patient be transferred to [facility] or go home and follow-up with them as outpatient.  Patient is not willing to go home, he is obsessed with treating his hematuria.  I had lengthy discussion with the patient and offered him the options to discharge and follow-up with his primary urologist as outpatient, be transferred inpatient inpatient from [facility] or transition his care to [facility] urology however that may not help him getting his surgery sooner than November 5 which is scheduled for him with his primary urologist.  He did not make any decisions but continue to say  I came here with the bleeding, I cannot go home with the bleeding I am not sure if he understands what we keep telling him.  It is my understanding that previous hospitalist as well as nephrologist has been telling him the same thing that I did today.  He continues to say that his bleeding is not coming from bladder as shown on the cystoscopy and he believes that he is  bleeding from kidneys and that his nephrology problem.  Discussed with nephrology, per them, this is still urology problem and I agree with that.  I ordered CT renal stone study which shows moderate to severe right hydroureteronephrosis, increased right renal swelling and perinephric stranding, high attenuation  material and dilated right renal pelvis and left extrarenal pelvis likely due to blood clot and possible bladder mass which was known to us .  I consulted IR to take a look at him again for possible need of embolization.  They looked at the scan and spoke to him.  Per them, there is no target for embolization.  I had another lengthy discussion with the patient and he neither wants to get discharged, nor wants to be transferred to [facility] for urology care.  He wants to [facility].  I spoke to PA of the urology and he told me that they are not going to accept the patient for the care because of prolonged urological history and care that he had received at [facility].  They are going to call them to see urology to see if they will accept however I was just informed by the nurse that patient does not want to be transferred to [facility] because he lives in [place] and [facility] is in [place].  Unfortunately, this patient has no other option at this point.  Acute blood loss anemia secondary to hematuria: patient has received 1 unit of PBC transfusion this hospitalization on 05/14/2024.  His hemoglobin dropped again down to 6.8 for which he received another unit of PRBC transfusion on 05/16/2024.  Hyperlipidemia: Continue atorvastatin .  History of SVT: Patient had 1 episode of SVT in the ED which was treated with his oral metoprolol .  I saw him earlier in the morning.  He was doing fine.  Rapid response was called around 11:00 for SVT.  I went and saw him at the bedside.  Heart rate was around 180.  Patient had received metoprolol  about 40 minutes prior to this.  Patient was completely asymptomatic.  We gave him adenosine  6 mg.  With that, his heart rate came down to 115.  Patient remained asymptomatic.  We are transferring this patient to progressive care unit with cardiac monitoring.  His blood pressure was low with a MAP of 70, I ordered 250 cc fluid bolus and another 5 mg of IV  Lopressor .  Patient has remained stable ever since.  He remains on Lopressor  12.5 mg p.o. twice daily.  We are restricted with low-dose due to him having intermittent hypotension.  Heart failure with reduced ejection fraction: Appears stable.  Continue Lopressor .  Hypotension: Patient on midodrine .  ESRD on HD: Monday Wednesday Friday schedule.  Management per nephrology.  Class II obesity: BMI almost 38.  Weight loss and diet modification counseled.  DVT prophylaxis: SCDs Start: 05/13/24 2350   Code Status: Full Code  Family Communication:  None present at bedside.  Plan of care discussed with patient in length and he/she verbalized understanding and agreed with it.  Status is: Inpatient Remains inpatient appropriate because: Still having hematuria   Estimated body mass index is 36.87 kg/m as calculated from the following:   Height as of this encounter: 6' 4 (1.93 m).   Weight as of this encounter: 137.4 kg.    Nutritional Assessment: Body mass index is 36.87 kg/m.SABRA Seen by dietician.  I agree with the assessment and plan as outlined below: Nutrition Status:        .  Skin Assessment: I have examined the patient's skin and I agree with the wound assessment as performed by the wound care RN as outlined below:    Consultants:  Nephrology  Urology-signed off  Procedures:  As above  Antimicrobials:  Anti-infectives (From admission, onward)    Start     Dose/Rate Route Frequency Ordered Stop   05/15/24 1400  cefTRIAXone (ROCEPHIN) 1 g in sodium chloride  0.9 % 100 mL IVPB       Note to Pharmacy: Please tube to 2W   1 g 200 mL/hr over 30 Minutes Intravenous Daily 05/15/24 1009           Subjective: Seen and examined.  Continues to have hematuria but appears to have improved.  No other complaint.  Patient is not willing to listen or understand any reasonable conversation.  He is fixated on his hematuria to be fixed here at [facility] which per urology, they are  unable to do anything for him.  Objective: Vitals:   05/16/24 2311 05/17/24 0016 05/17/24 0351 05/17/24 0755  BP: 116/62  104/65 105/89  Pulse: 94  91 99  Resp: 20 18 20    Temp: 98.3 F (36.8 C) 98.6 F (37 C) 98.2 F (36.8 C) 98.3 F (36.8 C)  TempSrc: Oral Oral Oral Oral  SpO2: 100%   99%  Weight:   (!) 137.4 kg   Height:        Intake/Output Summary (Last 24 hours) at 05/17/2024 9177 Last data filed at 05/17/2024 0300 Gross per 24 hour  Intake 414 ml  Output 1000 ml  Net -586 ml   Filed Weights   05/16/24 1443 05/16/24 1844 05/17/24 0351  Weight: (!) 139.6 kg (!) 138.6 kg (!) 137.4 kg    Examination:  General exam: Appears calm and comfortable  Respiratory system: Clear to auscultation. Respiratory effort normal. Cardiovascular system: S1 & S2 heard, RRR. No JVD, murmurs, rubs, gallops or clicks. No pedal edema. Gastrointestinal system: Abdomen is nondistended, soft and nontender. No organomegaly or masses felt. Normal bowel sounds heard. Central nervous system: Alert and oriented. No focal neurological deficits. Extremities: Symmetric 5 x 5 power. Skin: No rashes, lesions or ulcers.   Data Reviewed: I have personally reviewed following labs and imaging studies  CBC: Recent Labs  Lab 05/14/24 0437 05/14/24 1433 05/15/24 0825 05/15/24 1310 05/16/24 0151 05/16/24 1142 05/16/24 1704 05/17/24 0316  WBC 7.2  --  8.2  --  9.3  --  8.1 6.6  NEUTROABS  --   --   --   --  7.3  --   --  4.6  HGB 7.6*   < > 7.2* 7.8* 7.3* 7.3* 6.8* 7.5*  HCT 24.1*   < > 22.5* 24.3* 23.2* 22.7* 21.3* 23.5*  MCV 102.1*  --  97.8  --  98.3  --  98.2 95.5  PLT 201  --  164  --  188  --  174 138*   < > = values in this interval not displayed.   Basic Metabolic Panel: Recent Labs  Lab 05/13/24 1848 05/13/24 1854 05/14/24 0437 05/15/24 0825 05/15/24 1310 05/16/24 1704  NA 139 138 136 134* 135 133*  K 3.7 3.6 3.6 4.0 3.8 3.5  CL 93* 97* 92* 90* 94* 88*  CO2 26  --  28 27 27 30    GLUCOSE 110* 111* 162* 129* 116* 150*  BUN 42* 40* 45* 58* 25* 46*  CREATININE 10.52* 11.50* 11.79* 13.26* 7.19* 10.93*  CALCIUM  9.8  --  9.6 9.2 9.3 9.4  PHOS  --   --   --  6.1* 3.6 4.4   GFR: Estimated Creatinine Clearance: 10.9 mL/min (A) (by C-G formula based on SCr of 10.93 mg/dL (H)). Liver Function Tests: Recent Labs  Lab 05/13/24 1848 05/15/24 0825 05/15/24 1310 05/16/24 1704  AST 28  --   --   --   ALT 15  --   --   --   ALKPHOS 125  --   --   --   BILITOT 0.6  --   --   --   PROT 7.2  --   --   --   ALBUMIN  3.9 3.3* 3.5 3.1*   No results for input(s): LIPASE, AMYLASE in the last 168 hours. No results for input(s): AMMONIA in the last 168 hours. Coagulation Profile: Recent Labs  Lab 05/13/24 2205  INR 1.2   Cardiac Enzymes: No results for input(s): CKTOTAL, CKMB, CKMBINDEX, TROPONINI in the last 168 hours. BNP (last 3 results) No results for input(s): PROBNP in the last 8760 hours. HbA1C: No results for input(s): HGBA1C in the last 72 hours. CBG: No results for input(s): GLUCAP in the last 168 hours. Lipid Profile: No results for input(s): CHOL, HDL, LDLCALC, TRIG, CHOLHDL, LDLDIRECT in the last 72 hours. Thyroid  Function Tests: No results for input(s): TSH, T4TOTAL, FREET4, T3FREE, THYROIDAB in the last 72 hours. Anemia Panel: No results for input(s): VITAMINB12, FOLATE, FERRITIN, TIBC, IRON, RETICCTPCT in the last 72 hours. Sepsis Labs: Recent Labs  Lab 05/13/24 1854  LATICACIDVEN 2.3*    No results found for this or any previous visit (from the past 240 hours).   Radiology Studies: CT RENAL STONE STUDY Result Date: 05/16/2024 CLINICAL DATA:  Abdominal and flank pain. Hematuria. History of prostate and bladder carcinoma. * Tracking Code: BO * EXAM: CT ABDOMEN AND PELVIS WITHOUT CONTRAST TECHNIQUE: Multidetector CT imaging of the abdomen and pelvis was performed following the standard protocol  without IV contrast. RADIATION DOSE REDUCTION: This exam was performed according to the departmental dose-optimization program which includes automated exposure control, adjustment of the mA and/or kV according to patient size and/or use of iterative reconstruction technique. COMPARISON:  05/13/2024 FINDINGS: Lower chest: Stable elevated right hemidiaphragm with left basilar atelectasis versus scarring. Hepatobiliary: Stable small hepatic cysts. No mass visualized on this unenhanced exam. Gallbladder is unremarkable. No evidence of biliary ductal dilatation. Pancreas: No mass or inflammatory process visualized on this unenhanced exam. Spleen:  Within normal limits in size. Adrenals/Urinary tract: Bilateral renal parenchymal atrophy and numerous tiny cystic lesions again noted. Acute right renal swelling and perinephric stranding is seen with increased moderate to severe hydroureteronephrosis to the level of bladder. No ureteral calculi identified. High attenuation material in the dilated renal pelvis is suspicious for blood clot. Urinary bladder is empty and not well evaluated. Some high attenuation material in the central bladder lumen could be due to blood clot or bladder mass. Left extrarenal pelvis is stable and also contains high attenuation material suspicious for blood clot. Stomach/Bowel: No evidence of obstruction, inflammatory process, or abnormal fluid collections. Normal appendix visualized. Mild sigmoid diverticulosis, without signs of diverticulitis. Vascular/Lymphatic: No pathologically enlarged lymph nodes identified. No evidence of abdominal aortic aneurysm. Reproductive:  No mass or other significant abnormality. Other:  None. Musculoskeletal:  No suspicious bone lesions identified. IMPRESSION: Increased moderate to severe right hydroureteronephrosis to the level of the urinary bladder. No ureteral calculi identified. Increased right renal swelling and perinephric stranding, which may be  due to  obstructive uropathy or pyelonephritis. Nearly empty bladder is poorly evaluated, but high attenuation material in the central bladder lumen could be due to blood clot or bladder mass. Consider further evaluation with CT urogram without and with contrast. High attenuation material in the dilated right renal pelvis and left extrarenal pelvis, likely due to blood clot. Stable bilateral renal parenchymal atrophy and numerous tiny cystic lesions. Mild sigmoid diverticulosis, without radiographic evidence of diverticulitis. Electronically Signed   By: Norleen DELENA Kil M.D.   On: 05/16/2024 11:47    Scheduled Meds:  sodium chloride    Intravenous Once   atorvastatin   40 mg Oral Daily   Chlorhexidine  Gluconate Cloth  6 each Topical Q0600   cinacalcet   60 mg Oral Q breakfast   metoprolol  tartrate  5 mg Intravenous Once   metoprolol  tartrate  12.5 mg Oral BID   midodrine   5 mg Oral Q M,W,F-HD   sevelamer  carbonate  2,400 mg Oral TID WC   Continuous Infusions:  cefTRIAXone (ROCEPHIN)  IV 1 g (05/15/24 1514)     LOS: 3 days   Fredia Skeeter, MD [facility]  05/17/2024, 8:22 AM  Total time spent 55 minutes *Please note that this is a verbal dictation therefore any spelling or grammatical errors are due to the Dragon Medical One system interpretation.  Please page via Amion and do not message via secure chat for urgent patient care matters. Secure chat can be used for non urgent patient care matters.  How to contact the [facility] Attending or Consulting provider 7A - 7P or covering provider during after hours 7P -7A, for this patient?  Check the care team in [facility] and look for a) attending/consulting [facility] provider listed and b) the [facility] team listed. Page or secure chat 7A-7P. Log into [email or web address] and use [employer]'s universal password to access. If you do not have the password, please contact the hospital operator. Locate the [facility] provider you are looking for under [facility] and page to a number  that you can be directly reached. If you still have difficulty reaching the provider, please page the [facility] (Director on Call) for the Hospitalists listed on amion for assistance.

## 2024-05-17 NOTE — Telephone Encounter (Signed)
 Called office to follow up on clearance request & spoke with [facility]. She is going to send Dr. Nicholaus' nurse a message.

## 2024-05-17 NOTE — Progress Notes (Signed)
 IR consulted for renal embo. Dr. Jenna reviewed this case and does not recommend embo at this time.   Imaging does not support presence of hematoma, just stranding. It is more likely that patient is bleeding into a cyst which would not have a target for IR.  It would be possible to embolize the entire kidney but not provide a targeted approach.   Team made aware of IR recommendations.   Spoke with patient about the above. He expresses frustration about continued bleeding. Spoke to patient about his imaging and discussed features that radiologist look for to target a specific area of bleeding to embolize and that his imaging did not show these features. Spoke to him about complications related to total embolization of a kidney including post-embolization syndrome and potential that this does not stop symptoms. Shared with him that this option was not recommended by IR MD. Encouraged him that his primary team would continue to look into options for him.    Shandria Clinch NP 05/17/2024 8:51 AM

## 2024-05-17 NOTE — Progress Notes (Signed)
 [facility] Progress Note  Subjective:  Seen in room In good spirits No further SVT episodes yest evening  Vitals:   05/16/24 2311 05/17/24 0016 05/17/24 0351 05/17/24 0755  BP: 116/62  104/65 105/89  Pulse: 94  91 99  Resp: 20 18 20    Temp: 98.3 F (36.8 C) 98.6 F (37 C) 98.2 F (36.8 C) 98.3 F (36.8 C)  TempSrc: Oral Oral Oral Oral  SpO2: 100%   99%  Weight:   (!) 137.4 kg   Height:        Exam:  alert, nad   no jvd  Chest cta bilat  Cor reg no RG  Abd soft ntnd no ascites   Ext no LE edema   Alert, NF, ox3  L AVF +bruit   Home bp meds: Lopressor  12.5 bid Midodrine  5mg  pre hd mwf   OP HD: MWF NW 4.5h   B450   136kg   2K bath   AVF  Heparin  none - Mircera 150 q 2 weeks (last 9/22, due 10/06) - Calcitriol  2.37mcg PO q HD - Outpatient Hgb trends: 8.8 (9/9), 7.3 (9/17), 7.4 (9/24)    Assessment/Plan:  Gross hematuria with clots: Urology consulted. They felt that the bladder cancer plus radiation cystitis was related to his bladder cancer + hx or radiation therpay. Pt did have a perirenal RP bleed in June. Direct cystoscopy done 9/29 showed no bladder clots. Had another CT abd yesterday w/ contrast. Per pmd.  ESRD: HD is MWF. Had HD here Tuesday and Wed. Next HD Friday.   Hx SVT: had RR event yest am, rec'd IV adenosine .   BP: bp's stable 110/80 range, on metoprolol  12.5 bid and midodrine  pre HD three times per week.   Anemia esrd: Hb 7- 8 here. Next esa due 10/06, will order in case he is still here.  Metabolic bone disease: Ca ok, continue home meds. H/o R perinephric hematoma: June 2025, w/ hydronephrosis and clot in the renal pelvis. Rec'd 3u prbc's and 1g TXA.    Myer Fret MD  [facility] 05/17/2024, 8:42 AM  Recent Labs  Lab 05/15/24 1310 05/16/24 0151 05/16/24 1704 05/17/24 0316  HGB 7.8*   < > 6.8* 7.5*  ALBUMIN  3.5  --  3.1*  --   CALCIUM  9.3  --  9.4  --   PHOS 3.6  --  4.4  --   CREATININE 7.19*  --  10.93*  --   K 3.8  --  3.5   --    < > = values in this interval not displayed.   No results for input(s): IRON, TIBC, FERRITIN in the last 168 hours. Inpatient medications:  sodium chloride    Intravenous Once   atorvastatin   40 mg Oral Daily   Chlorhexidine  Gluconate Cloth  6 each Topical Q0600   cinacalcet   60 mg Oral Q breakfast   metoprolol  tartrate  5 mg Intravenous Once   metoprolol  tartrate  12.5 mg Oral BID   midodrine   5 mg Oral Q M,W,F-HD   sevelamer  carbonate  2,400 mg Oral TID WC    cefTRIAXone (ROCEPHIN)  IV 1 g (05/15/24 1514)   acetaminophen  **OR** acetaminophen , HYDROcodone -acetaminophen , ondansetron  **OR** ondansetron  (ZOFRAN ) IV, senna-docusate

## 2024-05-18 ENCOUNTER — Inpatient Hospital Stay (HOSPITAL_COMMUNITY)

## 2024-05-18 DIAGNOSIS — R310 Gross hematuria: Secondary | ICD-10-CM | POA: Diagnosis not present

## 2024-05-18 LAB — CBC WITH DIFFERENTIAL/PLATELET
Abs Immature Granulocytes: 0.03 K/uL (ref 0.00–0.07)
Basophils Absolute: 0 K/uL (ref 0.0–0.1)
Basophils Relative: 1 %
Eosinophils Absolute: 0.3 K/uL (ref 0.0–0.5)
Eosinophils Relative: 4 %
HCT: 26 % — ABNORMAL LOW (ref 39.0–52.0)
Hemoglobin: 8.3 g/dL — ABNORMAL LOW (ref 13.0–17.0)
Immature Granulocytes: 1 %
Lymphocytes Relative: 15 %
Lymphs Abs: 1 K/uL (ref 0.7–4.0)
MCH: 30.4 pg (ref 26.0–34.0)
MCHC: 31.9 g/dL (ref 30.0–36.0)
MCV: 95.2 fL (ref 80.0–100.0)
Monocytes Absolute: 1 K/uL (ref 0.1–1.0)
Monocytes Relative: 15 %
Neutro Abs: 4.4 K/uL (ref 1.7–7.7)
Neutrophils Relative %: 64 %
Platelets: 160 K/uL (ref 150–400)
RBC: 2.73 MIL/uL — ABNORMAL LOW (ref 4.22–5.81)
RDW: 17.6 % — ABNORMAL HIGH (ref 11.5–15.5)
WBC: 6.7 K/uL (ref 4.0–10.5)
nRBC: 0 % (ref 0.0–0.2)

## 2024-05-18 MED ORDER — NEPRO/CARBSTEADY PO LIQD: 237.0000 mL | ORAL | Status: DC | PRN

## 2024-05-18 MED ORDER — LIDOCAINE-PRILOCAINE 2.5-2.5 % EX CREA: 1.0000 | TOPICAL_CREAM | CUTANEOUS | Status: DC | PRN

## 2024-05-18 MED ORDER — IOHEXOL 350 MG/ML SOLN
100.0000 mL | Freq: Once | INTRAVENOUS | Status: AC | PRN
  Administered 2024-05-18: 100 mL via INTRAVENOUS

## 2024-05-18 MED ORDER — PENTAFLUOROPROP-TETRAFLUOROETH EX AERO: 1.0000 | INHALATION_SPRAY | CUTANEOUS | Status: DC | PRN

## 2024-05-18 MED ORDER — ALTEPLASE 2 MG IJ SOLR: 2.0000 mg | Freq: Once | INTRAMUSCULAR | Status: DC | PRN

## 2024-05-18 MED ORDER — DARBEPOETIN ALFA 150 MCG/0.3ML IJ SOSY
150.0000 ug | PREFILLED_SYRINGE | INTRAMUSCULAR | Status: DC
  Administered 2024-05-21: 150 ug via SUBCUTANEOUS
  Filled 2024-05-18: qty 0.3

## 2024-05-18 MED ORDER — HEPARIN SODIUM (PORCINE) 1000 UNIT/ML DIALYSIS: 1000.0000 [IU] | INTRAMUSCULAR | Status: DC | PRN

## 2024-05-18 MED ORDER — ANTICOAGULANT SODIUM CITRATE 4% (200MG/5ML) IV SOLN: 5.0000 mL | Status: DC | PRN

## 2024-05-18 MED ORDER — LIDOCAINE HCL (PF) 1 % IJ SOLN: 5.0000 mL | INTRAMUSCULAR | Status: DC | PRN

## 2024-05-18 NOTE — Plan of Care (Signed)
   Problem: Education: Goal: Knowledge of General Education information will improve Description Including pain rating scale, medication(s)/side effects and non-pharmacologic comfort measures Outcome: Progressing

## 2024-05-18 NOTE — Progress Notes (Signed)
 Hemodialysis Note:   Patient dialyzed bedside in 4E01C-01 Informed consent signed and in chart.   3 hour dialysis treatment time   Access used: Left AVF, 15gauges Access issues: None   Tolerated treatment well with uf titrations within order parameters due to soft blood pressures. Access functioned well.      Total UF removed: 1 liter Medications given:  No medications given       05/18/24 1440  Vitals  Temp 97.8 F (36.6 C)  Temp Source Oral  BP 101/62  MAP (mmHg) 74  BP Location Right Arm  BP Method Automatic  Patient Position (if appropriate) Lying  Pulse Rate 83  Pulse Rate Source Monitor  ECG Heart Rate 85  Resp 18  Weight 135.8 kg  Type of Weight Post-Dialysis  Oxygen Therapy  SpO2 100 %  O2 Device Room Air  During Treatment Monitoring  Blood Flow Rate (mL/min) 0 mL/min  Arterial Pressure (mmHg) 81.41 mmHg  Venous Pressure (mmHg) 50.1 mmHg  TMP (mmHg) 14.75 mmHg  Ultrafiltration Rate (mL/min) 534 mL/min  Dialysate Flow Rate (mL/min) 300 ml/min  Dialysate Potassium Concentration 3  Dialysate Calcium  Concentration 2.5  Duration of HD Treatment -hour(s) 3 hour(s)  Cumulative Fluid Removed (mL) per Treatment  900.09  Bolus Amount (mL) 100 mL  Post Treatment  Dialyzer Clearance Lightly streaked  Hemodialysis Intake (mL) 0 mL  Liters Processed 72  Fluid Removed (mL) 1000 mL  Tolerated HD Treatment Yes  Post-Hemodialysis Comments Tolerated treatment well  AVG/AVF Arterial Site Held (minutes) 10 minutes  AVG/AVF Venous Site Held (minutes) 10 minutes  Fistula / Graft Left Upper arm Arteriovenous fistula  Placement Date/Time: 05/14/24 0000   Placed prior to admission: Yes  Orientation: Left  Access Location: Upper arm  Access Type: Arteriovenous fistula  Site Condition No complications  Fistula / Graft Assessment Aneurysm present;Present;Thrill;Bruit  Status Deaccessed  Drainage Description None

## 2024-05-18 NOTE — Progress Notes (Signed)
 [facility] Progress Note  Subjective:   No c/o's today   Vitals:   05/18/24 0807 05/18/24 1102 05/18/24 1110 05/18/24 1133  BP: 97/64 (!) 89/74 96/66 99/68   Pulse: 90 93 89 91  Resp: 20 20 20 18   Temp: 97.9 F (36.6 C) 97.7 F (36.5 C)    TempSrc: Oral Oral    SpO2:  98% 98% 99%  Weight:  (!) 137.8 kg    Height:        Exam:  alert, nad   no jvd  Chest cta bilat  Cor reg no RG  Abd soft ntnd no ascites   Ext no LE edema   Alert, NF, ox3  L AVF +bruit   Home bp meds: Lopressor  12.5 bid Midodrine  5mg  pre hd mwf   OP HD: MWF NW 4.5h   B450   136kg   2K bath   AVF  Heparin  none - Mircera 150 q 2 weeks (last 9/22, due 10/06) - Calcitriol  2.34mcg PO q HD - Outpatient Hgb trends: 8.8 (9/9), 7.3 (9/17), 7.4 (9/24)    Assessment/Plan:  Gross hematuria with clots: complicated situation, urology consulting, also IR; appreciate assistance.  ESRD: HD MWF. Next HD today.   Hx SVT: had high HR event , rec'd IV adenosine  and rapid response.   BP: bp's stable 110/80 range, on metoprolol  12.5 bid and midodrine  pre HD three times per week.   Anemia esrd: Hb 7- 8 here. Transfusing prn. Next esa due 10/06, have ordered in case he is still here. Volume: close to dry wt, euvolemic on exam. 1-1.5 L UF goal  Metabolic bone disease: Ca ok, continue home meds. H/o R perinephric hematoma: June 2025, w/ hydronephrosis and clot in the renal pelvis. Rec'd 3u prbc's and 1g TXA.    Myer Fret MD  [facility] 05/18/2024, 11:41 AM  Recent Labs  Lab 05/15/24 1310 05/16/24 0151 05/16/24 1704 05/17/24 0316 05/18/24 0323  HGB 7.8*   < > 6.8* 7.5* 8.3*  ALBUMIN  3.5  --  3.1*  --   --   CALCIUM  9.3  --  9.4  --   --   PHOS 3.6  --  4.4  --   --   CREATININE 7.19*  --  10.93*  --   --   K 3.8  --  3.5  --   --    < > = values in this interval not displayed.   No results for input(s): IRON, TIBC, FERRITIN in the last 168 hours. Inpatient medications:  sodium chloride     Intravenous Once   atorvastatin   40 mg Oral Daily   Chlorhexidine  Gluconate Cloth  6 each Topical Q0600   Chlorhexidine  Gluconate Cloth  6 each Topical Q0600   cinacalcet   60 mg Oral Q breakfast   metoprolol  tartrate  5 mg Intravenous Once   metoprolol  tartrate  12.5 mg Oral BID   midodrine   5 mg Oral Q M,W,F-HD   sevelamer  carbonate  2,400 mg Oral TID WC    anticoagulant sodium citrate      cefTRIAXone (ROCEPHIN)  IV 1 g (05/17/24 1701)   acetaminophen  **OR** acetaminophen , alteplase , anticoagulant sodium citrate , feeding supplement (NEPRO CARB STEADY), heparin , HYDROcodone -acetaminophen , lidocaine  (PF), lidocaine -prilocaine , ondansetron  **OR** ondansetron  (ZOFRAN ) IV, pentafluoroprop-tetrafluoroeth, senna-docusate

## 2024-05-18 NOTE — Progress Notes (Addendum)
     Subjective: Spoke at length with Devin Bullock again reviewing his available options.  He denied having several of the conversations we have had during this admission, but expressed understanding this morning  Objective: Vital signs in last 24 hours: Temp:  [97.6 F (36.4 C)-98.1 F (36.7 C)] 97.7 F (36.5 C) (10/03 1102) Pulse Rate:  [74-98] 89 (10/03 1110) Resp:  [20] 20 (10/03 1110) BP: (89-103)/(55-75) 96/66 (10/03 1110) SpO2:  [96 %-100 %] 98 % (10/03 1110) Weight:  [134.2 kg-137.8 kg] 137.8 kg (10/03 1102)  Assessment/Plan: #gross hematuria, unclear source #bilateral hydronephrosis, R>L  1u PRBC on 9/30 and 10/2 respectively.  I have stopped in to speak with Devin Bullock on multiple occasions.  Unfortunately he does not recall several of the most important lengthy explanations.  In short, the proposed surgical removal of his cystoprostatectomy with bilateral nephrectomy in treatment of his high-grade cancers, are no longer available following his radiation treatment.  He is scheduled for cystoscopy with TURBT on 11/6 with Dr. Nicholaus of [facility].  We have encouraged him to transfer to [facility] in pursuit of care with his urologist.  He has declined up until today. Speaking to probability, I would be much more likely to think that his bleeding was coming from his known bladder cancer +/- radiation cystitis as opposed to both of his kidneys starting to bleed at the same time.  Patient does not seem willing to consider that blood products found in the collecting system could be refluxed and wished for further workup for renal source.  Primary team has engaged radiology who could not identify a source with A/P angiogram.  The clot material he is passing is long and stringy and has the appearance of ureteral casts.  Patient feels his bleeding is improving. Trend H&H  Addendum: CT hematuria without distinct masses. Bladder again decompressed and difficult to characterize.  Hydronephrosis bilaterally R>L.  Reviewed case and plan with Dr. Cam   Intake/Output from previous day: No intake/output data recorded.  Intake/Output this shift: No intake/output data recorded.  Physical Exam:  General: Alert and oriented CV: No cyanosis Lungs: equal chest rise Abdomen: Soft, NTND, no rebound or guarding Gu: Condom catheter in place with blood-tinged urine  Lab Results: Recent Labs    05/16/24 1704 05/17/24 0316 05/18/24 0323  HGB 6.8* 7.5* 8.3*  HCT 21.3* 23.5* 26.0*   BMET Recent Labs    05/15/24 1310 05/16/24 0151 05/16/24 1704 05/17/24 0316 05/18/24 0323  NA 135  --  133*  --   --   K 3.8  --  3.5  --   --   CL 94*  --  88*  --   --   CO2 27  --  30  --   --   GLUCOSE 116*  --  150*  --   --   BUN 25*  --  46*  --   --   CREATININE 7.19*  --  10.93*  --   --   CALCIUM  9.3  --  9.4  --   --   HGB 7.8*   < > 6.8* 7.5* 8.3*  WBC  --    < > 8.1 6.6 6.7   < > = values in this interval not displayed.     Studies/Results: No results found.    LOS: 4 days   Ole Bourdon, NP [facility] Pager: [phone]  05/18/2024, 11:17 AM

## 2024-05-18 NOTE — Progress Notes (Signed)
   05/18/24 1540  TOC Brief Assessment  Insurance and Status Reviewed  Patient has primary care physician Yes  Home environment has been reviewed home  Prior level of function: independent  Prior/Current Home Services No current home services  Social Drivers of Health Review SDOH reviewed no interventions necessary  Readmission risk has been reviewed Yes  Transition of care needs no transition of care needs at this time    Inpatient Care Management (ICM) will continue to monitor patient advancement through interdisciplinary progression rounds. If new patient transition needs arise, please place a ICM (CM/CSW) consult.

## 2024-05-18 NOTE — Progress Notes (Signed)
 PROGRESS NOTE    COTTRELL GENTLES  FMW:[id number] DOB: 1965/11/14 DOA: 05/13/2024 PCP: [facility]   Brief Narrative:  Devin Bullock is a 58 y.o. male with medical history significant for ESRD on MWF HD, prostate and bladder cancer s/p TURBT 2022, anemia of CKD, paroxysmal SVT, history of spontaneous right perinephric hematoma (June 2025) who presented to the ED for evaluation of hematuria with large blood clots for 2 days associated with suprapubic and urethral pain.  Patient was previously admitted in June for acute blood loss anemia due to spontaneous right perinephric requiring 4 unit PRBC transfusions.  He was monitored closely and did not require any other specific intervention.  Patient's primary nephrology and urology teams are with [facility] in [place].  He was previously receiving BCG treatments which were held due to ongoing urethral bleeding.  He underwent cystoscopy 04/19/2024 which showed a tumor in the bladder.  He is scheduled for TURBT on 06/21/2024.   CT abdomen/pelvis with contrast showed severe bilateral hydroureteronephrosis, unchanged from prior exam. High density material seen dependently within the bilateral renal pelves and mid right ureter, likely sediment or blood products. Chronic bladder wall thickening and perivesicular fat stranding.    Patient was given IV fentanyl  25 mcg.  EDP discussed with urology resident Dr. Mitchell and IR Dr. Hughes.  They recommended CT angio abdomen/pelvis which did not show any extravasation.  Patient was admitted to hospital service.  Assessment & Plan:   Principal Problem:   Gross hematuria Active Problems:   Bladder cancer (HCC)   Obesity, Class II, BMI 35-39.9   Anemia of chronic disease   ESRD (end stage renal disease) on dialysis [facility])   Essential hypertension   HFrEF (heart failure with reduced ejection fraction) (HCC)   Paroxysmal SVT (supraventricular tachycardia)   Hyperlipidemia  History of  prostate/bladder cancer and spontaneous right perinephric hematoma in June 2025/gross hematuria: CTA and cystoscopy unable to locate the source of bleeding, concern of ureteral source based on clot morphology and a clear bladder on cystoscopy. Patient is high risk due to history of bladder cancer. Unable to tolerate Foley placement and manual irrigation.  Per urology, they are unable to offer any other intervention to the patient and recommended that patient be transferred to [facility] or go home and follow-up with them as outpatient.  I had a lengthy discussion with the patient about this, patient refused to go home or to be transferred to [facility], he is obsessed with treating his hematuria here at [facility] and wanted to transfer his care to [facility] but [facility] is not willing to accept his care due to complex urological history.  I then ordered CT renal stone study which shows moderate to severe right hydroureteronephrosis, increased right renal swelling and perinephric stranding, high attenuation material and dilated right renal pelvis and left extrarenal pelvis likely due to blood clot and possible bladder mass which was known to us .  I consulted IR, they looked at the scan and spoke to him.  Per them, there is no target for embolization.  I had another lengthy discussion with the patient and he neither wants to get discharged, nor wants to be transferred to [facility] for urology care.  I then spoke to urology PA on 05/17/2024, they are now getting CT hematuria to see if they can find the source.   Acute blood loss anemia secondary to hematuria: patient has received total of 2 units of PRBC transfusion  this hospitalization with the last PRBC transfusion on 05/16/2024.  Hemoglobin is stable since then.  Hyperlipidemia: Continue atorvastatin .  History of SVT: Patient had 1 episode of SVT in the ED which was treated with his oral metoprolol .  I saw him earlier in the morning.  He  was doing fine.  Rapid response was called around 11:00 for SVT.  I went and saw him at the bedside.  Heart rate was around 180.  Patient had received metoprolol  about 40 minutes prior to this.  Patient was completely asymptomatic.  We gave him adenosine  6 mg.  With that, his heart rate came down to 115.  Patient remained asymptomatic.  We are transferring this patient to progressive care unit with cardiac monitoring.  His blood pressure was low with a MAP of 70, I ordered 250 cc fluid bolus and another 5 mg of IV Lopressor .  Patient has remained stable ever since.  He remains on Lopressor  12.5 mg p.o. twice daily.  We are restricted with low-dose due to him having intermittent hypotension.  Heart failure with reduced ejection fraction: Appears stable.  Continue Lopressor .  Hypotension: Patient on midodrine .  ESRD on HD: Monday Wednesday Friday schedule.  Management per nephrology.  Class II obesity: BMI almost 38.  Weight loss and diet modification counseled.  DVT prophylaxis: SCDs Start: 05/13/24 2350   Code Status: Full Code  Family Communication:  None present at bedside.  Plan of care discussed with patient in length and he/she verbalized understanding and agreed with it.  Status is: Inpatient Remains inpatient appropriate because: Still having hematuria   Estimated body mass index is 36.02 kg/m as calculated from the following:   Height as of this encounter: 6' 4 (1.93 m).   Weight as of this encounter: 134.2 kg.    Nutritional Assessment: Body mass index is 36.02 kg/m.SABRA Seen by dietician.  I agree with the assessment and plan as outlined below: Nutrition Status:        . Skin Assessment: I have examined the patient's skin and I agree with the wound assessment as performed by the wound care RN as outlined below:    Consultants:  Nephrology  Urology-signed off  Procedures:  As above  Antimicrobials:  Anti-infectives (From admission, onward)    Start     Dose/Rate  Route Frequency Ordered Stop   05/15/24 1400  cefTRIAXone (ROCEPHIN) 1 g in sodium chloride  0.9 % 100 mL IVPB       Note to Pharmacy: Please tube to 2W   1 g 200 mL/hr over 30 Minutes Intravenous Daily 05/15/24 1009           Subjective: Patient seen and examined.  Continues to complain of hematuria.  No pain or other complaint.  He is urinary bag still has a lot of blood.  Upon further questioning, he tells me that his back has not been changed or cleaned for last 1 to 2 days so it is not sure whether this blood is from last 24 hours or beyond that.  I did notice some blood in the tube as well.  Patient endorsed having intermittent mild bleeding.  Objective: Vitals:   05/17/24 1912 05/17/24 2319 05/18/24 0336 05/18/24 0807  BP: (!) 91/55 93/65 102/75 97/64  Pulse: 86 91 94 90  Resp: 20 20 20 20   Temp: 98 F (36.7 C) 97.6 F (36.4 C) 98.1 F (36.7 C) 97.9 F (36.6 C)  TempSrc: Oral Oral Oral Oral  SpO2: 96% 100% 98%  Weight:   134.2 kg   Height:       No intake or output data in the 24 hours ending 05/18/24 1030  Filed Weights   05/16/24 1844 05/17/24 0351 05/18/24 0336  Weight: (!) 138.6 kg (!) 137.4 kg 134.2 kg    Examination:  General exam: Appears calm and comfortable, obese Respiratory system: Clear to auscultation. Respiratory effort normal. Cardiovascular system: S1 & S2 heard, RRR. No JVD, murmurs, rubs, gallops or clicks. No pedal edema. Gastrointestinal system: Abdomen is nondistended, soft and nontender. No organomegaly or masses felt. Normal bowel sounds heard. Central nervous system: Alert and oriented. No focal neurological deficits. Extremities: Symmetric 5 x 5 power. Skin: No rashes, lesions or ulcers.  Psychiatry: Judgement and insight appear normal. Mood & affect appropriate.    Data Reviewed: I have personally reviewed following labs and imaging studies  CBC: Recent Labs  Lab 05/15/24 0825 05/15/24 1310 05/16/24 0151 05/16/24 1142  05/16/24 1704 05/17/24 0316 05/18/24 0323  WBC 8.2  --  9.3  --  8.1 6.6 6.7  NEUTROABS  --   --  7.3  --   --  4.6 4.4  HGB 7.2*   < > 7.3* 7.3* 6.8* 7.5* 8.3*  HCT 22.5*   < > 23.2* 22.7* 21.3* 23.5* 26.0*  MCV 97.8  --  98.3  --  98.2 95.5 95.2  PLT 164  --  188  --  174 138* 160   < > = values in this interval not displayed.   Basic Metabolic Panel: Recent Labs  Lab 05/13/24 1848 05/13/24 1854 05/14/24 0437 05/15/24 0825 05/15/24 1310 05/16/24 1704  NA 139 138 136 134* 135 133*  K 3.7 3.6 3.6 4.0 3.8 3.5  CL 93* 97* 92* 90* 94* 88*  CO2 26  --  28 27 27 30   GLUCOSE 110* 111* 162* 129* 116* 150*  BUN 42* 40* 45* 58* 25* 46*  CREATININE 10.52* 11.50* 11.79* 13.26* 7.19* 10.93*  CALCIUM  9.8  --  9.6 9.2 9.3 9.4  PHOS  --   --   --  6.1* 3.6 4.4   GFR: Estimated Creatinine Clearance: 10.8 mL/min (A) (by C-G formula based on SCr of 10.93 mg/dL (H)). Liver Function Tests: Recent Labs  Lab 05/13/24 1848 05/15/24 0825 05/15/24 1310 05/16/24 1704  AST 28  --   --   --   ALT 15  --   --   --   ALKPHOS 125  --   --   --   BILITOT 0.6  --   --   --   PROT 7.2  --   --   --   ALBUMIN  3.9 3.3* 3.5 3.1*   No results for input(s): LIPASE, AMYLASE in the last 168 hours. No results for input(s): AMMONIA in the last 168 hours. Coagulation Profile: Recent Labs  Lab 05/13/24 2205  INR 1.2   Cardiac Enzymes: No results for input(s): CKTOTAL, CKMB, CKMBINDEX, TROPONINI in the last 168 hours. BNP (last 3 results) No results for input(s): PROBNP in the last 8760 hours. HbA1C: No results for input(s): HGBA1C in the last 72 hours. CBG: No results for input(s): GLUCAP in the last 168 hours. Lipid Profile: No results for input(s): CHOL, HDL, LDLCALC, TRIG, CHOLHDL, LDLDIRECT in the last 72 hours. Thyroid  Function Tests: No results for input(s): TSH, T4TOTAL, FREET4, T3FREE, THYROIDAB in the last 72 hours. Anemia Panel: No results  for input(s): VITAMINB12, FOLATE, FERRITIN, TIBC, IRON, RETICCTPCT in the last  72 hours. Sepsis Labs: Recent Labs  Lab 05/13/24 1854  LATICACIDVEN 2.3*    No results found for this or any previous visit (from the past 240 hours).   Radiology Studies: No results found.   Scheduled Meds:  sodium chloride    Intravenous Once   atorvastatin   40 mg Oral Daily   Chlorhexidine  Gluconate Cloth  6 each Topical Q0600   Chlorhexidine  Gluconate Cloth  6 each Topical Q0600   cinacalcet   60 mg Oral Q breakfast   metoprolol  tartrate  5 mg Intravenous Once   metoprolol  tartrate  12.5 mg Oral BID   midodrine   5 mg Oral Q M,W,F-HD   sevelamer  carbonate  2,400 mg Oral TID WC   Continuous Infusions:  cefTRIAXone (ROCEPHIN)  IV 1 g (05/17/24 1701)     LOS: 4 days   Fredia Skeeter, MD [facility]  05/18/2024, 10:30 AM  Total time spent 55 minutes *Please note that this is a verbal dictation therefore any spelling or grammatical errors are due to the Dragon Medical One system interpretation.  Please page via Amion and do not message via secure chat for urgent patient care matters. Secure chat can be used for non urgent patient care matters.  How to contact the [facility] Attending or Consulting provider 7A - 7P or covering provider during after hours 7P -7A, for this patient?  Check the care team in [facility] and look for a) attending/consulting [facility] provider listed and b) the [facility] team listed. Page or secure chat 7A-7P. Log into [email or web address] and use [facility] universal password to access. If you do not have the password, please contact the hospital operator. Locate the [facility] provider you are looking for under [facility] and page to a number that you can be directly reached. If you still have difficulty reaching the provider, please page the [facility] (Director on Call) for the Hospitalists listed on amion for assistance.

## 2024-05-19 ENCOUNTER — Encounter (HOSPITAL_COMMUNITY): Payer: Self-pay | Admitting: Internal Medicine

## 2024-05-19 DIAGNOSIS — R310 Gross hematuria: Secondary | ICD-10-CM | POA: Diagnosis not present

## 2024-05-19 LAB — CBC WITH DIFFERENTIAL/PLATELET
Abs Immature Granulocytes: 0.01 K/uL (ref 0.00–0.07)
Basophils Absolute: 0 K/uL (ref 0.0–0.1)
Basophils Relative: 0 %
Eosinophils Absolute: 0.2 K/uL (ref 0.0–0.5)
Eosinophils Relative: 5 %
HCT: 23.4 % — ABNORMAL LOW (ref 39.0–52.0)
Hemoglobin: 7.5 g/dL — ABNORMAL LOW (ref 13.0–17.0)
Immature Granulocytes: 0 %
Lymphocytes Relative: 16 %
Lymphs Abs: 0.8 K/uL (ref 0.7–4.0)
MCH: 30.7 pg (ref 26.0–34.0)
MCHC: 32.1 g/dL (ref 30.0–36.0)
MCV: 95.9 fL (ref 80.0–100.0)
Monocytes Absolute: 0.7 K/uL (ref 0.1–1.0)
Monocytes Relative: 14 %
Neutro Abs: 3 K/uL (ref 1.7–7.7)
Neutrophils Relative %: 65 %
Platelets: 156 K/uL (ref 150–400)
RBC: 2.44 MIL/uL — ABNORMAL LOW (ref 4.22–5.81)
RDW: 16.9 % — ABNORMAL HIGH (ref 11.5–15.5)
WBC: 4.7 K/uL (ref 4.0–10.5)
nRBC: 0 % (ref 0.0–0.2)

## 2024-05-19 LAB — BASIC METABOLIC PANEL WITH GFR
Anion gap: 12 (ref 5–15)
BUN: 30 mg/dL — ABNORMAL HIGH (ref 6–20)
CO2: 28 mmol/L (ref 22–32)
Calcium: 8.7 mg/dL — ABNORMAL LOW (ref 8.9–10.3)
Chloride: 96 mmol/L — ABNORMAL LOW (ref 98–111)
Creatinine, Ser: 7.25 mg/dL — ABNORMAL HIGH (ref 0.61–1.24)
GFR, Estimated: 8 mL/min — ABNORMAL LOW (ref >60)
Glucose, Bld: 92 mg/dL (ref 70–99)
Potassium: 4.3 mmol/L (ref 3.5–5.1)
Sodium: 136 mmol/L (ref 135–145)

## 2024-05-19 NOTE — Progress Notes (Signed)
 [facility] Progress Note   Subjective:   Patient seen and examined at bedside.  Reports ongoing hematuria but no longer passing clots.  Lower abdominal/pelvic pain has also improved.  Denies CP, SOB, nausea, vomiting and diarrhea. States he is unsure if he is weak or dizzy because he has not stood up. No other specific complaints. Tolerated dialysis well yesterday.   Objective Vitals:   05/19/24 0350 05/19/24 0500 05/19/24 0848 05/19/24 1259  BP: (!) 95/54  (!) 101/57 102/66  Pulse: 81  84 (!) 105  Resp: 18     Temp: 98.2 F (36.8 C)  98 F (36.7 C) 97.6 F (36.4 C)  TempSrc: Oral  Oral Oral  SpO2: 100%  97% 100%  Weight:  133.5 kg    Height:       Physical Exam General:chronically ill appearing male in NAD Heart:RRR, no mrg Lungs:CTAB, nml WOB on RA Abdomen:soft, NTND Extremities:no LE edema Dialysis Access: LU AVF +b/t   Filed Weights   05/18/24 1102 05/18/24 1440 05/19/24 0500  Weight: (!) 137.3 kg 135.8 kg 133.5 kg    Intake/Output Summary (Last 24 hours) at 05/19/2024 1423 Last data filed at 05/19/2024 1030 Gross per 24 hour  Intake 920 ml  Output 1000 ml  Net -80 ml    Additional Objective Labs: Basic Metabolic Panel: Recent Labs  Lab 05/15/24 0825 05/15/24 1310 05/16/24 1704 05/19/24 0401  NA 134* 135 133* 136  K 4.0 3.8 3.5 4.3  CL 90* 94* 88* 96*  CO2 27 27 30 28   GLUCOSE 129* 116* 150* 92  BUN 58* 25* 46* 30*  CREATININE 13.26* 7.19* 10.93* 7.25*  CALCIUM  9.2 9.3 9.4 8.7*  PHOS 6.1* 3.6 4.4  --    Liver Function Tests: Recent Labs  Lab 05/13/24 1848 05/15/24 0825 05/15/24 1310 05/16/24 1704  AST 28  --   --   --   ALT 15  --   --   --   ALKPHOS 125  --   --   --   BILITOT 0.6  --   --   --   PROT 7.2  --   --   --   ALBUMIN  3.9 3.3* 3.5 3.1*   CBC: Recent Labs  Lab 05/16/24 0151 05/16/24 1142 05/16/24 1704 05/17/24 0316 05/18/24 0323 05/19/24 0401  WBC 9.3  --  8.1 6.6 6.7 4.7  NEUTROABS 7.3  --   --  4.6  4.4 3.0  HGB 7.3*   < > 6.8* 7.5* 8.3* 7.5*  HCT 23.2*   < > 21.3* 23.5* 26.0* 23.4*  MCV 98.3  --  98.2 95.5 95.2 95.9  PLT 188  --  174 138* 160 156   < > = values in this interval not displayed.    Studies/Results: CT HEMATURIA WORKUP Result Date: 05/18/2024 CLINICAL DATA:  [id number] Gross hematuria [id number] EXAM: CT ABDOMEN AND PELVIS WITHOUT AND WITH CONTRAST TECHNIQUE: Multidetector CT imaging of the abdomen and pelvis was performed following the standard protocol before and following the bolus administration of intravenous contrast. RADIATION DOSE REDUCTION: This exam was performed according to the departmental dose-optimization program which includes automated exposure control, adjustment of the mA and/or kV according to patient size and/or use of iterative reconstruction technique. CONTRAST:  OMNIPAQUE  IOHEXOL  350 MG/ML SOLN COMPARISON:  CT renal stone protocol from 05/16/2024. FINDINGS: Lower chest: The lung bases are clear. No pleural effusion. The heart is normal in size. No pericardial effusion. Hepatobiliary: The liver is  normal in size. Non-cirrhotic configuration. No suspicious mass. There are multiple scattered simple cysts throughout the liver with largest in the segment 8 measuring up to 1.7 x 2.1 cm. No intrahepatic or extrahepatic bile duct dilation. No calcified gallstones. Normal gallbladder wall thickness. No pericholecystic inflammatory changes. Pancreas: Unremarkable. No pancreatic ductal dilatation or surrounding inflammatory changes. Spleen: Within normal limits. No focal lesion. Adrenals/Urinary Tract: Adrenal glands are unremarkable. Non-contrast images: There is a 2 mm nonobstructing calculus in the left kidney interpolar region. No other radiopaque urinary tract calculi. Kidneys: There is moderate atrophy of the left kidney which exhibit innumerable cysts of varying sizes. Largest cyst is in the left kidney lower pole measuring up to 3.3 x 3.4 cm, favored to represent  proteinaceous/hemorrhagic cysts. There is mild dilation of the left renal pelvis however, left ureter is unremarkable concerning mild component of underlying UPJ obstruction. No significant interval change. Left renal pelvis exhibit hyperattenuating contents, favoring blood products. Redemonstration of moderate to severe right hydronephrosis and markedly dilated right renal pelvis containing nonenhancing hyperattenuating areas favoring blood products. Right upper ureteric is also moderately dilated and contains hyperattenuating contents, favoring blood products. However, there is gradual tapering in the right mid ureter inferior to which, right ureter is nondilated. Redemonstration of moderate right perinephric fat stranding without interval change. Urinary Tract Opacification: None, limiting the evaluation for intraluminal masses. Urinary Bladder: Minimally distended precluding optimal assessment. However, there are dependent hyperattenuating nonenhancing areas which may represent blood products. No perivesical fat stranding. No bladder calculi. Stomach/Bowel: There is a 9 x 11 mm stable, submucosal lipoma in the fundus of the stomach. No disproportionate dilation of the small or large bowel loops. No evidence of abnormal bowel wall thickening or inflammatory changes. The appendix is unremarkable. There are scattered diverticula mainly in the sigmoid colon, without imaging signs of diverticulitis. Vascular/Lymphatic: No ascites or pneumoperitoneum. No abdominal or pelvic lymphadenopathy, by size criteria. No aneurysmal dilation of the major abdominal arteries. There are mild peripheral atherosclerotic vascular calcifications of the aorta and its major branches. Reproductive: Normal size prostate. Symmetric seminal vesicles. Other: There are small fat containing umbilical and right inguinal hernias. The soft tissues and abdominal wall are otherwise unremarkable. Musculoskeletal: No suspicious osseous lesions.  Diffuse increased density of the imaged bones, concerning for underlying renal osteodystrophy. There are mild multilevel degenerative changes in the visualized spine. IMPRESSION: 1. There is moderate right hydronephrosis and markedly dilated right renal pelvis. There is gradual tapering in the right mid ureter inferior to which, right ureter is nondilated. There are hyperattenuating contents in the right renal pelvis, right upper ureter, left renal pelvis and urinary bladder, favoring blood products. No discrete mass seen. However, evaluation is limited due to underdistention and lack of opacification of the urinary bladder. 2. There is moderate atrophy of the left kidney which exhibit innumerable cysts of varying sizes. There is mild dilation of the left renal pelvis however, left ureter is unremarkable, concerning for mild underlying UPJ obstruction. No significant interval change. 3. Multiple other nonacute observations, as described above. Aortic Atherosclerosis (ICD10-I70.0). Electronically Signed   By: Ree Molt M.D.   On: 05/18/2024 12:06    Medications:  anticoagulant sodium citrate      cefTRIAXone (ROCEPHIN)  IV Stopped (05/18/24 1758)    sodium chloride    Intravenous Once   atorvastatin   40 mg Oral Daily   Chlorhexidine  Gluconate Cloth  6 each Topical Q0600   Chlorhexidine  Gluconate Cloth  6 each Topical Q0600   cinacalcet   60 mg Oral Q breakfast   [START ON 05/21/2024] darbepoetin (ARANESP ) injection - DIALYSIS  150 mcg Subcutaneous Q Mon-1800   metoprolol  tartrate  5 mg Intravenous Once   metoprolol  tartrate  12.5 mg Oral BID   midodrine   5 mg Oral Q M,W,F-HD   sevelamer  carbonate  2,400 mg Oral TID WC    Dialysis Orders:  MWF NW 4.5h   B450   136kg   2K bath   AVF  Heparin  none - Mircera 150 q 2 weeks (last 9/22, due 10/06) - Calcitriol  2.7mcg PO q HD - Outpatient Hgb trends: 8.8 (9/9), 7.3 (9/17), 7.4 (9/24)     Assessment/Plan:  Gross hematuria with clots: complicated  situation, etiology unclear. Hx of bladder and prostate Cancer.  Followed by Urology at [facility].  [facility], unable to offer intervention and recommended he [facility] or d/c home and f/u with them as outpatient. IR consulted for possible renal embolization but did not recommend embolization at this time as they do not see a specific target area to embolize. Additional work up management per PMD. ESRD: HD MWF. Next HD 05/21/24.  Hx SVT: had high HR event , rec'd IV adenosine  and rapid response. HR 90s to low 100s today.  Hypertension - BP low normal.  on metoprolol  12.5 bid and midodrine  pre HD three times per week with HD.   Acute on chronic anemia of CKD: 2/2 #1. 2 units pRBC given so far. Transfusing prn. Hgb 7.5. Next esa due 10/06, have ordered in case he is still here. Volume: Under EDW if weights correct.  Does not appear volume overloaded.  UF as tolerated.   Metabolic bone disease: Calcium  and phos in goal, continue home meds. Nutrition - renal diet w/fluid restrictions.  H/o R perinephric hematoma: June 2025, w/ hydronephrosis and clot in the renal pelvis. Rec'd 3u prbc's and 1g TXA.  Manuelita Labella, PA-C [facility] 05/19/2024,2:23 PM  LOS: 5 days

## 2024-05-19 NOTE — Progress Notes (Signed)
 PROGRESS NOTE    Devin Bullock  FMW:[id number] DOB: Jul 10, 1966 DOA: 05/13/2024 PCP: [facility]   Brief Narrative:  Devin Bullock is a 58 y.o. male with medical history significant for ESRD on MWF HD, prostate and bladder cancer s/p TURBT 2022, anemia of CKD, paroxysmal SVT, history of spontaneous right perinephric hematoma (June 2025) who presented to the ED for evaluation of hematuria with large blood clots for 2 days associated with suprapubic and urethral pain.  Patient was previously admitted in June for acute blood loss anemia due to spontaneous right perinephric requiring 4 unit PRBC transfusions.  He was monitored closely and did not require any other specific intervention.  Patient's primary nephrology and urology teams are with [facility] in [place].  He was previously receiving BCG treatments which were held due to ongoing urethral bleeding.  He underwent cystoscopy 04/19/2024 which showed a tumor in the bladder.  He is scheduled for TURBT on 06/21/2024.   CT abdomen/pelvis with contrast showed severe bilateral hydroureteronephrosis, unchanged from prior exam. High density material seen dependently within the bilateral renal pelves and mid right ureter, likely sediment or blood products. Chronic bladder wall thickening and perivesicular fat stranding.    Assessment & Plan:   Principal Problem:   Gross hematuria Active Problems:   Bladder cancer (HCC)   Obesity, Class II, BMI 35-39.9   Anemia of chronic disease   ESRD (end stage renal disease) on dialysis [facility])   Essential hypertension   HFrEF (heart failure with reduced ejection fraction) (HCC)   Paroxysmal SVT (supraventricular tachycardia)   Hyperlipidemia   >>Gross hematuria, ongoing  >>history of prostate/bladder cancer and spontaneous right perinephric hematoma in June 2025/gross hematuria: CTA and cystoscopy unable to locate the source of bleeding, concern of ureteral source based on clot  morphology and a clear bladder on cystoscopy. Patient is high risk due to history of bladder cancer. Unable to tolerate Foley placement and manual irrigation.  Per urology, they are unable to offer any other intervention to the patient and recommended that patient be transferred to [facility] or go home and follow-up with them as outpatient. - Patient has declined transfer to [facility].  He is also not ready for discharge home because he continues to have bloody urine.  Hemoglobin has trended down from yesterday 8.3-7.5 today. -Will repeat hemoglobin in the AM.  Continue to monitor for gross hematuria - Continue IV ceftriaxone.  No cultures collected.  Acute blood loss anemia secondary to hematuria: patient has received total of 2 units of PRBC transfusion this hospitalization with the last PRBC transfusion on 05/16/2024.  Hemoglobin between 7-8.  Dropped from 8.3-7.5 overnight.    Hyperlipidemia: Continue atorvastatin .  History of SVT: Patient had 1 episode of SVT in the ED which was treated with his oral metoprolol .  Currently tolerating metoprolol  12.5 twice daily.  Heart failure with reduced ejection fraction: Appears stable.  Continue Lopressor .  Hypotension: Patient on midodrine .  ESRD on HD: Monday Wednesday Friday schedule.  Management per nephrology.  Class II obesity: BMI almost 38.  Weight loss and diet modification counseled.  DVT prophylaxis: SCDs Start: 05/13/24 2350   Code Status: Full Code  Family Communication:  None present at bedside.  Plan of care discussed with patient in length and he/she verbalized understanding and agreed with it.  Status is: Inpatient Remains inpatient appropriate because: Still having hematuria   Estimated body mass index is 35.83 kg/m as calculated from the following:  Height as of this encounter: 6' 4 (1.93 m).   Weight as of this encounter: 133.5 kg.    Nutritional Assessment: Body mass index is 35.83 kg/m.SABRA Seen by dietician.   I agree with the assessment and plan as outlined below: Nutrition Status:        . Skin Assessment: I have examined the patient's skin and I agree with the wound assessment as performed by the wound care RN as outlined below:    Consultants:  Nephrology  Urology-signed off  Procedures:  As above  Antimicrobials:  Anti-infectives (From admission, onward)    Start     Dose/Rate Route Frequency Ordered Stop   05/15/24 1400  cefTRIAXone (ROCEPHIN) 1 g in sodium chloride  0.9 % 100 mL IVPB       Note to Pharmacy: Please tube to 2W   1 g 200 mL/hr over 30 Minutes Intravenous Daily 05/15/24 1009           Subjective: Patient seen and examined.  Patient continues to have ongoing gross hematuria.  He says he does not have pain like it was before but he still has gross blood in the urine.  He also reports of some right flank pain.  He expressed concern that we do not have diagnosis yet objective: Vitals:   05/19/24 0350 05/19/24 0500 05/19/24 0848 05/19/24 1259  BP: (!) 95/54  (!) 101/57 102/66  Pulse: 81  84 (!) 105  Resp: 18     Temp: 98.2 F (36.8 C)  98 F (36.7 C) 97.6 F (36.4 C)  TempSrc: Oral  Oral Oral  SpO2: 100%  97% 100%  Weight:  133.5 kg    Height:        Intake/Output Summary (Last 24 hours) at 05/19/2024 1413 Last data filed at 05/19/2024 1030 Gross per 24 hour  Intake 920 ml  Output 1000 ml  Net -80 ml    Filed Weights   05/18/24 1102 05/18/24 1440 05/19/24 0500  Weight: (!) 137.3 kg 135.8 kg 133.5 kg    Examination:  General exam: Appears calm and comfortable, obese Respiratory system: Clear to auscultation. Respiratory effort normal. Cardiovascular system: S1 & S2 heard, RRR. No JVD, murmurs, rubs, gallops or clicks. No pedal edema. Gastrointestinal system: Abdomen is nondistended, right flank tenderness+ Central nervous system: Alert and oriented. No focal neurological deficits. Extremities: Symmetric 5 x 5 power. Skin: No rashes, lesions  or ulcers.  Psychiatry: Judgement and insight appear normal. Mood & affect appropriate.    Data Reviewed: I have personally reviewed following labs and imaging studies  CBC: Recent Labs  Lab 05/16/24 0151 05/16/24 1142 05/16/24 1704 05/17/24 0316 05/18/24 0323 05/19/24 0401  WBC 9.3  --  8.1 6.6 6.7 4.7  NEUTROABS 7.3  --   --  4.6 4.4 3.0  HGB 7.3* 7.3* 6.8* 7.5* 8.3* 7.5*  HCT 23.2* 22.7* 21.3* 23.5* 26.0* 23.4*  MCV 98.3  --  98.2 95.5 95.2 95.9  PLT 188  --  174 138* 160 156   Basic Metabolic Panel: Recent Labs  Lab 05/14/24 0437 05/15/24 0825 05/15/24 1310 05/16/24 1704 05/19/24 0401  NA 136 134* 135 133* 136  K 3.6 4.0 3.8 3.5 4.3  CL 92* 90* 94* 88* 96*  CO2 28 27 27 30 28   GLUCOSE 162* 129* 116* 150* 92  BUN 45* 58* 25* 46* 30*  CREATININE 11.79* 13.26* 7.19* 10.93* 7.25*  CALCIUM  9.6 9.2 9.3 9.4 8.7*  PHOS  --  6.1* 3.6 4.4  --  GFR: Estimated Creatinine Clearance: 16.2 mL/min (A) (by C-G formula based on SCr of 7.25 mg/dL (H)). Liver Function Tests: Recent Labs  Lab 05/13/24 1848 05/15/24 0825 05/15/24 1310 05/16/24 1704  AST 28  --   --   --   ALT 15  --   --   --   ALKPHOS 125  --   --   --   BILITOT 0.6  --   --   --   PROT 7.2  --   --   --   ALBUMIN  3.9 3.3* 3.5 3.1*   No results for input(s): LIPASE, AMYLASE in the last 168 hours. No results for input(s): AMMONIA in the last 168 hours. Coagulation Profile: Recent Labs  Lab 05/13/24 2205  INR 1.2   Cardiac Enzymes: No results for input(s): CKTOTAL, CKMB, CKMBINDEX, TROPONINI in the last 168 hours. BNP (last 3 results) No results for input(s): PROBNP in the last 8760 hours. HbA1C: No results for input(s): HGBA1C in the last 72 hours. CBG: No results for input(s): GLUCAP in the last 168 hours. Lipid Profile: No results for input(s): CHOL, HDL, LDLCALC, TRIG, CHOLHDL, LDLDIRECT in the last 72 hours. Thyroid  Function Tests: No results for input(s):  TSH, T4TOTAL, FREET4, T3FREE, THYROIDAB in the last 72 hours. Anemia Panel: No results for input(s): VITAMINB12, FOLATE, FERRITIN, TIBC, IRON, RETICCTPCT in the last 72 hours. Sepsis Labs: Recent Labs  Lab 05/13/24 1854  LATICACIDVEN 2.3*    No results found for this or any previous visit (from the past 240 hours).   Radiology Studies: CT HEMATURIA WORKUP Result Date: 05/18/2024 CLINICAL DATA:  [id number] Gross hematuria [id number] EXAM: CT ABDOMEN AND PELVIS WITHOUT AND WITH CONTRAST TECHNIQUE: Multidetector CT imaging of the abdomen and pelvis was performed following the standard protocol before and following the bolus administration of intravenous contrast. RADIATION DOSE REDUCTION: This exam was performed according to the departmental dose-optimization program which includes automated exposure control, adjustment of the mA and/or kV according to patient size and/or use of iterative reconstruction technique. CONTRAST:  OMNIPAQUE  IOHEXOL  350 MG/ML SOLN COMPARISON:  CT renal stone protocol from 05/16/2024. FINDINGS: Lower chest: The lung bases are clear. No pleural effusion. The heart is normal in size. No pericardial effusion. Hepatobiliary: The liver is normal in size. Non-cirrhotic configuration. No suspicious mass. There are multiple scattered simple cysts throughout the liver with largest in the segment 8 measuring up to 1.7 x 2.1 cm. No intrahepatic or extrahepatic bile duct dilation. No calcified gallstones. Normal gallbladder wall thickness. No pericholecystic inflammatory changes. Pancreas: Unremarkable. No pancreatic ductal dilatation or surrounding inflammatory changes. Spleen: Within normal limits. No focal lesion. Adrenals/Urinary Tract: Adrenal glands are unremarkable. Non-contrast images: There is a 2 mm nonobstructing calculus in the left kidney interpolar region. No other radiopaque urinary tract calculi. Kidneys: There is moderate atrophy of the left kidney which  exhibit innumerable cysts of varying sizes. Largest cyst is in the left kidney lower pole measuring up to 3.3 x 3.4 cm, favored to represent proteinaceous/hemorrhagic cysts. There is mild dilation of the left renal pelvis however, left ureter is unremarkable concerning mild component of underlying UPJ obstruction. No significant interval change. Left renal pelvis exhibit hyperattenuating contents, favoring blood products. Redemonstration of moderate to severe right hydronephrosis and markedly dilated right renal pelvis containing nonenhancing hyperattenuating areas favoring blood products. Right upper ureteric is also moderately dilated and contains hyperattenuating contents, favoring blood products. However, there is gradual tapering in the right mid ureter inferior to which,  right ureter is nondilated. Redemonstration of moderate right perinephric fat stranding without interval change. Urinary Tract Opacification: None, limiting the evaluation for intraluminal masses. Urinary Bladder: Minimally distended precluding optimal assessment. However, there are dependent hyperattenuating nonenhancing areas which may represent blood products. No perivesical fat stranding. No bladder calculi. Stomach/Bowel: There is a 9 x 11 mm stable, submucosal lipoma in the fundus of the stomach. No disproportionate dilation of the small or large bowel loops. No evidence of abnormal bowel wall thickening or inflammatory changes. The appendix is unremarkable. There are scattered diverticula mainly in the sigmoid colon, without imaging signs of diverticulitis. Vascular/Lymphatic: No ascites or pneumoperitoneum. No abdominal or pelvic lymphadenopathy, by size criteria. No aneurysmal dilation of the major abdominal arteries. There are mild peripheral atherosclerotic vascular calcifications of the aorta and its major branches. Reproductive: Normal size prostate. Symmetric seminal vesicles. Other: There are small fat containing umbilical and  right inguinal hernias. The soft tissues and abdominal wall are otherwise unremarkable. Musculoskeletal: No suspicious osseous lesions. Diffuse increased density of the imaged bones, concerning for underlying renal osteodystrophy. There are mild multilevel degenerative changes in the visualized spine. IMPRESSION: 1. There is moderate right hydronephrosis and markedly dilated right renal pelvis. There is gradual tapering in the right mid ureter inferior to which, right ureter is nondilated. There are hyperattenuating contents in the right renal pelvis, right upper ureter, left renal pelvis and urinary bladder, favoring blood products. No discrete mass seen. However, evaluation is limited due to underdistention and lack of opacification of the urinary bladder. 2. There is moderate atrophy of the left kidney which exhibit innumerable cysts of varying sizes. There is mild dilation of the left renal pelvis however, left ureter is unremarkable, concerning for mild underlying UPJ obstruction. No significant interval change. 3. Multiple other nonacute observations, as described above. Aortic Atherosclerosis (ICD10-I70.0). Electronically Signed   By: Ree Molt M.D.   On: 05/18/2024 12:06     Scheduled Meds:  sodium chloride    Intravenous Once   atorvastatin   40 mg Oral Daily   Chlorhexidine  Gluconate Cloth  6 each Topical Q0600   Chlorhexidine  Gluconate Cloth  6 each Topical Q0600   cinacalcet   60 mg Oral Q breakfast   [START ON 05/21/2024] darbepoetin (ARANESP ) injection - DIALYSIS  150 mcg Subcutaneous Q Mon-1800   metoprolol  tartrate  5 mg Intravenous Once   metoprolol  tartrate  12.5 mg Oral BID   midodrine   5 mg Oral Q M,W,F-HD   sevelamer  carbonate  2,400 mg Oral TID WC   Continuous Infusions:  anticoagulant sodium citrate      cefTRIAXone (ROCEPHIN)  IV Stopped (05/18/24 1758)     LOS: 5 days   Emslee Lopezmartinez, MD [facility]  05/19/2024, 2:13 PM  Total time spent 55 minutes *Please  note that this is a verbal dictation therefore any spelling or grammatical errors are due to the Dragon [employer] One system interpretation.  Please page via Amion and do not message via secure chat for urgent patient care matters. Secure chat can be used for non urgent patient care matters.  How to contact the [facility] Attending or Consulting provider 7A - 7P or covering provider during after hours 7P -7A, for this patient?  Check the care team in [facility] and look for a) attending/consulting [facility] provider listed and b) the [facility] team listed. Page or secure chat 7A-7P. Log into [email or web address] and use [facility] universal password to access. If you do not have the password, please contact the hospital operator.  Locate the [facility] provider you are looking for under [facility] and page to a number that you can be directly reached. If you still have difficulty reaching the provider, please page the [facility] (Director on Call) for the Hospitalists listed on amion for assistance.

## 2024-05-20 DIAGNOSIS — R310 Gross hematuria: Secondary | ICD-10-CM | POA: Diagnosis not present

## 2024-05-20 LAB — HEMOGLOBIN AND HEMATOCRIT, BLOOD
HCT: 21.7 % — ABNORMAL LOW (ref 39.0–52.0)
HCT: 23.1 % — ABNORMAL LOW (ref 39.0–52.0)
Hemoglobin: 6.9 g/dL — CL (ref 13.0–17.0)
Hemoglobin: 7.5 g/dL — ABNORMAL LOW (ref 13.0–17.0)

## 2024-05-20 LAB — HEMOGLOBIN A1C
Hgb A1c MFr Bld: 4.4 % — ABNORMAL LOW (ref 4.8–5.6)
Mean Plasma Glucose: 79.58 mg/dL

## 2024-05-20 LAB — PREPARE RBC (CROSSMATCH)

## 2024-05-20 MED ORDER — SODIUM CHLORIDE 0.9% IV SOLUTION: Freq: Once | INTRAVENOUS | Status: AC

## 2024-05-20 MED ORDER — CHLORHEXIDINE GLUCONATE CLOTH 2 % EX PADS
6.0000 | MEDICATED_PAD | Freq: Every day | CUTANEOUS | Status: DC
  Administered 2024-05-21 – 2024-05-24 (×2): 6 via TOPICAL

## 2024-05-20 MED ORDER — BISACODYL 10 MG RE SUPP
10.0000 mg | Freq: Once | RECTAL | Status: AC
Start: 2024-05-21 — End: 2024-05-20
  Administered 2024-05-20: 10 mg via RECTAL
  Filled 2024-05-20: qty 1

## 2024-05-20 NOTE — Progress Notes (Signed)
 [facility] Progress Note   Subjective:   Patient seen and examined at bedside.  Continues to have hematuria.  No other specific complaints.  Tolerating dialysis well.   Objective Vitals:   05/20/24 0035 05/20/24 0336 05/20/24 0819 05/20/24 1159  BP: 98/60 96/70 113/67 (!) 102/50  Pulse: 84 73 88 88  Resp: 16 18  18   Temp: 98.1 F (36.7 C) 98.3 F (36.8 C)  98.3 F (36.8 C)  TempSrc: Oral Oral  Oral  SpO2: 96% 96%  90%  Weight:  135 kg    Height:       Physical Exam General:well appearing male in NAD Heart:RRR Lungs:nml WOB on RA Abdomen:soft, NTND Extremities:no LE edema Dialysis Access: LU AVF +b/t   Filed Weights   05/18/24 1440 05/19/24 0500 05/20/24 0336  Weight: 135.8 kg 133.5 kg 135 kg    Intake/Output Summary (Last 24 hours) at 05/20/2024 1239 Last data filed at 05/19/2024 1928 Gross per 24 hour  Intake 100 ml  Output --  Net 100 ml    Additional Objective Labs: Basic Metabolic Panel: Recent Labs  Lab 05/15/24 0825 05/15/24 1310 05/16/24 1704 05/19/24 0401  NA 134* 135 133* 136  K 4.0 3.8 3.5 4.3  CL 90* 94* 88* 96*  CO2 27 27 30 28   GLUCOSE 129* 116* 150* 92  BUN 58* 25* 46* 30*  CREATININE 13.26* 7.19* 10.93* 7.25*  CALCIUM  9.2 9.3 9.4 8.7*  PHOS 6.1* 3.6 4.4  --    Liver Function Tests: Recent Labs  Lab 05/13/24 1848 05/15/24 0825 05/15/24 1310 05/16/24 1704  AST 28  --   --   --   ALT 15  --   --   --   ALKPHOS 125  --   --   --   BILITOT 0.6  --   --   --   PROT 7.2  --   --   --   ALBUMIN  3.9 3.3* 3.5 3.1*   No results for input(s): LIPASE, AMYLASE in the last 168 hours. CBC: Recent Labs  Lab 05/16/24 0151 05/16/24 1142 05/16/24 1704 05/17/24 0316 05/18/24 0323 05/19/24 0401 05/20/24 1127  WBC 9.3  --  8.1 6.6 6.7 4.7  --   NEUTROABS 7.3  --   --  4.6 4.4 3.0  --   HGB 7.3*   < > 6.8* 7.5* 8.3* 7.5* 6.9*  HCT 23.2*   < > 21.3* 23.5* 26.0* 23.4* 21.7*  MCV 98.3  --  98.2 95.5 95.2 95.9  --   PLT  188  --  174 138* 160 156  --    < > = values in this interval not displayed.    Medications:  anticoagulant sodium citrate      cefTRIAXone (ROCEPHIN)  IV Stopped (05/19/24 1823)    sodium chloride    Intravenous Once   sodium chloride    Intravenous Once   atorvastatin   40 mg Oral Daily   cinacalcet   60 mg Oral Q breakfast   [START ON 05/21/2024] darbepoetin (ARANESP ) injection - DIALYSIS  150 mcg Subcutaneous Q Mon-1800   metoprolol  tartrate  5 mg Intravenous Once   metoprolol  tartrate  12.5 mg Oral BID   midodrine   5 mg Oral Q M,W,F-HD   sevelamer  carbonate  2,400 mg Oral TID WC    Dialysis Orders: MWF NW 4.5h   B450   136kg   2K bath   AVF  Heparin  none - Mircera 150 q 2 weeks (last 9/22, due  10/06) - Calcitriol  2.25mcg PO q HD - Outpatient Hgb trends: 8.8 (9/9), 7.3 (9/17), 7.4 (9/24)     Assessment/Plan:  Gross hematuria with clots: complicated situation, etiology unclear. Hx of bladder and prostate Cancer.  Followed by Urology at [facility].  [facility], unable to offer intervention and recommended he transfer to [facility] or d/c home and f/u with them as outpatient. IR consulted for possible renal embolization but did not recommend embolization at this time as they do not see a specific target area to embolize. Additional work up management per PMD. ESRD: HD MWF. Next HD 05/21/24.  Hx SVT: had high HR event , rec'd IV adenosine  and rapid response. HR 90s to low 100s today.  Hypertension - BP low normal.  on metoprolol  12.5 bid and midodrine  pre HD three times per week with HD.   Acute on chronic anemia of CKD: 2/2 #1. 2 units pRBC given so far. Transfusing prn. Hgb 6.9 today, 1 unit pRBC ordered. Next esa due 10/6. Volume: Under EDW if weights correct.  Does not appear volume overloaded.  UF as tolerated.   Metabolic bone disease: Calcium  and phos in goal, continue home meds. Nutrition - renal diet w/fluid restrictions.  H/o R perinephric hematoma: June 2025, w/ hydronephrosis and  clot in the renal pelvis. Rec'd 3u prbc's and 1g TXA  Manuelita Labella, PA-C [facility] 05/20/2024,12:39 PM  LOS: 6 days

## 2024-05-20 NOTE — Progress Notes (Signed)
 PROGRESS NOTE    Devin Bullock  FMW:[id number] DOB: 07-08-1966 DOA: 05/13/2024 PCP: [facility]   Brief Narrative:  Devin Bullock is a 58 y.o. male with medical history significant for ESRD on MWF HD, prostate and bladder cancer s/p TURBT 2022, anemia of CKD, paroxysmal SVT, history of spontaneous right perinephric hematoma (June 2025) who presented to the ED for evaluation of hematuria with large blood clots for 2 days associated with suprapubic and urethral pain.  Patient was previously admitted in June for acute blood loss anemia due to spontaneous right perinephric requiring 4 unit PRBC transfusions.  He was monitored closely and did not require any other specific intervention.  Patient's primary nephrology and urology teams are with [facility] in [place].  He was previously receiving BCG treatments which were held due to ongoing urethral bleeding.  He underwent cystoscopy 04/19/2024 which showed a tumor in the bladder.  He is scheduled for TURBT on 06/21/2024.   CT abdomen/pelvis with contrast showed severe bilateral hydroureteronephrosis, unchanged from prior exam. High density material seen dependently within the bilateral renal pelves and mid right ureter, likely sediment or blood products. Chronic bladder wall thickening and perivesicular fat stranding.    Assessment & Plan:   Principal Problem:   Gross hematuria Active Problems:   Bladder cancer (HCC)   Obesity, Class II, BMI 35-39.9   Anemia of chronic disease   ESRD (end stage renal disease) on dialysis [facility])   Essential hypertension   HFrEF (heart failure with reduced ejection fraction) (HCC)   Paroxysmal SVT (supraventricular tachycardia)   Hyperlipidemia   >>Gross hematuria, ongoing  >>history of prostate/bladder cancer and spontaneous right perinephric hematoma in June 2025/gross hematuria: CTA and cystoscopy unable to locate the source of bleeding, concern of ureteral source based on clot  morphology and a clear bladder on cystoscopy. Patient is high risk due to history of bladder cancer. Unable to tolerate Foley placement and manual irrigation.  Per urology, they are unable to offer any other intervention to the patient and recommended that patient be transferred to [facility] or go home and follow-up with them as outpatient. - Patient has declined transfer to [facility].  He is also not ready for discharge home because he continues to have bloody urine.  Hemoglobin has trended down from yesterday from 8.3-7.5-6.9.  Will need to engage urology for any recommendations if ongoing bleeding. - Plan for 1 unit PRBC transfusion today.  Will repeat hemoglobin in the AM.  Continue to monitor for gross hematuria - Continue IV ceftriaxone.  No cultures collected.  Plan for 5 days of treatment.  Acute blood loss anemia secondary to hematuria: patient has received total of 2 units of PRBC transfusion this hospitalization with the last PRBC transfusion on 05/16/2024.  Hemoglobin has trended down to 6.9 with continuous gross hematuria.  No clots.  Will transfuse 1 unit PRBC.  Repeat hemoglobin in the AM.  Hyperlipidemia: Continue atorvastatin .  History of SVT: Patient had 1 episode of SVT in the ED which was treated with his oral metoprolol .  Currently tolerating metoprolol  12.5 twice daily.  Heart failure with reduced ejection fraction: Appears stable.  Continue Lopressor .  Hypotension: Patient on midodrine .  ESRD on HD: Monday Wednesday Friday schedule.  Management per nephrology.  Class II obesity: BMI almost 38.  Weight loss and diet modification counseled.  DVT prophylaxis: SCDs Start: 05/13/24 2350   Code Status: Full Code  Family Communication:  None present at bedside.  Plan of care discussed with patient in length and he/she verbalized understanding and agreed with it.  Status is: Inpatient Remains inpatient appropriate because: Still having hematuria   Estimated body mass  index is 36.23 kg/m as calculated from the following:   Height as of this encounter: 6' 4 (1.93 m).   Weight as of this encounter: 135 kg.    Nutritional Assessment: Body mass index is 36.23 kg/m.SABRA Seen by dietician.  I agree with the assessment and plan as outlined below: Nutrition Status:       Consultants:  Nephrology  Urology-signed off  Procedures:  As above  Antimicrobials:  Anti-infectives (From admission, onward)    Start     Dose/Rate Route Frequency Ordered Stop   05/15/24 1400  cefTRIAXone (ROCEPHIN) 1 g in sodium chloride  0.9 % 100 mL IVPB       Note to Pharmacy: Please tube to 2W   1 g 200 mL/hr over 30 Minutes Intravenous Daily 05/15/24 1009           Subjective: Patient seen and examined.  Patient continues to have o gross hematuria but does not have any pain anymore.  Does not have any clots.  Globin has dropped to 6.9 and planning for blood transfusion today.  Vital signs are stable.  Again discussed about urology recommendations but he does not want to be transferred to [facility].  Objective: Vitals:   05/20/24 0336 05/20/24 0819 05/20/24 1159 05/20/24 1454  BP: 96/70 113/67 (!) 102/50 94/64  Pulse: 73 88 88 86  Resp: 18  18 18   Temp: 98.3 F (36.8 C)  98.3 F (36.8 C) 98.2 F (36.8 C)  TempSrc: Oral  Oral Oral  SpO2: 96%  90% 95%  Weight: 135 kg     Height:        Intake/Output Summary (Last 24 hours) at 05/20/2024 1504 Last data filed at 05/19/2024 1928 Gross per 24 hour  Intake 100 ml  Output --  Net 100 ml    Filed Weights   05/18/24 1440 05/19/24 0500 05/20/24 0336  Weight: 135.8 kg 133.5 kg 135 kg    Examination:  General exam: Appears calm and comfortable, obese Respiratory system: Clear to auscultation. Respiratory effort normal. Cardiovascular system: S1 & S2 heard, RRR. No JVD, murmurs, rubs, gallops or clicks. No pedal edema. Gastrointestinal system: Abdomen is nondistended, right flank tenderness+ Central nervous  system: Alert and oriented. No focal neurological deficits. Extremities: Symmetric 5 x 5 power. Skin: No rashes, lesions or ulcers.  Psychiatry: Judgement and insight appear normal. Mood & affect appropriate.    Data Reviewed: I have personally reviewed following labs and imaging studies  CBC: Recent Labs  Lab 05/16/24 0151 05/16/24 1142 05/16/24 1704 05/17/24 0316 05/18/24 0323 05/19/24 0401 05/20/24 1127  WBC 9.3  --  8.1 6.6 6.7 4.7  --   NEUTROABS 7.3  --   --  4.6 4.4 3.0  --   HGB 7.3*   < > 6.8* 7.5* 8.3* 7.5* 6.9*  HCT 23.2*   < > 21.3* 23.5* 26.0* 23.4* 21.7*  MCV 98.3  --  98.2 95.5 95.2 95.9  --   PLT 188  --  174 138* 160 156  --    < > = values in this interval not displayed.   Basic Metabolic Panel: Recent Labs  Lab 05/14/24 0437 05/15/24 0825 05/15/24 1310 05/16/24 1704 05/19/24 0401  NA 136 134* 135 133* 136  K 3.6 4.0 3.8 3.5 4.3  CL  92* 90* 94* 88* 96*  CO2 28 27 27 30 28   GLUCOSE 162* 129* 116* 150* 92  BUN 45* 58* 25* 46* 30*  CREATININE 11.79* 13.26* 7.19* 10.93* 7.25*  CALCIUM  9.6 9.2 9.3 9.4 8.7*  PHOS  --  6.1* 3.6 4.4  --    GFR: Estimated Creatinine Clearance: 16.3 mL/min (A) (by C-G formula based on SCr of 7.25 mg/dL (H)). Liver Function Tests: Recent Labs  Lab 05/13/24 1848 05/15/24 0825 05/15/24 1310 05/16/24 1704  AST 28  --   --   --   ALT 15  --   --   --   ALKPHOS 125  --   --   --   BILITOT 0.6  --   --   --   PROT 7.2  --   --   --   ALBUMIN  3.9 3.3* 3.5 3.1*   No results for input(s): LIPASE, AMYLASE in the last 168 hours. No results for input(s): AMMONIA in the last 168 hours. Coagulation Profile: Recent Labs  Lab 05/13/24 2205  INR 1.2   Cardiac Enzymes: No results for input(s): CKTOTAL, CKMB, CKMBINDEX, TROPONINI in the last 168 hours. BNP (last 3 results) No results for input(s): PROBNP in the last 8760 hours. HbA1C: Recent Labs    05/20/24 0245  HGBA1C 4.4*   CBG: No results for  input(s): GLUCAP in the last 168 hours. Lipid Profile: No results for input(s): CHOL, HDL, LDLCALC, TRIG, CHOLHDL, LDLDIRECT in the last 72 hours. Thyroid  Function Tests: No results for input(s): TSH, T4TOTAL, FREET4, T3FREE, THYROIDAB in the last 72 hours. Anemia Panel: No results for input(s): VITAMINB12, FOLATE, FERRITIN, TIBC, IRON, RETICCTPCT in the last 72 hours. Sepsis Labs: Recent Labs  Lab 05/13/24 1854  LATICACIDVEN 2.3*    No results found for this or any previous visit (from the past 240 hours).   Radiology Studies: No results found.    Scheduled Meds:  sodium chloride    Intravenous Once   sodium chloride    Intravenous Once   atorvastatin   40 mg Oral Daily   [START ON 05/21/2024] Chlorhexidine  Gluconate Cloth  6 each Topical Q0600   cinacalcet   60 mg Oral Q breakfast   [START ON 05/21/2024] darbepoetin (ARANESP ) injection - DIALYSIS  150 mcg Subcutaneous Q Mon-1800   metoprolol  tartrate  5 mg Intravenous Once   metoprolol  tartrate  12.5 mg Oral BID   midodrine   5 mg Oral Q M,W,F-HD   sevelamer  carbonate  2,400 mg Oral TID WC   Continuous Infusions:  cefTRIAXone (ROCEPHIN)  IV Stopped (05/19/24 1823)     LOS: 6 days   Ladene Allocca, MD [facility]  05/20/2024, 3:04 PM  Total time spent 55 minutes *Please note that this is a verbal dictation therefore any spelling or grammatical errors are due to the Dragon Medical One system interpretation.  Please page via Amion and do not message via secure chat for urgent patient care matters. Secure chat can be used for non urgent patient care matters.  How to contact the TRH Attending or Consulting provider 7A - 7P or covering provider during after hours 7P -7A, for this patient?  Check the care team in [facility] and look for a) attending/consulting TRH provider listed and b) the TRH team listed. Page or secure chat 7A-7P. Log into [email or web address] and use [place]'s universal  password to access. If you do not have the password, please contact the hospital operator. Locate the TRH provider you are looking for under  [facility] and page to a number that you can be directly reached. If you still have difficulty reaching the provider, please page the [facility] (Director on Call) for the Hospitalists listed on amion for assistance.

## 2024-05-21 DIAGNOSIS — R310 Gross hematuria: Secondary | ICD-10-CM | POA: Diagnosis not present

## 2024-05-21 LAB — CBC
HCT: 22.5 % — ABNORMAL LOW (ref 39.0–52.0)
Hemoglobin: 7.4 g/dL — ABNORMAL LOW (ref 13.0–17.0)
MCH: 30.7 pg (ref 26.0–34.0)
MCHC: 32.9 g/dL (ref 30.0–36.0)
MCV: 93.4 fL (ref 80.0–100.0)
Platelets: 145 K/uL — ABNORMAL LOW (ref 150–400)
RBC: 2.41 MIL/uL — ABNORMAL LOW (ref 4.22–5.81)
RDW: 16.1 % — ABNORMAL HIGH (ref 11.5–15.5)
WBC: 5.5 K/uL (ref 4.0–10.5)
nRBC: 0 % (ref 0.0–0.2)

## 2024-05-21 LAB — TYPE AND SCREEN
ABO/RH(D): O POS
Antibody Screen: NEGATIVE
Unit division: 0

## 2024-05-21 LAB — RENAL FUNCTION PANEL
Albumin: 2.7 g/dL — ABNORMAL LOW (ref 3.5–5.0)
Anion gap: 16 — ABNORMAL HIGH (ref 5–15)
BUN: 49 mg/dL — ABNORMAL HIGH (ref 6–20)
CO2: 23 mmol/L (ref 22–32)
Calcium: 8.6 mg/dL — ABNORMAL LOW (ref 8.9–10.3)
Chloride: 93 mmol/L — ABNORMAL LOW (ref 98–111)
Creatinine, Ser: 11.71 mg/dL — ABNORMAL HIGH (ref 0.61–1.24)
GFR, Estimated: 4 mL/min — ABNORMAL LOW (ref >60)
Glucose, Bld: 94 mg/dL (ref 70–99)
Phosphorus: 5.8 mg/dL — ABNORMAL HIGH (ref 2.5–4.6)
Potassium: 4 mmol/L (ref 3.5–5.1)
Sodium: 132 mmol/L — ABNORMAL LOW (ref 135–145)

## 2024-05-21 LAB — BPAM RBC
Blood Product Expiration Date: 202510302359
ISSUE DATE / TIME: 202510051443
Unit Type and Rh: 5100

## 2024-05-21 MED ORDER — MIDODRINE HCL 5 MG PO TABS
ORAL_TABLET | ORAL | Status: AC
  Filled 2024-05-21: qty 1

## 2024-05-21 MED ORDER — HYDROCORTISONE ACETATE 25 MG RE SUPP
25.0000 mg | Freq: Two times a day (BID) | RECTAL | Status: DC
Start: 2024-05-21 — End: 2024-05-25
  Administered 2024-05-21 – 2024-05-24 (×4): 25 mg via RECTAL
  Filled 2024-05-21 (×9): qty 1

## 2024-05-21 MED ORDER — PENTAFLUOROPROP-TETRAFLUOROETH EX AERO: 1.0000 | INHALATION_SPRAY | CUTANEOUS | Status: DC | PRN

## 2024-05-21 MED ORDER — HEPARIN SODIUM (PORCINE) 1000 UNIT/ML DIALYSIS: 1000.0000 [IU] | INTRAMUSCULAR | Status: DC | PRN

## 2024-05-21 MED ORDER — MIDODRINE HCL 5 MG PO TABS
5.0000 mg | ORAL_TABLET | Freq: Once | ORAL | Status: DC
  Filled 2024-05-21: qty 1

## 2024-05-21 MED ORDER — ALTEPLASE 2 MG IJ SOLR: 2.0000 mg | Freq: Once | INTRAMUSCULAR | Status: DC | PRN

## 2024-05-21 MED ORDER — LIDOCAINE HCL (PF) 1 % IJ SOLN: 5.0000 mL | INTRAMUSCULAR | Status: DC | PRN

## 2024-05-21 MED ORDER — LIDOCAINE-PRILOCAINE 2.5-2.5 % EX CREA: 1.0000 | TOPICAL_CREAM | CUTANEOUS | Status: DC | PRN

## 2024-05-21 MED ORDER — ANTICOAGULANT SODIUM CITRATE 4% (200MG/5ML) IV SOLN: 5.0000 mL | Status: DC | PRN

## 2024-05-21 NOTE — Progress Notes (Signed)
 PROGRESS NOTE    Devin Bullock  FMW:[id number] DOB: [birth year] DOA: 05/13/2024 PCP: [facility]   Brief Narrative:  Devin Bullock is a 58 y.o. male with medical history significant for ESRD on MWF HD, prostate and bladder cancer s/p TURBT 2022, anemia of CKD, paroxysmal SVT, history of spontaneous right perinephric hematoma (June 2025) who presented to the ED for evaluation of hematuria with large blood clots for 2 days associated with suprapubic and urethral pain.  Patient was previously admitted in June for acute blood loss anemia due to spontaneous right perinephric requiring 4 unit PRBC transfusions.  He was monitored closely and did not require any other specific intervention.  Patient's primary nephrology and urology teams are with [facility] in [place].  He was previously receiving BCG treatments which were held due to ongoing urethral bleeding.  He underwent cystoscopy 04/19/2024 which showed a tumor in the bladder.  He is scheduled for TURBT on 06/21/2024.   CT abdomen/pelvis with contrast showed severe bilateral hydroureteronephrosis, unchanged from prior exam. High density material seen dependently within the bilateral renal pelves and mid right ureter, likely sediment or blood products. Chronic bladder wall thickening and perivesicular fat stranding.    Assessment & Plan:   Principal Problem:   Gross hematuria Active Problems:   Bladder cancer (HCC)   Obesity, Class II, BMI 35-39.9   Anemia of chronic disease   ESRD (end stage renal disease) on dialysis [facility])   Essential hypertension   HFrEF (heart failure with reduced ejection fraction) (HCC)   Paroxysmal SVT (supraventricular tachycardia)   Hyperlipidemia   >>Gross hematuria, ongoing  >>history of prostate/bladder cancer and spontaneous right perinephric hematoma in June 2025/gross hematuria: CTA and cystoscopy unable to locate the source of bleeding, concern of ureteral source based on clot  morphology and a clear bladder on cystoscopy. Patient is high risk due to history of bladder cancer. Unable to tolerate Foley placement and manual irrigation.  Per urology, they are unable to offer any other intervention to the patient and recommended that patient be transferred to [facility] or go home and follow-up with them as outpatient. - Patient continues to decline transfer to [facility].  He is also not ready for discharge home because he continues to have bloody urine with need for blood transfusion.  He was transfused most recently in 05/20/2024.  Hemoglobin slightly improved to 7.5.-Will repeat hemoglobin tomorrow.  Acute blood loss anemia secondary to hematuria: patient has received total of 3 units of PRBC transfusion this hospitalization with the last PRBC transfusion on 05/20/2024.    Repeat hemoglobin in the AM.  Hyperlipidemia: Continue atorvastatin .  History of SVT: Patient had 1 episode of SVT in the ED which was treated with his oral metoprolol .  Currently tolerating metoprolol  12.5 twice daily.  Heart failure with reduced ejection fraction: Appears stable.  Continue Lopressor .  Hypotension: Patient on midodrine .  ESRD on HD: Monday Wednesday Friday schedule.  Management per nephrology.  Class II obesity: BMI almost 38.  Weight loss and diet modification counseled.  DVT prophylaxis: SCDs Start: 05/13/24 2350   Code Status: Full Code  Family Communication:  None present at bedside.  Plan of care discussed with patient in length and he/she verbalized understanding and agreed with it.  Status is: Inpatient Remains inpatient appropriate because: Still having hematuria   Estimated body mass index is 36.07 kg/m as calculated from the following:   Height as of this encounter: 6' 4 (1.93 m).  Weight as of this encounter: 134.4 kg.    Nutritional Assessment: Body mass index is 36.07 kg/m.SABRA Seen by dietician.  I agree with the assessment and plan as outlined  below: Nutrition Status:       Consultants:  Nephrology  Urology-signed off  Procedures:  As above  Antimicrobials:  Anti-infectives (From admission, onward)    Start     Dose/Rate Route Frequency Ordered Stop   05/15/24 1400  cefTRIAXone (ROCEPHIN) 1 g in sodium chloride  0.9 % 100 mL IVPB  Status:  Discontinued       Note to Pharmacy: Please tube to 2W   1 g 200 mL/hr over 30 Minutes Intravenous Daily 05/15/24 1009 05/21/24 1235         Subjective: Patient seen and examined.  Patient seen and examined at bedside.  Vital signs stable.  He had hemodialysis today.  He continues to have bloody urine with no clots.  Denies abdominal pain today.  He also reports of bleeding per rectum which he suspects is from hemorrhoids.  Again discussed urological plan to follow-up with primary urologist and [facility].  He continues to decline transfer.  He says [place] would to [place] far from [place].   Objective: Vitals:   05/21/24 1154 05/21/24 1159 05/21/24 1216 05/21/24 1232  BP: 103/66 99/65  133/82  Pulse: 81 80  89  Resp: (!) 23 19  18   Temp:  97.7 F (36.5 C)  (!) 97.4 F (36.3 C)  TempSrc:    Oral  SpO2: 97% 96%  98%  Weight:   134.4 kg   Height:        Intake/Output Summary (Last 24 hours) at 05/21/2024 1514 Last data filed at 05/21/2024 1159 Gross per 24 hour  Intake 434.33 ml  Output 2000 ml  Net -1565.67 ml    Filed Weights   05/21/24 0600 05/21/24 0820 05/21/24 1216  Weight: (!) 136.3 kg (!) 136.3 kg 134.4 kg    Examination:  General exam: Appears calm and comfortable, obese Respiratory system: Clear to auscultation. Respiratory effort normal. Cardiovascular system: S1 & S2 heard, RRR. No JVD, murmurs, rubs, gallops or clicks. No pedal edema. Gastrointestinal system: Abdomen is nondistended, right flank tenderness+ Central nervous system: Alert and oriented. No focal neurological deficits. Extremities: Symmetric 5 x 5 power. Skin: No rashes, lesions or  ulcers.  Psychiatry: Judgement and insight appear normal. Mood & affect appropriate.    Data Reviewed: I have personally reviewed following labs and imaging studies  CBC: Recent Labs  Lab 05/16/24 0151 05/16/24 1142 05/16/24 1704 05/17/24 0316 05/18/24 0323 05/19/24 0401 05/20/24 1127 05/20/24 2138 05/21/24 0729  WBC 9.3  --  8.1 6.6 6.7 4.7  --   --  5.5  NEUTROABS 7.3  --   --  4.6 4.4 3.0  --   --   --   HGB 7.3*   < > 6.8* 7.5* 8.3* 7.5* 6.9* 7.5* 7.4*  HCT 23.2*   < > 21.3* 23.5* 26.0* 23.4* 21.7* 23.1* 22.5*  MCV 98.3  --  98.2 95.5 95.2 95.9  --   --  93.4  PLT 188  --  174 138* 160 156  --   --  145*   < > = values in this interval not displayed.   Basic Metabolic Panel: Recent Labs  Lab 05/15/24 0825 05/15/24 1310 05/16/24 1704 05/19/24 0401 05/21/24 0729  NA 134* 135 133* 136 132*  K 4.0 3.8 3.5 4.3 4.0  CL 90*  94* 88* 96* 93*  CO2 27 27 30 28 23   GLUCOSE 129* 116* 150* 92 94  BUN 58* 25* 46* 30* 49*  CREATININE 13.26* 7.19* 10.93* 7.25* 11.71*  CALCIUM  9.2 9.3 9.4 8.7* 8.6*  PHOS 6.1* 3.6 4.4  --  5.8*   GFR: Estimated Creatinine Clearance: 10 mL/min (A) (by C-G formula based on SCr of 11.71 mg/dL (H)). Liver Function Tests: Recent Labs  Lab 05/15/24 0825 05/15/24 1310 05/16/24 1704 05/21/24 0729  ALBUMIN  3.3* 3.5 3.1* 2.7*   No results for input(s): LIPASE, AMYLASE in the last 168 hours. No results for input(s): AMMONIA in the last 168 hours. Coagulation Profile: No results for input(s): INR, PROTIME in the last 168 hours.  Cardiac Enzymes: No results for input(s): CKTOTAL, CKMB, CKMBINDEX, TROPONINI in the last 168 hours. BNP (last 3 results) No results for input(s): PROBNP in the last 8760 hours. HbA1C: Recent Labs    05/20/24 0245  HGBA1C 4.4*   CBG: No results for input(s): GLUCAP in the last 168 hours. Lipid Profile: No results for input(s): CHOL, HDL, LDLCALC, TRIG, CHOLHDL, LDLDIRECT in the  last 72 hours. Thyroid  Function Tests: No results for input(s): TSH, T4TOTAL, FREET4, T3FREE, THYROIDAB in the last 72 hours. Anemia Panel: No results for input(s): VITAMINB12, FOLATE, FERRITIN, TIBC, IRON, RETICCTPCT in the last 72 hours. Sepsis Labs: No results for input(s): PROCALCITON, LATICACIDVEN in the last 168 hours.   No results found for this or any previous visit (from the past 240 hours).   Radiology Studies: No results found.    Scheduled Meds:  sodium chloride    Intravenous Once   atorvastatin   40 mg Oral Daily   Chlorhexidine  Gluconate Cloth  6 each Topical Q0600   cinacalcet   60 mg Oral Q breakfast   darbepoetin (ARANESP ) injection - DIALYSIS  150 mcg Subcutaneous Q Mon-1800   hydrocortisone   25 mg Rectal BID   metoprolol  tartrate  5 mg Intravenous Once   metoprolol  tartrate  12.5 mg Oral BID   midodrine   5 mg Oral Q M,W,F-HD   midodrine   5 mg Oral Once in dialysis   sevelamer  carbonate  2,400 mg Oral TID WC   Continuous Infusions:     LOS: 7 days   Derryl Duval, MD [facility]  05/21/2024, 3:14 PM  Total time spent 55 minutes *Please note that this is a verbal dictation therefore any spelling or grammatical errors are due to the Dragon Medical One system interpretation.  Please page via Amion and do not message via secure chat for urgent patient care matters. Secure chat can be used for non urgent patient care matters.  How to contact the TRH Attending or Consulting provider 7A - 7P or covering provider during after hours 7P -7A, for this patient?  Check the care team in [facility] and look for a) attending/consulting TRH provider listed and b) the TRH team listed. Page or secure chat 7A-7P. Log into [email or web address] and use [place]'s universal password to access. If you do not have the password, please contact the hospital operator. Locate the TRH provider you are looking for under [facility] and page to a number  that you can be directly reached. If you still have difficulty reaching the provider, please page the [facility] (Director on Call) for the Hospitalists listed on amion for assistance.

## 2024-05-21 NOTE — Progress Notes (Signed)
 [facility] Progress Note   Subjective:   Patient seen and examined at bedside. Feeling better today. Had transfusion yesterday. Workup for source ongoing.  Objective Vitals:   05/21/24 0900 05/21/24 0930 05/21/24 1000 05/21/24 1030  BP: 116/69 103/89 114/66 93/60  Pulse: 79 87 80 75  Resp: 17 17 (!) 21 18  Temp:      TempSrc:      SpO2: 97% 95% 96% 98%  Weight:      Height:       Physical Exam General:well appearing male in NAD Heart:RRR Lungs:nml WOB on RA Abdomen:soft, NTND Extremities:no LE edema Dialysis Access: LU AVF +b/t   Filed Weights   05/20/24 0336 05/21/24 0600 05/21/24 0820  Weight: 135 kg (!) 136.3 kg (!) 136.3 kg    Intake/Output Summary (Last 24 hours) at 05/21/2024 1104 Last data filed at 05/20/2024 1845 Gross per 24 hour  Intake 434.33 ml  Output --  Net 434.33 ml    Additional Objective Labs: Basic Metabolic Panel: Recent Labs  Lab 05/15/24 1310 05/16/24 1704 05/19/24 0401 05/21/24 0729  NA 135 133* 136 132*  K 3.8 3.5 4.3 4.0  CL 94* 88* 96* 93*  CO2 27 30 28 23   GLUCOSE 116* 150* 92 94  BUN 25* 46* 30* 49*  CREATININE 7.19* 10.93* 7.25* 11.71*  CALCIUM  9.3 9.4 8.7* 8.6*  PHOS 3.6 4.4  --  5.8*   Liver Function Tests: Recent Labs  Lab 05/15/24 1310 05/16/24 1704 05/21/24 0729  ALBUMIN  3.5 3.1* 2.7*   No results for input(s): LIPASE, AMYLASE in the last 168 hours. CBC: Recent Labs  Lab 05/16/24 1704 05/17/24 0316 05/18/24 0323 05/19/24 0401 05/20/24 1127 05/20/24 2138 05/21/24 0729  WBC 8.1 6.6 6.7 4.7  --   --  5.5  NEUTROABS  --  4.6 4.4 3.0  --   --   --   HGB 6.8* 7.5* 8.3* 7.5* 6.9* 7.5* 7.4*  HCT 21.3* 23.5* 26.0* 23.4* 21.7* 23.1* 22.5*  MCV 98.2 95.5 95.2 95.9  --   --  93.4  PLT 174 138* 160 156  --   --  145*    Medications:  anticoagulant sodium citrate      cefTRIAXone (ROCEPHIN)  IV 1 g (05/20/24 1844)    sodium chloride    Intravenous Once   atorvastatin   40 mg Oral Daily    Chlorhexidine  Gluconate Cloth  6 each Topical Q0600   cinacalcet   60 mg Oral Q breakfast   darbepoetin (ARANESP ) injection - DIALYSIS  150 mcg Subcutaneous Q Mon-1800   metoprolol  tartrate  5 mg Intravenous Once   metoprolol  tartrate  12.5 mg Oral BID   midodrine   5 mg Oral Q M,W,F-HD   midodrine   5 mg Oral Once in dialysis   sevelamer  carbonate  2,400 mg Oral TID WC    Dialysis Orders: MWF NW 4.5h   B450   136kg   2K bath   AVF  Heparin  none - Mircera 150 q 2 weeks (last 9/22, due 10/06) - Calcitriol  2.16mcg PO q HD - Outpatient Hgb trends: 8.8 (9/9), 7.3 (9/17), 7.4 (9/24)     Assessment/Plan:  Gross hematuria with clots: complicated situation, etiology unclear. Hx of bladder and prostate Cancer.  Followed by Urology at [facility].  [facility], unable to offer intervention and recommended he transfer to [facility] or d/c home and f/u with them as outpatient. IR consulted for possible renal embolization but did not recommend embolization at this time as they  do not see a specific target area to embolize. Additional work up management per PMD.  Per notes, looks like perhaps re-engagement with urology ESRD: HD MWF. Next HD 05/21/24.  Hx SVT: had high HR event , rec'd IV adenosine  and rapid response.  Hypertension - BP low normal.  on metoprolol  12.5 bid and midodrine  pre HD three times per week with HD.   Acute on chronic anemia of CKD: 2/2 #1. 2 units pRBC given so far. Transfusing prn. Hgb 6.9 05/20/24, 1 unit pRBC ordered. Next esa due 10/6. Volume: Under EDW if weights correct.  Does not appear volume overloaded.  UF as tolerated.   Metabolic bone disease: Calcium  and phos in goal, continue home meds. Nutrition - renal diet w/fluid restrictions.  H/o R perinephric hematoma: June 2025, w/ hydronephrosis and clot in the renal pelvis. Rec'd 3u prbc's and 1g TXA Dispo: Pending  Almarie Bonine MD [facility] 05/21/2024,11:04 AM  LOS: 7 days

## 2024-05-21 NOTE — Progress Notes (Signed)
 Pt has not had bowel movement since PTA On call [facility] paged  Pt has conflicting reports, one time explains that he had taken an oral liquid laxative, but then explains he was given a prescription for dulcolax suppositories for 10 days and states that he has not produced a BM b/c he has not been taking them  On call new order for dulcolax suppository x1   Has PRN senna, taken  Pt has home medications in his bag at the bedside but declines to let this RN bring to the pharmacy - charge RN notified - 2nd RN witnessed medications - none are narcotics  Pt had BM's overnight  On call [facility] was contacted as well re: pt declined metoprolol , concerned about BP, OK w/ MD, after discussion w/ pt about maybe taking it he stated that he will wait to see what his BP is in the AM  No bloody output overnight, pt had ~136ml out during day to urinal in the bathroom

## 2024-05-21 NOTE — Procedures (Signed)
 Patient seen and examined on Hemodialysis. The procedure was supervised and I have made appropriate changes. BP 96/82   Pulse 80   Temp 97.8 F (36.6 C)   Resp (!) 21   Ht 6' 4 (1.93 m)   Wt (!) 136.3 kg   SpO2 100%   BMI 36.58 kg/m    QB 400 mL/ min, UF goal 2L Received midorine pre-Hd, BP dropped into 80s mid-way through rx so got another 5 mg midodrine   Tolerating treatment without complaints at this time.   Almarie Bonine MD [facility] Pgr [phone] 11:10 AM

## 2024-05-21 NOTE — Plan of Care (Signed)
  Problem: Education: Goal: Knowledge of General Education information will improve Description: Including pain rating scale, medication(s)/side effects and non-pharmacologic comfort measures Outcome: Progressing   Problem: Health Behavior/Discharge Planning: Goal: Ability to manage health-related needs will improve Outcome: Progressing   Problem: Clinical Measurements: Goal: Will remain free from infection Outcome: Progressing Goal: Cardiovascular complication will be avoided Outcome: Progressing   Problem: Coping: Goal: Level of anxiety will decrease Outcome: Progressing   Problem: Elimination: Goal: Will not experience complications related to bowel motility Outcome: Progressing

## 2024-05-22 ENCOUNTER — Encounter (HOSPITAL_COMMUNITY): Payer: Self-pay | Admitting: Internal Medicine

## 2024-05-22 DIAGNOSIS — R310 Gross hematuria: Secondary | ICD-10-CM | POA: Diagnosis not present

## 2024-05-22 LAB — HEMOGLOBIN AND HEMATOCRIT, BLOOD
HCT: 23.5 % — ABNORMAL LOW (ref 39.0–52.0)
Hemoglobin: 7.8 g/dL — ABNORMAL LOW (ref 13.0–17.0)

## 2024-05-22 NOTE — Plan of Care (Signed)
 ?  Problem: Education: ?Goal: Knowledge of General Education information will improve ?Description: Including pain rating scale, medication(s)/side effects and non-pharmacologic comfort measures ?Outcome: Progressing ?  ?Problem: Health Behavior/Discharge Planning: ?Goal: Ability to manage health-related needs will improve ?Outcome: Progressing ?  ?Problem: Coping: ?Goal: Level of anxiety will decrease ?Outcome: Progressing ?  ?

## 2024-05-22 NOTE — Plan of Care (Signed)
  Problem: Clinical Measurements: Goal: Will remain free from infection Outcome: Progressing   Problem: Activity: Goal: Risk for activity intolerance will decrease Outcome: Progressing   Problem: Safety: Goal: Ability to remain free from injury will improve Outcome: Progressing   

## 2024-05-22 NOTE — Progress Notes (Addendum)
 Patient has some medications in his bag besides the bed, educated him regarding the hospital policy to store home medication in the main pharmacy and will provide him during discharge, patient keep refusing to store in pharmacy, notified to the attending MD and charge RN.  This RN didn't witness any narcotic medications.

## 2024-05-22 NOTE — Progress Notes (Signed)
 [facility] Progress Note   Subjective:   Patient seen and examined at bedside. Feeling better today. Had transfusion yesterday. Workup for source ongoing.  Objective Vitals:   05/22/24 0738 05/22/24 0754 05/22/24 0930 05/22/24 1205  BP: 124/69  (!) 89/58 107/68  Pulse: 73  87 92  Resp: 20 (!) 24 18 19   Temp: 98.5 F (36.9 C) 98.3 F (36.8 C) 98.1 F (36.7 C) 98.5 F (36.9 C)  TempSrc: Oral Oral Oral Oral  SpO2: 100%  93% 100%  Weight:      Height:       Physical Exam General:well appearing male in NAD Heart:RRR Lungs:nml WOB on RA Abdomen:soft, NTND Extremities:no LE edema Dialysis Access: LU AVF +b/t   Filed Weights   05/21/24 0820 05/21/24 1216 05/22/24 0622  Weight: (!) 136.3 kg 134.4 kg 134.4 kg   No intake or output data in the 24 hours ending 05/22/24 1328   Additional Objective Labs: Basic Metabolic Panel: Recent Labs  Lab 05/16/24 1704 05/19/24 0401 05/21/24 0729  NA 133* 136 132*  K 3.5 4.3 4.0  CL 88* 96* 93*  CO2 30 28 23   GLUCOSE 150* 92 94  BUN 46* 30* 49*  CREATININE 10.93* 7.25* 11.71*  CALCIUM  9.4 8.7* 8.6*  PHOS 4.4  --  5.8*   Liver Function Tests: Recent Labs  Lab 05/16/24 1704 05/21/24 0729  ALBUMIN  3.1* 2.7*   No results for input(s): LIPASE, AMYLASE in the last 168 hours. CBC: Recent Labs  Lab 05/16/24 1704 05/17/24 0316 05/18/24 0323 05/19/24 0401 05/20/24 1127 05/20/24 2138 05/21/24 0729 05/22/24 1031  WBC 8.1 6.6 6.7 4.7  --   --  5.5  --   NEUTROABS  --  4.6 4.4 3.0  --   --   --   --   HGB 6.8* 7.5* 8.3* 7.5*   < > 7.5* 7.4* 7.8*  HCT 21.3* 23.5* 26.0* 23.4*   < > 23.1* 22.5* 23.5*  MCV 98.2 95.5 95.2 95.9  --   --  93.4  --   PLT 174 138* 160 156  --   --  145*  --    < > = values in this interval not displayed.    Medications:    sodium chloride    Intravenous Once   atorvastatin   40 mg Oral Daily   Chlorhexidine  Gluconate Cloth  6 each Topical Q0600   cinacalcet   60 mg Oral Q  breakfast   darbepoetin (ARANESP ) injection - DIALYSIS  150 mcg Subcutaneous Q Mon-1800   hydrocortisone   25 mg Rectal BID   metoprolol  tartrate  5 mg Intravenous Once   metoprolol  tartrate  12.5 mg Oral BID   midodrine   5 mg Oral Q M,W,F-HD   midodrine   5 mg Oral Once in dialysis   sevelamer  carbonate  2,400 mg Oral TID WC    Dialysis Orders: MWF NW 4.5h   B450   136kg   2K bath   AVF  Heparin  none - Mircera 150 q 2 weeks (last 9/22, due 10/06) - Calcitriol  2.54mcg PO q HD - Outpatient Hgb trends: 8.8 (9/9), 7.3 (9/17), 7.4 (9/24)     Assessment/Plan:  Gross hematuria with clots: complicated situation, etiology unclear. Hx of bladder and prostate Cancer.  Followed by Urology at [facility].  [facility], unable to offer intervention and recommended he transfer to [facility] or d/c home and f/u with them as outpatient. IR consulted for possible renal embolization but did not recommend embolization at  this time as they do not see a specific target area to embolize. Additional work up management per PMD.  Per notes, looks like perhaps re-engagement with urology ESRD: HD MWF. Next HD 05/23/24.  Run time is 4 hrs 30 min will order  Hx SVT: had high HR event , rec'd IV adenosine  and rapid response.  Hypertension - BP low normal.  on metoprolol  12.5 bid and midodrine  pre HD three times per week with HD.   Acute on chronic anemia of CKD: 2/2 #1. 2 units pRBC given so far. Transfusing prn. Hgb 6.9 05/20/24, 1 unit pRBC ordered. Next esa due 10/6. Volume: Under EDW if weights correct.  Does not appear volume overloaded.  UF as tolerated.   Metabolic bone disease: Calcium  and phos in goal, continue home meds. Nutrition - renal diet w/fluid restrictions.  H/o R perinephric hematoma: June 2025, w/ hydronephrosis and clot in the renal pelvis. Rec'd 3u prbc's and 1g TXA Dispo: Pending  Almarie Bonine MD [facility] 05/22/2024,1:28 PM  LOS: 8 days

## 2024-05-22 NOTE — Progress Notes (Addendum)
 PROGRESS NOTE    Devin Bullock  FMW:[id number] DOB: 30-Oct-1965 DOA: 05/13/2024 PCP: [facility]   Brief Narrative:  Devin Bullock is a 58 y.o. male with medical history significant for ESRD on MWF HD, prostate and bladder cancer s/p TURBT 2022, anemia of CKD, paroxysmal SVT, history of spontaneous right perinephric hematoma (June 2025) who presented to the ED for evaluation of hematuria with large blood clots for 2 days associated with suprapubic and urethral pain.  Patient was previously admitted in June for acute blood loss anemia due to spontaneous right perinephric requiring 4 unit PRBC transfusions.  He was monitored closely and did not require any other specific intervention.  Patient's primary nephrology and urology teams are with [facility] in [place].  He was previously receiving BCG treatments which were held due to ongoing urethral bleeding.  He underwent cystoscopy 04/19/2024 which showed a tumor in the bladder.  He is scheduled for TURBT on 06/21/2024. CT abdomen/pelvis with contrast showed severe bilateral hydroureteronephrosis, unchanged from prior exam. High density material seen dependently within the bilateral renal pelves and mid right ureter, likely sediment or blood products. Chronic bladder wall thickening and perivesicular fat stranding.  Patient was evaluated by urology, underwent cystoscopy with no definite bleeding source identified in bladder.  Patient has had multiple CT scans and no further intervention planned by urology at this time.  They have recommended outpatient follow-up with his primary urologist at [facility].  He was offered inpatient to inpatient transfer to [facility] which he declined.  Patient has received total of 4 units PRBC transfusion with last transfusion on 10/5.  Hematuria has slowed down considerably.  He remains hemodynamically stable.  While he can have recurrence of hematuria he is considered stable for transfer to home however he  has refused discharge.   Assessment & Plan:   Principal Problem:   Gross hematuria Active Problems:   Bladder cancer (HCC)   Obesity, Class II, BMI 35-39.9   Anemia of chronic disease   ESRD (end stage renal disease) on dialysis [name])   Essential hypertension   HFrEF (heart failure with reduced ejection fraction) (HCC)   Paroxysmal SVT (supraventricular tachycardia)   Hyperlipidemia   >>Gross hematuria, ongoing  >>history of prostate/bladder cancer and spontaneous right perinephric hematoma in June 2025/gross hematuria: CTA and cystoscopy unable to locate the source of bleeding, concern of ureteral source based on clot morphology and a clear bladder on cystoscopy. Patient is high risk due to history of bladder cancer. Unable to tolerate Foley placement and manual irrigation.  Per urology, they are unable to offer any other intervention to the patient and recommended that patient be transferred to [facility] or go home and follow-up with them as outpatient. - Patient continues to decline transfer to [facility].  - He did require multiple transfusions with PRBCs, total 4 unit with the last being on 05/20/2024.  Gross hematuria has significantly improved and hemoglobin is stabilized at 7.8.  - At this point he is felt stable to discharge to home with plan to keep his appointment with his regular urologist at [facility] or bring it closer.  He has however refused discharge today.  He persistently states that we have not diagnosed his condition and have not fixed his problem  Rectal bleeding: some blood in stool and wipes. Pt reports hemorrhoidal bleed in the past. Will continue anusol . Check Hemoglobin in the am  Hyperlipidemia: Continue atorvastatin .  History of SVT: Patient had 1 episode of  SVT in the ED which was treated with his oral metoprolol .  Currently tolerating metoprolol  12.5 twice daily.  Heart failure with reduced ejection fraction: Appears stable.  Continue  Lopressor .  Hypotension: Patient on midodrine .  ESRD on HD: Monday Wednesday Friday schedule.  Management per nephrology.  Class II obesity: BMI almost 38.  Weight loss and diet modification counseled.  DVT prophylaxis: SCDs Start: 05/13/24 2350   Code Status: Full Code  Family Communication:  None present at bedside.  Plan of care discussed with patient in length. Status is: Inpatient   Estimated body mass index is 36.07 kg/m as calculated from the following:   Height as of this encounter: 6' 4 (1.93 m).   Weight as of this encounter: 134.4 kg.    Nutritional Assessment: Body mass index is 36.07 kg/m.Devin Bullock Seen by dietician.  I agree with the assessment and plan as outlined below: Nutrition Status:       Consultants:  Nephrology  Urology-signed off  Procedures:  As above  Antimicrobials:  Anti-infectives (From admission, onward)    Start     Dose/Rate Route Frequency Ordered Stop   05/15/24 1400  cefTRIAXone (ROCEPHIN) 1 g in sodium chloride  0.9 % 100 mL IVPB  Status:  Discontinued       Note to Pharmacy: Please tube to 2W   1 g 200 mL/hr over 30 Minutes Intravenous Daily 05/15/24 1009 05/21/24 1235         Subjective: Patient seen and examined.  Patient seen and examined at bedside.  Vital signs stable.  He has very minimal amount of blood in urinal today.  He denies any abdominal pain, nausea or vomiting.  He does report of some rectal bleeding, (blood in the wipes) with no hemorrhoids.  Discussed with patient about plan of care.  I discussed that he is stable to discharge today with plan to follow-up with his urologist as an outpatient however he has refused to discharge today  Objective: Vitals:   05/22/24 0738 05/22/24 0754 05/22/24 0930 05/22/24 1205  BP: 124/69  (!) 89/58 107/68  Pulse: 73  87 92  Resp: 20 (!) 24 18 19   Temp: 98.5 F (36.9 C) 98.3 F (36.8 C) 98.1 F (36.7 C) 98.5 F (36.9 C)  TempSrc: Oral Oral Oral Oral  SpO2: 100%  93% 100%   Weight:      Height:       No intake or output data in the 24 hours ending 05/22/24 1421   Filed Weights   05/21/24 0820 05/21/24 1216 05/22/24 0622  Weight: (!) 136.3 kg 134.4 kg 134.4 kg    Examination:  General exam: Appears calm and comfortable, obese Respiratory system: Clear to auscultation. Respiratory effort normal. Cardiovascular system: S1 & S2 heard, RRR. No JVD, murmurs, rubs, gallops or clicks. No pedal edema. Gastrointestinal system: Abdomen is nondistended, right flank tenderness+ Central nervous system: Alert and oriented. No focal neurological deficits. Extremities: Symmetric 5 x 5 power. Skin: No rashes, lesions or ulcers.  Psychiatry: Judgement and insight appear normal. Mood & affect appropriate.    Data Reviewed: I have personally reviewed following labs and imaging studies  CBC: Recent Labs  Lab 05/16/24 0151 05/16/24 1142 05/16/24 1704 05/17/24 0316 05/18/24 0323 05/19/24 0401 05/20/24 1127 05/20/24 2138 05/21/24 0729 05/22/24 1031  WBC 9.3  --  8.1 6.6 6.7 4.7  --   --  5.5  --   NEUTROABS 7.3  --   --  4.6 4.4 3.0  --   --   --   --  HGB 7.3*   < > 6.8* 7.5* 8.3* 7.5* 6.9* 7.5* 7.4* 7.8*  HCT 23.2*   < > 21.3* 23.5* 26.0* 23.4* 21.7* 23.1* 22.5* 23.5*  MCV 98.3  --  98.2 95.5 95.2 95.9  --   --  93.4  --   PLT 188  --  174 138* 160 156  --   --  145*  --    < > = values in this interval not displayed.   Basic Metabolic Panel: Recent Labs  Lab 05/16/24 1704 05/19/24 0401 05/21/24 0729  NA 133* 136 132*  K 3.5 4.3 4.0  CL 88* 96* 93*  CO2 30 28 23   GLUCOSE 150* 92 94  BUN 46* 30* 49*  CREATININE 10.93* 7.25* 11.71*  CALCIUM  9.4 8.7* 8.6*  PHOS 4.4  --  5.8*   GFR: Estimated Creatinine Clearance: 10 mL/min (A) (by C-G formula based on SCr of 11.71 mg/dL (H)). Liver Function Tests: Recent Labs  Lab 05/16/24 1704 05/21/24 0729  ALBUMIN  3.1* 2.7*   No results for input(s): LIPASE, AMYLASE in the last 168 hours. No  results for input(s): AMMONIA in the last 168 hours. Coagulation Profile: No results for input(s): INR, PROTIME in the last 168 hours.  Cardiac Enzymes: No results for input(s): CKTOTAL, CKMB, CKMBINDEX, TROPONINI in the last 168 hours. BNP (last 3 results) No results for input(s): PROBNP in the last 8760 hours. HbA1C: Recent Labs    05/20/24 0245  HGBA1C 4.4*   CBG: No results for input(s): GLUCAP in the last 168 hours. Lipid Profile: No results for input(s): CHOL, HDL, LDLCALC, TRIG, CHOLHDL, LDLDIRECT in the last 72 hours. Thyroid  Function Tests: No results for input(s): TSH, T4TOTAL, FREET4, T3FREE, THYROIDAB in the last 72 hours. Anemia Panel: No results for input(s): VITAMINB12, FOLATE, FERRITIN, TIBC, IRON, RETICCTPCT in the last 72 hours. Sepsis Labs: No results for input(s): PROCALCITON, LATICACIDVEN in the last 168 hours.   No results found for this or any previous visit (from the past 240 hours).   Radiology Studies: No results found.    Scheduled Meds:  sodium chloride    Intravenous Once   atorvastatin   40 mg Oral Daily   Chlorhexidine  Gluconate Cloth  6 each Topical Q0600   cinacalcet   60 mg Oral Q breakfast   darbepoetin (ARANESP ) injection - DIALYSIS  150 mcg Subcutaneous Q Mon-1800   hydrocortisone   25 mg Rectal BID   metoprolol  tartrate  5 mg Intravenous Once   metoprolol  tartrate  12.5 mg Oral BID   midodrine   5 mg Oral Q M,W,F-HD   midodrine   5 mg Oral Once in dialysis   sevelamer  carbonate  2,400 mg Oral TID WC   Continuous Infusions:     LOS: 8 days   Derryl Duval, MD [facility]  05/22/2024, 2:21 PM  Total time spent 55 minutes *Please note that this is a verbal dictation therefore any spelling or grammatical errors are due to the Dragon Medical One system interpretation.  Please page via Amion and do not message via secure chat for urgent patient care matters. Secure  chat can be used for non urgent patient care matters.  How to contact the [facility] Attending or Consulting provider 7A - 7P or covering provider during after hours 7P -7A, for this patient?  Check the care team in [facility] and look for a) attending/consulting [facility] provider listed and b) the [facility] team listed. Page or secure chat 7A-7P. Log into [email or web address] and use [facility] universal password  to access. If you do not have the password, please contact the hospital operator. Locate the [facility] provider you are looking for under [facility] and page to a number that you can be directly reached. If you still have difficulty reaching the provider, please page the [facility] (Director on Call) for the Hospitalists listed on amion for assistance.

## 2024-05-23 DIAGNOSIS — R310 Gross hematuria: Secondary | ICD-10-CM | POA: Diagnosis not present

## 2024-05-23 LAB — HEMOGLOBIN AND HEMATOCRIT, BLOOD
HCT: 23.8 % — ABNORMAL LOW (ref 39.0–52.0)
Hemoglobin: 7.8 g/dL — ABNORMAL LOW (ref 13.0–17.0)

## 2024-05-23 MED ORDER — METOPROLOL TARTRATE 5 MG/5ML IV SOLN: 5.0000 mg | INTRAVENOUS | Status: DC | PRN

## 2024-05-23 MED ORDER — IPRATROPIUM-ALBUTEROL 0.5-2.5 (3) MG/3ML IN SOLN: 3.0000 mL | RESPIRATORY_TRACT | Status: DC | PRN

## 2024-05-23 MED ORDER — GLUCAGON HCL RDNA (DIAGNOSTIC) 1 MG IJ SOLR: 1.0000 mg | INTRAMUSCULAR | Status: DC | PRN

## 2024-05-23 MED ORDER — HYDRALAZINE HCL 20 MG/ML IJ SOLN: 10.0000 mg | INTRAMUSCULAR | Status: DC | PRN

## 2024-05-23 NOTE — Progress Notes (Signed)
 [facility] Progress Note   Subjective:   Patient seen and examined at bedside. Working on a Programmer, applications.  Objective Vitals:   05/23/24 1240 05/23/24 1300 05/23/24 1330 05/23/24 1400  BP: 110/78 120/70 118/73 106/69  Pulse: 92 91 84 87  Resp: 20 (!) 27 (!) 23 (!) 22  Temp:      TempSrc:      SpO2: 97% 98% 97% 96%  Weight:      Height:       Physical Exam General:well appearing male in NAD Heart:RRR Lungs:nml WOB on RA Abdomen:soft, NTND Extremities:no LE edema Dialysis Access: LU AVF +b/t   Filed Weights   05/22/24 0622 05/23/24 0445 05/23/24 1235  Weight: 134.4 kg 135.4 kg (!) 137.3 kg   No intake or output data in the 24 hours ending 05/23/24 1417   Additional Objective Labs: Basic Metabolic Panel: Recent Labs  Lab 05/16/24 1704 05/19/24 0401 05/21/24 0729  NA 133* 136 132*  K 3.5 4.3 4.0  CL 88* 96* 93*  CO2 30 28 23   GLUCOSE 150* 92 94  BUN 46* 30* 49*  CREATININE 10.93* 7.25* 11.71*  CALCIUM  9.4 8.7* 8.6*  PHOS 4.4  --  5.8*   Liver Function Tests: Recent Labs  Lab 05/16/24 1704 05/21/24 0729  ALBUMIN  3.1* 2.7*   No results for input(s): LIPASE, AMYLASE in the last 168 hours. CBC: Recent Labs  Lab 05/16/24 1704 05/17/24 0316 05/18/24 0323 05/19/24 0401 05/20/24 1127 05/21/24 0729 05/22/24 1031 05/23/24 0325  WBC 8.1 6.6 6.7 4.7  --  5.5  --   --   NEUTROABS  --  4.6 4.4 3.0  --   --   --   --   HGB 6.8* 7.5* 8.3* 7.5*   < > 7.4* 7.8* 7.8*  HCT 21.3* 23.5* 26.0* 23.4*   < > 22.5* 23.5* 23.8*  MCV 98.2 95.5 95.2 95.9  --  93.4  --   --   PLT 174 138* 160 156  --  145*  --   --    < > = values in this interval not displayed.    Medications:    sodium chloride    Intravenous Once   atorvastatin   40 mg Oral Daily   Chlorhexidine  Gluconate Cloth  6 each Topical Q0600   cinacalcet   60 mg Oral Q breakfast   darbepoetin (ARANESP ) injection - DIALYSIS  150 mcg Subcutaneous Q Mon-1800   hydrocortisone   25 mg Rectal BID    metoprolol  tartrate  5 mg Intravenous Once   metoprolol  tartrate  12.5 mg Oral BID   midodrine   5 mg Oral Q M,W,F-HD   midodrine   5 mg Oral Once in dialysis   sevelamer  carbonate  2,400 mg Oral TID WC    Dialysis Orders: MWF NW 4.5h   B450   136kg   2K bath   AVF  Heparin  none - Mircera 150 q 2 weeks (last 9/22, due 10/06) - Calcitriol  2.66mcg PO q HD - Outpatient Hgb trends: 8.8 (9/9), 7.3 (9/17), 7.4 (9/24)     Assessment/Plan:  Gross hematuria with clots: complicated situation, etiology unclear. Hx of bladder and prostate Cancer.  Followed by Urology at [facility].  [facility], unable to offer intervention and recommended he transfer to [facility] or d/c home and f/u with them as outpatient. IR consulted for possible renal embolization but did not recommend embolization at this time as they do not see a specific target area to embolize. Additional work up  management per PMD.  Per notes, looks like perhaps re-engagement with urology ESRD: HD MWF. Next HD 05/23/24.  Run time is 4 hrs 30 min.  Hx SVT: had high HR event , rec'd IV adenosine  and rapid response.  Hypertension - BP low normal.  on metoprolol  12.5 bid and midodrine  pre HD three times per week with HD.   Acute on chronic anemia of CKD: 2/2 #1. 2 units pRBC given so far. Transfusing prn. Hgb 6.9 05/20/24, 1 unit pRBC ordered. Next esa due 10/6. Volume: Under EDW if weights correct.  Does not appear volume overloaded.  UF as tolerated.   Metabolic bone disease: Calcium  and phos in goal, continue home meds. Nutrition - renal diet w/fluid restrictions.  H/o R perinephric hematoma: June 2025, w/ hydronephrosis and clot in the renal pelvis. Rec'd 3u prbc's and 1g TXA Dispo: Pending  Almarie Bonine MD [facility] 05/23/2024,2:17 PM  LOS: 9 days

## 2024-05-23 NOTE — Progress Notes (Signed)
 PROGRESS NOTE    Devin Bullock  FMW:[id number] DOB: 1966/05/29 DOA: 05/13/2024 PCP: [facility]    Brief Narrative:    58 y.o. male with medical history significant for ESRD on MWF HD, prostate and bladder cancer s/p TURBT 2022, anemia of CKD, paroxysmal SVT, history of spontaneous right perinephric hematoma (June 2025) who presented to the ED for evaluation of hematuria with large blood clots for 2 days associated with suprapubic and urethral pain.  Previously admitted in June for acute blood loss anemia from spontaneous right perinephric bleed requiring 4 units PRBC transfusion follows with urology and nephrology at [facility] in [place].  Underwent cystoscopy 9/4 which showed bladder tumor and is scheduled for TURBT on 06/21/2024. CT abdomen pelvis with contrast shows bilateral severe hydroureteronephrosis unchanged from prior exam, chronic bladder wall thickening.  Seen by urology underwent cystoscopy with no definitive source.  During this hospitalization has required PRBC transfusion.  Declining transfer to [facility] for discharge home.  Assessment & Plan:  Gross hematuria, ongoing  Acute blood loss anemia history of prostate/bladder cancer and spontaneous right perinephric hematoma in June 2025/gross hematuria: CTA and cystoscopy unable to locate the source of bleeding, concern of ureteral source based on clot morphology and a clear bladder on cystoscopy. Patient is high risk due to history of bladder cancer. Unable to tolerate Foley placement and manual irrigation.  Per urology, they are unable to offer any other intervention to the patient and recommended that patient be transferred to [facility] or go home and follow-up with them as outpatient.  Declining transfer to [facility]. -Status post 3 unit PRBC transfusion. Last tx 10/5 -Hemoglobin around 7.8.    Rectal bleeding  some blood in stool and wipes. Pt reports hemorrhoidal bleed in  the past. Will continue anusol . Check Hemoglobin in the am   Hyperlipidemia  Continue atorvastatin .   History of SVT  Patient had 1 episode of SVT in the ED which was treated with his oral metoprolol .  Currently tolerating metoprolol  12.5 twice daily.   Heart failure with preserved ejection fraction 55%  Appears stable.  Continue Lopressor .   Hypotension  Patient on midodrine .   ESRD on HD  Monday Wednesday Friday schedule.  Management per nephrology.   Class II obesity  BMI almost 38.  Weight loss and diet modification counseled.   DVT prophylaxis: SCDs Start: 05/13/24 2350      Code Status: Full Code Family Communication:   Status is: Inpatient Remains inpatient appropriate because: Working on safe disposition as patient does not want to be discharged   PT Follow up Recs:   Subjective: Patient is very argumentative about his discharge plans.  Tells me clearly he does not want to go to [facility] for his further urologic care.  He also declines going home as he is afraid of having further bleeding requiring transfusion. I explained to him at this time he is not having any active bleeding and is established care at [facility] therefore he needs to follow-up with their service. He is also declining initiating transfer to [facility]  Examination:  General exam: Appears calm and comfortable  Respiratory system: Clear to auscultation. Respiratory effort normal. Cardiovascular system: S1 & S2 heard, RRR. No JVD, murmurs, rubs, gallops or clicks. No pedal edema. Gastrointestinal system: Abdomen is nondistended, soft and nontender. No organomegaly or masses felt. Normal bowel sounds heard. Central nervous system: Alert and oriented. No focal neurological deficits. Extremities: Symmetric 5 x 5 power. Skin:  No rashes, lesions or ulcers Psychiatry: Judgement and insight appear normal. Mood & affect appropriate.                Diet Orders (From admission, onward)      Start     Ordered   05/18/24 1259  Diet renal with fluid restriction Fluid restriction: 1200 mL Fluid; Room service appropriate? Yes; Fluid consistency: Thin  Diet effective now       Question Answer Comment  Fluid restriction: 1200 mL Fluid   Room service appropriate? Yes   Fluid consistency: Thin      05/18/24 1259            Objective: Vitals:   05/23/24 1142 05/23/24 1235 05/23/24 1240 05/23/24 1300  BP: (!) 101/57 121/78 110/78 120/70  Pulse: 92 92 92 91  Resp: 18 (!) 24 20 (!) 27  Temp: 98.1 F (36.7 C) 98.3 F (36.8 C)    TempSrc: Oral     SpO2: 92% 98% 97% 98%  Weight:  (!) 137.3 kg    Height:        Intake/Output Summary (Last 24 hours) at 05/23/2024 1317 Last data filed at 05/22/2024 1330 Gross per 24 hour  Intake 240 ml  Output --  Net 240 ml   Filed Weights   05/22/24 0622 05/23/24 0445 05/23/24 1235  Weight: 134.4 kg 135.4 kg (!) 137.3 kg    Scheduled Meds:  sodium chloride    Intravenous Once   atorvastatin   40 mg Oral Daily   Chlorhexidine  Gluconate Cloth  6 each Topical Q0600   cinacalcet   60 mg Oral Q breakfast   darbepoetin (ARANESP ) injection - DIALYSIS  150 mcg Subcutaneous Q Mon-1800   hydrocortisone   25 mg Rectal BID   metoprolol  tartrate  5 mg Intravenous Once   metoprolol  tartrate  12.5 mg Oral BID   midodrine   5 mg Oral Q M,W,F-HD   midodrine   5 mg Oral Once in dialysis   sevelamer  carbonate  2,400 mg Oral TID WC   Continuous Infusions:  Nutritional status     Body mass index is 36.84 kg/m.  Data Reviewed:   CBC: Recent Labs  Lab 05/16/24 1704 05/17/24 0316 05/18/24 0323 05/19/24 0401 05/20/24 1127 05/20/24 2138 05/21/24 0729 05/22/24 1031 05/23/24 0325  WBC 8.1 6.6 6.7 4.7  --   --  5.5  --   --   NEUTROABS  --  4.6 4.4 3.0  --   --   --   --   --   HGB 6.8* 7.5* 8.3* 7.5* 6.9* 7.5* 7.4* 7.8* 7.8*  HCT 21.3* 23.5* 26.0* 23.4* 21.7* 23.1* 22.5* 23.5* 23.8*  MCV 98.2 95.5 95.2 95.9  --   --  93.4  --   --   PLT 174  138* 160 156  --   --  145*  --   --    Basic Metabolic Panel: Recent Labs  Lab 05/16/24 1704 05/19/24 0401 05/21/24 0729  NA 133* 136 132*  K 3.5 4.3 4.0  CL 88* 96* 93*  CO2 30 28 23   GLUCOSE 150* 92 94  BUN 46* 30* 49*  CREATININE 10.93* 7.25* 11.71*  CALCIUM  9.4 8.7* 8.6*  PHOS 4.4  --  5.8*   GFR: Estimated Creatinine Clearance: 10.2 mL/min (A) (by C-G formula based on SCr of 11.71 mg/dL (H)). Liver Function Tests: Recent Labs  Lab 05/16/24 1704 05/21/24 0729  ALBUMIN  3.1* 2.7*   No results for input(s): LIPASE, AMYLASE in  the last 168 hours. No results for input(s): AMMONIA in the last 168 hours. Coagulation Profile: No results for input(s): INR, PROTIME in the last 168 hours. Cardiac Enzymes: No results for input(s): CKTOTAL, CKMB, CKMBINDEX, TROPONINI in the last 168 hours. BNP (last 3 results) No results for input(s): PROBNP in the last 8760 hours. HbA1C: No results for input(s): HGBA1C in the last 72 hours. CBG: No results for input(s): GLUCAP in the last 168 hours. Lipid Profile: No results for input(s): CHOL, HDL, LDLCALC, TRIG, CHOLHDL, LDLDIRECT in the last 72 hours. Thyroid  Function Tests: No results for input(s): TSH, T4TOTAL, FREET4, T3FREE, THYROIDAB in the last 72 hours. Anemia Panel: No results for input(s): VITAMINB12, FOLATE, FERRITIN, TIBC, IRON, RETICCTPCT in the last 72 hours. Sepsis Labs: No results for input(s): PROCALCITON, LATICACIDVEN in the last 168 hours.  No results found for this or any previous visit (from the past 240 hours).       Radiology Studies: No results found.         LOS: 9 days   Time spent= 35 mins    Burgess JAYSON Dare, MD [facility]  If 7PM-7AM, please contact night-coverage  05/23/2024, 1:17 PM

## 2024-05-23 NOTE — Progress Notes (Signed)
   05/23/24 1725  Vitals  Temp 97.7 F (36.5 C)  Pulse Rate 78  Resp 20  BP 99/68  SpO2 99 %  O2 Device Room Air  Weight 136 kg  Type of Weight Post-Dialysis  Post Treatment  Dialyzer Clearance Lightly streaked  Hemodialysis Intake (mL) 0 mL  Liters Processed 108  Fluid Removed (mL) 1500 mL  Tolerated HD Treatment Yes  AVG/AVF Arterial Site Held (minutes) 10 minutes  AVG/AVF Venous Site Held (minutes) 10 minutes   Received patient in bed to unit.  Alert and oriented.  Informed consent signed and in chart.   TX duration:4.5hrs  Patient tolerated well.  Transported back to the room  Alert, without acute distress.  Hand-off given to patient's nurse.   Access used: LAVF Access issues: none  Total UF removed: 1.5L Medication(s) given: none   [facility]

## 2024-05-24 DIAGNOSIS — R310 Gross hematuria: Secondary | ICD-10-CM | POA: Diagnosis not present

## 2024-05-24 LAB — BASIC METABOLIC PANEL WITH GFR
Anion gap: 12 (ref 5–15)
BUN: 22 mg/dL — ABNORMAL HIGH (ref 6–20)
CO2: 29 mmol/L (ref 22–32)
Calcium: 9.4 mg/dL (ref 8.9–10.3)
Chloride: 94 mmol/L — ABNORMAL LOW (ref 98–111)
Creatinine, Ser: 6.6 mg/dL — ABNORMAL HIGH (ref 0.61–1.24)
GFR, Estimated: 9 mL/min — ABNORMAL LOW (ref >60)
Glucose, Bld: 128 mg/dL — ABNORMAL HIGH (ref 70–99)
Potassium: 4 mmol/L (ref 3.5–5.1)
Sodium: 135 mmol/L (ref 135–145)

## 2024-05-24 LAB — CBC
HCT: 25.7 % — ABNORMAL LOW (ref 39.0–52.0)
Hemoglobin: 8.3 g/dL — ABNORMAL LOW (ref 13.0–17.0)
MCH: 30.6 pg (ref 26.0–34.0)
MCHC: 32.3 g/dL (ref 30.0–36.0)
MCV: 94.8 fL (ref 80.0–100.0)
Platelets: 235 K/uL (ref 150–400)
RBC: 2.71 MIL/uL — ABNORMAL LOW (ref 4.22–5.81)
RDW: 15.5 % (ref 11.5–15.5)
WBC: 5.1 K/uL (ref 4.0–10.5)
nRBC: 0 % (ref 0.0–0.2)

## 2024-05-24 LAB — MAGNESIUM: Magnesium: 1.9 mg/dL (ref 1.7–2.4)

## 2024-05-24 LAB — MRSA NEXT GEN BY PCR, NASAL: MRSA by PCR Next Gen: DETECTED — AB

## 2024-05-24 MED ORDER — MUPIROCIN 2 % EX OINT
1.0000 | TOPICAL_OINTMENT | Freq: Two times a day (BID) | CUTANEOUS | Status: DC
  Administered 2024-05-24: 1 via NASAL
  Filled 2024-05-24: qty 22

## 2024-05-24 MED ORDER — ORAL CARE MOUTH RINSE: 15.0000 mL | OROMUCOSAL | Status: DC | PRN

## 2024-05-24 NOTE — Progress Notes (Signed)
 PROGRESS NOTE    Devin Bullock  FMW:[id number] DOB: 01/01/1966 DOA: 05/13/2024 PCP: [facility]    Brief Narrative:    58 y.o. male with medical history significant for ESRD on MWF HD, prostate and bladder cancer s/p TURBT 2022, anemia of CKD, paroxysmal SVT, history of spontaneous right perinephric hematoma (June 2025) who presented to the ED for evaluation of hematuria with large blood clots for 2 days associated with suprapubic and urethral pain.  Previously admitted in June for acute blood loss anemia from spontaneous right perinephric bleed requiring 4 units PRBC transfusion follows with urology and nephrology at [facility] in [place].  Underwent cystoscopy 9/4 which showed bladder tumor and is scheduled for TURBT on 06/21/2024. CT abdomen pelvis with contrast shows bilateral severe hydroureteronephrosis unchanged from prior exam, chronic bladder wall thickening.  Seen by urology underwent cystoscopy with no definitive source.  During this hospitalization has required PRBC transfusion.  Declining transfer to [facility] for discharge home.  Assessment & Plan:  Gross hematuria, ongoing  Acute blood loss anemia history of prostate/bladder cancer and spontaneous right perinephric hematoma in June 2025/gross hematuria: CTA and cystoscopy unable to locate the source of bleeding, concern of ureteral source based on clot morphology and a clear bladder on cystoscopy. Patient is high risk due to history of bladder cancer. Unable to tolerate Foley placement and manual irrigation.  Per urology, they are unable to offer any other intervention to the patient and recommended that patient be transferred to [facility] or go home and follow-up with them as outpatient.  Declining transfer to [facility]. -Status post 3 unit PRBC transfusion. Last tx 10/5 -Hemoglobin around 7.8. Urology to see the patient here today    Rectal bleeding  some blood in stool and  wipes. Pt reports hemorrhoidal bleed in the past. Will continue anusol . Check Hemoglobin in the am   Hyperlipidemia  Continue atorvastatin .   History of SVT  Patient had 1 episode of SVT in the ED which was treated with his oral metoprolol .  Currently tolerating metoprolol  12.5 twice daily.   Heart failure with preserved ejection fraction 55%  Appears stable.  Continue Lopressor .   Hypotension  Patient on midodrine .   ESRD on HD  Monday Wednesday Friday schedule.  Management per nephrology.   Class II obesity  BMI almost 38.  Weight loss and diet modification counseled.   DVT prophylaxis: SCDs Start: 05/13/24 2350      Code Status: Full Code Family Communication:   Status is: Inpatient Remains inpatient appropriate because: Declining discharge due to his concerns of bleeding.  Declining transfer to [facility] as [facility] is too far for him   PT Follow up Recs:   Subjective: Patient seen and examined at bedside.  Does not have any complaints.  He really understands he needs to go to a tertiary care center such as [place]/[facility] to continue with his ongoing previously established GU care.  But also continues to ask me to get him answers here.  I explained to him that he needs to follow-up with his outpatient urologist for definitive care.  Denies any hematuria or hematochezia today.  Examination:  General exam: Appears calm and comfortable  Respiratory system: Clear to auscultation. Respiratory effort normal. Cardiovascular system: S1 & S2 heard, RRR. No JVD, murmurs, rubs, gallops or clicks. No pedal edema. Gastrointestinal system: Abdomen is nondistended, soft and nontender. No organomegaly or masses felt. Normal bowel sounds heard. Central nervous system: Alert and oriented. No  focal neurological deficits. Extremities: Symmetric 5 x 5 power. Skin: No rashes, lesions or ulcers Psychiatry: Judgement and insight appear normal. Mood & affect appropriate.                 Diet Orders (From admission, onward)     Start     Ordered   05/18/24 1259  Diet renal with fluid restriction Fluid restriction: 1200 mL Fluid; Room service appropriate? Yes; Fluid consistency: Thin  Diet effective now       Question Answer Comment  Fluid restriction: 1200 mL Fluid   Room service appropriate? Yes   Fluid consistency: Thin      05/18/24 1259            Objective: Vitals:   05/23/24 2020 05/24/24 0002 05/24/24 0802 05/24/24 1103  BP: 108/61 106/67 104/77 95/63  Pulse:   84   Resp: 19 20 18    Temp: 98.1 F (36.7 C) 98.2 F (36.8 C) 97.6 F (36.4 C)   TempSrc: Oral Oral Oral   SpO2: 99%  95%   Weight:      Height:        Intake/Output Summary (Last 24 hours) at 05/24/2024 1207 Last data filed at 05/24/2024 0700 Gross per 24 hour  Intake 120 ml  Output 1500 ml  Net -1380 ml   Filed Weights   05/23/24 0445 05/23/24 1235 05/23/24 1725  Weight: 135.4 kg (!) 137.3 kg 136 kg    Scheduled Meds:  sodium chloride    Intravenous Once   atorvastatin   40 mg Oral Daily   Chlorhexidine  Gluconate Cloth  6 each Topical Q0600   cinacalcet   60 mg Oral Q breakfast   darbepoetin (ARANESP ) injection - DIALYSIS  150 mcg Subcutaneous Q Mon-1800   hydrocortisone   25 mg Rectal BID   metoprolol  tartrate  5 mg Intravenous Once   metoprolol  tartrate  12.5 mg Oral BID   midodrine   5 mg Oral Q M,W,F-HD   midodrine   5 mg Oral Once in dialysis   sevelamer  carbonate  2,400 mg Oral TID WC   Continuous Infusions:  Nutritional status     Body mass index is 36.5 kg/m.  Data Reviewed:   CBC: Recent Labs  Lab 05/18/24 0323 05/19/24 0401 05/20/24 1127 05/20/24 2138 05/21/24 0729 05/22/24 1031 05/23/24 0325 05/24/24 0256  WBC 6.7 4.7  --   --  5.5  --   --  5.1  NEUTROABS 4.4 3.0  --   --   --   --   --   --   HGB 8.3* 7.5*   < > 7.5* 7.4* 7.8* 7.8* 8.3*  HCT 26.0* 23.4*   < > 23.1* 22.5* 23.5* 23.8* 25.7*  MCV 95.2 95.9  --   --  93.4   --   --  94.8  PLT 160 156  --   --  145*  --   --  235   < > = values in this interval not displayed.   Basic Metabolic Panel: Recent Labs  Lab 05/19/24 0401 05/21/24 0729 05/24/24 0256  NA 136 132* 135  K 4.3 4.0 4.0  CL 96* 93* 94*  CO2 28 23 29   GLUCOSE 92 94 128*  BUN 30* 49* 22*  CREATININE 7.25* 11.71* 6.60*  CALCIUM  8.7* 8.6* 9.4  MG  --   --  1.9  PHOS  --  5.8*  --    GFR: Estimated Creatinine Clearance: 17.9 mL/min (A) (by C-G formula based on  SCr of 6.6 mg/dL (H)). Liver Function Tests: Recent Labs  Lab 05/21/24 0729  ALBUMIN  2.7*   No results for input(s): LIPASE, AMYLASE in the last 168 hours. No results for input(s): AMMONIA in the last 168 hours. Coagulation Profile: No results for input(s): INR, PROTIME in the last 168 hours. Cardiac Enzymes: No results for input(s): CKTOTAL, CKMB, CKMBINDEX, TROPONINI in the last 168 hours. BNP (last 3 results) No results for input(s): PROBNP in the last 8760 hours. HbA1C: No results for input(s): HGBA1C in the last 72 hours. CBG: No results for input(s): GLUCAP in the last 168 hours. Lipid Profile: No results for input(s): CHOL, HDL, LDLCALC, TRIG, CHOLHDL, LDLDIRECT in the last 72 hours. Thyroid  Function Tests: No results for input(s): TSH, T4TOTAL, FREET4, T3FREE, THYROIDAB in the last 72 hours. Anemia Panel: No results for input(s): VITAMINB12, FOLATE, FERRITIN, TIBC, IRON, RETICCTPCT in the last 72 hours. Sepsis Labs: No results for input(s): PROCALCITON, LATICACIDVEN in the last 168 hours.  No results found for this or any previous visit (from the past 240 hours).       Radiology Studies: No results found.         LOS: 10 days   Time spent= 35 mins    Burgess JAYSON Dare, MD [facility]  If 7PM-7AM, please contact night-coverage  05/24/2024, 12:07 PM

## 2024-05-24 NOTE — Telephone Encounter (Signed)
-----   Message from Sherran Moats, NP sent at 05/23/2024 11:55 AM EDT ----- Regarding: RE: GI asking for clearance for colonoscopy We have no restrictions from a urology standpoint  Thank you, Sherran Moats NP-C ----- Message ----- From: Avelina DELENA Sharps, RN Sent: 05/22/2024   1:03 PM EDT To: Sherran Adele Moats, NP Subject: RE: GI asking for clearance for colonoscopy    They want to know it's ok for him to get a colonoscopy. ----- Message ----- From: Sherran Adele Moats, NP Sent: 05/21/2024   9:58 AM EDT To: Avelina DELENA Sharps, RN Subject: RE: GI asking for clearance for colonoscopy    What type of clearance do they need from Urology?  Thank you, Sherran Moats NP-C ----- Message ----- From: Avelina DELENA Sharps, RN Sent: 05/18/2024  10:34 AM EDT To: Sherran Adele Moats, NP Subject: GI asking for clearance for colonoscopy        GI practice calling for clearance for colonoscopy. Please advise, Thanks, [employer]

## 2024-05-24 NOTE — Telephone Encounter (Signed)
 Reviewed [facility] notes, clearance is pending.

## 2024-05-24 NOTE — Progress Notes (Signed)
 [facility] Progress Note   Subjective:   Patient seen and examined at bedside. S/p HD yesterday with 1.5L off.  Did well.    Objective Vitals:   05/23/24 1803 05/23/24 2020 05/24/24 0002 05/24/24 0802  BP: 106/72 108/61 106/67 104/77  Pulse: 80   84  Resp: 19 19 20 18   Temp: 98.2 F (36.8 C) 98.1 F (36.7 C) 98.2 F (36.8 C) 97.6 F (36.4 C)  TempSrc: Oral Oral Oral Oral  SpO2: 96% 99%  95%  Weight:      Height:       Physical Exam General:well appearing male in NAD Heart:RRR Lungs:nml WOB on RA Abdomen:soft, NTND Extremities:no LE edema Dialysis Access: LU AVF +b/t   Filed Weights   05/23/24 0445 05/23/24 1235 05/23/24 1725  Weight: 135.4 kg (!) 137.3 kg 136 kg    Intake/Output Summary (Last 24 hours) at 05/24/2024 1111 Last data filed at 05/24/2024 0700 Gross per 24 hour  Intake 120 ml  Output 1500 ml  Net -1380 ml     Additional Objective Labs: Basic Metabolic Panel: Recent Labs  Lab 05/19/24 0401 05/21/24 0729 05/24/24 0256  NA 136 132* 135  K 4.3 4.0 4.0  CL 96* 93* 94*  CO2 28 23 29   GLUCOSE 92 94 128*  BUN 30* 49* 22*  CREATININE 7.25* 11.71* 6.60*  CALCIUM  8.7* 8.6* 9.4  PHOS  --  5.8*  --    Liver Function Tests: Recent Labs  Lab 05/21/24 0729  ALBUMIN  2.7*   No results for input(s): LIPASE, AMYLASE in the last 168 hours. CBC: Recent Labs  Lab 05/18/24 0323 05/19/24 0401 05/20/24 1127 05/21/24 0729 05/22/24 1031 05/23/24 0325 05/24/24 0256  WBC 6.7 4.7  --  5.5  --   --  5.1  NEUTROABS 4.4 3.0  --   --   --   --   --   HGB 8.3* 7.5*   < > 7.4* 7.8* 7.8* 8.3*  HCT 26.0* 23.4*   < > 22.5* 23.5* 23.8* 25.7*  MCV 95.2 95.9  --  93.4  --   --  94.8  PLT 160 156  --  145*  --   --  235   < > = values in this interval not displayed.    Medications:    sodium chloride    Intravenous Once   atorvastatin   40 mg Oral Daily   Chlorhexidine  Gluconate Cloth  6 each Topical Q0600   cinacalcet   60 mg Oral Q  breakfast   darbepoetin (ARANESP ) injection - DIALYSIS  150 mcg Subcutaneous Q Mon-1800   hydrocortisone   25 mg Rectal BID   metoprolol  tartrate  5 mg Intravenous Once   metoprolol  tartrate  12.5 mg Oral BID   midodrine   5 mg Oral Q M,W,F-HD   midodrine   5 mg Oral Once in dialysis   sevelamer  carbonate  2,400 mg Oral TID WC    Dialysis Orders: MWF NW 4.5h   B450   136kg   2K bath   AVF  Heparin  none - Mircera 150 q 2 weeks (last 9/22, due 10/06) - Calcitriol  2.73mcg PO q HD - Outpatient Hgb trends: 8.8 (9/9), 7.3 (9/17), 7.4 (9/24)     Assessment/Plan:  Gross hematuria with clots: complicated situation, etiology unclear. Hx of bladder and prostate Cancer.  Followed by Urology at [facility].  [facility], unable to offer intervention and recommended he transfer to [facility] or d/c home and f/u with them as outpatient. IR  consulted for possible renal embolization but did not recommend embolization at this time as they do not see a specific target area to embolize.  Pt declines transfer to [facility] or be discharged ESRD: HD MWF. Next HD 05/25/24.  Run time is 4 hrs 30 min.  Hx SVT: had high HR event , rec'd IV adenosine  and rapid response.  Hypertension - BP low normal.  on metoprolol  12.5 bid and midodrine  pre HD three times per week with HD.   Acute on chronic anemia of CKD: 2/2 #1. 2 units pRBC given so far. Transfusing prn. Hgb 6.9 05/20/24, 1 unit pRBC ordered. Next esa due 10/6. Volume: Under EDW if weights correct.  Does not appear volume overloaded.  UF as tolerated.   Metabolic bone disease: Calcium  and phos in goal, continue home meds. Nutrition - renal diet w/fluid restrictions.  H/o R perinephric hematoma: June 2025, w/ hydronephrosis and clot in the renal pelvis. Rec'd 3u prbc's and 1g TXA Dispo: Pending  Almarie Bonine MD [facility] 05/24/2024,11:11 AM  LOS: 10 days

## 2024-05-25 ENCOUNTER — Other Ambulatory Visit (HOSPITAL_COMMUNITY): Payer: Self-pay

## 2024-05-25 DIAGNOSIS — R310 Gross hematuria: Secondary | ICD-10-CM | POA: Diagnosis not present

## 2024-05-25 LAB — CBC
HCT: 25 % — ABNORMAL LOW (ref 39.0–52.0)
Hemoglobin: 7.9 g/dL — ABNORMAL LOW (ref 13.0–17.0)
MCH: 30 pg (ref 26.0–34.0)
MCHC: 31.6 g/dL (ref 30.0–36.0)
MCV: 95.1 fL (ref 80.0–100.0)
Platelets: 239 K/uL (ref 150–400)
RBC: 2.63 MIL/uL — ABNORMAL LOW (ref 4.22–5.81)
RDW: 15.3 % (ref 11.5–15.5)
WBC: 5.5 K/uL (ref 4.0–10.5)
nRBC: 0 % (ref 0.0–0.2)

## 2024-05-25 LAB — MAGNESIUM: Magnesium: 2 mg/dL (ref 1.7–2.4)

## 2024-05-25 LAB — BASIC METABOLIC PANEL WITH GFR
Anion gap: 14 (ref 5–15)
BUN: 38 mg/dL — ABNORMAL HIGH (ref 6–20)
CO2: 27 mmol/L (ref 22–32)
Calcium: 9.3 mg/dL (ref 8.9–10.3)
Chloride: 95 mmol/L — ABNORMAL LOW (ref 98–111)
Creatinine, Ser: 8.92 mg/dL — ABNORMAL HIGH (ref 0.61–1.24)
GFR, Estimated: 6 mL/min — ABNORMAL LOW (ref >60)
Glucose, Bld: 77 mg/dL (ref 70–99)
Potassium: 5 mmol/L (ref 3.5–5.1)
Sodium: 136 mmol/L (ref 135–145)

## 2024-05-25 MED ORDER — HYDROCORTISONE (PERIANAL) 2.5 % EX CREA
1.0000 | TOPICAL_CREAM | Freq: Two times a day (BID) | CUTANEOUS | 0 refills | Status: DC
  Filled 2024-05-25: qty 30, 10d supply, fill #0

## 2024-05-25 MED ORDER — MIDODRINE HCL 5 MG PO TABS
ORAL_TABLET | ORAL | Status: AC
  Filled 2024-05-25: qty 1

## 2024-05-25 NOTE — Discharge Instructions (Addendum)
 Advised to follow-up with [facility] for further urologic care intervention.  Currently he is scheduled for TURBT on June 21, 2024.  In the meantime advised to follow-up with outpatient nephrology and PCP as well. Within next 1-2 weeks get repeat blood work including BMP, CBC and magnesium levels with PCP

## 2024-05-25 NOTE — Progress Notes (Signed)
 No active signs of any form of bleeding at this time. Hb has remained stable since his last PRBC tx on 10/5.  I have explained and drawn diagrams for the patient regarding GU system and how Bladder and Kidney are connected. Also explained him why he needs to undergo TURBT. I have also explained him during my rounds, hematuria may recur anytime unpredictable until his issues are addressed by  outpatient Urology team at the [facility].  Patient has declined tx to [facility] for adv Urologic needs.  At this time there is no need or indication of acute urology intervention. I have discussed this with our Urology team on daily basis, including today. Appreciate their input.   Plan would still be to discharge him with outpatient follow up.  If any form of bleeding recurs, he is to contact his outpatient provider or return to the nearest ER for evaluation.   Burgess Dare MD

## 2024-05-25 NOTE — Progress Notes (Signed)
 Received patient in bed to unit.  Alert and oriented x4 Informed consent signed and in chart.   TX duration:3 hours  Patient tolerated well, pt requested UF only as per pt. He is already pass his dry weight. Md informed and agreed. And pt requested to be 3 hours only during tx  Transported back to the room  Alert, without acute distress.  Hand-off given to patient's nurse.   Access used: AVF Access issues: none  Total UF removed: 0 Medication(s) given: none Post HD VS: see table below Post HD weight: 135.9kg   05/25/24 1130  Vitals  BP 116/65  MAP (mmHg) 81  BP Location Right Arm  BP Method Automatic  Patient Position (if appropriate) Lying  Pulse Rate 81  Pulse Rate Source Monitor  ECG Heart Rate 82  Resp (!) 21  Oxygen Therapy  SpO2 100 %  O2 Device Room Air  Patient Activity (if Appropriate) In bed  Pulse Oximetry Type Continuous  During Treatment Monitoring  Blood Flow Rate (mL/min) 400 mL/min  Arterial Pressure (mmHg) 53.13 mmHg  Venous Pressure (mmHg) -11.31 mmHg  TMP (mmHg) 9.7 mmHg  Ultrafiltration Rate (mL/min) 67 mL/min  Dialysate Flow Rate (mL/min) 300 ml/min  Dialysate Potassium Concentration 2  Dialysate Calcium  Concentration 2.5  Duration of HD Treatment -hour(s) 3.18 hour(s)  Cumulative Fluid Removed (mL) per Treatment  -87.97  HD Safety Checks Performed Yes  Intra-Hemodialysis Comments Tolerated well  Post Treatment  Hemodialysis Intake (mL) 0 mL  Liters Processed 0  Fluid Removed (mL) 76.3 mL  Tolerated HD Treatment Yes  Post-Hemodialysis Comments tx cut short pt only wanted 3 hours  Fistula / Graft Left Upper arm Arteriovenous fistula  Placement Date/Time: 05/14/24 0000   Placed prior to admission: Yes  Orientation: Left  Access Location: Upper arm  Access Type: Arteriovenous fistula  Site Condition No complications  Fistula / Graft Assessment Present;Thrill;Bruit  Status Deaccessed;Flushed;Patent      Lorrene KANDICE [facility]

## 2024-05-25 NOTE — Procedures (Signed)
 Patient seen and examined on Hemodialysis. The procedure was supervised and I have made appropriate changes. BP 117/66 (BP Location: Right Arm)   Pulse 80   Temp 97.8 F (36.6 C) (Oral)   Resp (!) 24   Ht 6' 4 (1.93 m)   Wt 135.2 kg   SpO2 100%   BMI 36.28 kg/m   QB 400 mL/min via AVF, UF goal even per pt request.    Tolerating treatment without complaints at this time.   Devin Bonine MD [facility] [phone] 10:37 AM

## 2024-05-25 NOTE — Progress Notes (Signed)
 Went to patient room regarding discharge  patient wants to see provider before leaves  provider has extensively spoken with patient but patient states that his hemoglobin is still gong down even today and that he doesn't want to have to turn around and come back.  Patient believes is bleeding from the kidneys and has nothing to do with the bladder which he as a procedure scheduled with [facility] Nov 6th.    Floor nurse is messaging the provider.

## 2024-05-25 NOTE — TOC Transition Note (Signed)
 Transition of Care (TOC) - Discharge Note Rayfield Gobble RN, BSN Inpatient Care Management Unit 4E- RN Case Manager See Treatment Team for direct phone #   Patient Details  Name: Devin Bullock MRN: [id number] Date of Birth: 1966-04-27  Transition of Care [facility]) CM/SW Contact:  Gobble Rayfield Hurst, RN Phone Number: 05/25/2024, 2:16 PM   Clinical Narrative:    Per MD pt stable for transition home, has follow up with [facility] on Nov. 6.   No HH or DME needs noted.   Pt voicing to unit staff that he does not feel ready to go home- pt has Managed Medicaid and no right to appeal discharge.    Final next level of care: Home/Self Care Barriers to Discharge: Barriers Resolved   Patient Goals and CMS Choice Patient states their goals for this hospitalization and ongoing recovery are:: wants to know why he is bleeding   Choice offered to / list presented to : NA      Discharge Placement                 Home      Discharge Plan and Services Additional resources added to the After Visit Summary for   In-house Referral: NA Discharge Planning Services: NA Post Acute Care Choice: NA          DME Arranged: N/A DME Agency: NA       HH Arranged: NA HH Agency: NA        Social Drivers of Health (SDOH) Interventions SDOH Screenings   Food Insecurity: Food Insecurity Present (05/14/2024)  Housing: Low Risk  (05/14/2024)  Transportation Needs: No Transportation Needs (05/14/2024)  Utilities: Not At Risk (05/14/2024)  Tobacco Use: Low Risk  (05/19/2024)     Readmission Risk Interventions    05/25/2024    2:16 PM  Readmission Risk Prevention Plan  Transportation Screening Complete  HRI or Home Care Consult Complete  Social Work Consult for Recovery Care Planning/Counseling Complete  Palliative Care Screening Not Applicable  Medication Review Ocean[employer]) Complete

## 2024-05-25 NOTE — Progress Notes (Addendum)
 PROGRESS NOTE    Devin Bullock  FMW:[id number] DOB: [birth year] DOA: 05/13/2024 PCP: [facility]    Brief Narrative:    58 y.o. male with medical history significant for ESRD on MWF HD, prostate and bladder cancer s/p TURBT 2022, anemia of CKD, paroxysmal SVT, history of spontaneous right perinephric hematoma (June 2025) who presented to the ED for evaluation of hematuria with large blood clots for 2 days associated with suprapubic and urethral pain.  Previously admitted in June for acute blood loss anemia from spontaneous right perinephric bleed requiring 4 units PRBC transfusion follows with urology and nephrology at [facility] in [place].  Underwent cystoscopy 9/4 which showed bladder tumor and is scheduled for TURBT on 06/21/2024. CT abdomen pelvis with contrast shows bilateral severe hydroureteronephrosis unchanged from prior exam, chronic bladder wall thickening.  Seen by urology underwent cystoscopy with no definitive source.  During this hospitalization has required PRBC transfusion.  Declining transfer to [facility] for discharge home.  Patient is tolerating HD well today.  He has been advised that he will need to follow-up with [facility] at this time.  Currently no acute medical intervention planned by urology here and no indication to keep him hospitalized.  Several discussions with him regarding importance of keeping his appointment with [facility] for his TURBT planned on June 21, 2024.  Assessment & Plan:  Gross hematuria, resolved. Acute blood loss anemia history of prostate/bladder cancer and spontaneous right perinephric hematoma in June 2025/gross hematuria: CTA and cystoscopy unable to locate the source of bleeding, concern of ureteral source based on clot morphology and a clear bladder on cystoscopy.  There is a high risk for bladder cancer therefore he has TURBT planned with [facility] on November 6025.  He needs to keep this appointment.   Prior to that he will see outpatient primary care physician and nephrolog -She did receive 3 units of PRBC transfusion on 9/29, 10/1 and 10/5.  Hemoglobin now remains at 7.9 without any obvious evidence of bleeding.  -Have discussed this with [facility] provider, Ole Bras, multiple times during this hospitalization. And I appreciate their input.  There is no acute indication for intervention at this time and he would have to follow-up with his outpatient urology at [facility].    Rectal bleeding; resolved.   some blood in stool and wipes. Pt reports hemorrhoidal bleed in the past.  Hemoglobin stable around 8.0   Hyperlipidemia  Continue atorvastatin .   History of SVT  Patient had 1 episode of SVT in the ED which was treated with his oral metoprolol .  Currently tolerating metoprolol  12.5 twice daily.   Heart failure with preserved ejection fraction 55%  Appears stable.  Continue Lopressor .   Hypotension  Patient on midodrine .   ESRD on HD  Monday Wednesday Friday schedule.  Management per nephrology.   Class II obesity  BMI almost 38.  Weight loss and diet modification counseled.   DVT prophylaxis: SCDs Start: 05/13/24 2350    Code Status: Full Code Family Communication:   Status is: Inpatient Remains inpatient appropriate because: Discharge today   PT Follow up Recs:   Subjective: Seen during HD.  No complaints at this time. I been very clear to him that at this time urology team has nothing to offer here and for definitive treatment he will follow-up with [facility] as previously planned TURBT  Examination:  General exam: Appears calm and comfortable  Respiratory system: Clear to auscultation. Respiratory effort normal. Cardiovascular system: S1 &  S2 heard, RRR. No JVD, murmurs, rubs, gallops or clicks. No pedal edema. Gastrointestinal system: Abdomen is nondistended, soft and nontender. No organomegaly or masses felt. Normal bowel sounds heard. Central  nervous system: Alert and oriented. No focal neurological deficits. Extremities: Symmetric 5 x 5 power. Skin: No rashes, lesions or ulcers Psychiatry: Judgement and insight appear normal. Mood & affect appropriate.                Diet Orders (From admission, onward)     Start     Ordered   05/18/24 1259  Diet renal with fluid restriction Fluid restriction: 1200 mL Fluid; Room service appropriate? Yes; Fluid consistency: Thin  Diet effective now       Question Answer Comment  Fluid restriction: 1200 mL Fluid   Room service appropriate? Yes   Fluid consistency: Thin      05/18/24 1259            Objective: Vitals:   05/25/24 1100 05/25/24 1124 05/25/24 1125 05/25/24 1130  BP: 130/60 128/74 128/74 116/65  Pulse: 77 82 81 81  Resp: 20 (!) 24 (!) 21 (!) 21  Temp:   98.1 F (36.7 C) 98.2 F (36.8 C)  TempSrc:    Oral  SpO2: 100% 100% 100% 100%  Weight:    135.9 kg  Height:        Intake/Output Summary (Last 24 hours) at 05/25/2024 1245 Last data filed at 05/25/2024 1130 Gross per 24 hour  Intake 240 ml  Output 76.3 ml  Net 163.7 ml   Filed Weights   05/25/24 0538 05/25/24 0758 05/25/24 1130  Weight: 135.2 kg 135.2 kg 135.9 kg    Scheduled Meds:  sodium chloride    Intravenous Once   atorvastatin   40 mg Oral Daily   Chlorhexidine  Gluconate Cloth  6 each Topical Q0600   cinacalcet   60 mg Oral Q breakfast   darbepoetin (ARANESP ) injection - DIALYSIS  150 mcg Subcutaneous Q Mon-1800   hydrocortisone   25 mg Rectal BID   metoprolol  tartrate  5 mg Intravenous Once   metoprolol  tartrate  12.5 mg Oral BID   midodrine   5 mg Oral Q M,W,F-HD   midodrine   5 mg Oral Once in dialysis   mupirocin  ointment  1 Application Nasal BID   sevelamer  carbonate  2,400 mg Oral TID WC   Continuous Infusions:  Nutritional status     Body mass index is 36.47 kg/m.  Data Reviewed:   CBC: Recent Labs  Lab 05/19/24 0401 05/20/24 1127 05/21/24 0729 05/22/24 1031  05/23/24 0325 05/24/24 0256 05/25/24 0314  WBC 4.7  --  5.5  --   --  5.1 5.5  NEUTROABS 3.0  --   --   --   --   --   --   HGB 7.5*   < > 7.4* 7.8* 7.8* 8.3* 7.9*  HCT 23.4*   < > 22.5* 23.5* 23.8* 25.7* 25.0*  MCV 95.9  --  93.4  --   --  94.8 95.1  PLT 156  --  145*  --   --  235 239   < > = values in this interval not displayed.   Basic Metabolic Panel: Recent Labs  Lab 05/19/24 0401 05/21/24 0729 05/24/24 0256 05/25/24 0314  NA 136 132* 135 136  K 4.3 4.0 4.0 5.0  CL 96* 93* 94* 95*  CO2 28 23 29 27   GLUCOSE 92 94 128* 77  BUN 30* 49* 22* 38*  CREATININE  7.25* 11.71* 6.60* 8.92*  CALCIUM  8.7* 8.6* 9.4 9.3  MG  --   --  1.9 2.0  PHOS  --  5.8*  --   --    GFR: Estimated Creatinine Clearance: 13.3 mL/min (A) (by C-G formula based on SCr of 8.92 mg/dL (H)). Liver Function Tests: Recent Labs  Lab 05/21/24 0729  ALBUMIN  2.7*   No results for input(s): LIPASE, AMYLASE in the last 168 hours. No results for input(s): AMMONIA in the last 168 hours. Coagulation Profile: No results for input(s): INR, PROTIME in the last 168 hours. Cardiac Enzymes: No results for input(s): CKTOTAL, CKMB, CKMBINDEX, TROPONINI in the last 168 hours. BNP (last 3 results) No results for input(s): PROBNP in the last 8760 hours. HbA1C: No results for input(s): HGBA1C in the last 72 hours. CBG: No results for input(s): GLUCAP in the last 168 hours. Lipid Profile: No results for input(s): CHOL, HDL, LDLCALC, TRIG, CHOLHDL, LDLDIRECT in the last 72 hours. Thyroid  Function Tests: No results for input(s): TSH, T4TOTAL, FREET4, T3FREE, THYROIDAB in the last 72 hours. Anemia Panel: No results for input(s): VITAMINB12, FOLATE, FERRITIN, TIBC, IRON, RETICCTPCT in the last 72 hours. Sepsis Labs: No results for input(s): PROCALCITON, LATICACIDVEN in the last 168 hours.  Recent Results (from the past 240 hours)  MRSA Next Gen by PCR,  Nasal     Status: Abnormal   Collection Time: 05/24/24 11:43 AM   Specimen: Nasal Mucosa; Nasal Swab  Result Value Ref Range Status   MRSA by PCR Next Gen DETECTED (A) NOT DETECTED Final    Comment: RESULT CALLED TO, READ BACK BY AND VERIFIED WITH: RN LATOYA THOMPSON ON 05/24/24 @ 2016 BY DRT (NOTE) The GeneXpert MRSA Assay (FDA approved for NASAL specimens only), is one component of a comprehensive MRSA colonization surveillance program. It is not intended to diagnose MRSA infection nor to guide or monitor treatment for MRSA infections. Test performance is not FDA approved in patients less than 87 years old. Performed at [facility], [address] [place], [place] [address]          Radiology Studies: No results found.         LOS: 11 days   Time spent= 35 mins    Burgess JAYSON Dare, MD [facility]  If 7PM-7AM, please contact night-coverage  05/25/2024, 12:45 PM

## 2024-05-25 NOTE — Progress Notes (Signed)
 Devin Bullock Progress Note   Subjective:   Patient seen and examined at bedside.  Hbg 7.9 today, for possible discharge.    Objective Vitals:   05/25/24 0900 05/25/24 0930 05/25/24 1000 05/25/24 1030  BP: 105/72 109/67 108/65 117/66  Pulse: 83 80 (!) 110 80  Resp: (!) 26 (!) 22 (!) 24 (!) 24  Temp:      TempSrc:      SpO2: 99% 99% 99% 100%  Weight:      Height:       Physical Exam General:well appearing male in NAD Heart:RRR Lungs:nml WOB on RA Abdomen:soft, NTND Extremities:no LE edema Dialysis Access: LU AVF +b/t   Filed Weights   05/23/24 1725 05/25/24 0538 05/25/24 0758  Weight: 136 kg 135.2 kg 135.2 kg    Intake/Output Summary (Last 24 hours) at 05/25/2024 1035 Last data filed at 05/24/2024 1700 Gross per 24 hour  Intake 240 ml  Output --  Net 240 ml     Additional Objective Labs: Basic Metabolic Panel: Recent Labs  Lab 05/21/24 0729 05/24/24 0256 05/25/24 0314  NA 132* 135 136  K 4.0 4.0 5.0  CL 93* 94* 95*  CO2 23 29 27   GLUCOSE 94 128* 77  BUN 49* 22* 38*  CREATININE 11.71* 6.60* 8.92*  CALCIUM  8.6* 9.4 9.3  PHOS 5.8*  --   --    Liver Function Tests: Recent Labs  Lab 05/21/24 0729  ALBUMIN  2.7*   No results for input(s): LIPASE, AMYLASE in the last 168 hours. CBC: Recent Labs  Lab 05/19/24 0401 05/20/24 1127 05/21/24 0729 05/22/24 1031 05/23/24 0325 05/24/24 0256 05/25/24 0314  WBC 4.7  --  5.5  --   --  5.1 5.5  NEUTROABS 3.0  --   --   --   --   --   --   HGB 7.5*   < > 7.4*   < > 7.8* 8.3* 7.9*  HCT 23.4*   < > 22.5*   < > 23.8* 25.7* 25.0*  MCV 95.9  --  93.4  --   --  94.8 95.1  PLT 156  --  145*  --   --  235 239   < > = values in this interval not displayed.    Medications:    sodium chloride    Intravenous Once   atorvastatin   40 mg Oral Daily   Chlorhexidine  Gluconate Cloth  6 each Topical Q0600   cinacalcet   60 mg Oral Q breakfast   darbepoetin (ARANESP ) injection - DIALYSIS  150 mcg  Subcutaneous Q Mon-1800   hydrocortisone   25 mg Rectal BID   metoprolol  tartrate  5 mg Intravenous Once   metoprolol  tartrate  12.5 mg Oral BID   midodrine   5 mg Oral Q M,W,F-HD   midodrine   5 mg Oral Once in dialysis   mupirocin  ointment  1 Application Nasal BID   sevelamer  carbonate  2,400 mg Oral TID WC    Dialysis Orders: MWF NW 4.5h   B450   136kg   2K bath   AVF  Heparin  none - Mircera 150 q 2 weeks (last 9/22, due 10/06) - Calcitriol  2.22mcg PO q HD - Outpatient Hgb trends: 8.8 (9/9), 7.3 (9/17), 7.4 (9/24)     Assessment/Plan:  Gross hematuria with clots: complicated situation, etiology unclear. Hx of bladder and prostate Cancer.  Followed by Urology at [facility].  [facility], unable to offer intervention and recommended he transfer to [facility] or d/c home and  f/u with them as outpatient. IR consulted for possible renal embolization but did not recommend embolization at this time as they do not see a specific target area to embolize.  Pt declines transfer to [facility] or be discharged.  He does have 06/21/24 TURBT with [facility]. ESRD: HD MWF. Next HD 05/25/24.  Run time is 4 hrs 30 min.  Hx SVT: had high HR event , rec'd IV adenosine  and rapid response.  Hypertension - BP low normal.  on metoprolol  12.5 bid and midodrine  pre HD three times per week with HD.   Acute on chronic anemia of CKD: 2/2 #1. 2 units pRBC given so far. Transfusing prn. Hgb 6.9 05/20/24, 1 unit pRBC ordered. Next esa due 10/6. Volume: Under EDW if weights correct.  Does not appear volume overloaded.  UF as tolerated.   Metabolic bone disease: Calcium  and phos in goal, continue home meds. Nutrition - renal diet w/fluid restrictions.  H/o R perinephric hematoma: June 2025, w/ hydronephrosis and clot in the renal pelvis. Rec'd 3u prbc's and 1g TXA Dispo: Hopefully today  Almarie Bonine MD [facility] 05/25/2024,10:35 AM  LOS: 11 days

## 2024-05-25 NOTE — Progress Notes (Signed)
     Subjective: Spoke with Mr. Retz at length again today.  After agreeing to transfer to [facility] several days ago, he declined to transfer again.  Objective: Vital signs in last 24 hours: Temp:  [97.4 F (36.3 C)-98.2 F (36.8 C)] 98 F (36.7 C) (10/10 1246) Pulse Rate:  [77-110] 86 (10/10 1246) Resp:  [13-26] 19 (10/10 1246) BP: (100-130)/(59-74) 102/66 (10/10 1246) SpO2:  [95 %-100 %] 97 % (10/10 1246) Weight:  [135.2 kg-135.9 kg] 135.9 kg (10/10 1130)  Assessment/Plan: #gross hematuria, unclear source #bilateral hydronephrosis, R>L  1u PRBC on 9/30 and 10/2, and 10/5 respectively.  Unfortunately Mr. Mukai continues to believe that the option not to operate on him is punitive, and refuses to transfer in protest.  Infusing late, he is still not sure if he wants surgery.  The conversations wander and are inconsistent from one to the next.  I am not sure what his goals are.  He states that he wants to stop bleeding but refuses to participate in any of the options provided.  I have reviewed Dr. Haroldine original suggestion that bladder, prostate, and kidneys be removed d/t prostate and muscle invasive bladder cancer.  I have explained how that surgery is no longer available to him at this facility after radiation.  He is established and on the schedule with an academic institution, continue to advise him to follow-up with.  He is scheduled to discharge today, collect some personal effects that he wants to get from his house, and present directly to [facility].  CT hematuria without distinct masses. Bladder again decompressed and difficult to characterize. Hydronephrosis bilaterally R>L.  Further characterize with ureteroscopy, ideally during his scheduled TURBT  Reviewed case and plan with Dr. Alvaro   Intake/Output from previous day: 10/09 0701 - 10/10 0700 In: 240 [P.O.:240] Out: -   Intake/Output this shift: Total I/O In: -  Out: 76.3 [Other:76.3]  Physical Exam:  General:  Alert and oriented CV: No cyanosis Lungs: equal chest rise    Lab Results: Recent Labs    05/23/24 0325 05/24/24 0256 05/25/24 0314  HGB 7.8* 8.3* 7.9*  HCT 23.8* 25.7* 25.0*   BMET Recent Labs    05/24/24 0256 05/25/24 0314  NA 135 136  K 4.0 5.0  CL 94* 95*  CO2 29 27  GLUCOSE 128* 77  BUN 22* 38*  CREATININE 6.60* 8.92*  CALCIUM  9.4 9.3  HGB 8.3* 7.9*  WBC 5.1 5.5     Studies/Results: No results found.    LOS: 11 days   Ole Bourdon, NP [facility] Pager: [phone]  05/25/2024, 2:18 PM

## 2024-05-26 ENCOUNTER — Telehealth (HOSPITAL_COMMUNITY): Payer: Self-pay | Admitting: Nephrology

## 2024-05-26 NOTE — Telephone Encounter (Signed)
 Transition of Care - Initial Contact after Hospitalization  Date of discharge: 05/25/2024 Date of contact: 05/26/24  Method: Phone Spoke to: Patient  Patient contacted to discuss transition of care from recent inpatient hospitalization. Patient was admitted to [facility] from 9/28 - 05/25/24 with gross hematuria, symptomatic anemia.\  The discharge medication list was reviewed. Patient understands the changes and has no concerns.   Patient will return to his/her outpatient HD unit on: Monday.  No hematuria today - feels ok.  Reminded to let his usual urologist know about what happened thsi admit  No other concerns at this time.  Izetta Boehringer, PA-C [employer] Pager [phone]

## 2024-05-26 NOTE — Discharge Planning (Signed)
 [facility] Patient Discharge Orders - [facility] CLINIC: [email or web address]  Patient's name: Devin Bullock Admit/DC Dates: 05/13/2024 - 05/25/2024  DISCHARGE DIAGNOSES: Sheldon hematuria  Symptomatic anemia Hx SVT - treated with IV adenosine   HD ORDER CHANGES: Heparin  change: no - keep at none EDW Change: no  [place] Change: no  ANEMIA MANAGEMENT: Aranesp : Given: YES   Amount/Date of last dose: 150mcg on 05/21/24 ESA dose for discharge: mircera 200 mcg IV q 2 weeks, to start on 05/30/24 (See note below) IV Iron dose at discharge: Per protocol Transfusion: Given: YES, s/p 3U PRBCs this admit  BONE/MINERAL MEDICATIONS: Hectorol/Calcitriol  change: no  Sensipar /Parsabiv change: no  ACCESS INTERVENTION/CHANGE: no Details:  RECENT LABS: Recent Labs  Lab 05/21/24 0729 05/22/24 1031 05/25/24 0314  HGB 7.4*   < > 7.9*  NA 132*   < > 136  K 4.0   < > 5.0  CALCIUM  8.6*   < > 9.3  PHOS 5.8*  --   --   ALBUMIN  2.7*  --   --    < > = values in this interval not displayed.   IV ANTIBIOTICS: no  Details:  OTHER ANTICOAGULATION: no Details:  OTHER/APPTS/LAB ORDERS: - needs f/u with [facility] - ultimately needs surgery - have continued ESA given severe anemia despite his partial treated bladder cancer   D/C Meds to be reconciled by nurse after every discharge.  Completed By: Izetta Boehringer, PA-C [facility] Pager [phone]   Reviewed by: MD:______ RN_______

## 2024-05-26 NOTE — Discharge Summary (Signed)
 Physician Discharge Summary  Devin Bullock FMW:[id number] DOB: 1966/04/05 DOA: 05/13/2024  PCP: [facility]  Admit date: 05/13/2024 Discharge date: 05/25/2024  Admitted From: Home Disposition: Home  Recommendations for Outpatient Follow-up:  Follow up with PCP in 1-2 weeks Please obtain BMP/CBC in one week your next doctors visit.  Outpatient follow-up with urology at [facility] As outpatient follow-up with his PCP, nephrology and anesthesia coming up No evidence of any blood loss at the time of discharge.  Hemoglobin has been stable around 8.0.  No transfusion required since 10/5.  I have explained patient that he can have another episode of hematuria for which he will need to contact his urologist or return back to the ER if necessary.  There is no way for us  to protect when this may recur or not.   Discharge Condition: Stable CODE STATUS: Full code Diet recommendation: Renal  Brief/Interim Summary: Brief Narrative:    58 y.o. male with medical history significant for ESRD on MWF HD, prostate and bladder cancer s/p TURBT 2022, anemia of CKD, paroxysmal SVT, history of spontaneous right perinephric hematoma (June 2025) who presented to the ED for evaluation of hematuria with large blood clots for 2 days associated with suprapubic and urethral pain.  Previously admitted in June for acute blood loss anemia from spontaneous right perinephric bleed requiring 4 units PRBC transfusion follows with urology and nephrology at [facility] in [place].  Underwent cystoscopy 9/4 which showed bladder tumor and is scheduled for TURBT on 06/21/2024. CT abdomen pelvis with contrast shows bilateral severe hydroureteronephrosis unchanged from prior exam, chronic bladder wall thickening.  Seen by urology underwent cystoscopy with no definitive source.  During this hospitalization has required PRBC transfusion.  Declining transfer to [facility] for discharge home.  Patient is tolerating HD  well today.  He has been advised that he will need to follow-up with [facility] at this time.  Currently no acute medical intervention planned by urology here and no indication to keep him hospitalized.  Several discussions with him regarding importance of keeping his appointment with [facility] for his TURBT planned on June 21, 2024.  Assessment & Plan:  Gross hematuria, resolved. Acute blood loss anemia history of prostate/bladder cancer and spontaneous right perinephric hematoma in June 2025/gross hematuria: CTA and cystoscopy unable to locate the source of bleeding, concern of ureteral source based on clot morphology and a clear bladder on cystoscopy.  There is a high risk for bladder cancer therefore he has TURBT planned with [facility] on November 6025.  He needs to keep this appointment.  Prior to that he will see outpatient primary care physician and nephrolog -She did receive 3 units of PRBC transfusion on 9/29, 10/1 and 10/5.  Hemoglobin now remains at 7.9 without any obvious evidence of bleeding.  -Have discussed this with [facility], Ole Bras, multiple times during this hospitalization. And I appreciate their input.  There is no acute indication for intervention at this time and he would have to follow-up with his outpatient urology at [facility].    Rectal bleeding; resolved.   some blood in stool and wipes. Pt reports hemorrhoidal bleed in the past.  Hemoglobin stable around 8.0   Hyperlipidemia  Continue atorvastatin .   History of SVT  Patient had 1 episode of SVT in the ED which was treated with his oral metoprolol .  Currently tolerating metoprolol  12.5 twice daily.   Heart failure with preserved ejection fraction 55%  Appears stable.  Continue Lopressor .  Hypotension  Patient on midodrine .   ESRD on HD  Monday Wednesday Friday schedule.  Management per nephrology.   Class II obesity  BMI almost 38.  Weight loss and diet modification  counseled.   DVT prophylaxis: SCDs Start: 05/13/24 2350    Code Status: Full Code Family Communication:   Status is: Inpatient Remains inpatient appropriate because: Discharge today   PT Follow up Recs:   Subjective: Seen during HD.  No complaints at this time. I been very clear to him that at this time urology team has nothing to offer here and for definitive treatment he will follow-up with [facility] as previously planned TURBT  Examination:  General exam: Appears calm and comfortable  Respiratory system: Clear to auscultation. Respiratory effort normal. Cardiovascular system: S1 & S2 heard, RRR. No JVD, murmurs, rubs, gallops or clicks. No pedal edema. Gastrointestinal system: Abdomen is nondistended, soft and nontender. No organomegaly or masses felt. Normal bowel sounds heard. Central nervous system: Alert and oriented. No focal neurological deficits. Extremities: Symmetric 5 x 5 power. Skin: No rashes, lesions or ulcers Psychiatry: Judgement and insight appear normal. Mood & affect appropriate.    Discharge Diagnoses:  Principal Problem:   Gross hematuria Active Problems:   Bladder cancer (HCC)   Obesity, Class II, BMI 35-39.9   Anemia of chronic disease   ESRD (end stage renal disease) on dialysis [facility])   Essential hypertension   HFrEF (heart failure with reduced ejection fraction) (HCC)   Paroxysmal SVT (supraventricular tachycardia)   Hyperlipidemia      Discharge Exam: Vitals:   05/25/24 1130 05/25/24 1246  BP: 116/65 102/66  Pulse: 81 86  Resp: (!) 21 19  Temp: 98.2 F (36.8 C) 98 F (36.7 C)  SpO2: 100% 97%   Vitals:   05/25/24 1124 05/25/24 1125 05/25/24 1130 05/25/24 1246  BP: 128/74 128/74 116/65 102/66  Pulse: 82 81 81 86  Resp: (!) 24 (!) 21 (!) 21 19  Temp:  98.1 F (36.7 C) 98.2 F (36.8 C) 98 F (36.7 C)  TempSrc:   Oral Oral  SpO2: 100% 100% 100% 97%  Weight:   135.9 kg   Height:          Discharge Instructions   Allergies  as of 05/25/2024   No Known Allergies      Medication List     TAKE these medications    atorvastatin  40 MG tablet Commonly known as: LIPITOR Take 1 tablet (40 mg total) by mouth daily.   cinacalcet  60 MG tablet Commonly known as: SENSIPAR  Take 60 mg by mouth daily.   guaifenesin  100 MG/5ML syrup Commonly known as: ROBITUSSIN Take 200 mg by mouth 3 (three) times daily as needed for cough.   hydrocortisone  2.5 % rectal cream Commonly known as: ANUSOL -HC Place 1 Application rectally 2 (two) times daily.   metoprolol  tartrate 25 MG tablet Commonly known as: LOPRESSOR  Take 12.5 mg by mouth 2 (two) times daily.   midodrine  5 MG tablet Commonly known as: PROAMATINE  Take 5 mg by mouth. Taking Monday, Wednesday and Friday at dialysis   MIRCERA IJ Mircera   Rena-Vite Rx 1 MG Tabs Take 1 tablet by mouth daily.   sevelamer  carbonate 800 MG tablet Commonly known as: RENVELA  Take 2,400 mg by mouth 3 (three) times daily with meals. Take 1 tablet by mouth a snack        Follow-up Information     [facility] Follow up in 1 week(s).  Contact information: [address] [place] [address] [phone]                No Known Allergies  You were cared for by a hospitalist during your hospital stay. If you have any questions about your discharge medications or the care you received while you were in the hospital after you are discharged, you can call the unit and asked to speak with the hospitalist on call if the hospitalist that took care of you is not available. Once you are discharged, your primary care physician will handle any further medical issues. Please note that no refills for any discharge medications will be authorized once you are discharged, as it is imperative that you return to your primary care physician (or establish a relationship with a primary care physician if you do not have one) for your aftercare needs so that they can  reassess your need for medications and monitor your lab values.  You were cared for by a hospitalist during your hospital stay. If you have any questions about your discharge medications or the care you received while you were in the hospital after you are discharged, you can call the unit and asked to speak with the hospitalist on call if the hospitalist that took care of you is not available. Once you are discharged, your primary care physician will handle any further medical issues. Please note that NO REFILLS for any discharge medications will be authorized once you are discharged, as it is imperative that you return to your primary care physician (or establish a relationship with a primary care physician if you do not have one) for your aftercare needs so that they can reassess your need for medications and monitor your lab values.  Please request your Prim.MD to go over all Hospital Tests and Procedure/Radiological results at the follow up, please get all Hospital records sent to your Prim MD by signing hospital release before you go home.  Get CBC, CMP, 2 view Chest X ray checked  by Primary MD during your next visit or SNF MD in 5-7 days ( we routinely change or add medications that can affect your baseline labs and fluid status, therefore we recommend that you get the mentioned basic workup next visit with your PCP, your PCP may decide not to get them or add new tests based on their clinical decision)  On your next visit with your primary care physician please Get Medicines reviewed and adjusted.  If you experience worsening of your admission symptoms, develop shortness of breath, life threatening emergency, suicidal or homicidal thoughts you must seek medical attention immediately by calling 911 or calling your MD immediately  if symptoms less severe.  You Must read complete instructions/literature along with all the possible adverse reactions/side effects for all the Medicines you take and  that have been prescribed to you. Take any new Medicines after you have completely understood and accpet all the possible adverse reactions/side effects.   Do not drive, operate heavy machinery, perform activities at heights, swimming or participation in water  activities or provide baby sitting services if your were admitted for syncope or siezures until you have seen by Primary MD or a Neurologist and advised to do so again.  Do not drive when taking Pain medications.   Procedures/Studies: CT HEMATURIA WORKUP Result Date: 05/18/2024 CLINICAL DATA:  [id number] Gross hematuria [id number] EXAM: CT ABDOMEN AND PELVIS WITHOUT AND WITH CONTRAST TECHNIQUE: Multidetector CT imaging of the abdomen and pelvis was performed  following the standard protocol before and following the bolus administration of intravenous contrast. RADIATION DOSE REDUCTION: This exam was performed according to the departmental dose-optimization program which includes automated exposure control, adjustment of the mA and/or kV according to patient size and/or use of iterative reconstruction technique. CONTRAST:  OMNIPAQUE  IOHEXOL  350 MG/ML SOLN COMPARISON:  CT renal stone protocol from 05/16/2024. FINDINGS: Lower chest: The lung bases are clear. No pleural effusion. The heart is normal in size. No pericardial effusion. Hepatobiliary: The liver is normal in size. Non-cirrhotic configuration. No suspicious mass. There are multiple scattered simple cysts throughout the liver with largest in the segment 8 measuring up to 1.7 x 2.1 cm. No intrahepatic or extrahepatic bile duct dilation. No calcified gallstones. Normal gallbladder wall thickness. No pericholecystic inflammatory changes. Pancreas: Unremarkable. No pancreatic ductal dilatation or surrounding inflammatory changes. Spleen: Within normal limits. No focal lesion. Adrenals/Urinary Tract: Adrenal glands are unremarkable. Non-contrast images: There is a 2 mm nonobstructing calculus in the  left kidney interpolar region. No other radiopaque urinary tract calculi. Kidneys: There is moderate atrophy of the left kidney which exhibit innumerable cysts of varying sizes. Largest cyst is in the left kidney lower pole measuring up to 3.3 x 3.4 cm, favored to represent proteinaceous/hemorrhagic cysts. There is mild dilation of the left renal pelvis however, left ureter is unremarkable concerning mild component of underlying UPJ obstruction. No significant interval change. Left renal pelvis exhibit hyperattenuating contents, favoring blood products. Redemonstration of moderate to severe right hydronephrosis and markedly dilated right renal pelvis containing nonenhancing hyperattenuating areas favoring blood products. Right upper ureteric is also moderately dilated and contains hyperattenuating contents, favoring blood products. However, there is gradual tapering in the right mid ureter inferior to which, right ureter is nondilated. Redemonstration of moderate right perinephric fat stranding without interval change. Urinary Tract Opacification: None, limiting the evaluation for intraluminal masses. Urinary Bladder: Minimally distended precluding optimal assessment. However, there are dependent hyperattenuating nonenhancing areas which may represent blood products. No perivesical fat stranding. No bladder calculi. Stomach/Bowel: There is a 9 x 11 mm stable, submucosal lipoma in the fundus of the stomach. No disproportionate dilation of the small or large bowel loops. No evidence of abnormal bowel wall thickening or inflammatory changes. The appendix is unremarkable. There are scattered diverticula mainly in the sigmoid colon, without imaging signs of diverticulitis. Vascular/Lymphatic: No ascites or pneumoperitoneum. No abdominal or pelvic lymphadenopathy, by size criteria. No aneurysmal dilation of the major abdominal arteries. There are mild peripheral atherosclerotic vascular calcifications of the aorta and its  major branches. Reproductive: Normal size prostate. Symmetric seminal vesicles. Other: There are small fat containing umbilical and right inguinal hernias. The soft tissues and abdominal wall are otherwise unremarkable. Musculoskeletal: No suspicious osseous lesions. Diffuse increased density of the imaged bones, concerning for underlying renal osteodystrophy. There are mild multilevel degenerative changes in the visualized spine. IMPRESSION: 1. There is moderate right hydronephrosis and markedly dilated right renal pelvis. There is gradual tapering in the right mid ureter inferior to which, right ureter is nondilated. There are hyperattenuating contents in the right renal pelvis, right upper ureter, left renal pelvis and urinary bladder, favoring blood products. No discrete mass seen. However, evaluation is limited due to underdistention and lack of opacification of the urinary bladder. 2. There is moderate atrophy of the left kidney which exhibit innumerable cysts of varying sizes. There is mild dilation of the left renal pelvis however, left ureter is unremarkable, concerning for mild underlying UPJ obstruction. No significant  interval change. 3. Multiple other nonacute observations, as described above. Aortic Atherosclerosis (ICD10-I70.0). Electronically Signed   By: Ree Molt M.D.   On: 05/18/2024 12:06   CT RENAL STONE STUDY Result Date: 05/16/2024 CLINICAL DATA:  Abdominal and flank pain. Hematuria. History of prostate and bladder carcinoma. * Tracking Code: BO * EXAM: CT ABDOMEN AND PELVIS WITHOUT CONTRAST TECHNIQUE: Multidetector CT imaging of the abdomen and pelvis was performed following the standard protocol without IV contrast. RADIATION DOSE REDUCTION: This exam was performed according to the departmental dose-optimization program which includes automated exposure control, adjustment of the mA and/or kV according to patient size and/or use of iterative reconstruction technique. COMPARISON:   05/13/2024 FINDINGS: Lower chest: Stable elevated right hemidiaphragm with left basilar atelectasis versus scarring. Hepatobiliary: Stable small hepatic cysts. No mass visualized on this unenhanced exam. Gallbladder is unremarkable. No evidence of biliary ductal dilatation. Pancreas: No mass or inflammatory process visualized on this unenhanced exam. Spleen:  Within normal limits in size. Adrenals/Urinary tract: Bilateral renal parenchymal atrophy and numerous tiny cystic lesions again noted. Acute right renal swelling and perinephric stranding is seen with increased moderate to severe hydroureteronephrosis to the level of bladder. No ureteral calculi identified. High attenuation material in the dilated renal pelvis is suspicious for blood clot. Urinary bladder is empty and not well evaluated. Some high attenuation material in the central bladder lumen could be due to blood clot or bladder mass. Left extrarenal pelvis is stable and also contains high attenuation material suspicious for blood clot. Stomach/Bowel: No evidence of obstruction, inflammatory process, or abnormal fluid collections. Normal appendix visualized. Mild sigmoid diverticulosis, without signs of diverticulitis. Vascular/Lymphatic: No pathologically enlarged lymph nodes identified. No evidence of abdominal aortic aneurysm. Reproductive:  No mass or other significant abnormality. Other:  None. Musculoskeletal:  No suspicious bone lesions identified. IMPRESSION: Increased moderate to severe right hydroureteronephrosis to the level of the urinary bladder. No ureteral calculi identified. Increased right renal swelling and perinephric stranding, which may be due to obstructive uropathy or pyelonephritis. Nearly empty bladder is poorly evaluated, but high attenuation material in the central bladder lumen could be due to blood clot or bladder mass. Consider further evaluation with CT urogram without and with contrast. High attenuation material in the  dilated right renal pelvis and left extrarenal pelvis, likely due to blood clot. Stable bilateral renal parenchymal atrophy and numerous tiny cystic lesions. Mild sigmoid diverticulosis, without radiographic evidence of diverticulitis. Electronically Signed   By: Norleen DELENA Kil M.D.   On: 05/16/2024 11:47   CT Angio Abd/Pel W and/or Wo Contrast Result Date: 05/13/2024 EXAM: CTA ABDOMEN AND PELVIS WITHOUT AND WITH CONTRAST 05/13/2024 10:38:48 PM TECHNIQUE: CTA images of the abdomen and pelvis without and with intravenous contrast (100 mL iohexol  350 mg/mL injection). Three-dimensional MIP/volume rendered formations were performed. Automated exposure control, iterative reconstruction, and/or weight based adjustment of the mA/kV was utilized to reduce the radiation dose to as low as reasonably achievable. COMPARISON: CT abdomen and pelvis dated 05/13/2024. CLINICAL HISTORY: Hematuria. FINDINGS: VASCULATURE: AORTA: Aortic atherosclerotic calcification. No acute finding. No abdominal aortic aneurysm. No dissection. CELIAC TRUNK: No acute finding. No occlusion or significant stenosis. SUPERIOR MESENTERIC ARTERY: No acute finding. No occlusion or significant stenosis. RENAL ARTERIES: No acute finding. No occlusion or significant stenosis. ILIAC ARTERIES: No acute finding. No occlusion or significant stenosis. LIVER: Low density lesions in the liver, likely benign cysts. GALLBLADDER AND BILE DUCTS: Gallbladder is unremarkable. No biliary ductal dilatation. SPLEEN: The spleen is unremarkable. PANCREAS: The  pancreas is unremarkable. ADRENAL GLANDS: Bilateral adrenal glands demonstrate no acute abnormality. KIDNEYS, URETERS AND BLADDER: Asymmetric right greater than left perinephric stranding. Bilateral cortical renal atrophy. Multicystic kidneys. Parenchymal calcification versus nonobstructing stones in both kidneys. Severe right and moderate left hydronephrosis. Marked dilation of the right ureter. Hyperdensity in the both  renal pelvises does not substantially change between phases. Hyperdensity in the right ureter also does not change between phases. This may represent sediment or clotted blood. No significant excretion of contrast from CT abdomen and pelvis early or today at 08:40 pm compatible with poor renal function. Irregular thick-walled bladder. The bladder is nondistended. Trace perivesical stranding. GI AND BOWEL: Stomach and duodenal sweep demonstrate no acute abnormality. There is no bowel obstruction. No abnormal bowel wall thickening or distension. REPRODUCTIVE: Reproductive organs are unremarkable. PERITONEUM AND RETRPERITONEUM: No ascites or free air. LUNG BASE: No acute abnormality. LYMPH NODES: No lymphadenopathy. BONES AND SOFT TISSUES: Diffuse sclerosis in the vertebral bodies is compatible with renal osteodystrophy. No acute soft tissue abnormality. IMPRESSION: 1. Severe right and moderate left hydronephrosis with marked dilation of the right ureter. Hyperdensity in the both renal pelvises and right ureter unchanged between phases, possibly representing sediment or blood. Urothelial malignancy could appear similarly. Neurology consult recommended. 2. No significant excretion of contrast, compatible with poor renal function. 3. Irregular thick-walled bladder with trace perivesical stranding, which may reflect cystitis or nondistention. Electronically signed by: Norman Gatlin MD 05/13/2024 10:52 PM EDT RP Workstation: [id number]   CT ABDOMEN PELVIS W CONTRAST Result Date: 05/13/2024 CLINICAL DATA:  Hematuria, prior perinephric hematoma EXAM: CT ABDOMEN AND PELVIS WITH CONTRAST TECHNIQUE: Multidetector CT imaging of the abdomen and pelvis was performed using the standard protocol following bolus administration of intravenous contrast. RADIATION DOSE REDUCTION: This exam was performed according to the departmental dose-optimization program which includes automated exposure control, adjustment of the mA and/or kV  according to patient size and/or use of iterative reconstruction technique. CONTRAST:  OMNIPAQUE  IOHEXOL  350 MG/ML SOLN COMPARISON:  04/08/2024 FINDINGS: Lower chest: Chronic elevation of the left hemidiaphragm. Stable left lower lobe atelectasis. Trace pericardial effusion unchanged. Hepatobiliary: Stable hepatic hypodensities compatible with cysts. Gallbladder is unremarkable. No biliary duct dilation. Pancreas: Unremarkable. No pancreatic ductal dilatation or surrounding inflammatory changes. Spleen: Normal in size without focal abnormality. Adrenals/Urinary Tract: Stable appearance of the adrenal glands. Stable bilateral renal atrophy and cortical thinning. Numerous bilateral renal hypodensities are compatible with cysts. No specific follow-up recommended. There is stable moderate right-sided hydroureteronephrosis. There is high density material identified within the bilateral renal pelves, as well as within the mid right ureter, which may reflect sediment or blood products. Bilateral uroepithelial lesions are felt to be far less likely. Correlation with urinalysis is recommended. The bladder is decompressed, with nonspecific bladder wall thickening and perivesicular fat stranding. The appearance is stable since prior study. Stomach/Bowel: No bowel obstruction or ileus. Normal appendix right lower quadrant. Stable 1.2 cm lipoma within the gastric fundus and 2.2 cm lipoma within the sigmoid colon. No bowel wall thickening or inflammatory change. Vascular/Lymphatic: Aortic atherosclerosis. No enlarged abdominal or pelvic lymph nodes. Reproductive: Prostate is unremarkable. Other: No free fluid or free intraperitoneal gas. No abdominal wall hernia. Musculoskeletal: No acute or destructive bony abnormalities. Diffuse increased density throughout the visualized bony structures compatible with renal osteodystrophy. Reconstructed images demonstrate no additional findings. IMPRESSION: 1. Severe bilateral  hydroureteronephrosis, unchanged since prior exam. 2. High density material seen dependently within the bilateral renal pelves and mid right ureter, likely sediment  or blood products. Correlation with urinalysis recommended. 3. Chronic bladder wall thickening and perivesicular fat stranding, nonspecific given decompressed state of the bladder. Underlying cystitis cannot be excluded. 4.  Aortic Atherosclerosis (ICD10-I70.0). Electronically Signed   By: Ozell Daring M.D.   On: 05/13/2024 21:02   DG Chest 1 View Result Date: 05/13/2024 EXAM: 1 VIEW(S) XRAY OF THE CHEST 05/13/2024 07:14:46 PM COMPARISON: 04/08/2024 CLINICAL HISTORY: Chest pain [id number]. Per chart - Pt BIB [facility] from home. Pt endorses blood from penis x1day. Passing large long clots are passing. On monitor 160-170 SVT, initially 180s. Refused adenosine  en route due to recently taking metoprolol  1 hour ago. On dialysis and doesn't make ; urine until yesterday when he started feeling urge to urinate. FINDINGS: LUNGS AND PLEURA: Left basilar atelectasis or pneumonia. This is similar to prior. Elevated left hemidiaphragm. No pulmonary edema. No pleural effusion. No pneumothorax. HEART AND MEDIASTINUM: Cardiomegaly. BONES AND SOFT TISSUES: No acute osseous abnormality. IMPRESSION: 1. Left basilar atelectasis or pneumonia, similar to prior. Electronically signed by: Norman Gatlin MD 05/13/2024 07:21 PM EDT RP Workstation: [id number]     The results of significant diagnostics from this hospitalization (including imaging, microbiology, ancillary and laboratory) are listed below for reference.     Microbiology: Recent Results (from the past 240 hours)  MRSA Next Gen by PCR, Nasal     Status: Abnormal   Collection Time: 05/24/24 11:43 AM   Specimen: Nasal Mucosa; Nasal Swab  Result Value Ref Range Status   MRSA by PCR Next Gen DETECTED (A) NOT DETECTED Final    Comment: RESULT CALLED TO, READ BACK BY AND VERIFIED WITH: RN LATOYA THOMPSON ON  05/24/24 @ 2016 BY DRT (NOTE) The GeneXpert MRSA Assay (FDA approved for NASAL specimens only), is one component of a comprehensive MRSA colonization surveillance program. It is not intended to diagnose MRSA infection nor to guide or monitor treatment for MRSA infections. Test performance is not FDA approved in patients less than 19 years old. Performed at [facility], [address] [place], [place] [address]      Labs: BNP (last 3 results) Recent Labs    05/13/24 1849  BNP 62.5   Basic Metabolic Panel: Recent Labs  Lab 05/21/24 0729 05/24/24 0256 05/25/24 0314  NA 132* 135 136  K 4.0 4.0 5.0  CL 93* 94* 95*  CO2 23 29 27   GLUCOSE 94 128* 77  BUN 49* 22* 38*  CREATININE 11.71* 6.60* 8.92*  CALCIUM  8.6* 9.4 9.3  MG  --  1.9 2.0  PHOS 5.8*  --   --    Liver Function Tests: Recent Labs  Lab 05/21/24 0729  ALBUMIN  2.7*   No results for input(s): LIPASE, AMYLASE in the last 168 hours. No results for input(s): AMMONIA in the last 168 hours. CBC: Recent Labs  Lab 05/21/24 0729 05/22/24 1031 05/23/24 0325 05/24/24 0256 05/25/24 0314  WBC 5.5  --   --  5.1 5.5  HGB 7.4* 7.8* 7.8* 8.3* 7.9*  HCT 22.5* 23.5* 23.8* 25.7* 25.0*  MCV 93.4  --   --  94.8 95.1  PLT 145*  --   --  235 239   Cardiac Enzymes: No results for input(s): CKTOTAL, CKMB, CKMBINDEX, TROPONINI in the last 168 hours. BNP: Invalid input(s): POCBNP CBG: No results for input(s): GLUCAP in the last 168 hours. D-Dimer No results for input(s): DDIMER in the last 72 hours. Hgb A1c No results for input(s): HGBA1C in the last 72 hours. Lipid  Profile No results for input(s): CHOL, HDL, LDLCALC, TRIG, CHOLHDL, LDLDIRECT in the last 72 hours. Thyroid  function studies No results for input(s): TSH, T4TOTAL, T3FREE, THYROIDAB in the last 72 hours.  Invalid input(s): FREET3 Anemia work up No results for input(s): VITAMINB12, FOLATE, FERRITIN,  TIBC, IRON, RETICCTPCT in the last 72 hours. Urinalysis    Component Value Date/Time   COLORURINE RED (A) 01/16/2024 2249   APPEARANCEUR TURBID (A) 01/16/2024 2249   LABSPEC  01/16/2024 2249    TEST NOT REPORTED DUE TO COLOR INTERFERENCE OF URINE PIGMENT   PHURINE  01/16/2024 2249    TEST NOT REPORTED DUE TO COLOR INTERFERENCE OF URINE PIGMENT   GLUCOSEU (A) 01/16/2024 2249    TEST NOT REPORTED DUE TO COLOR INTERFERENCE OF URINE PIGMENT   HGBUR (A) 01/16/2024 2249    TEST NOT REPORTED DUE TO COLOR INTERFERENCE OF URINE PIGMENT   BILIRUBINUR (A) 01/16/2024 2249    TEST NOT REPORTED DUE TO COLOR INTERFERENCE OF URINE PIGMENT   KETONESUR (A) 01/16/2024 2249    TEST NOT REPORTED DUE TO COLOR INTERFERENCE OF URINE PIGMENT   PROTEINUR (A) 01/16/2024 2249    TEST NOT REPORTED DUE TO COLOR INTERFERENCE OF URINE PIGMENT   UROBILINOGEN 0.2 04/13/2013 0743   NITRITE (A) 01/16/2024 2249    TEST NOT REPORTED DUE TO COLOR INTERFERENCE OF URINE PIGMENT   LEUKOCYTESUR (A) 01/16/2024 2249    TEST NOT REPORTED DUE TO COLOR INTERFERENCE OF URINE PIGMENT   Sepsis Labs Recent Labs  Lab 05/21/24 0729 05/24/24 0256 05/25/24 0314  WBC 5.5 5.1 5.5   Microbiology Recent Results (from the past 240 hours)  MRSA Next Gen by PCR, Nasal     Status: Abnormal   Collection Time: 05/24/24 11:43 AM   Specimen: Nasal Mucosa; Nasal Swab  Result Value Ref Range Status   MRSA by PCR Next Gen DETECTED (A) NOT DETECTED Final    Comment: RESULT CALLED TO, READ BACK BY AND VERIFIED WITH: RN LATOYA THOMPSON ON 05/24/24 @ 2016 BY DRT (NOTE) The GeneXpert MRSA Assay (FDA approved for NASAL specimens only), is one component of a comprehensive MRSA colonization surveillance program. It is not intended to diagnose MRSA infection nor to guide or monitor treatment for MRSA infections. Test performance is not FDA approved in patients less than 50 years old. Performed at [facility], [address]  [place], [place] [address]      Time coordinating discharge:  I have spent 35 minutes face to face with the patient and on the ward discussing the patients care, assessment, plan and disposition with other care givers. >50% of the time was devoted counseling the patient about the risks and benefits of treatment/Discharge disposition and coordinating care.   SIGNED:   Burgess JAYSON Dare, MD  [facility] 05/26/2024, 8:11 AM   If 7PM-7AM, please contact night-coverage

## 2024-05-28 NOTE — Progress Notes (Signed)
 Late note entry 10/13 1019 am D/c over weekend noted. Contacted out-pt [facility] to inform of d/c and to anticipate back at clinic today. No further support needed.   Lavanda Handy [facility] D[facility] Navigator [phone]

## 2024-06-01 ENCOUNTER — Telehealth: Payer: Self-pay

## 2024-06-01 NOTE — Telephone Encounter (Signed)
 See telephone encounter.

## 2024-06-07 NOTE — Telephone Encounter (Signed)
 Reviewed urology clearance request note.  Avelina DELENA Sharps, RN - 05/24/2024 1:50 PM EDT Formatting of this note might be different from the original. ----- Message from Sherran Moats, NP sent at 05/23/2024 11:55 AM EDT ----- Regarding: RE: GI asking for clearance for colonoscopy We have no restrictions from a urology standpoint  Thank you, Sherran Moats NP-C

## 2024-06-21 ENCOUNTER — Other Ambulatory Visit: Payer: Self-pay

## 2024-06-21 ENCOUNTER — Telehealth: Payer: Self-pay | Admitting: Gastroenterology

## 2024-06-21 DIAGNOSIS — K625 Hemorrhage of anus and rectum: Secondary | ICD-10-CM

## 2024-06-21 DIAGNOSIS — Z1211 Encounter for screening for malignant neoplasm of colon: Secondary | ICD-10-CM

## 2024-06-21 DIAGNOSIS — N186 End stage renal disease: Secondary | ICD-10-CM

## 2024-06-21 NOTE — Telephone Encounter (Signed)
 Spoke with patient today (separate phone note) & he was calling to follow up on colonoscopy. States he is still noticing bright red bleeding with bowel movements (small amount, unchanged since last OV). BM's occur once every 2-3 days. He's been using anusol  suppositories & cream for hemorrhoids, but believes this is an internal problem. He's currently at dialysis (MWF) & states hgb is 8.6. He was scheduled for a CYSTOSCOPY TRANSURETHRAL RESECTION BLADDER TUMOR today, but cancelled & urology advised him to follow up with us  first. We already received clearance from urology, however looks like oncology was needed as well. Pt states he doesn't have one & that everything is through urology only with Dr. Vannie & Dr. Nicholaus. We discussed ED precautions in the meantime & advised him we'd be in touch with scheduling info soon.

## 2024-06-21 NOTE — Telephone Encounter (Signed)
 See phone note 05/08/24

## 2024-06-21 NOTE — Telephone Encounter (Signed)
 Inbound call from patient stating he has been having a significant amount of blood in his stool. Patient is scheduled for 12/2. Patient is requesting a call to be advised if he should go to the emergency room. Please advise, thank you

## 2024-06-21 NOTE — Telephone Encounter (Signed)
 Spoke with patient regarding PA recommendations. He has been scheduled for colon at [facility] on 12/2 at 1:10 PM with Dr. Suzann in Dr. Lafonda absence. Amb ref & hospital orders placed, instructions sent via mychart. No blood thinners/diabetic meds. OV with Deanna also rescheduled for later date 12/30 at 3:00 pm. Pt is unable to come in for labs soon d/t limited transportation. Hgb drawn today, should result by tomorrow per nurse Orie with [facility] [phone]). Will call back tomorrow for results to be faxed to our office for provider review. Pt is aware & has no further questions.

## 2024-06-22 NOTE — Telephone Encounter (Signed)
 Spoke with nurse Leita with dialysis center & updated hgb from 11/6 is 9.8. She is going to fax over a hard copy for provider to review as well.

## 2024-06-22 NOTE — Telephone Encounter (Signed)
 Pt made aware. Will call HD center again closer to procedure time to get updated hgb.

## 2024-07-10 ENCOUNTER — Encounter (HOSPITAL_COMMUNITY): Payer: Self-pay | Admitting: Pediatrics

## 2024-07-10 NOTE — Telephone Encounter (Signed)
 Patient did contact [facility], however he unable to afford the transportation fees. He has one other person that may be able to take him to his appointment, and will let us  know by tomorrow evening. Advised him I would have Elspeth, RN reach out to him.

## 2024-07-10 NOTE — Telephone Encounter (Signed)
 Spoke with patient & he is unsure if he can make it to next weeks procedure d/t a conflict with transportation. [facility] number provided & advised pt to contact them for transportation option. I will call him back this afternoon to confirm procedure date once he has spoken with [facility] & if need be will reschedule to 1/12 with Dr. Federico. He's also been made aware about the prep & will reach out to his kidney doctor.

## 2024-07-11 ENCOUNTER — Telehealth: Payer: Self-pay

## 2024-07-11 NOTE — Telephone Encounter (Signed)
 Procedure:COLON Procedure date: 07/17/24 Procedure location: [facility] Arrival Time: 2:00 Spoke with the patient Y/N: N Any prep concerns? N  Has the patient obtained the prep from the pharmacy ? N Do you have a care partner and transportation: N Any additional concerns? N  I called patient 3 different times and got voice mail. I left a detailed message and the office number in case the patient have questions are concerns

## 2024-07-11 NOTE — Telephone Encounter (Signed)
 Spoke with pt. Pt stated that he has no one to take him to the his procedure on 07/17/2024. Pt stated that [facility] is not an option as they want 100 dollars to take him and 50 cent a mile.  Pt requested that we cancel the procedure and reschedule him at a later date when he will have family in town to take him and stay with him.  Pt was notified that I would call him back Monday morning and get him rescheduled.  Scheduling contacted and procedure was canceled for 07/17/2024 Pt made aware.

## 2024-07-11 NOTE — Telephone Encounter (Signed)
 Left message for pt to call back

## 2024-07-11 NOTE — Telephone Encounter (Signed)
 Spoke with pt. Pt stated that he has no one to take him to the his procedure on 07/17/2024. Pt stated that [facility] is not an option as they want 100 dollars to take him and 50 cent a mile.  Pt requested that we cancel the procedure and reschedule him at a later date when he will have family in town to take him and stay with him.  Pt was notified that I would call him back Monday morning and get him rescheduled.  Scheduling contacted and procedure was canceled for 07/17/2024 Pt made aware.  Pt verbalized understanding with all questions answered.   Routed as FYI

## 2024-07-14 DIAGNOSIS — I959 Hypotension, unspecified: Secondary | ICD-10-CM | POA: Insufficient documentation

## 2024-07-14 NOTE — Progress Notes (Deleted)
 Cardiology Office Note:   Date:  07/14/2024  ID:  ELLINGTON GREENSLADE, DOB 31-Jul-1966, MRN [id number] PCP: [facility]  [facility] Providers Cardiologist:  None {  History of Present Illness:   Devin Bullock is a 58 y.o. male  with the above past medical history including chronic systolic heart failure, dilated cardiomyopathy, SVT, AVNRT, GI bleed, anemia, prostate cancer, bladder cancer, ESRD on HD, and obesity.  In the office in 2015 demonstrated Lexiscan  Myoview at the time was intermediate risk with reversible apical defect consistent with probable apical thinning.  He was hospitalized in October 2024 in the setting of SVT.  He had an elevated heart rate during dialysis with associated shortness of breath.  He was given adenosine  and IV metoprolol  with improvement in his heart rate. Cardiology was consulted.  He was started on metoprolol  tartrate 12.5 mg twice daily.  Echocardiogram showed normal EF (prior EF 45-50%). TSH was normal.  Troponin was elevated, likely secondary to demand ischemia in the setting of tachycardia. He was not felt to be a candidate for invasive angiography. It was noted that outpatient coronary CTA could be considered. It was noted that should he have recurrent arrhythmia, EP evaluation could be considered as an outpatient. Additionally, it was noted that should he have difficulty with hypotension on metoprolol , amiodarone  could be considered. CT of the chest on admission showed goiter with multiple thyroid  nodules. Outpatient thyroid  ultrasound was recommended. He has had recent issues with hematuria, GI bleeding, and anemia. He is undergoing evaluation by GI and urology.  In this setting, he was not felt to be a candidate for aspirin .   He presents today for follow-up.  ***     ***   Since his hospitalization he has done well from a cardiac standpoint.  He denies any chest pain, palpitations, dyspnea, edema, PND, orthopnea, weight gain.  Overall,  he reports feeling well.  ROS: ***  Studies Reviewed:    EKG:       ***  Risk Assessment/Calculations:   {Does this patient have ATRIAL FIBRILLATION?[phone]} No BP recorded.  {Refresh Note OR Click here to enter BP  :1}***        Physical Exam:   VS:  There were no vitals taken for this visit.   Wt Readings from Last 3 Encounters:  05/25/24 299 lb 9.7 oz (135.9 kg)  05/01/24 (!) 303 lb (137.4 kg)  03/15/24 300 lb (136.1 kg)     GEN: Well nourished, well developed in no acute distress NECK: No JVD; No carotid bruits CARDIAC: ***RR, *** murmurs, rubs, gallops RESPIRATORY:  Clear to auscultation without rales, wheezing or rhonchi  ABDOMEN: Soft, non-tender, non-distended EXTREMITIES:  No edema; No deformity   ASSESSMENT AND PLAN:   SVT/AVNRT: ***  Occurred during dialysis, improved with adenosine  and metoprolol .  Echo, labs were unremarkable.  He is tolerating low-dose metoprolol , denies any palpitations.  Continue metoprolol .   Hypotension: BP ***  remains stable on low-dose metoprolol .  Continue to monitor.   Chronic systolic heart failure with improved EF: Prior EF 45 to 50%.  ***  , most recent echo showed normal EF. Euvolemic and well compensated on exam.  Fluid volume management per HD.   Elevated troponin:  ***   Likely occurred in the setting of demand ischemia in the setting of tachycardia.  Outpatient ischemic evaluation was mentioned.  Patient is asymptomatic.  Discussed with Dr. Lavona, no indication for ischemic evaluation at this time.  Should he develop symptoms, could consider cardiac PET stress test.   Anemia/GI bleed/hematuria: ***  Following with GI/urology.  Pending colonoscopy as below.   ESRD on HD: ***  Following with nephrology.       Follow up ***  Signed, Lynwood Schilling, MD

## 2024-07-16 ENCOUNTER — Other Ambulatory Visit: Payer: Self-pay

## 2024-07-16 DIAGNOSIS — K625 Hemorrhage of anus and rectum: Secondary | ICD-10-CM

## 2024-07-16 DIAGNOSIS — K648 Other hemorrhoids: Secondary | ICD-10-CM

## 2024-07-16 DIAGNOSIS — N186 End stage renal disease: Secondary | ICD-10-CM

## 2024-07-16 DIAGNOSIS — D649 Anemia, unspecified: Secondary | ICD-10-CM

## 2024-07-16 DIAGNOSIS — Z1211 Encounter for screening for malignant neoplasm of colon: Secondary | ICD-10-CM

## 2024-07-16 NOTE — Telephone Encounter (Signed)
 Pt rescheduled for Colonoscopy for 08/21/2024 at 10:17 with Dr. Suzann at [facility]. Pt to arrive at 8:45 AM Pt made aware.  Ambulatory referral was placed in Epic.  Prep instructions were updated and sent to pt via my chart. Pt made aware.  Pt verbalized understanding with all questions answered.

## 2024-07-16 NOTE — Telephone Encounter (Signed)
 Chart reviewed and noted that pt procedure has been rescheduled due to transportation issues.

## 2024-07-16 NOTE — Telephone Encounter (Signed)
 Reminder.  Pt rescheduled for Colonoscopy for 08/21/2024 at 10:17 with Dr. Suzann at [facility] Pt to arrive at 8:45 AM Pt made aware.   Please see notes below.

## 2024-07-17 ENCOUNTER — Ambulatory Visit: Admitting: Gastroenterology

## 2024-07-17 ENCOUNTER — Ambulatory Visit: Attending: Cardiology | Admitting: Cardiology

## 2024-07-17 ENCOUNTER — Ambulatory Visit (HOSPITAL_COMMUNITY): Admission: RE | Admit: 2024-07-17 | Source: Home / Self Care | Admitting: Pediatrics

## 2024-07-17 DIAGNOSIS — I471 Supraventricular tachycardia, unspecified: Secondary | ICD-10-CM

## 2024-07-17 DIAGNOSIS — I420 Dilated cardiomyopathy: Secondary | ICD-10-CM

## 2024-07-17 DIAGNOSIS — I959 Hypotension, unspecified: Secondary | ICD-10-CM

## 2024-08-07 NOTE — Progress Notes (Addendum)
 Anesthesia Review:  PCP: Cardiologist : Damien Daneen Piety LOV 07/08/23   PPM/ ICD: Device Orders: Rep Notified:  Chest x-ray : 05/03/24- 1 view  EKG : 05/16/2024  Echo : 05/22/2023  Stress test: [id number]  Cardiac Cath :   Activity level:  Sleep Study/ CPAP : Fasting Blood Sugar :      / Checks Blood Sugar -- times a day:    Blood Thinner/ Instructions /Last Dose: ASA / Instructions/ Last Dose :    Called pt to review med hx and go over instructoins on 08/07/24.  Pt was at dialysis.  Will call pt back on 08/08/2024 after 12 noon.     Dialysis pt -    Latest labs in epic 05/25/2024  Called pt on 08/08/24 to complete hx and instructons for procedure for 08/21/24.   Message sent via epic to DR McGreal letting her be aware that pt does not have bowel prep instructons for 08/21/24.  And pt has been instructed to contact office for those instructions.     Dialysis  Access on left side per pt  Hx of MRSA

## 2024-08-08 ENCOUNTER — Other Ambulatory Visit: Payer: Self-pay

## 2024-08-08 ENCOUNTER — Encounter (HOSPITAL_COMMUNITY): Payer: Self-pay | Admitting: Pediatrics

## 2024-08-08 NOTE — Progress Notes (Signed)
 Spoke with pt on 08/08/24.  Pt states he does not have bowel prep instructions for procedure on 08/22/2023.  Pt was instructed to call office for bowel prep instructions.  Just sending you a message to let you know. Thank You.

## 2024-08-13 ENCOUNTER — Other Ambulatory Visit: Payer: Self-pay | Admitting: Gastroenterology

## 2024-08-13 DIAGNOSIS — K648 Other hemorrhoids: Secondary | ICD-10-CM

## 2024-08-13 NOTE — Telephone Encounter (Signed)
 Reminder was received in Epic. Pt was made aware of recommendations to have CBC drawn before Procedure. Pt stated that he is to have dialysis Tuesday and Friday this week due to holiday schedule.  Pt was notified to have a CBC drawn at dialysis this Friday per Dr. Suzann recommendations.  Pt has office visit tomorrow. Pt was notified to go over prep instructions at the time of office visit, Previously sent to him.  Pt verbalized understanding with all questions answered.

## 2024-08-13 NOTE — Telephone Encounter (Signed)
 [facility] [phone] was contacted and spoke with Nurse Vickie: Boby was notified of the recommendation for the CBC to be drawn this Friday due to pt upcoming Colonoscopy. Fax number provided.  Will follow up on Monday.

## 2024-08-14 ENCOUNTER — Ambulatory Visit: Admitting: Gastroenterology

## 2024-08-14 ENCOUNTER — Telehealth: Payer: Self-pay

## 2024-08-14 ENCOUNTER — Encounter: Payer: Self-pay | Admitting: Gastroenterology

## 2024-08-14 VITALS — BP 100/70 | HR 81 | Wt 297.8 lb

## 2024-08-14 DIAGNOSIS — D649 Anemia, unspecified: Secondary | ICD-10-CM | POA: Diagnosis not present

## 2024-08-14 DIAGNOSIS — C678 Malignant neoplasm of overlapping sites of bladder: Secondary | ICD-10-CM | POA: Diagnosis not present

## 2024-08-14 DIAGNOSIS — R194 Change in bowel habit: Secondary | ICD-10-CM

## 2024-08-14 DIAGNOSIS — N186 End stage renal disease: Secondary | ICD-10-CM

## 2024-08-14 DIAGNOSIS — K648 Other hemorrhoids: Secondary | ICD-10-CM | POA: Diagnosis not present

## 2024-08-14 DIAGNOSIS — Z8546 Personal history of malignant neoplasm of prostate: Secondary | ICD-10-CM | POA: Diagnosis not present

## 2024-08-14 DIAGNOSIS — Z992 Dependence on renal dialysis: Secondary | ICD-10-CM | POA: Diagnosis not present

## 2024-08-14 DIAGNOSIS — Z1211 Encounter for screening for malignant neoplasm of colon: Secondary | ICD-10-CM

## 2024-08-14 DIAGNOSIS — K625 Hemorrhage of anus and rectum: Secondary | ICD-10-CM | POA: Diagnosis not present

## 2024-08-14 NOTE — Progress Notes (Signed)
 "  Chief Complaint: follow-up, rectal bleeding Primary GI Doctor:Dr. Federico  HPI:  Patient is a  58  year old male patient with past medical history of ESRD on HD,HFrEF, bladder CA on chemotherapy, history of prostate CA, who presents for follow-up on rectal bleeding and anemia.  01/16/24 patient presented from hemodialysis center for evaluation of hemoglobin of 5.1.  He was found to have right perirenal retroperitoneal hematoma with blood clots in right renal pelvis/proximal ureter.  Patient was transfused with 5 units of packed red blood cells.  Followed by urology.  GI was consulted for hematochezia. Denies bowel movements or rectal bleeding during this hospitalization.     At this juncture, it appears that his anemia and bleeding are related to perinephric hematoma. There does not appear to be an active GI source. He was initially scheduled for colonoscopy in 2 weeks. We discussed delaying his colonoscopy approximately 6 weeks due to his hematoma.   Patient was scheduled for colonoscopy with Dr. Federico on 8/14 however was canceled due to patient being on chemotherapy.  04/19/24 seen by urology. Most recently he has had high-grade T1 transitional cell carcinoma of the bladder. He has had intravesical gemcitabine/docetaxel previously with maintenance and developed recurrence. He was to get intravesical BCG but that has been on hold due to active passage of blood and clots. Patient states overall he has been doing well in the interim but does continue with active bleeding. Patient had cystoscopy prior to considering intravesical BCG. Plan is for Transurethral Resection of Bladder Tumor in near future.  Interval History Patient last seen in GI office on 05/01/24 by myself.  Patient presents for follow-up on rectal bleeding. Patient reports he has continued with rectal bleeding intermittently with most bowel movements. He reports most stools are formed with occasional diarrhea. No abdominal pain.  He  reports occasional stool leakage with clots. He has used topical Anusol  for hemorrhoids with no improvement in symptoms.   He has procedure with urology scheduled for 08/30/24 for Transurethral Resection of a Bladder Tumor (TURBT) with possible instillation of intravesical chemotherapy. he has noted blood come from his urethra and shows me pictures of large clots on his phone.  No prior colonoscopy, family history of colon cancer in 2 aunts   Wt Readings from Last 3 Encounters:  08/14/24 297 lb 12.8 oz (135.1 kg)  05/25/24 299 lb 9.7 oz (135.9 kg)  05/01/24 (!) 303 lb (137.4 kg)    Past Medical History:  Diagnosis Date   Acute on chronic combined systolic and diastolic CHF (congestive heart failure) (HCC) 04/13/2013   Anemia    AV fistula    Cancer (HCC)    prostate and bladder cancer - 2022 diagnosed   CHF (congestive heart failure) (HCC)    patient denies   CKD (chronic kidney disease) 07/03/2014   ESRD on hemodialysis (HCC)    [facility]   History of blood transfusion 03/2020   Hypertension    history of, no longer taking medication at this time   Morbid obesity (HCC)    Parathyroid disorder     Past Surgical History:  Procedure Laterality Date   AV FISTULA PLACEMENT Left 05/24/2014   Procedure: BRACHIOCEPHALIC ARTERIOVENOUS (AV) FISTULA CREATION;  Surgeon: Lonni GORMAN Blade, MD;  Location: [facility] OR;  Service: Vascular;  Laterality: Left;   AV FISTULA PLACEMENT Left 09/15/2023   Procedure: LEFT ARM ARTERIOVENOUS (AV) FISTULA REVISION AND PLICATION;  Surgeon: Gretta Lonni PARAS, MD;  Location: [facility] OR;  Service:  Vascular;  Laterality: Left;   ESOPHAGOGASTRODUODENOSCOPY (EGD) WITH PROPOFOL  N/A 04/03/2020   Procedure: ESOPHAGOGASTRODUODENOSCOPY (EGD) WITH PROPOFOL ;  Surgeon: Saintclair Jasper, MD;  Location: [facility] ENDOSCOPY;  Service: Gastroenterology;  Laterality: N/A;   PROSTATE BIOPSY N/A 11/27/2020   Procedure: BIOPSY TRANSRECTAL ULTRASONIC PROSTATE (TUBP);  Surgeon: Carolee Sherwood JONETTA DOUGLAS, MD;  Location: [facility] ORS;  Service: Urology;  Laterality: N/A;  REQUESTING 87 MINS FOR CASE   REVISON OF ARTERIOVENOUS FISTULA Left 01/08/2021   Procedure: REVISON PLICATION OF ARTERIOVENOUS FISTULA LEFT;  Surgeon: Sheree Penne Bruckner, MD;  Location: [facility] OR;  Service: Vascular;  Laterality: Left;   SURGICAL EXCISION OF EXCESSIVE SKIN Left 09/15/2023   Procedure: SURGICAL EXCISION OF EXCESSIVE SKIN, ULCERATIVE SKIN;  Surgeon: Gretta Bruckner PARAS, MD;  Location: [facility] OR;  Service: Vascular;  Laterality: Left;   THROMBECTOMY W/ EMBOLECTOMY Left 09/15/2023   Procedure: THROMBECTOMY ARTERIOVENOUS FISTULA;  Surgeon: Gretta Bruckner PARAS, MD;  Location: [facility] OR;  Service: Vascular;  Laterality: Left;   TRANSURETHRAL RESECTION OF BLADDER TUMOR N/A 11/27/2020   Procedure: TRANSURETHRAL RESECTION OF BLADDER TUMOR (TURBT);  Surgeon: Carolee Sherwood JONETTA DOUGLAS, MD;  Location: [facility] ORS;  Service: Urology;  Laterality: N/A;  NEED PARALYSIS    Current Outpatient Medications  Medication Sig Dispense Refill   atorvastatin  (LIPITOR) 40 MG tablet Take 1 tablet (40 mg total) by mouth daily. 30 tablet 2   B Complex-C-Folic Acid (RENA-VITE RX) 1 MG TABS Take 1 tablet by mouth daily.     cinacalcet  (SENSIPAR ) 60 MG tablet Take 60 mg by mouth daily.     guaifenesin  (ROBITUSSIN) 100 MG/5ML syrup Take 200 mg by mouth 3 (three) times daily as needed for cough.     Methoxy PEG-Epoetin  Beta (MIRCERA IJ) Mircera     metoprolol  tartrate (LOPRESSOR ) 25 MG tablet Take 12.5 mg by mouth 2 (two) times daily.     midodrine  (PROAMATINE ) 5 MG tablet Take 5 mg by mouth. Taking Monday, Wednesday and Friday at dialysis     sevelamer  carbonate (RENVELA ) 800 MG tablet Take 2,400 mg by mouth 3 (three) times daily with meals. Take 1 tablet by mouth a snack     hydrocortisone  (ANUSOL -HC) 2.5 % rectal cream Place 1 Application rectally 2 (two) times daily. (Patient not taking: Reported on 08/14/2024) 30 g 0   No current facility-administered  medications for this visit.    Allergies as of 08/14/2024   (No Known Allergies)    Family History  Problem Relation Age of Onset   Diabetes Mother    Hypertension Mother    Heart attack Mother 55   Stroke Father    Hypertension Father    Depression Father    Diabetes Brother    Hypertension Brother    Stroke Brother 7   Alcohol abuse Brother    Asthma Brother    Colon cancer Neg Hx    Esophageal cancer Neg Hx    Stomach cancer Neg Hx     Review of Systems:    Constitutional: No weight loss, fever, chills, weakness or fatigue HEENT: Eyes: No change in vision               Ears, Nose, Throat:  No change in hearing or congestion Skin: No rash or itching Cardiovascular: No chest pain, chest pressure or palpitations   Respiratory: No SOB or cough Gastrointestinal: See HPI and otherwise negative Genitourinary: No dysuria or change in urinary frequency Neurological: No headache, dizziness or syncope Musculoskeletal: No new muscle or joint  pain Hematologic: No bleeding or bruising Psychiatric: No history of depression or anxiety    Physical Exam:  Vital signs: BP 100/70 (BP Location: Right Arm, Patient Position: Sitting, Cuff Size: Large)   Pulse 81   Wt 297 lb 12.8 oz (135.1 kg)   BMI 36.25 kg/m   Constitutional:   Pleasant  A.A. male appears to be in NAD, Well developed, Well nourished, alert and cooperative Throat: Oral cavity and pharynx without inflammation, swelling or lesion.  Respiratory: Respirations even and unlabored. Lungs clear to auscultation bilaterally.   No wheezes, crackles, or rhonchi.  Cardiovascular: Normal S1, S2. Regular rate and rhythm. No peripheral edema, cyanosis or pallor.  Gastrointestinal:  Soft, nondistended, nontender. No rebound or guarding. Normal bowel sounds. No appreciable masses or hepatomegaly. Rectal: not preformed  Msk:  Symmetrical without gross deformities. Without edema, no deformity or joint abnormality.  Neurologic:  Alert  and  oriented x4;  grossly normal neurologically.  Skin:   Dry and intact without significant lesions or rashes.  RELEVANT LABS AND IMAGING: CBC    Latest Ref Rng & Units 05/25/2024    3:14 AM 05/24/2024    2:56 AM 05/23/2024    3:25 AM  CBC  WBC 4.0 - 10.5 K/uL 5.5  5.1    Hemoglobin 13.0 - 17.0 g/dL 7.9  8.3  7.8   Hematocrit 39.0 - 52.0 % 25.0  25.7  23.8   Platelets 150 - 400 K/uL 239  235       CMP     Latest Ref Rng & Units 05/25/2024    3:14 AM 05/24/2024    2:56 AM 05/21/2024    7:29 AM  CMP  Glucose 70 - 99 mg/dL 77  871  94   BUN 6 - 20 mg/dL 38  22  49   Creatinine 0.61 - 1.24 mg/dL 1.07  3.39  88.28   Sodium 135 - 145 mmol/L 136  135  132   Potassium 3.5 - 5.1 mmol/L 5.0  4.0  4.0   Chloride 98 - 111 mmol/L 95  94  93   CO2 22 - 32 mmol/L 27  29  23    Calcium  8.9 - 10.3 mg/dL 9.3  9.4  8.6      Lab Results  Component Value Date   TSH 0.911 05/21/2023   04/08/24 CTAP IMPRESSION: 1. Stable, marked severity bilateral hydronephrosis, right greater than left, with marked severity right-sided hydroureter. 2. Renal atrophy with numerous stable bilateral simple renal cysts. 3. Colonic diverticulosis. 4. Stable, moderate severity left basilar scarring and atelectasis. 5. Aortic atherosclerosis.  05/2023 echo- Left ventricular ejection fraction, by estimation, is 50 to 55%.   Assessment/Plan: Encounter Diagnoses  Name Primary?   Rectal bleeding Yes   Screening for colon cancer    Internal hemorrhoids    Altered bowel habits    ESRD on dialysis [facility])    History of prostate cancer    Malignant neoplasm of overlapping sites of bladder (HCC)    Anemia, unspecified type   58 year old gentleman with ESRD on HD with anemia, rectal bleeding, hematuria (patient is usually anuric)  and bladder CA scheduled for (TURBT) with possible instillation of intravesical chemotherapy on 1/15.   #1 chronic low-grade hematochezia #2 gross hematuria #3 history of prostate/bladder  cancer   scheduled for colonoscopy in June but delayed due to large right perinephric hematoma and then again in August, cancelled due to chemotherapy.  Patient has continued to have rectal bleeding and Hgb  currently 7.9. At his last appointment it was noted on physical exam he had internal hemorrhoids and treated patient with topical Anusol  which patient states did not resolve his symptoms.  Patient has never had colonoscopy will go ahead and proceed in [place] with colonoscopy with Dr. Suzann as scheduled on 1/6 to full evaluate.   #4  End-stage renal disease, HD on Monday Wednesday Friday  #5 Heart failure with preserved ejection fraction 55%   #6 Class II obesity   -CBC scheduled for this Friday at HD -colonoscopy scheduled for 1/6 at [facility] with Dr. Suzann. The risks and benefits of colonoscopy with possible polypectomy / biopsies were discussed and the patient agrees to proceed.  -consider hemorrhoid banding pending colonoscopy results   Thank you for the courtesy of this consult. Please call me with any questions or concerns.   Vertis Scheib, FNP-C [facility] 08/14/2024, 4:44 PM  Cc: [facility]  I have reviewed the clinic note as outlined by Cathryne Beal, NP and agree with the assessment, plan and medical decision making.  Mr. Alamo returns to the gastroenterology office for follow-up of rectal bleeding.  Symptoms did not resolve despite treatment with Anusol .  Previous colonoscopy was rescheduled due to a perinephric hematoma and chemotherapy.  Currently scheduled for hospital-based colonoscopy 08/21/2024 given ESRD.  Further recommendations pending that result.  Surgery and intravesicular chemotherapy scheduled for 08/30/2024.  Inocente Suzann, MD  "

## 2024-08-14 NOTE — Telephone Encounter (Signed)
 Procedure:COLON Procedure date: 08/21/24 Procedure location: [facility] Arrival Time: 9:06 Spoke with the patient Y/N: N Any prep concerns? N Has the patient obtained the prep from the pharmacy ? N Do you have a care partner and transportation: N Any additional concerns? N   I called patient and received no answer. Patient do not have a voice mailbox set up therefore I was not able to leave a message

## 2024-08-14 NOTE — Patient Instructions (Signed)
 _______________________________________________________  If your blood pressure at your visit was 140/90 or greater, please contact your primary care physician to follow up on this.  _______________________________________________________  If you are age 60 or older, your body mass index should be between 23-30. Your Body mass index is 36.25 kg/m. If this is out of the aforementioned range listed, please consider follow up with your Primary Care Provider.  If you are age 27 or younger, your body mass index should be between 19-25. Your Body mass index is 36.25 kg/m. If this is out of the aformentioned range listed, please consider follow up with your Primary Care Provider.   ________________________________________________________  The [facility] providers would like to encourage you to use MYCHART to communicate with providers for non-urgent requests or questions.  Due to long hold times on the telephone, sending your provider a message by [facility] may be a faster and more efficient way to get a response.  Please allow 48 business hours for a response.  Please remember that this is for non-urgent requests.  _______________________________________________________  [facility] is using a team-based approach to care.  Your team is made up of your doctor and two to three APPS. Our APPS (Nurse Practitioners and Physician Assistants) work with your physician to ensure care continuity for you. They are fully qualified to address your health concerns and develop a treatment plan. They communicate directly with your gastroenterologist to care for you. Seeing the Advanced Practice Practitioners on your physician's team can help you by facilitating care more promptly, often allowing for earlier appointments, access to diagnostic testing, procedures, and other specialty referrals.   You have been scheduled for a colonoscopy. Please follow written instructions given to you at your visit today.    If you use inhalers (even only as needed), please bring them with you on the day of your procedure.  DO NOT TAKE 7 DAYS PRIOR TO TEST- Trulicity (dulaglutide) Ozempic, Wegovy (semaglutide) Mounjaro, Zepbound (tirzepatide) Bydureon Bcise (exanatide extended release)  DO NOT TAKE 1 DAY PRIOR TO YOUR TEST Rybelsus (semaglutide) Adlyxin (lixisenatide) Victoza (liraglutide) Byetta (exanatide) ___________________________________________________________________________   Due to recent changes in healthcare laws, you may see the results of your imaging and laboratory studies on MyChart before your provider has had a chance to review them.  We understand that in some cases there may be results that are confusing or concerning to you. Not all laboratory results come back in the same time frame and the provider may be waiting for multiple results in order to interpret others.  Please give us  48 hours in order for your provider to thoroughly review all the results before contacting the office for clarification of your results.

## 2024-08-16 ENCOUNTER — Other Ambulatory Visit: Payer: Self-pay

## 2024-08-16 ENCOUNTER — Emergency Department (HOSPITAL_COMMUNITY)

## 2024-08-16 ENCOUNTER — Emergency Department (HOSPITAL_COMMUNITY)
Admission: EM | Admit: 2024-08-16 | Discharge: 2024-08-17 | Disposition: A | Discharge status: Home / Self Care | Attending: Emergency Medicine | Admitting: Emergency Medicine

## 2024-08-16 ENCOUNTER — Encounter (HOSPITAL_COMMUNITY): Payer: Self-pay | Admitting: *Deleted

## 2024-08-16 DIAGNOSIS — Z992 Dependence on renal dialysis: Secondary | ICD-10-CM | POA: Diagnosis not present

## 2024-08-16 DIAGNOSIS — S62344A Nondisplaced fracture of base of fourth metacarpal bone, right hand, initial encounter for closed fracture: Secondary | ICD-10-CM | POA: Insufficient documentation

## 2024-08-16 DIAGNOSIS — Z79899 Other long term (current) drug therapy: Secondary | ICD-10-CM | POA: Insufficient documentation

## 2024-08-16 DIAGNOSIS — M25561 Pain in right knee: Secondary | ICD-10-CM | POA: Insufficient documentation

## 2024-08-16 DIAGNOSIS — S6991XA Unspecified injury of right wrist, hand and finger(s), initial encounter: Secondary | ICD-10-CM | POA: Diagnosis present

## 2024-08-16 DIAGNOSIS — W19XXXA Unspecified fall, initial encounter: Secondary | ICD-10-CM | POA: Insufficient documentation

## 2024-08-16 MED ORDER — HYDROCODONE-ACETAMINOPHEN 5-325 MG PO TABS: 1.0000 | ORAL_TABLET | ORAL | 0 refills | Status: AC | PRN

## 2024-08-16 MED ORDER — NAPROXEN 500 MG PO TABS: 500.0000 mg | ORAL_TABLET | Freq: Two times a day (BID) | ORAL | 0 refills | Status: DC

## 2024-08-16 MED ORDER — OXYCODONE-ACETAMINOPHEN 5-325 MG PO TABS
1.0000 | ORAL_TABLET | Freq: Once | ORAL | Status: DC
  Filled 2024-08-16: qty 1

## 2024-08-16 NOTE — ED Provider Triage Note (Signed)
 Emergency Medicine Provider Triage Evaluation Note  Devin Bullock , a 59 y.o. male  was evaluated in triage.  Pt states he accidentally tripped and fell yesterday at afternoon..  Patient states since that time he has been having pain primarily in his hand.  He also has some pain in his wrist and knee.  He denies any loss of consciousness.  No difficulty breathing.  Review of Systems  Positive: Fall Negative: Loss of consciousness  Physical Exam  BP 135/73   Pulse 84   Temp 99.1 F (37.3 C) (Oral)   Resp 18   Ht 1.93 m (6' 4)   Wt 135.1 kg   SpO2 100%   BMI 36.25 kg/m  Gen:   Awake, no distress   Resp:  Normal effort  MSK:   Tenderness palpation right knee, tenderness and swelling noted in the right hand Other:    Medical Decision Making  Medically screening exam initiated at 1:29 PM.  Appropriate orders placed.  Devin Bullock was informed that the remainder of the evaluation will be completed by another provider, this initial triage assessment does not replace that evaluation, and the importance of remaining in the ED until their evaluation is complete.     [name], MD 08/16/24 1330

## 2024-08-16 NOTE — ED Notes (Signed)
Ortho tech consulted

## 2024-08-16 NOTE — ED Triage Notes (Signed)
 Pt here via [facility]  for mechanical fall.No loc. Not on blood thinners  Pt had dialysis on Tues and is due again on Fri.  Pt states he fell on his R wrist and pain to wrist is 10/10.  Also c/o pain to R kneecap.  VS:  Bp 140/86 Hr 89 95% ra Gcs 15 Rr 16

## 2024-08-16 NOTE — Discharge Instructions (Addendum)
 Your x-ray showed a fracture in the right hand.  Please keep the splint on, clean, dry.  You may take the prescribed medication to help with pain and inflammation.  Call the attached number for hand surgery to schedule a follow-up appointment.  If your knee continues to hurt you may schedule an appointment with your primary care provider for further evaluation.  Return to the emergency department if you develop any life-threatening symptoms.

## 2024-08-16 NOTE — Progress Notes (Signed)
 Orthopedic Tech Progress Note Patient Details:  Devin Bullock 11/04/1965 [id number]  Ortho Devices Type of Ortho Device: Ulna gutter splint, Ace wrap, Cotton web roll Ortho Device/Splint Location: R 5th metacarpal Ortho Device/Splint Interventions: Ordered, Application, Adjustment   Post Interventions Patient Tolerated: Fair Instructions Provided: Care of device  Devin Bullock 08/16/2024, 11:17 PM

## 2024-08-16 NOTE — ED Provider Notes (Signed)
 " [place] EMERGENCY DEPARTMENT AT [facility] Provider Note   CSN: [id number] Arrival date & time: 08/16/24  1313     Patient presents with: Devin Bullock is a 59 y.o. male.  Patient with ESRD on hemodialysis with neck scheduled hemodialysis appointment on Friday presents to the emergency department after a reported mechanical fall.  Patient states he fell on his right side and hurt his right wrist and has pain to the right kneecap.  He denies other injuries.    Fall       Prior to Admission medications  Medication Sig Start Date End Date Taking? Authorizing Provider  HYDROcodone -acetaminophen  (NORCO/VICODIN) 5-325 MG tablet Take 1 tablet by mouth every 4 (four) hours as needed for up to 3 days for moderate pain (pain score 4-6) or severe pain (pain score 7-10). 08/16/24 08/19/24 Yes Devin Ubaldo NOVAK, PA-C  naproxen (NAPROSYN) 500 MG tablet Take 1 tablet (500 mg total) by mouth 2 (two) times daily. 08/16/24  Yes Devin Ubaldo NOVAK, PA-C  atorvastatin  (LIPITOR) 40 MG tablet Take 1 tablet (40 mg total) by mouth daily. 05/25/23   Pearlean Manus, MD  B Complex-C-Folic Acid (RENA-VITE RX) 1 MG TABS Take 1 tablet by mouth daily. 11/21/23   [provider]  cinacalcet  (SENSIPAR ) 60 MG tablet Take 60 mg by mouth daily.    [provider]  guaifenesin  (ROBITUSSIN) 100 MG/5ML syrup Take 200 mg by mouth 3 (three) times daily as needed for cough.    [provider]  hydrocortisone  (ANUSOL -HC) 2.5 % rectal cream Place 1 Application rectally 2 (two) times daily. Patient not taking: Reported on 08/14/2024 05/25/24   Caleen Burgess BROCKS, MD  Methoxy PEG-Epoetin  Beta (MIRCERA IJ) Mircera 10/17/23 10/15/24  [provider]  metoprolol  tartrate (LOPRESSOR ) 25 MG tablet Take 12.5 mg by mouth 2 (two) times daily. 04/09/24   [provider]  midodrine  (PROAMATINE ) 5 MG tablet Take 5 mg by mouth. Taking Monday, Wednesday and Friday at dialysis 10/07/23   [provider]  sevelamer  carbonate (RENVELA ) 800 MG tablet Take 2,400 mg by mouth 3 (three) times daily with meals. Take 1 tablet by mouth a snack 05/17/23   [provider]    Allergies: Patient has no known allergies.    Review of Systems  Updated Vital Signs BP (!) 149/86 (BP Location: Right Arm)   Pulse 84   Temp 98.5 F (36.9 C) (Oral)   Resp 20   Ht 6' 4 (1.93 m)   Wt 135.1 kg   SpO2 100%   BMI 36.25 kg/m   Physical Exam Vitals and nursing note reviewed.  HENT:     Head: Normocephalic and atraumatic.  Eyes:     Pupils: Pupils are equal, round, and reactive to light.  Pulmonary:     Effort: Pulmonary effort is normal. No respiratory distress.  Musculoskeletal:        General: Swelling, tenderness and signs of injury present. No deformity.     Cervical back: Normal range of motion.     Comments: Patient with diffuse swelling to the posterior right hand, point tenderness near the base of the 4th/5th metacarpal.  Patient is able to move all fingers and has brisk cap refill.  Sensation intact. Right knee with tenderness but no obvious deformity.  Skin:    General: Skin is dry.  Neurological:     Mental Status: He is alert.  Psychiatric:        Speech:  Speech normal.        Behavior: Behavior normal.     (all labs ordered are listed, but only abnormal results are displayed) Labs Reviewed - No data to display  EKG: None  Radiology: DG Elbow Complete Right Result Date: 08/16/2024 EXAM: 3 VIEW(S) XRAY OF THE ELBOW COMPARISON: None available. CLINICAL HISTORY: fall, pain FINDINGS: BONES AND JOINTS: No acute fracture. No malalignment. SOFT TISSUES: The soft tissues are unremarkable. IMPRESSION: 1. No acute fracture or dislocation. Electronically signed by: Morgane Naveau MD 08/16/2024 02:52 PM EST RP Workstation: [id number]   DG Hand Complete Right Result Date: 08/16/2024 EXAM: 3 OR MORE VIEW(S) XRAY OF THE HAND 08/16/2024 02:41:00 PM COMPARISON: None  available. CLINICAL HISTORY: fall, pain FINDINGS: BONES AND JOINTS: Mildly displaced fracture of the base of the fifth metacarpal. Possible associated avulsion fracture along the lateral aspect of the fifth metacarpal base. SOFT TISSUES: Diffuse soft tissue edema of the hand. Atherosclerotic vascular calcifications. IMPRESSION: 1. Mildly displaced fracture of the base of the fifth metacarpal with possible associated avulsion fracture along the lateral aspect of the fifth metacarpal base. 2. Diffuse soft tissue edema of the hand. Electronically signed by: Morgane Naveau MD 08/16/2024 02:52 PM EST RP Workstation: [id number]   DG Knee Complete 4 Views Right Result Date: 08/16/2024 EXAM: 4 OR MORE VIEW(S) XRAY OF THE KNEE 08/16/2024 02:41:00 PM COMPARISON: None available. CLINICAL HISTORY: fall wrist pain FINDINGS: BONES AND JOINTS: No acute fracture. No malalignment. No significant joint effusion. SOFT TISSUES: Vascular calcifications are present in the soft tissues. IMPRESSION: 1. No acute osseous abnormality. Electronically signed by: Morgane Naveau MD 08/16/2024 02:51 PM EST RP Workstation: [id number]   DG Wrist Complete Right Result Date: 08/16/2024 EXAM: 3 OR MORE VIEW(S) XRAY OF THE WRIST 08/16/2024 02:41:00 PM COMPARISON: None available. CLINICAL HISTORY: fall wrist pain FINDINGS: BONES AND JOINTS: Possible nondisplaced fracture at the base of the fifth metacarpal. No malalignment. SOFT TISSUES: Soft tissue edema of the hand. IMPRESSION: 1. Possible nondisplaced fracture at the base of the fifth metacarpal. Please see separately dictated x-ray right hand 08/16/24. 2. Soft tissue edema of the hand. Electronically signed by: Morgane Naveau MD 08/16/2024 02:51 PM EST RP Workstation: [id number]     .Ortho Injury Treatment  Date/Time: 08/16/2024 10:53 PM  Performed by: Devin Ubaldo NOVAK, PA-C Authorized by: Devin Ubaldo NOVAK, PA-C   Consent:    Consent obtained:  Verbal   Consent given by:  PatientInjury  location: hand Location details: right hand Injury type: fracture-dislocation Pre-procedure neurovascular assessment: neurovascularly intact Immobilization: splint Splint type: ulnar gutter Splint Applied by: Ortho Tech Supplies used: Ortho-Glass Post-procedure neurovascular assessment: post-procedure neurovascularly intact      Medications Ordered in the ED  oxyCODONE -acetaminophen  (PERCOCET/ROXICET) 5-325 MG per tablet 1 tablet (has no administration in time range)                                    Medical Decision Making Risk Prescription drug management.   Patient presents to the emergency department with a chief complaint of hand and knee pain.  Differential diagnosis includes but is not limited to fracture dislocation, soft tissue injury, others  I ordered and interpreted imaging including plain films of the right hand and elbow, right wrist, and right knee.  Knee films show no acute abnormalities.  Right hand x-ray shows:1. Mildly displaced fracture of the base of the fifth metacarpal with possible associated avulsion fracture  along the lateral aspect of the fifth metacarpal base. 2. Diffuse soft tissue edema of the hand. I agree with the radiologist's findings.  I ordered the patient Percocet for his pain.  Upon reassessment the pain had improved  The patient was splinted with an ulnar gutter splint.  At this time patient appears stable for discharge home with outpatient hand surgery follow-up.  Patient provided with prescription for Naprosyn for inflammation and a short course of pain medication.      Final diagnoses:  Fall, initial encounter  Closed nondisplaced fracture of base of fourth metacarpal bone of right hand, initial encounter  Acute pain of right knee    ED Discharge Orders          Ordered    HYDROcodone -acetaminophen  (NORCO/VICODIN) 5-325 MG tablet  Every 4 hours PRN        08/16/24 2258    naproxen (NAPROSYN) 500 MG tablet  2 times daily         08/16/24 2258               Devin Bullock 08/16/24 2320  "

## 2024-08-17 NOTE — Telephone Encounter (Signed)
 See phone note 12/30.

## 2024-08-17 NOTE — ED Notes (Signed)
Patient given a cab voucher.

## 2024-08-17 NOTE — Telephone Encounter (Signed)
 Called patient to confirm colonoscopy. Patient had a fall yesterday & broke his hand, and will need to reschedule. Pt advised we will contact him with a new date once schedules are reviewed.

## 2024-08-21 ENCOUNTER — Encounter (HOSPITAL_COMMUNITY): Payer: Self-pay

## 2024-08-21 ENCOUNTER — Other Ambulatory Visit: Payer: Self-pay

## 2024-08-21 ENCOUNTER — Encounter (HOSPITAL_COMMUNITY): Admission: RE | Payer: Self-pay | Source: Home / Self Care

## 2024-08-21 ENCOUNTER — Inpatient Hospital Stay (HOSPITAL_COMMUNITY)
Admission: EM | Admit: 2024-08-21 | Discharge: 2024-08-26 | DRG: 393 | Disposition: A | Discharge status: Home / Self Care | Attending: Internal Medicine | Admitting: Internal Medicine

## 2024-08-21 ENCOUNTER — Ambulatory Visit (HOSPITAL_COMMUNITY): Admission: RE | Admit: 2024-08-21 | Source: Home / Self Care | Admitting: Pediatrics

## 2024-08-21 DIAGNOSIS — K627 Radiation proctitis: Principal | ICD-10-CM | POA: Diagnosis present

## 2024-08-21 DIAGNOSIS — Z91119 Patient's noncompliance with dietary regimen due to unspecified reason: Secondary | ICD-10-CM

## 2024-08-21 DIAGNOSIS — I5030 Unspecified diastolic (congestive) heart failure: Secondary | ICD-10-CM | POA: Diagnosis present

## 2024-08-21 DIAGNOSIS — Z818 Family history of other mental and behavioral disorders: Secondary | ICD-10-CM

## 2024-08-21 DIAGNOSIS — I132 Hypertensive heart and chronic kidney disease with heart failure and with stage 5 chronic kidney disease, or end stage renal disease: Secondary | ICD-10-CM | POA: Diagnosis present

## 2024-08-21 DIAGNOSIS — D120 Benign neoplasm of cecum: Secondary | ICD-10-CM | POA: Diagnosis present

## 2024-08-21 DIAGNOSIS — E66812 Obesity, class 2: Secondary | ICD-10-CM | POA: Diagnosis present

## 2024-08-21 DIAGNOSIS — N186 End stage renal disease: Secondary | ICD-10-CM | POA: Diagnosis present

## 2024-08-21 DIAGNOSIS — K5730 Diverticulosis of large intestine without perforation or abscess without bleeding: Secondary | ICD-10-CM | POA: Diagnosis present

## 2024-08-21 DIAGNOSIS — Z9079 Acquired absence of other genital organ(s): Secondary | ICD-10-CM

## 2024-08-21 DIAGNOSIS — C679 Malignant neoplasm of bladder, unspecified: Secondary | ICD-10-CM | POA: Diagnosis present

## 2024-08-21 DIAGNOSIS — D122 Benign neoplasm of ascending colon: Secondary | ICD-10-CM | POA: Diagnosis present

## 2024-08-21 DIAGNOSIS — Z811 Family history of alcohol abuse and dependence: Secondary | ICD-10-CM

## 2024-08-21 DIAGNOSIS — S6291XA Unspecified fracture of right wrist and hand, initial encounter for closed fracture: Secondary | ICD-10-CM | POA: Diagnosis present

## 2024-08-21 DIAGNOSIS — R310 Gross hematuria: Secondary | ICD-10-CM | POA: Diagnosis present

## 2024-08-21 DIAGNOSIS — Z6836 Body mass index (BMI) 36.0-36.9, adult: Secondary | ICD-10-CM

## 2024-08-21 DIAGNOSIS — K640 First degree hemorrhoids: Secondary | ICD-10-CM | POA: Diagnosis present

## 2024-08-21 DIAGNOSIS — C61 Malignant neoplasm of prostate: Secondary | ICD-10-CM | POA: Diagnosis present

## 2024-08-21 DIAGNOSIS — I5042 Chronic combined systolic (congestive) and diastolic (congestive) heart failure: Secondary | ICD-10-CM | POA: Diagnosis present

## 2024-08-21 DIAGNOSIS — Z8551 Personal history of malignant neoplasm of bladder: Secondary | ICD-10-CM

## 2024-08-21 DIAGNOSIS — Z8249 Family history of ischemic heart disease and other diseases of the circulatory system: Secondary | ICD-10-CM

## 2024-08-21 DIAGNOSIS — N2581 Secondary hyperparathyroidism of renal origin: Secondary | ICD-10-CM | POA: Diagnosis present

## 2024-08-21 DIAGNOSIS — D631 Anemia in chronic kidney disease: Secondary | ICD-10-CM | POA: Diagnosis present

## 2024-08-21 DIAGNOSIS — I9589 Other hypotension: Secondary | ICD-10-CM | POA: Diagnosis present

## 2024-08-21 DIAGNOSIS — K648 Other hemorrhoids: Secondary | ICD-10-CM

## 2024-08-21 DIAGNOSIS — Z825 Family history of asthma and other chronic lower respiratory diseases: Secondary | ICD-10-CM

## 2024-08-21 DIAGNOSIS — Z992 Dependence on renal dialysis: Secondary | ICD-10-CM

## 2024-08-21 DIAGNOSIS — D62 Acute posthemorrhagic anemia: Secondary | ICD-10-CM | POA: Diagnosis present

## 2024-08-21 DIAGNOSIS — Y842 Radiological procedure and radiotherapy as the cause of abnormal reaction of the patient, or of later complication, without mention of misadventure at the time of the procedure: Secondary | ICD-10-CM | POA: Diagnosis present

## 2024-08-21 DIAGNOSIS — K6389 Other specified diseases of intestine: Secondary | ICD-10-CM

## 2024-08-21 DIAGNOSIS — Z833 Family history of diabetes mellitus: Secondary | ICD-10-CM

## 2024-08-21 DIAGNOSIS — D123 Benign neoplasm of transverse colon: Secondary | ICD-10-CM | POA: Diagnosis present

## 2024-08-21 DIAGNOSIS — R319 Hematuria, unspecified: Secondary | ICD-10-CM

## 2024-08-21 DIAGNOSIS — Z923 Personal history of irradiation: Secondary | ICD-10-CM

## 2024-08-21 DIAGNOSIS — Z823 Family history of stroke: Secondary | ICD-10-CM

## 2024-08-21 DIAGNOSIS — D649 Anemia, unspecified: Secondary | ICD-10-CM

## 2024-08-21 DIAGNOSIS — E669 Obesity, unspecified: Secondary | ICD-10-CM | POA: Diagnosis present

## 2024-08-21 DIAGNOSIS — D124 Benign neoplasm of descending colon: Secondary | ICD-10-CM | POA: Diagnosis present

## 2024-08-21 DIAGNOSIS — K922 Gastrointestinal hemorrhage, unspecified: Principal | ICD-10-CM | POA: Diagnosis present

## 2024-08-21 DIAGNOSIS — D125 Benign neoplasm of sigmoid colon: Secondary | ICD-10-CM | POA: Diagnosis present

## 2024-08-21 LAB — CBC WITH DIFFERENTIAL/PLATELET
Abs Immature Granulocytes: 0.02 K/uL (ref 0.00–0.07)
Basophils Absolute: 0 K/uL (ref 0.0–0.1)
Basophils Relative: 1 %
Eosinophils Absolute: 0.1 K/uL (ref 0.0–0.5)
Eosinophils Relative: 3 %
HCT: 25 % — ABNORMAL LOW (ref 39.0–52.0)
Hemoglobin: 7.6 g/dL — ABNORMAL LOW (ref 13.0–17.0)
Immature Granulocytes: 0 %
Lymphocytes Relative: 22 %
Lymphs Abs: 1.3 K/uL (ref 0.7–4.0)
MCH: 29.5 pg (ref 26.0–34.0)
MCHC: 30.4 g/dL (ref 30.0–36.0)
MCV: 96.9 fL (ref 80.0–100.0)
Monocytes Absolute: 0.7 K/uL (ref 0.1–1.0)
Monocytes Relative: 12 %
Neutro Abs: 3.6 K/uL (ref 1.7–7.7)
Neutrophils Relative %: 62 %
Platelets: 174 K/uL (ref 150–400)
RBC: 2.58 MIL/uL — ABNORMAL LOW (ref 4.22–5.81)
RDW: 17.7 % — ABNORMAL HIGH (ref 11.5–15.5)
WBC: 5.7 K/uL (ref 4.0–10.5)
nRBC: 0 % (ref 0.0–0.2)

## 2024-08-21 LAB — COMPREHENSIVE METABOLIC PANEL WITH GFR
ALT: 13 U/L (ref 0–44)
AST: 29 U/L (ref 15–41)
Albumin: 3.9 g/dL (ref 3.5–5.0)
Alkaline Phosphatase: 142 U/L — ABNORMAL HIGH (ref 38–126)
Anion gap: 13 (ref 5–15)
BUN: 48 mg/dL — ABNORMAL HIGH (ref 6–20)
CO2: 33 mmol/L — ABNORMAL HIGH (ref 22–32)
Calcium: 9.7 mg/dL (ref 8.9–10.3)
Chloride: 95 mmol/L — ABNORMAL LOW (ref 98–111)
Creatinine, Ser: 10.5 mg/dL — ABNORMAL HIGH (ref 0.61–1.24)
GFR, Estimated: 5 mL/min — ABNORMAL LOW
Glucose, Bld: 138 mg/dL — ABNORMAL HIGH (ref 70–99)
Potassium: 3.7 mmol/L (ref 3.5–5.1)
Sodium: 141 mmol/L (ref 135–145)
Total Bilirubin: 0.3 mg/dL (ref 0.0–1.2)
Total Protein: 7 g/dL (ref 6.5–8.1)

## 2024-08-21 LAB — PROTIME-INR
INR: 1.2 (ref 0.8–1.2)
Prothrombin Time: 15.7 s — ABNORMAL HIGH (ref 11.4–15.2)

## 2024-08-21 SURGERY — COLONOSCOPY
Anesthesia: Monitor Anesthesia Care

## 2024-08-21 MED ORDER — ACETAMINOPHEN 325 MG PO TABS
650.0000 mg | ORAL_TABLET | Freq: Once | ORAL | Status: AC
  Administered 2024-08-21: 650 mg via ORAL
  Filled 2024-08-21: qty 2

## 2024-08-21 NOTE — ED Triage Notes (Signed)
 Pt bib [facility] from home. C/o of blood in urine and passing clots with BM. Had dialysis yesterday. Broke wrist on 08/17/2023. Pt states the anti-inflammatory medicine I was given for my wrist is the cause.

## 2024-08-21 NOTE — ED Provider Triage Note (Signed)
 Emergency Medicine Provider Triage Evaluation Note  Devin Bullock , a 59 y.o. male  was evaluated in triage.  Pt complains of blood in his urine and stool.  Patient has a history of end-stage renal disease on dialysis, chronic kidney disease, CHF, parathyroid disorder.  Bladder cancer.  Patient states he has history of prior GI bleeding.  Patient states today he started noticing blood passing in his urine as well as his stool.  Patient states he has passed clots from his rectal area.  He is also passed stringy clots of blood from his penis.  Patient states he is required blood transfusions in the past.  He started to feel lightheaded.  He came to the ED for evaluation. Patient states he was supposed to have a colonoscopy today.  Patient states he did not go because of the medications he was given recently for his hand Review of Systems  Positive: GI bleeding Negative: Abdominal pain  Physical Exam  BP 102/74 (BP Location: Right Arm)   Pulse (!) 113   Temp 99.2 F (37.3 C) (Oral)   Resp 18   Ht 1.93 m (6' 4)   Wt (!) 137 kg   SpO2 98%   BMI 36.76 kg/m  Gen:   Awake, no distress   Resp:  Normal effort  MSK:   Moves extremities without difficulty  Other:    Medical Decision Making  Medically screening exam initiated at 5:36 PM.  Appropriate orders placed.  Devin Bullock was informed that the remainder of the evaluation will be completed by another provider, this initial triage assessment does not replace that evaluation, and the importance of remaining in the ED until their evaluation is complete.     [name], MD 08/21/24 1745

## 2024-08-22 DIAGNOSIS — N186 End stage renal disease: Secondary | ICD-10-CM | POA: Diagnosis present

## 2024-08-22 DIAGNOSIS — D631 Anemia in chronic kidney disease: Secondary | ICD-10-CM | POA: Diagnosis present

## 2024-08-22 DIAGNOSIS — Z9079 Acquired absence of other genital organ(s): Secondary | ICD-10-CM | POA: Diagnosis not present

## 2024-08-22 DIAGNOSIS — D62 Acute posthemorrhagic anemia: Secondary | ICD-10-CM

## 2024-08-22 DIAGNOSIS — N2581 Secondary hyperparathyroidism of renal origin: Secondary | ICD-10-CM | POA: Diagnosis present

## 2024-08-22 DIAGNOSIS — K640 First degree hemorrhoids: Secondary | ICD-10-CM | POA: Diagnosis present

## 2024-08-22 DIAGNOSIS — E669 Obesity, unspecified: Secondary | ICD-10-CM | POA: Diagnosis present

## 2024-08-22 DIAGNOSIS — E66812 Obesity, class 2: Secondary | ICD-10-CM

## 2024-08-22 DIAGNOSIS — Z923 Personal history of irradiation: Secondary | ICD-10-CM | POA: Diagnosis not present

## 2024-08-22 DIAGNOSIS — D122 Benign neoplasm of ascending colon: Secondary | ICD-10-CM | POA: Diagnosis present

## 2024-08-22 DIAGNOSIS — Z823 Family history of stroke: Secondary | ICD-10-CM | POA: Diagnosis not present

## 2024-08-22 DIAGNOSIS — Y842 Radiological procedure and radiotherapy as the cause of abnormal reaction of the patient, or of later complication, without mention of misadventure at the time of the procedure: Secondary | ICD-10-CM | POA: Diagnosis present

## 2024-08-22 DIAGNOSIS — I5020 Unspecified systolic (congestive) heart failure: Secondary | ICD-10-CM | POA: Diagnosis not present

## 2024-08-22 DIAGNOSIS — Z8551 Personal history of malignant neoplasm of bladder: Secondary | ICD-10-CM

## 2024-08-22 DIAGNOSIS — Z825 Family history of asthma and other chronic lower respiratory diseases: Secondary | ICD-10-CM | POA: Diagnosis not present

## 2024-08-22 DIAGNOSIS — I5042 Chronic combined systolic (congestive) and diastolic (congestive) heart failure: Secondary | ICD-10-CM | POA: Diagnosis present

## 2024-08-22 DIAGNOSIS — Z992 Dependence on renal dialysis: Secondary | ICD-10-CM

## 2024-08-22 DIAGNOSIS — Z8249 Family history of ischemic heart disease and other diseases of the circulatory system: Secondary | ICD-10-CM | POA: Diagnosis not present

## 2024-08-22 DIAGNOSIS — D120 Benign neoplasm of cecum: Secondary | ICD-10-CM | POA: Diagnosis not present

## 2024-08-22 DIAGNOSIS — K627 Radiation proctitis: Secondary | ICD-10-CM | POA: Diagnosis present

## 2024-08-22 DIAGNOSIS — Z91119 Patient's noncompliance with dietary regimen due to unspecified reason: Secondary | ICD-10-CM | POA: Diagnosis not present

## 2024-08-22 DIAGNOSIS — D175 Benign lipomatous neoplasm of intra-abdominal organs: Secondary | ICD-10-CM | POA: Diagnosis not present

## 2024-08-22 DIAGNOSIS — R310 Gross hematuria: Secondary | ICD-10-CM

## 2024-08-22 DIAGNOSIS — K922 Gastrointestinal hemorrhage, unspecified: Secondary | ICD-10-CM | POA: Diagnosis not present

## 2024-08-22 DIAGNOSIS — Z833 Family history of diabetes mellitus: Secondary | ICD-10-CM | POA: Diagnosis not present

## 2024-08-22 DIAGNOSIS — I132 Hypertensive heart and chronic kidney disease with heart failure and with stage 5 chronic kidney disease, or end stage renal disease: Secondary | ICD-10-CM | POA: Diagnosis present

## 2024-08-22 DIAGNOSIS — S6291XA Unspecified fracture of right wrist and hand, initial encounter for closed fracture: Secondary | ICD-10-CM | POA: Diagnosis present

## 2024-08-22 DIAGNOSIS — I5033 Acute on chronic diastolic (congestive) heart failure: Secondary | ICD-10-CM

## 2024-08-22 DIAGNOSIS — Z818 Family history of other mental and behavioral disorders: Secondary | ICD-10-CM | POA: Diagnosis not present

## 2024-08-22 DIAGNOSIS — I5030 Unspecified diastolic (congestive) heart failure: Secondary | ICD-10-CM | POA: Diagnosis present

## 2024-08-22 DIAGNOSIS — D124 Benign neoplasm of descending colon: Secondary | ICD-10-CM | POA: Diagnosis present

## 2024-08-22 DIAGNOSIS — C679 Malignant neoplasm of bladder, unspecified: Secondary | ICD-10-CM | POA: Diagnosis present

## 2024-08-22 DIAGNOSIS — K6389 Other specified diseases of intestine: Secondary | ICD-10-CM | POA: Diagnosis not present

## 2024-08-22 DIAGNOSIS — Z811 Family history of alcohol abuse and dependence: Secondary | ICD-10-CM | POA: Diagnosis not present

## 2024-08-22 DIAGNOSIS — C61 Malignant neoplasm of prostate: Secondary | ICD-10-CM | POA: Diagnosis present

## 2024-08-22 DIAGNOSIS — K921 Melena: Secondary | ICD-10-CM | POA: Diagnosis present

## 2024-08-22 DIAGNOSIS — D125 Benign neoplasm of sigmoid colon: Secondary | ICD-10-CM | POA: Diagnosis not present

## 2024-08-22 DIAGNOSIS — I9589 Other hypotension: Secondary | ICD-10-CM | POA: Diagnosis present

## 2024-08-22 DIAGNOSIS — D123 Benign neoplasm of transverse colon: Secondary | ICD-10-CM | POA: Diagnosis not present

## 2024-08-22 LAB — CBC WITH DIFFERENTIAL/PLATELET
Abs Immature Granulocytes: 0.04 K/uL (ref 0.00–0.07)
Basophils Absolute: 0 K/uL (ref 0.0–0.1)
Basophils Relative: 0 %
Eosinophils Absolute: 0.1 K/uL (ref 0.0–0.5)
Eosinophils Relative: 3 %
HCT: 21.7 % — ABNORMAL LOW (ref 39.0–52.0)
Hemoglobin: 6.5 g/dL — CL (ref 13.0–17.0)
Immature Granulocytes: 1 %
Lymphocytes Relative: 16 %
Lymphs Abs: 0.7 K/uL (ref 0.7–4.0)
MCH: 28.8 pg (ref 26.0–34.0)
MCHC: 30 g/dL (ref 30.0–36.0)
MCV: 96 fL (ref 80.0–100.0)
Monocytes Absolute: 0.5 K/uL (ref 0.1–1.0)
Monocytes Relative: 11 %
Neutro Abs: 3.2 K/uL (ref 1.7–7.7)
Neutrophils Relative %: 69 %
Platelets: 165 K/uL (ref 150–400)
RBC: 2.26 MIL/uL — ABNORMAL LOW (ref 4.22–5.81)
RDW: 17.7 % — ABNORMAL HIGH (ref 11.5–15.5)
WBC: 4.6 K/uL (ref 4.0–10.5)
nRBC: 0 % (ref 0.0–0.2)

## 2024-08-22 LAB — POC OCCULT BLOOD, ED: Fecal Occult Bld: POSITIVE — AB

## 2024-08-22 LAB — PREPARE RBC (CROSSMATCH)

## 2024-08-22 LAB — HEMOGLOBIN AND HEMATOCRIT, BLOOD
HCT: 22.6 % — ABNORMAL LOW (ref 39.0–52.0)
Hemoglobin: 7 g/dL — ABNORMAL LOW (ref 13.0–17.0)

## 2024-08-22 MED ORDER — HYDROCORTISONE (PERIANAL) 2.5 % EX CREA
1.0000 | TOPICAL_CREAM | Freq: Two times a day (BID) | CUTANEOUS | Status: DC
  Administered 2024-08-26: 1 via RECTAL
  Filled 2024-08-22 (×2): qty 28.35

## 2024-08-22 MED ORDER — SODIUM CHLORIDE 0.9% IV SOLUTION: Freq: Once | INTRAVENOUS | Status: AC

## 2024-08-22 MED ORDER — ACETAMINOPHEN 325 MG PO TABS: 650.0000 mg | ORAL_TABLET | Freq: Four times a day (QID) | ORAL | Status: DC | PRN

## 2024-08-22 MED ORDER — HYDROCODONE-ACETAMINOPHEN 5-325 MG PO TABS: 1.0000 | ORAL_TABLET | Freq: Four times a day (QID) | ORAL | Status: DC | PRN

## 2024-08-22 MED ORDER — CINACALCET HCL 30 MG PO TABS
60.0000 mg | ORAL_TABLET | Freq: Every day | ORAL | Status: DC
  Administered 2024-08-25: 60 mg via ORAL
  Filled 2024-08-22 (×4): qty 2

## 2024-08-22 MED ORDER — SODIUM CHLORIDE 0.9 % IV SOLN: INTRAVENOUS | Status: AC

## 2024-08-22 MED ORDER — SODIUM CHLORIDE 0.9% FLUSH
3.0000 mL | Freq: Two times a day (BID) | INTRAVENOUS | Status: DC
  Administered 2024-08-24 – 2024-08-26 (×5): 3 mL via INTRAVENOUS

## 2024-08-22 MED ORDER — ACETAMINOPHEN 650 MG RE SUPP: 650.0000 mg | Freq: Four times a day (QID) | RECTAL | Status: DC | PRN

## 2024-08-22 MED ORDER — ATORVASTATIN CALCIUM 40 MG PO TABS
40.0000 mg | ORAL_TABLET | Freq: Every day | ORAL | Status: DC
  Administered 2024-08-22 – 2024-08-26 (×5): 40 mg via ORAL
  Filled 2024-08-22 (×5): qty 1

## 2024-08-22 MED ORDER — POLYETHYLENE GLYCOL 3350 17 GM/SCOOP PO POWD
119.0000 g | Freq: Once | ORAL | Status: DC
  Filled 2024-08-22: qty 119

## 2024-08-22 MED ORDER — MIDODRINE HCL 5 MG PO TABS: 5.0000 mg | ORAL_TABLET | ORAL | Status: DC

## 2024-08-22 NOTE — ED Provider Notes (Signed)
 " [place] EMERGENCY DEPARTMENT AT [facility] Provider Note   CSN: [id number] Arrival date & time: 08/21/24  1723     Patient presents with: Hematuria   Devin Bullock is a 59 y.o. male.   The history is provided by the patient and medical records. No language interpreter was used.  Hematuria     59 year old male with history of end-stage renal disease currently on dialysis, CHF, obesity, hypertension, iron deficiency anemia bladder cancer brought here via EMS from home with complaints of blood in urine and rectum.  Patient states that he has history of rectal bleeding and was scheduled to have a colonoscopy today however 6 days ago he had a fall and broke his right wrist and fingers.  He was prescribed naproxen .  Since taking the medication he noticed progressive worsening rectal bleeding as well as blood coming from his penis.  He reportedly feels weak, with lightheadedness.  No chest pain or trouble breathing.  He is a Monday Wednesday Friday dialysis patient and last dialyzed 2 days ago.  He has had history of prior GI bleeding requiring blood transfusion in the past.  He have been waiting in the triage area for the past 21 hours and states he has bleeding significantly since.  Patient does not make urine but he has passed stringy clots of blood through his penis.  Patient felt the naproxen  may have worsen his rectal bleeding.  Prior to Admission medications  Medication Sig Start Date End Date Taking? Authorizing Provider  atorvastatin  (LIPITOR) 40 MG tablet Take 1 tablet (40 mg total) by mouth daily. 05/25/23   Pearlean Manus, MD  B Complex-C-Folic Acid (RENA-VITE RX) 1 MG TABS Take 1 tablet by mouth daily. 11/21/23   [provider]  cinacalcet  (SENSIPAR ) 60 MG tablet Take 60 mg by mouth daily.    [provider]  guaifenesin  (ROBITUSSIN) 100 MG/5ML syrup Take 200 mg by mouth 3 (three) times daily as needed for cough.    [provider]   hydrocortisone  (ANUSOL -HC) 2.5 % rectal cream Place 1 Application rectally 2 (two) times daily. Patient not taking: Reported on 08/14/2024 05/25/24   Caleen Burgess BROCKS, MD  Methoxy PEG-Epoetin  Beta (MIRCERA IJ) Mircera 10/17/23 10/15/24  [provider]  metoprolol  tartrate (LOPRESSOR ) 25 MG tablet Take 12.5 mg by mouth 2 (two) times daily. 04/09/24   [provider]  midodrine  (PROAMATINE ) 5 MG tablet Take 5 mg by mouth. Taking Monday, Wednesday and Friday at dialysis 10/07/23   [provider]  naproxen  (NAPROSYN ) 500 MG tablet Take 1 tablet (500 mg total) by mouth 2 (two) times daily. 08/16/24   Logan Ubaldo NOVAK, PA-C  sevelamer  carbonate (RENVELA ) 800 MG tablet Take 2,400 mg by mouth 3 (three) times daily with meals. Take 1 tablet by mouth a snack 05/17/23   [provider]    Allergies: Patient has no known allergies.    Review of Systems  Genitourinary:  Positive for hematuria.  All other systems reviewed and are negative.   Updated Vital Signs BP 127/75 (BP Location: Right Arm)   Pulse 85   Temp 98.7 F (37.1 C)   Resp 18   Ht 6' 4 (1.93 m)   Wt (!) 137 kg   SpO2 97%   BMI 36.76 kg/m   Physical Exam Constitutional:      General: He is not in acute distress.    Appearance: He is well-developed.  HENT:     Head: Atraumatic.  Eyes:     Conjunctiva/sclera: Conjunctivae normal.  Cardiovascular:     Rate and Rhythm: Normal rate and regular rhythm.     Pulses: Normal pulses.     Heart sounds: Normal heart sounds.  Pulmonary:     Effort: Pulmonary effort is normal.     Breath sounds: Normal breath sounds.  Abdominal:     Palpations: Abdomen is soft.     Tenderness: There is no abdominal tenderness.  Genitourinary:    Comments: Chaperone present during exam.  Normal rectal tone no obvious mass gross red blood noted on glove Musculoskeletal:     Cervical back: Normal range of motion and neck supple.     Comments: Patient is wearing a right  forearm splint.  Skin:    Findings: No rash.  Neurological:     Mental Status: He is alert.     (all labs ordered are listed, but only abnormal results are displayed) Labs Reviewed  COMPREHENSIVE METABOLIC PANEL WITH GFR - Abnormal; Notable for the following components:      Result Value   Chloride 95 (*)    CO2 33 (*)    Glucose, Bld 138 (*)    BUN 48 (*)    Creatinine, Ser 10.50 (*)    Alkaline Phosphatase 142 (*)    GFR, Estimated 5 (*)    All other components within normal limits  CBC WITH DIFFERENTIAL/PLATELET - Abnormal; Notable for the following components:   RBC 2.58 (*)    Hemoglobin 7.6 (*)    HCT 25.0 (*)    RDW 17.7 (*)    All other components within normal limits  PROTIME-INR - Abnormal; Notable for the following components:   Prothrombin Time 15.7 (*)    All other components within normal limits  CBC WITH DIFFERENTIAL/PLATELET  POC OCCULT BLOOD, ED  TYPE AND SCREEN  PREPARE RBC (CROSSMATCH)    EKG: None  Radiology: No results found.   .Critical Care  Performed by: Nivia Colon, PA-C Authorized by: Nivia Colon, PA-C   Critical care provider statement:    Critical care time (minutes):  30   Critical care was time spent personally by me on the following activities:  Development of treatment plan with patient or surrogate, discussions with consultants, evaluation of patient's response to treatment, examination of patient, ordering and review of laboratory studies, ordering and review of radiographic studies, ordering and performing treatments and interventions, pulse oximetry, re-evaluation of patient's condition and review of old charts    Medications Ordered in the ED  0.9 %  sodium chloride  infusion (Manually program via Guardrails IV Fluids) (has no administration in time range)  acetaminophen  (TYLENOL ) tablet 650 mg (650 mg Oral Given 08/21/24 1914)    Clinical Course as of 08/22/24 1539  Wed Aug 22, 2024  1530 Intermittent GI bleed, hx of bladder  cancer, scheduled for colonoscopy yesterday however 1 week ago broke a finger and is on naproxen  which may be cause  [CH]  1531 Giving blood, talked with GI already Kandra Hamman), medicine admit  [CH]  [phone] Unassigned hospitalist  [CH]    Clinical Course User Index [CH] Hinnant, Terrall FALCON, PA-C                                 Medical Decision Making Amount and/or Complexity of Data Reviewed Labs: ordered.  Risk Prescription drug management.   BP 127/75 (BP Location: Right Arm)  Pulse 85   Temp 98.7 F (37.1 C)   Resp 18   Ht 6' 4 (1.93 m)   Wt (!) 137 kg   SpO2 97%   BMI 36.76 kg/m   67:7 PM  59 year old male with history of end-stage renal disease currently on dialysis, CHF, obesity, hypertension, iron deficiency anemia bladder cancer brought here via EMS from home with complaints of blood in urine and rectum.  Patient states that he has history of rectal bleeding and was scheduled to have a colonoscopy today however 6 days ago he had a fall and broke his right wrist and fingers.  He was prescribed naproxen .  Since taking the medication he noticed progressive worsening rectal bleeding as well as blood coming from his penis.  He reportedly feels weak, with lightheadedness.  No chest pain or trouble breathing.  He is a Monday Wednesday Friday dialysis patient and last dialyzed 2 days ago.  He has had history of prior GI bleeding requiring blood transfusion in the past.  He have been waiting in the triage area for the past 21 hours and states he has bleeding significantly since.  Patient does not make urine but he has passed stringy clots of blood through his penis.  Patient felt the naproxen  may have worsen his rectal bleeding.  Exam notable for  -Labs ordered, independently viewed and interpreted by me.  Labs remarkable for hemoglobin of 7.6.  Since patient reports losing a lot more blood since he last had his blood drawn, we will recheck CBC but anticipate blood transfusion.   Evidence of kidney impairment which is chronic.  Patient has normal potassium therefore does not need emergent dialysis. -The patient was maintained on a cardiac monitor.  I personally viewed and interpreted the cardiac monitored which showed an underlying rhythm of: SR -Imaging including abd/pelvis CT considered but pt likely benefit more from colonoscopy -This patient presents to the ED for concern of rectal bleeding, this involves an extensive number of treatment options, and is a complaint that carries with it a high risk of complications and morbidity.  The differential diagnosis includes malignancy, UGIB, LGIB, hemorrhoid, colitis, diverticular bleed -Co morbidities that complicate the patient evaluation includes ESRD, CHF, iron deficicency anemia, bladder CA -Treatment includes blood transfusion -Reevaluation of the patient after these medicines showed that the patient stayed the same -PCP office notes or outside notes reviewed -Discussion with specialist [facility] provider Leita who agrees to evaluate pt.   -Escalation to admission/observation considered: patient to be admitted.    EMR reviewed patient was last seen by [facility] on 08/14/2024 for concern of hematochezia.  Initially thought his anemia and bleeding is likely related to perinephric hematoma however he is scheduled for colonoscopy for further evaluation.  Patient signed out to oncoming provider who will consult medicine for admission.  Patient will receive 1 unit of blood transfusion.     Final diagnoses:  Gastrointestinal hemorrhage, unspecified gastrointestinal hemorrhage type  Symptomatic anemia    ED Discharge Orders     None          Nivia Colon, PA-C 08/22/24 1542    Pamella Sharper A, DO 08/25/24 0720  "

## 2024-08-22 NOTE — Progress Notes (Signed)
"   Received: Devin Bullock, [name], MD  May, Deanna J, NP; P [facility] Triage; Suzann Inocente HERO, MD Just catching up on this patient but looks like he was scheduled for colonoscopy with Dr. Suzann today, but then it had to be cancelled because he broke his hand. We can schedule him for a clinic appt with me or APP in 1-2 months to check in and see where he is at with his broken hand and recovery from his upcoming bladder resection.    Called and spoke with pt to schedule clinic appt as recommended above. Pt states he was given naprosyn  for his broken hand and this has caused him to have bleeding. He is currently in the er at [facility] with rectal bleeding and blood coming out of his penis. Dr. Kidney notified., "

## 2024-08-22 NOTE — Consult Note (Addendum)
 "                                               Consultation Note   Referring Provider:   [facility] PCP: [facility] Primary Gastroenterologist:    Rosario JAYSON Kidney, MD    Reason for Consultation: Hematochezia DOA: 08/21/2024         Hospital Day: 2   Assessment and Plan:  59 year old male with chronic rectal bleeding with bowel movements now admitted with large-volume painless hematochezia ( different issue vrs worsening or same issue?).  Was scheduled for colonoscopy 08/21/24 for evaluation of the rectal bleeding but canceled following a fall in which he broke some of his fingers.  Rule out diverticular hemorrhage.  Patient has had radiation for prostate cancer though bleeding seems much more voluminous  than what would be expected with radiation proctitis.  Bleeding out of proportion it seems for hemorrhoids as well.  Given the concurrent large-volume gross hematuria, any c oncern for connection between bowel and bladder.   May need CTA if there is concern for hemodynamic instability related to bleeding. Otherwise we will prep this evening for colonoscopy tomorrow  Monitor H&H, transfuse if needed.   Acute on chronic anemia in setting of hematochezia and gross hematuria.  Presenting hemoglobin 7.6, down from mid 9 range (recent dialysis labs per patient)   Gross hematuria  History of prostate / bladder cancer s/p TURBT 2022 He has been undergoing workup by [facility]rology - [facility].  Sounds like source of bleeding has been unclear.  Had cystoscopy 07/23/2024 with findings of bleeding and blood clots .Scheduled to have TURBT with possible instillation of intravesical chemotherapy on 08/30/2024   History of spontaneous right perinephric hematoma June 2025  ESRD on HD MWF   HFpEF 55%  See PMH for any additional medical history  / medical problems  Active Problems:   * No active hospital problems. *   HISTORY OF PRESENT ILLNESS:  Brief History:  Patient was seen  by us  in the hospital in June 2025 for anemia.  He has been having intermittent rectal bleeding but also bleeding from his urinary tract.  He was supposed to have a colonoscopy but that was put on hold while he was trying to get scheduled for chemotherapy for bladder cancer . CT scan of the abdomen and pelvis during the June admission showed a retroperitoneal hematoma around his right kidney.  Blood loss was felt to be multifactorial but primarily due to hematuria and large perirenal retroperitoneal hematoma.  He was transfused several units of blood, given a follow-up office appointment to arrange for colonoscopy.  We saw him in our office in September, treated him empirically with Anusol  suppositories which did not alleviate the bleeding.  He was seen back in our office December 30 .  Note reviewed . Patient described ongoing bleeding with bowel movements. He was scheduled for colonoscopy with Dr. Suzann at [facility] to be done yesterday.  However he fell and broke his hand and needed to reschedule.  Interval history: Patient presented to the ED yesterday with complaints of passing blood clots from rectum and long strings of blood clots when he urinates .  This started a couple days ago after he began taking Naprosyn  prescribed after he broke his fingers following the fall.  He says the long stringy  blood clots from his penis are not unusual but he has never passed such massive amounts of blood from his rectum.  He has had chronic intermittent rectal bleeding with bowel movements but nothing like this.  He has no associated abdominal pain.  He is not taking any NSAIDs.  He was sitting in the ED for nearly 20 hours during which time he had multiple episodes of hematochezia.   In ED patient tachycardic ( resolved), normotensive. Hgb 7.6.  Creatine 10.5. K+ normal.    Recent Labs    08/21/24 1745  PROT 7.0  ALBUMIN  3.9  AST 29  ALT 13  ALKPHOS 142*  BILITOT 0.3   Recent Labs     08/21/24 1745  WBC 5.7  HGB 7.6*  HCT 25.0*  MCV 96.9  PLT 174   Recent Labs    08/21/24 1745  NA 141  K 3.7  CL 95*  CO2 33*  GLUCOSE 138*  BUN 48*  CREATININE 10.50*  CALCIUM  9.7    Prior Endoscopic Evaluations / GI Studies:     August 2021 EGD for coffee-ground emesis and anemia - Normal esophagus, Z-line regular, normal stomach, erythematous duodenopathy, first portion of duodenum and second portion of duodenum normal, gastric fundic lipoma, no specimens collected   Review of Systems: All systems reviewed and negative except where noted in HPI.  Physical Exam: Vital signs in last 24 hours: Temp:  [97.7 F (36.5 C)-99.2 F (37.3 C)] 98.7 F (37.1 C) (01/07 1410) Pulse Rate:  [85-113] 85 (01/07 1410) Resp:  [18-23] 18 (01/07 1410) BP: (102-127)/(60-75) 127/75 (01/07 1410) SpO2:  [94 %-98 %] 97 % (01/07 1410) Weight:  [862 kg] 137 kg (01/06 1730)   General:  Pleasant obese male in NAD Psych:  Cooperative. Normal mood and affect Eyes: Pupils equal Ears:  Normal auditory acuity Nose: No deformity, discharge or lesions Neck:  Supple, no masses felt Lungs:  Clear to auscultation.  Heart: regular rate, regular rhythm.  Abdomen:  Soft, nondistended, nontender, active bowel sounds, no masses felt Rectal :  Deferred Msk: Symmetrical without gross deformities.  Neurologic:  Alert, oriented, grossly normal neurologically Extremities : No edema Skin:  Intact without significant lesions.   OUTPATIENT MEDICATIONS Prior to Admission medications  Medication Sig Start Date End Date Taking? Authorizing Provider  atorvastatin  (LIPITOR) 40 MG tablet Take 1 tablet (40 mg total) by mouth daily. 05/25/23   Pearlean Manus, MD  B Complex-C-Folic Acid (RENA-VITE RX) 1 MG TABS Take 1 tablet by mouth daily. 11/21/23   [provider]  cinacalcet  (SENSIPAR ) 60 MG tablet Take 60 mg by mouth daily.    [provider]  guaifenesin  (ROBITUSSIN) 100 MG/5ML syrup Take  200 mg by mouth 3 (three) times daily as needed for cough.    [provider]  hydrocortisone  (ANUSOL -HC) 2.5 % rectal cream Place 1 Application rectally 2 (two) times daily. Patient not taking: Reported on 08/14/2024 05/25/24   Caleen Burgess BROCKS, MD  Methoxy PEG-Epoetin  Beta (MIRCERA IJ) Mircera 10/17/23 10/15/24  [provider]  metoprolol  tartrate (LOPRESSOR ) 25 MG tablet Take 12.5 mg by mouth 2 (two) times daily. 04/09/24   [provider]  midodrine  (PROAMATINE ) 5 MG tablet Take 5 mg by mouth. Taking Monday, Wednesday and Friday at dialysis 10/07/23   [provider]  naproxen  (NAPROSYN ) 500 MG tablet Take 1 tablet (500 mg total) by mouth 2 (two) times daily. 08/16/24   Logan Martinis B, PA-C  sevelamer  carbonate (RENVELA ) 800 MG  tablet Take 2,400 mg by mouth 3 (three) times daily with meals. Take 1 tablet by mouth a snack 05/17/23   [provider]    Allergies as of 08/21/2024   (No Known Allergies)    INPATIENT MEDICATIONS Current Facility-Administered Medications  Medication Dose Route Frequency Provider Last Rate Last Admin   0.9 %  sodium chloride  infusion (Manually program via Guardrails IV Fluids)   Intravenous Once Nivia Colon, PA-C       Current Outpatient Medications  Medication Sig Dispense Refill   atorvastatin  (LIPITOR) 40 MG tablet Take 1 tablet (40 mg total) by mouth daily. 30 tablet 2   B Complex-C-Folic Acid (RENA-VITE RX) 1 MG TABS Take 1 tablet by mouth daily.     cinacalcet  (SENSIPAR ) 60 MG tablet Take 60 mg by mouth daily.     guaifenesin  (ROBITUSSIN) 100 MG/5ML syrup Take 200 mg by mouth 3 (three) times daily as needed for cough.     hydrocortisone  (ANUSOL -HC) 2.5 % rectal cream Place 1 Application rectally 2 (two) times daily. (Patient not taking: Reported on 08/14/2024) 30 g 0   Methoxy PEG-Epoetin  Beta (MIRCERA IJ) Mircera     metoprolol  tartrate (LOPRESSOR ) 25 MG tablet Take 12.5 mg by mouth 2 (two) times daily.      midodrine  (PROAMATINE ) 5 MG tablet Take 5 mg by mouth. Taking Monday, Wednesday and Friday at dialysis     naproxen  (NAPROSYN ) 500 MG tablet Take 1 tablet (500 mg total) by mouth 2 (two) times daily. 30 tablet 0   sevelamer  carbonate (RENVELA ) 800 MG tablet Take 2,400 mg by mouth 3 (three) times daily with meals. Take 1 tablet by mouth a snack       Past Medical History:  Diagnosis Date   Acute on chronic combined systolic and diastolic CHF (congestive heart failure) (HCC) 04/13/2013   Anemia    AV fistula    Cancer (HCC)    prostate and bladder cancer - 2022 diagnosed   CHF (congestive heart failure) (HCC)    patient denies   CKD (chronic kidney disease) 07/03/2014   ESRD on hemodialysis (HCC)    [facility]   History of blood transfusion 03/2020   Hypertension    history of, no longer taking medication at this time   Morbid obesity (HCC)    Parathyroid disorder     Past Surgical History:  Procedure Laterality Date   AV FISTULA PLACEMENT Left 05/24/2014   Procedure: BRACHIOCEPHALIC ARTERIOVENOUS (AV) FISTULA CREATION;  Surgeon: Lonni GORMAN Blade, MD;  Location: [facility] OR;  Service: Vascular;  Laterality: Left;   AV FISTULA PLACEMENT Left 09/15/2023   Procedure: LEFT ARM ARTERIOVENOUS (AV) FISTULA REVISION AND PLICATION;  Surgeon: Gretta Lonni PARAS, MD;  Location: [facility] OR;  Service: Vascular;  Laterality: Left;   ESOPHAGOGASTRODUODENOSCOPY (EGD) WITH PROPOFOL  N/A 04/03/2020   Procedure: ESOPHAGOGASTRODUODENOSCOPY (EGD) WITH PROPOFOL ;  Surgeon: Saintclair Jasper, MD;  Location: [facility] ENDOSCOPY;  Service: Gastroenterology;  Laterality: N/A;   PROSTATE BIOPSY N/A 11/27/2020   Procedure: BIOPSY TRANSRECTAL ULTRASONIC PROSTATE (TUBP);  Surgeon: Carolee Sherwood JONETTA DOUGLAS, MD;  Location: [facility] ORS;  Service: Urology;  Laterality: N/A;  REQUESTING 84 MINS FOR CASE   REVISON OF ARTERIOVENOUS FISTULA Left 01/08/2021   Procedure: REVISON PLICATION OF ARTERIOVENOUS FISTULA LEFT;  Surgeon: Sheree Penne Lonni, MD;  [facility];  Service: Vascular;  Laterality: Left;   SURGICAL EXCISION OF EXCESSIVE SKIN Left 09/15/2023   Procedure: SURGICAL EXCISION OF EXCESSIVE SKIN, ULCERATIVE SKIN;  Surgeon: Gretta Lonni  J, MD;  Location: [facility] OR;  Service: Vascular;  Laterality: Left;   THROMBECTOMY W/ EMBOLECTOMY Left 09/15/2023   Procedure: THROMBECTOMY ARTERIOVENOUS FISTULA;  Surgeon: Gretta Lonni PARAS, MD;  Location: [facility] OR;  Service: Vascular;  Laterality: Left;   TRANSURETHRAL RESECTION OF BLADDER TUMOR N/A 11/27/2020   Procedure: TRANSURETHRAL RESECTION OF BLADDER TUMOR (TURBT);  Surgeon: Carolee Sherwood JONETTA DOUGLAS, MD;  Location: [facility] ORS;  Service: Urology;  Laterality: N/A;  NEED PARALYSIS    Family History  Problem Relation Age of Onset   Diabetes Mother    Hypertension Mother    Heart attack Mother 35   Stroke Father    Hypertension Father    Depression Father    Diabetes Brother    Hypertension Brother    Stroke Brother 29   Alcohol abuse Brother    Asthma Brother    Colon cancer Neg Hx    Esophageal cancer Neg Hx    Stomach cancer Neg Hx     Social History   Socioeconomic History   Marital status: Single    Spouse name: Not on file   Number of children: 2   Years of education: Not on file   Highest education level: Not on file  Occupational History   Occupation: disable  Tobacco Use   Smoking status: Never    Passive exposure: Never   Smokeless tobacco: Never  Vaping Use   Vaping status: Never Used  Substance and Sexual Activity   Alcohol use: No   Drug use: No   Sexual activity: Never  Other Topics Concern   Not on file  Social History Narrative   Lives alone.    Social Drivers of Health   Tobacco Use: Low Risk (08/21/2024)   Patient History    Smoking Tobacco Use: Never    Smokeless Tobacco Use: Never    Passive Exposure: Never  Financial Resource Strain: Not on file  Food Insecurity: Food Insecurity Present (05/14/2024)   Epic    Worried About  Programme Researcher, Broadcasting/film/video in the Last Year: Often true    Ran Out of Food in the Last Year: Often true  Transportation Needs: No Transportation Needs (05/14/2024)   Epic    Lack of Transportation (Medical): No    Lack of Transportation (Non-Medical): No  Physical Activity: Not on file  Stress: Not on file  Social Connections: Not on file  Intimate Partner Violence: Not At Risk (05/14/2024)   Epic    Fear of Current or Ex-Partner: No    Emotionally Abused: No    Physically Abused: No    Sexually Abused: No  Depression (PHQ2-9): Not on file  Alcohol Screen: Not on file  Housing: Low Risk (05/14/2024)   Epic    Unable to Pay for Housing in the Last Year: No    Number of Times Moved in the Last Year: 0    Homeless in the Last Year: No  Utilities: Not At Risk (05/14/2024)   Epic    Threatened with loss of utilities: No  Health Literacy: Not on file    Code Status   Code Status: Prior   Vina Dasen, NP-C   08/22/2024, 3:41 PM --------------------------------------------------------------------------------------------------------------------------  I have taken a history, reviewed the chart and examined the patient. I performed a substantive portion of this encounter, including complete performance of at least one of the key components, in conjunction with the APP. I agree with the APP's note, impression and recommendations  59 year old male with history of  end-stage renal disease on dialysis and prostate/bladder cancer with history of radiation with chronic rectal bleeding, who had been scheduled for a colonoscopy to evaluate this this week, who was admitted with severe worsening of both rectal bleeding as well as bleeding per urethra, with associated drop in hemoglobin. Patient has been passing large amounts of dark clot without stool for the past 3 days after he took naproxen  for a fractured hand (which is the reason he was not able to complete his outpatient colonoscopy). His baseline  hemoglobin has been around 8, but dropped to 6.5 this afternoon.  He is currently seeking 1 unit PRBCs.  He has been hemodynamically stable.  Given his chronic rectal bleeding and history of radiation, I am most suspicious for radiation proctitis, but mass lesion also needs to be excluded.  The concomitant worsening of urethral bleeding at the same time does raise possibility for possible complications such as a fistula, but I think this is less likely.  Although I had originally recommended performing colonoscopy tomorrow with possibility of a suboptimal bowel prep, the patient strongly wanted to try and get as optimal bowel prep as possible so that he would not have to repeat a colonoscopy as an outpatient for colon cancer screening.  Additionally, he is due for dialysis today and would like to get dialyzed prior to procedure.  As his bleeding seems to be most likely from a diffuse mucosal process such as radiation proctitis, and less likely from an arterial source such as a diverticulum, I think it is reasonable to do his colonoscopy on Friday after a standard prep.  He can get dialyzed this evening or tomorrow pending nephrology's schedule.  Plan Okay for regular diet this evening Start clear liquid diet tomorrow with bowel prep tomorrow evening and plans for colonoscopy Friday morning Trend hemoglobin every 12 Dialysis this evening or tomorrow per nephrology   Madiline Saffran E. Stacia, MD [facility]  High complex medical decision making (this includes chart review, review of results, face-to-face time used for counseling as well as treatment plan and follow-up. The patient was provided an opportunity to ask questions and all were answered. The patient agreed with the plan and demonstrated an understanding of the instructions     "

## 2024-08-22 NOTE — ED Notes (Signed)
 Pt continues to remove cardiac monitor and walking around in the hall. Pt educated on importance of staying in bed and on the monitor. Pt continues to remove monitors.

## 2024-08-22 NOTE — ED Notes (Signed)
 CCMD contacted.

## 2024-08-22 NOTE — ED Notes (Signed)
 PT is complaining of bleeding and his hemoglobin dropping informed triage nurse.SABRA

## 2024-08-22 NOTE — H&P (Signed)
 " History and Physical    Patient: Devin Bullock FMW:[id number] DOB: 1966-04-23 DOA: 08/21/2024 DOS: the patient was seen and examined on 08/22/2024 PCP: [facility]  Patient coming from: Home  Chief Complaint:  Chief Complaint  Patient presents with   Hematuria   HPI: Devin Bullock is a 59 y.o. male with medical history significant of ESRD on HD MWF, prostate and bladder cancer s/p TURP 2022, anemia of chronic disease, paroxysmal SVT, history of spontaneous right perinephric hematoma in June 2025, who presents with presents with blood in his stools   He had been scheduled to have a colonoscopy in outpatient setting with about gastroenterology due to a history of rectal bleeding.  He had a fall and was seen in the emergency department on January 1st where x-rays revealed a mildly displaced fracture of the base of the fifth metatarsal. He reports bleeding worsened about two days ago after taking naproxen , which was prescribed for his hand fracture.  The bleeding involved the passage of clots and was not associated with pain.  He wears adult briefs and is on sure how many bowel movements he has had , but reports a lot of blood present when he is looked.  He has a history of prostate and bladder cancer and reports that he is continue to have blood clots coming out of his urethra.  He is scheduled for Transurethral Resection of a Bladder Tumor (TURBT) with possible instillation of intravesical chemotherapy on January 15th at [facility]. He has experienced similar bleeding episodes in the past, but describes this episode as less severe than previous ones.  He feels lightheaded and dizzy, particularly when standing, and is concerned about low hemoglobin levels. He has been in the emergency room where his blood pressure was monitored, and he requested hemoglobin checks due to his symptoms.  He is on a dialysis schedule of Monday, Wednesday, and Friday, and feels weak after dialysis  sessions. He is concerned about the timing of his dialysis and the preparation for the colonoscopy, preferring to be in his room during the bowel prep process.  Lightheadedness and dizziness, particularly when standing.  In the emergency department patient was noted to be afebrile with heart rates 90-1 13, respiration 18-23, and all other vital signs maintained.  Labs significant for hemoglobin 7.6-> 6.5, potassium 3.7, BUN 48, creatinine 10.5, alkaline phosphatase 142, and  INR 1.2.  Stool guaiacs were noted to be positive.  Gastroenterology had been formally consulted as well as nephrology.  Patient was typed and screened and ordered 1 unit of packed red blood cells.  Review of Systems: As mentioned in the history of present illness. All other systems reviewed and are negative. Past Medical History:  Diagnosis Date   Acute on chronic combined systolic and diastolic CHF (congestive heart failure) (HCC) 04/13/2013   Anemia    AV fistula    Cancer (HCC)    prostate and bladder cancer - 2022 diagnosed   CHF (congestive heart failure) (HCC)    patient denies   CKD (chronic kidney disease) 07/03/2014   ESRD on hemodialysis (HCC)    MWF [facility]   History of blood transfusion 03/2020   Hypertension    history of, no longer taking medication at this time   Morbid obesity [facility])    Parathyroid disorder    Past Surgical History:  Procedure Laterality Date   AV FISTULA PLACEMENT Left 05/24/2014   Procedure: BRACHIOCEPHALIC ARTERIOVENOUS (AV) FISTULA CREATION;  Surgeon: Lonni  GORMAN Blade, MD;  Location: [facility] OR;  Service: Vascular;  Laterality: Left;   AV FISTULA PLACEMENT Left 09/15/2023   Procedure: LEFT ARM ARTERIOVENOUS (AV) FISTULA REVISION AND PLICATION;  Surgeon: Gretta Lonni PARAS, MD;  Location: [facility] OR;  Service: Vascular;  Laterality: Left;   ESOPHAGOGASTRODUODENOSCOPY (EGD) WITH PROPOFOL  N/A 04/03/2020   Procedure: ESOPHAGOGASTRODUODENOSCOPY (EGD) WITH PROPOFOL ;  Surgeon: Saintclair Jasper, MD;  Location: [facility] ENDOSCOPY;  Service: Gastroenterology;  Laterality: N/A;   PROSTATE BIOPSY N/A 11/27/2020   Procedure: BIOPSY TRANSRECTAL ULTRASONIC PROSTATE (TUBP);  Surgeon: Carolee Sherwood JONETTA DOUGLAS, MD;  Location: [facility] ORS;  Service: Urology;  Laterality: N/A;  REQUESTING 50 MINS FOR CASE   REVISON OF ARTERIOVENOUS FISTULA Left 01/08/2021   Procedure: REVISON PLICATION OF ARTERIOVENOUS FISTULA LEFT;  Surgeon: Sheree Penne Lonni, MD;  Location: [facility] OR;  Service: Vascular;  Laterality: Left;   SURGICAL EXCISION OF EXCESSIVE SKIN Left 09/15/2023   Procedure: SURGICAL EXCISION OF EXCESSIVE SKIN, ULCERATIVE SKIN;  Surgeon: Gretta Lonni PARAS, MD;  Location: [facility] OR;  Service: Vascular;  Laterality: Left;   THROMBECTOMY W/ EMBOLECTOMY Left 09/15/2023   Procedure: THROMBECTOMY ARTERIOVENOUS FISTULA;  Surgeon: Gretta Lonni PARAS, MD;  Location: [facility] OR;  Service: Vascular;  Laterality: Left;   TRANSURETHRAL RESECTION OF BLADDER TUMOR N/A 11/27/2020   Procedure: TRANSURETHRAL RESECTION OF BLADDER TUMOR (TURBT);  Surgeon: Carolee Sherwood JONETTA DOUGLAS, MD;  Location: [facility] ORS;  Service: Urology;  Laterality: N/A;  NEED PARALYSIS   Social History:  reports that he has never smoked. He has never been exposed to tobacco smoke. He has never used smokeless tobacco. He reports that he does not drink alcohol and does not use drugs.  Allergies[1]  Family History  Problem Relation Age of Onset   Diabetes Mother    Hypertension Mother    Heart attack Mother 14   Stroke Father    Hypertension Father    Depression Father    Diabetes Brother    Hypertension Brother    Stroke Brother 98   Alcohol abuse Brother    Asthma Brother    Colon cancer Neg Hx    Esophageal cancer Neg Hx    Stomach cancer Neg Hx     Prior to Admission medications  Medication Sig Start Date End Date Taking? Authorizing Provider  atorvastatin  (LIPITOR) 40 MG tablet Take 1 tablet (40 mg total) by mouth daily. 05/25/23   Pearlean Manus, MD   B Complex-C-Folic Acid (RENA-VITE RX) 1 MG TABS Take 1 tablet by mouth daily. 11/21/23   [provider]  cinacalcet  (SENSIPAR ) 60 MG tablet Take 60 mg by mouth daily.    [provider]  guaifenesin  (ROBITUSSIN) 100 MG/5ML syrup Take 200 mg by mouth 3 (three) times daily as needed for cough.    [provider]  hydrocortisone  (ANUSOL -HC) 2.5 % rectal cream Place 1 Application rectally 2 (two) times daily. Patient not taking: Reported on 08/14/2024 05/25/24   Caleen Burgess BROCKS, MD  Methoxy PEG-Epoetin  Beta (MIRCERA IJ) Mircera 10/17/23 10/15/24  [provider]  metoprolol  tartrate (LOPRESSOR ) 25 MG tablet Take 12.5 mg by mouth 2 (two) times daily. 04/09/24   [provider]  midodrine  (PROAMATINE ) 5 MG tablet Take 5 mg by mouth. Taking Monday, Wednesday and Friday at dialysis 10/07/23   [provider]  naproxen  (NAPROSYN ) 500 MG tablet Take 1 tablet (500 mg total) by mouth 2 (two) times daily. 08/16/24   Logan Ubaldo NOVAK, PA-C  sevelamer  carbonate (RENVELA ) 800 MG tablet Take  2,400 mg by mouth 3 (three) times daily with meals. Take 1 tablet by mouth a snack 05/17/23   [provider]    Physical Exam: Vitals:   08/22/24 1144 08/22/24 1410 08/22/24 1715 08/22/24 1730  BP: 122/74 127/75 107/65 108/66  Pulse: 87 85 87 87  Resp: 18 18 (!) 21 20  Temp: 98.9 F (37.2 C) 98.7 F (37.1 C) (!) 96.4 F (35.8 C) (!) 96.8 F (36 C)  TempSrc:   Temporal Temporal  SpO2: 98% 97% 100% 100%  Weight:      Height:       Constitutional: Older obese male NAD, calm, comfortable Eyes: PERRL, lids and conjunctivae normal ENMT: Mucous membranes are moist. Normal dentition.  Neck: normal, supple  Respiratory: clear to auscultation bilaterally, no wheezing, no crackles. Normal respiratory effort. No accessory muscle use.  Cardiovascular: Regular rate and rhythm, no murmurs / rubs / gallops.  1+ pitting edema to bilateral lower extremity.  Fistula present of  the left forearm. Abdomen: Protuberant abdomen.  Bowel sounds positive.  Musculoskeletal: no clubbing / cyanosis. No joint deformity upper and cast.  Lower extremities.   Normal muscle tone.  Skin: no rashes, lesions, ulcers.   Neurologic: CN 2-12 grossly intact.   Strength 5/5 in all 4.  Psychiatric: Normal judgment and insight. Alert and oriented x 3. Normal mood.   Data Reviewed:   Reviewed labs, imaging, and pertinent records as documented.  Assessment and Plan:   Acute on chronic blood loss anemia secondary to GI bleed Acute.  Patient presents with complaints of blood in stool as well as hematuria.  Notes recently being prescribed naproxen  for right hand fracture with worsening blood in stool.  Stool guaiacs were noted to be positive.  It appears he was already scheduled to have colonoscopy prior to upcoming surgical procedure hemoglobin noted to drop from 7.6->6.5.  Patient was typed and screened for possible need of blood products and ordered 1 unit of packed red blood cells. - Admit to a telemetry bed - Monitor intake and output - Continue with transfusion of 1 unit of packed red blood cells - Advised patient of the need not to take naproxen  - Serial monitoring of H&H - Renal diet and clear liquid diet in a.m. - Bowel prep scheduled for tomorrow - Appreciate GI consultative services, we will follow-up for any further recommendation   History of bladder and prostate cancer Hematuria Patient with a history of prostate and bladder cancer following in the outpatient setting with urology at [facility].  Scheduled to have Transurethral Resection of a Bladder Tumor (TURBT) with possible instillation of intravesical chemotherapy on January 15th at [facility]. - Continue outpatient follow-up with [facility]  Right hand fracture Prior to admission.  Patient was found to have a mildly displaced fracture of the base of the fifth metatarsal after fall.  Currently in Cast. - Continue  cast - Hydrocodone  as needed for pain  ESRD on HD Chronic hypotension Patient is on a Monday, Wednesday, Friday schedule.  Blood pressures currently maintained he was scheduled for dialysis today but missed it due to coming into the hospital.  Labs revealed potassium 3.7, BUN 48, creatinine 10.5.  Nephrology made aware and  will likely dialyze in a.m. - Continue midodrine  with dialysis - Appreciate nephrology consultative services, will follow-up for any further recommendation  Heart failure with preserved EF Patient with lower extremity edema present on physical exam.  He is due for dialysis today.  Last echocardiogram noted  EF to be 50 to 55% with indeterminate diastolic parameters when checked 05/2023. - Fluid managed with hemodialysis  Obesity, class II BMI 36.76 kg/m  DVT prophylaxis: SCDs Advance Care Planning:   Code Status: Full Code    Consults: Gastroenterology, nephrology  Family Communication: None Severity of Illness: The appropriate patient status for this patient is INPATIENT. Inpatient status is judged to be reasonable and necessary in order to provide the required intensity of service to ensure the patient's safety. The patient's presenting symptoms, physical exam findings, and initial radiographic and laboratory data in the context of their chronic comorbidities is felt to place them at high risk for further clinical deterioration. Furthermore, it is not anticipated that the patient will be medically stable for discharge from the hospital within 2 midnights of admission.   * I certify that at the point of admission it is my clinical judgment that the patient will require inpatient hospital care spanning beyond 2 midnights from the point of admission due to high intensity of service, high risk for further deterioration and high frequency of surveillance required.*  Author: Maximino DELENA Sharps, MD 08/22/2024 5:46 PM  For on call review [email or web address].      [1] No Known  Allergies  "

## 2024-08-22 NOTE — ED Provider Notes (Addendum)
 " Physical Exam  BP 127/75 (BP Location: Right Arm)   Pulse 85   Temp 98.7 F (37.1 C)   Resp 18   Ht 6' 4 (1.93 m)   Wt (!) 137 kg   SpO2 97%   BMI 36.76 kg/m   Physical Exam Vitals and nursing note reviewed.  Constitutional:      General: He is awake. He is not in acute distress.    Appearance: Normal appearance. He is not ill-appearing, toxic-appearing or diaphoretic.  HENT:     Head: Normocephalic and atraumatic.  Eyes:     General: No scleral icterus. Cardiovascular:     Rate and Rhythm: Normal rate and regular rhythm.  Pulmonary:     Effort: Pulmonary effort is normal. No respiratory distress.     Breath sounds: No stridor. No wheezing, rhonchi or rales.  Abdominal:     General: Abdomen is flat. There is no distension.     Palpations: Abdomen is soft.     Tenderness: There is no abdominal tenderness. There is no right CVA tenderness, left CVA tenderness or guarding.  Musculoskeletal:        General: Normal range of motion.     Right lower leg: No edema.     Left lower leg: No edema.  Skin:    General: Skin is warm.     Capillary Refill: Capillary refill takes less than 2 seconds.  Neurological:     General: No focal deficit present.     Mental Status: He is alert and oriented to person, place, and time.  Psychiatric:        Mood and Affect: Mood normal.        Behavior: Behavior normal. Behavior is cooperative.     Procedures  .Critical Care  Performed by: Janetta Terrall FALCON, PA-C Authorized by: Janetta Terrall FALCON, PA-C   Critical care provider statement:    Critical care time (minutes):  30   Critical care was necessary to treat or prevent imminent or life-threatening deterioration of the following conditions:  Circulatory failure   Critical care was time spent personally by me on the following activities:  Development of treatment plan with patient or surrogate, blood draw for specimens, discussions with consultants, evaluation of patient's response to  treatment, examination of patient, interpretation of cardiac output measurements, obtaining history from patient or surrogate, ordering and performing treatments and interventions, ordering and review of laboratory studies, re-evaluation of patient's condition and review of old charts   Care discussed with: admitting provider     ED Course / MDM   Clinical Course as of 08/22/24 1604  Wed Aug 22, 2024  1530 Intermittent GI bleed, hx of bladder cancer, scheduled for colonoscopy yesterday however 1 week ago broke a finger and is on naproxen  which may be cause  [CH]  1531 Giving blood, talked with GI already Kandra Hamman), medicine admit  [CH]  [phone] Unassigned hospitalist  [CH]    Clinical Course User Index [CH] Deisy Ozbun, Terrall FALCON, PA-C   Medical Decision Making Amount and/or Complexity of Data Reviewed Labs: ordered.  Risk Prescription drug management. Decision regarding hospitalization.   Patient signed out to myself by previous EDP Bowie-Tran PA-C, please see his note for further detail.  Briefly patient was brought in via EMS from home.  Patient has a known history of bladder cancer and has been passing a small amount of blood in his urine.  However patient fell and broke his wrist approximately 1 week ago and  was prescribed naproxen  at this time.  Since then patient has had increased hematuria as well as bright red blood in his stool.  Patient is also a end-stage renal disease patient on dialysis Monday, Wednesday, Friday. Patient has been compliant with dialysis and last session was Monday.  Patient was due for dialysis today however missed dialysis due to being in the ED.  Initial blood work significant for hemoglobin of 7.6 along with other findings consistent for end-stage renal disease.  Patient vital signs are stable and he is talking in full sentences on room air.  Patient is not on a blood thinning medication.  Patient unfortunately has been in the emergency department for almost 23  hours at this point.  Due to his first hemoglobin being 7.6, 1 unit of blood products was ordered by previous EDP.  Patient was consented for blood and agrees.  Patient also unfortunately missed his colonoscopy appointment due to his recent wrist fracture.  Hemoccult positive in the ED.  Small amount of bright red blood in rectum.  At this time plan is for admission to the hospital, previous provider already consulted GI who will see patient once admitted.  Will also get in touch with nephrology as patient will need dialysis when inpatient and is due today.  Spoke with Dr. Claudene with hospitalist service who will admit, also spoke with Dr. Marlee with nephrology who will set up for patient dialysis.  At this time patient being admitted for lower GI bleed.  Repeat hemoglobin came back at 6.5, 1 unit of blood transfusing at this time.  Patient is hemodynamically stable, most likely diagnosis at this time is low GI bleed possibly due to recent Naproxen  use.        Shaianne Nucci F, PA-C 08/23/24 0120    Itzae Miralles F, PA-C 08/23/24 0138    Lenor Hollering, MD 08/23/24 1459  "

## 2024-08-23 DIAGNOSIS — D120 Benign neoplasm of cecum: Secondary | ICD-10-CM | POA: Diagnosis not present

## 2024-08-23 DIAGNOSIS — D125 Benign neoplasm of sigmoid colon: Secondary | ICD-10-CM | POA: Diagnosis not present

## 2024-08-23 DIAGNOSIS — K921 Melena: Secondary | ICD-10-CM | POA: Diagnosis not present

## 2024-08-23 DIAGNOSIS — N186 End stage renal disease: Secondary | ICD-10-CM | POA: Diagnosis not present

## 2024-08-23 DIAGNOSIS — R310 Gross hematuria: Secondary | ICD-10-CM | POA: Diagnosis not present

## 2024-08-23 DIAGNOSIS — D175 Benign lipomatous neoplasm of intra-abdominal organs: Secondary | ICD-10-CM | POA: Diagnosis not present

## 2024-08-23 DIAGNOSIS — D123 Benign neoplasm of transverse colon: Secondary | ICD-10-CM | POA: Diagnosis not present

## 2024-08-23 DIAGNOSIS — D62 Acute posthemorrhagic anemia: Secondary | ICD-10-CM | POA: Diagnosis not present

## 2024-08-23 DIAGNOSIS — D124 Benign neoplasm of descending colon: Secondary | ICD-10-CM | POA: Diagnosis not present

## 2024-08-23 DIAGNOSIS — Z992 Dependence on renal dialysis: Secondary | ICD-10-CM | POA: Diagnosis not present

## 2024-08-23 DIAGNOSIS — D122 Benign neoplasm of ascending colon: Secondary | ICD-10-CM | POA: Diagnosis not present

## 2024-08-23 DIAGNOSIS — K6389 Other specified diseases of intestine: Secondary | ICD-10-CM | POA: Diagnosis not present

## 2024-08-23 LAB — CBC
HCT: 23.3 % — ABNORMAL LOW (ref 39.0–52.0)
Hemoglobin: 7.2 g/dL — ABNORMAL LOW (ref 13.0–17.0)
MCH: 29.6 pg (ref 26.0–34.0)
MCHC: 30.9 g/dL (ref 30.0–36.0)
MCV: 95.9 fL (ref 80.0–100.0)
Platelets: 155 K/uL (ref 150–400)
RBC: 2.43 MIL/uL — ABNORMAL LOW (ref 4.22–5.81)
RDW: 17.3 % — ABNORMAL HIGH (ref 11.5–15.5)
WBC: 5.3 K/uL (ref 4.0–10.5)
nRBC: 0 % (ref 0.0–0.2)

## 2024-08-23 LAB — HEMOGLOBIN AND HEMATOCRIT, BLOOD
HCT: 22.6 % — ABNORMAL LOW (ref 39.0–52.0)
HCT: 23.9 % — ABNORMAL LOW (ref 39.0–52.0)
HCT: 25.5 % — ABNORMAL LOW (ref 39.0–52.0)
Hemoglobin: 7 g/dL — ABNORMAL LOW (ref 13.0–17.0)
Hemoglobin: 7.5 g/dL — ABNORMAL LOW (ref 13.0–17.0)
Hemoglobin: 7.9 g/dL — ABNORMAL LOW (ref 13.0–17.0)

## 2024-08-23 LAB — RENAL FUNCTION PANEL
Albumin: 3.5 g/dL (ref 3.5–5.0)
Anion gap: 14 (ref 5–15)
BUN: 59 mg/dL — ABNORMAL HIGH (ref 6–20)
CO2: 32 mmol/L (ref 22–32)
Calcium: 9.4 mg/dL (ref 8.9–10.3)
Chloride: 96 mmol/L — ABNORMAL LOW (ref 98–111)
Creatinine, Ser: 13.3 mg/dL — ABNORMAL HIGH (ref 0.61–1.24)
GFR, Estimated: 4 mL/min — ABNORMAL LOW
Glucose, Bld: 92 mg/dL (ref 70–99)
Phosphorus: 5.9 mg/dL — ABNORMAL HIGH (ref 2.5–4.6)
Potassium: 4.2 mmol/L (ref 3.5–5.1)
Sodium: 141 mmol/L (ref 135–145)

## 2024-08-23 LAB — HEPATITIS B SURFACE ANTIGEN: Hepatitis B Surface Ag: NONREACTIVE

## 2024-08-23 MED ORDER — SIMETHICONE 80 MG PO CHEW: 240.0000 mg | CHEWABLE_TABLET | Freq: Once | ORAL | Status: DC

## 2024-08-23 MED ORDER — MIDODRINE HCL 5 MG PO TABS
5.0000 mg | ORAL_TABLET | ORAL | Status: DC
  Administered 2024-08-23 – 2024-08-24 (×2): 5 mg via ORAL
  Filled 2024-08-23 (×2): qty 1

## 2024-08-23 MED ORDER — ANTICOAGULANT SODIUM CITRATE 4% (200MG/5ML) IV SOLN: 5.0000 mL | Status: DC | PRN

## 2024-08-23 MED ORDER — HEPARIN SODIUM (PORCINE) 1000 UNIT/ML DIALYSIS: 1000.0000 [IU] | INTRAMUSCULAR | Status: DC | PRN

## 2024-08-23 MED ORDER — NA SULFATE-K SULFATE-MG SULF 17.5-3.13-1.6 GM/177ML PO SOLN
0.5000 | Freq: Once | ORAL | Status: DC
  Filled 2024-08-23: qty 1

## 2024-08-23 MED ORDER — CHLORHEXIDINE GLUCONATE CLOTH 2 % EX PADS
6.0000 | MEDICATED_PAD | Freq: Every day | CUTANEOUS | Status: DC
  Administered 2024-08-24 – 2024-08-26 (×2): 6 via TOPICAL

## 2024-08-23 MED ORDER — NA SULFATE-K SULFATE-MG SULF 17.5-3.13-1.6 GM/177ML PO SOLN
0.5000 | Freq: Once | ORAL | Status: AC
  Administered 2024-08-24: 177 mL via ORAL
  Filled 2024-08-23: qty 1

## 2024-08-23 MED ORDER — SODIUM CHLORIDE 0.9 % IV SOLN: INTRAVENOUS | Status: AC

## 2024-08-23 MED ORDER — LIDOCAINE-PRILOCAINE 2.5-2.5 % EX CREA: 1.0000 | TOPICAL_CREAM | CUTANEOUS | Status: DC | PRN

## 2024-08-23 MED ORDER — PENTAFLUOROPROP-TETRAFLUOROETH EX AERO: 1.0000 | INHALATION_SPRAY | CUTANEOUS | Status: DC | PRN

## 2024-08-23 MED ORDER — NEPRO/CARBSTEADY PO LIQD: 237.0000 mL | ORAL | Status: DC | PRN

## 2024-08-23 MED ORDER — LIDOCAINE HCL (PF) 1 % IJ SOLN: 5.0000 mL | INTRAMUSCULAR | Status: DC | PRN

## 2024-08-23 MED ORDER — ALTEPLASE 2 MG IJ SOLR: 2.0000 mg | Freq: Once | INTRAMUSCULAR | Status: DC | PRN

## 2024-08-23 NOTE — ED Notes (Signed)
Pt tx to dialysis. 

## 2024-08-23 NOTE — Consult Note (Signed)
 Renal Service Consult Note [facility] Lamar JONETTA Fret, MD  Patient: Devin Bullock Date: 08/23/2024 Requesting Physician: Dr. Lue  Reason for Consult: ESRD pt w/ LGIB HPI: The patient is a 59 y.o. year-old w/ PMH as below who presented to ED yesterday w/ BRBPR. In ED pt afeb, HR 100, RR 20, BP wnl. Hb 7.6, 6.5. K+ 3.7, creat 10.  INR 1.2. GI consulted. Prbc's were ordered. Pt was admitted. We are asked to see for dialysis.    Pt seen in ED. C/o feeling lightheaded. No SOB. Feels vol overloaded though.    ROS - denies CP, no joint pain, no HA, no blurry vision, no rash, no diarrhea, no nausea/ vomiting   Past Medical History  Past Medical History:  Diagnosis Date   Acute on chronic combined systolic and diastolic CHF (congestive heart failure) (HCC) 04/13/2013   Anemia    AV fistula    Cancer (HCC)    prostate and bladder cancer - 2022 diagnosed   CHF (congestive heart failure) (HCC)    patient denies   CKD (chronic kidney disease) 07/03/2014   ESRD on hemodialysis (HCC)    MWF [facility]   History of blood transfusion 03/2020   Hypertension    history of, no longer taking medication at this time   Morbid obesity (HCC)    Parathyroid disorder    Past Surgical History  Past Surgical History:  Procedure Laterality Date   AV FISTULA PLACEMENT Left 05/24/2014   Procedure: BRACHIOCEPHALIC ARTERIOVENOUS (AV) FISTULA CREATION;  Surgeon: Lonni GORMAN Blade, MD;  Location: [facility] OR;  Service: Vascular;  Laterality: Left;   AV FISTULA PLACEMENT Left 09/15/2023   Procedure: LEFT ARM ARTERIOVENOUS (AV) FISTULA REVISION AND PLICATION;  Surgeon: Gretta Lonni PARAS, MD;  Location: [facility] OR;  Service: Vascular;  Laterality: Left;   ESOPHAGOGASTRODUODENOSCOPY (EGD) WITH PROPOFOL  N/A 04/03/2020   Procedure: ESOPHAGOGASTRODUODENOSCOPY (EGD) WITH PROPOFOL ;  Surgeon: Saintclair Jasper, MD;  Location: [facility] ENDOSCOPY;  Service: Gastroenterology;  Laterality: N/A;   PROSTATE BIOPSY N/A  11/27/2020   Procedure: BIOPSY TRANSRECTAL ULTRASONIC PROSTATE (TUBP);  Surgeon: Carolee Sherwood JONETTA DOUGLAS, MD;  Location: [facility] ORS;  Service: Urology;  Laterality: N/A;  REQUESTING 44 MINS FOR CASE   REVISON OF ARTERIOVENOUS FISTULA Left 01/08/2021   Procedure: REVISON PLICATION OF ARTERIOVENOUS FISTULA LEFT;  Surgeon: Sheree Penne Lonni, MD;  Location: [facility] OR;  Service: Vascular;  Laterality: Left;   SURGICAL EXCISION OF EXCESSIVE SKIN Left 09/15/2023   Procedure: SURGICAL EXCISION OF EXCESSIVE SKIN, ULCERATIVE SKIN;  Surgeon: Gretta Lonni PARAS, MD;  Location: [facility] OR;  Service: Vascular;  Laterality: Left;   THROMBECTOMY W/ EMBOLECTOMY Left 09/15/2023   Procedure: THROMBECTOMY ARTERIOVENOUS FISTULA;  Surgeon: Gretta Lonni PARAS, MD;  Location: [facility] OR;  Service: Vascular;  Laterality: Left;   TRANSURETHRAL RESECTION OF BLADDER TUMOR N/A 11/27/2020   Procedure: TRANSURETHRAL RESECTION OF BLADDER TUMOR (TURBT);  Surgeon: Carolee Sherwood JONETTA DOUGLAS, MD;  Location: [facility] ORS;  Service: Urology;  Laterality: N/A;  NEED PARALYSIS   Family History  Family History  Problem Relation Age of Onset   Diabetes Mother    Hypertension Mother    Heart attack Mother 33   Stroke Father    Hypertension Father    Depression Father    Diabetes Brother    Hypertension Brother    Stroke Brother 98   Alcohol abuse Brother    Asthma Brother    Colon cancer Neg Hx    Esophageal cancer Neg Hx  Stomach cancer Neg Hx    Social History  reports that he has never smoked. He has never been exposed to tobacco smoke. He has never used smokeless tobacco. He reports that he does not drink alcohol and does not use drugs. Allergies Allergies[1] Home medications Prior to Admission medications  Medication Sig Start Date End Date Taking? Authorizing Provider  atorvastatin  (LIPITOR) 40 MG tablet Take 1 tablet (40 mg total) by mouth daily. 05/25/23  Yes Emokpae, Courage, MD  cinacalcet  (SENSIPAR ) 60 MG tablet Take 60 mg by mouth daily.    Yes [provider]  metoprolol  tartrate (LOPRESSOR ) 25 MG tablet Take 12.5 mg by mouth in the morning. 04/09/24  Yes [provider]  midodrine  (PROAMATINE ) 10 MG tablet Take 10 mg by mouth every Monday, Wednesday, and Friday. Take prior to going to dialysis.  If BP is high, do not take medication. 08/17/24  Yes [provider]  sevelamer  carbonate (RENVELA ) 800 MG tablet Take 2,400 mg by mouth 3 (three) times daily with meals. Take 1 tablet by mouth a snack 05/17/23  Yes [provider]     Vitals:   08/23/24 0400 08/23/24 0500 08/23/24 0600 08/23/24 0623  BP: 124/85 113/74 110/75   Pulse: 89 83 79   Resp: (!) 21 18 (!) 22   Temp:    97.8 F (36.6 C)  TempSrc:    Oral  SpO2: 98% 99% 96%   Weight:      Height:       Exam Gen alert, no distress Sclera anicteric, throat clear  No jvd or bruits Chest clear bilat to bases RRR no MRG Abd soft ntnd no mass or ascites +bs Ext 1+ bilat LE edema, no other edema Neuro is alert, Ox 3 , nf    LUA AVF+bruit    Home bp meds: Metoprolol   Midodrine    OP HD: [facility] 4.5h  B450   135kg   2K bath  AVF  Hep none Last OP HD 1/05, post wt 136.4 Usual target UF 2.5- 4.5kg    Assessment/ Plan:  # ESRD - on HD MWF, missed HD yest (in ED) - plan HD today   # BP - takes metoprolol   - takes midodrine  pre HD 10mg     # Volume - moderate vol excess, not in distress - max UF w/ HD  #  GI bleed - w/ BRBPR - colonoscopy planned for Friday  # Anemia of esrd - Hb 7-8 here, sp 1 u prbc's - transfuse prn       Myer Fret  MD [facility] 08/23/2024, 8:10 AM  Recent Labs  Lab 08/21/24 1745 08/22/24 1605 08/22/24 2331 08/23/24 0340  HGB 7.6*   < > 7.0* 7.2*  ALBUMIN  3.9  --   --  3.5  CALCIUM  9.7  --   --  9.4  PHOS  --   --   --  5.9*  CREATININE 10.50*  --   --  13.30*  K 3.7  --   --  4.2   < > = values in this interval not displayed.   Inpatient medications:  atorvastatin   40 mg Oral Daily    cinacalcet   60 mg Oral Q breakfast   hydrocortisone   1 Application Rectal BID   [START ON 08/24/2024] midodrine   5 mg Oral Q M,W,F-HD   sodium chloride  flush  3 mL Intravenous Q12H    sodium chloride      acetaminophen  **OR** acetaminophen , HYDROcodone -acetaminophen       [  1]  Allergies Allergen Reactions   Nsaids Other (See Comments)    Patient has HX of GI bleed, and medications aggravate condition.

## 2024-08-23 NOTE — ED Notes (Signed)
Floor notified.

## 2024-08-23 NOTE — Progress Notes (Signed)
" °   08/23/24 1526  Vitals  Temp 97.6 F (36.4 C)  Temp Source Oral  BP 117/65  BP Location Right Arm  BP Method Automatic  Patient Position (if appropriate) Lying  Pulse Rate 86  Pulse Rate Source Monitor  ECG Heart Rate 85  Resp 17  Weight (!) 137.8 kg  Type of Weight Post-Dialysis  Oxygen Therapy  SpO2 100 %  O2 Device Room Air  Patient Activity (if Appropriate) In bed  Pulse Oximetry Type Continuous  Oximetry Probe Site Changed No  During Treatment Monitoring  Blood Flow Rate (mL/min) 399 mL/min  Arterial Pressure (mmHg) -14.54 mmHg  Venous Pressure (mmHg) 212.71 mmHg  TMP (mmHg) 6.87 mmHg  Ultrafiltration Rate (mL/min) 0 mL/min  Dialysate Flow Rate (mL/min) 300 ml/min  Dialysate Potassium Concentration 3  Dialysate Calcium  Concentration 2.5  Duration of HD Treatment -hour(s) 3.43 hour(s)  Cumulative Fluid Removed (mL) per Treatment  3037.05  HD Safety Checks Performed Yes  Intra-Hemodialysis Comments Tolerated well;Tx completed  Post Treatment  Dialyzer Clearance Clear  Liters Processed 84  Fluid Removed (mL) 3000 mL  Tolerated HD Treatment Yes  AVG/AVF Arterial Site Held (minutes) 8 minutes  AVG/AVF Venous Site Held (minutes) 8 minutes    "

## 2024-08-23 NOTE — Progress Notes (Signed)
 Pt receives out-pt HD at [facility] on MWF 10:55 am chair time. Will assist as needed.   Randine Mungo Dialysis Navigator [phone]

## 2024-08-23 NOTE — Procedures (Signed)
 S: seen in HD unit, no c/o's at this time  Vitals:   08/23/24 1130 08/23/24 1151 08/23/24 1229 08/23/24 1239  BP: 129/75 115/63 118/60 123/71  Pulse: 80 74 73 74  Resp: 17 20 19 20   Temp: (!) 97.5 F (36.4 C)     TempSrc: Oral     SpO2: 100% 96% 96% 96%  Weight: (!) 140.5 kg     Height:        Recent Labs  Lab 08/21/24 1745 08/22/24 1605 08/23/24 0340 08/23/24 1122  HGB 7.6*   < > 7.2* 7.0*  ALBUMIN  3.9  --  3.5  --   CALCIUM  9.7  --  9.4  --   PHOS  --   --  5.9*  --   CREATININE 10.50*  --  13.30*  --   K 3.7  --  4.2  --    < > = values in this interval not displayed.    Inpatient medications:  atorvastatin   40 mg Oral Daily   Chlorhexidine  Gluconate Cloth  6 each Topical Q0600   cinacalcet   60 mg Oral Q breakfast   hydrocortisone   1 Application Rectal BID   midodrine   5 mg Oral Q M,W,F-HD   sodium chloride  flush  3 mL Intravenous Q12H    sodium chloride      acetaminophen  **OR** acetaminophen , HYDROcodone -acetaminophen   I was present at the procedure, reviewed the HD regimen and made appropriate changes.   Myer Fret MD  [facility] 08/23/2024, 1:52 PM

## 2024-08-23 NOTE — Progress Notes (Signed)
 Patient arrived to unit with a brief on. Patient refused to let RN remove the brief. Patient educated on pressure injury prevention. Patient understands teaching from RN.

## 2024-08-23 NOTE — Progress Notes (Signed)
 " PROGRESS NOTE    Devin Bullock  FMW:[id number] DOB: 10-01-1965 DOA: 08/21/2024 PCP: [facility]   Brief Narrative:  STEPHONE Bullock is a 59 y.o. male with medical history significant of ESRD on HD MWF, prostate and bladder cancer s/p TURP 2022, anemia of chronic disease, paroxysmal SVT, history of spontaneous right perinephric hematoma in June 2025, who presents with presents with blood in his stools as well as ongoing hematuria.  Patient recently placed on naproxen  for fracture with subsequent episodes of bloody stool and clots passed via rectum over the past 2 days.  Patient also notes a single episode of blood clot passed with urine complicated by his known prostate and bladder cancer. Of note patient is scheduled for Transurethral Resection of a Bladder Tumor (TURBT) with possible instillation of intravesical chemotherapy on January 15th at [facility]. He has experienced similar episodes in the past, but describes this episode as less severe than previous ones in regards to hematuria.  Given his subsequent drop in hemoglobin from baseline of 8/9 and need for ongoing transfusion hospitalist was called for admission and GI was called in consult. (Of note patient had scheduled colonoscopy with [facility] next week)  Assessment & Plan:   Active Problems:   Acute blood loss anemia   GI bleed   Gross hematuria   History of bladder cancer   Right hand fracture   ESRD (end stage renal disease) on dialysis (HCC)   Heart failure with preserved ejection fraction (HCC)   Obesity   Lower GI bleed   Acute on chronic blood loss anemia secondary to GI bleed -Unclear if upper versus lower bleed, GI consulted -appreciate insight recommendations - Complicated by recent NSAID use - 1 unit PRBC transfused overnight, repeat labs stable above 7 - Acute symptoms of weakness and fatigue appear to have improved with transfusion - Patient requesting advancement of diet which we discussed was  not ideal given his need for endoscopy.  Unclear if patient will be agreeable for endoscopy at this point given his diet remains clear liquids.    Bladder and prostate cancer Acute recurrent hematuria - Currently following at [facility]  - Plan for TURBT 1/15 - Patient reports repeat episode of passing blood clots with urine, this is a recurrent issue, appears to be stabilizing if not resolving overnight   Right hand fracture, POA Currently in a cast Discontinue NSAIDs given above   ESRD on HD MWF Chronic hypotension -Currently volume overloaded, due for dialysis this morning, likely to undergo HD later today per nephrology -appreciate insight recommendations - Continue midodrine  with dialysis   Heart failure with preserved EF -Volume management with dialysis as above, volume overloaded given missed dialysis this morning  Prior echo 2024 -EF to be 50 to 55% with indeterminate diastolic parameter   Obesity, class II BMI 36.76 kg/m  DVT prophylaxis: SCDs Start: 08/22/24 1830 -no chemical prophylaxis given above Code Status:   Code Status: Full Code  Family Communication: None present  Status is: Inpatient  Dispo: The patient is from: Home              Anticipated d/c is to: Home              Anticipated d/c date is: To be determined              Patient currently not medically stable for discharge  Consultants:  GI, nephrology  Procedures:  HD per nephrology schedule Potential endoscopy upper  versus lower with GI pending schedule  Antimicrobials:  None indicated  Subjective: No acute issues or events overnight denies nausea vomiting diarrhea constipation headache fevers chills chest pain.  He does note ongoing painless black to red blood clots passed via rectum.  He also notes painless blood clot being passed via penis today.  Objective: Vitals:   08/23/24 0400 08/23/24 0500 08/23/24 0600 08/23/24 0623  BP: 124/85 113/74 110/75   Pulse: 89 83 79   Resp: (!) 21  18 (!) 22   Temp:    97.8 F (36.6 C)  TempSrc:    Oral  SpO2: 98% 99% 96%   Weight:      Height:        Intake/Output Summary (Last 24 hours) at 08/23/2024 0734 Last data filed at 08/22/2024 2021 Gross per 24 hour  Intake 437.67 ml  Output --  Net 437.67 ml   Filed Weights   08/21/24 1730  Weight: (!) 137 kg    Examination:  General:  Pleasantly resting in bed, No acute distress. HEENT:  Normocephalic atraumatic.  Sclerae nonicteric, noninjected.  Extraocular movements intact bilaterally. Neck:  Without mass or deformity.  Trachea is midline. Lungs:  Clear to auscultate bilaterally without rhonchi, wheeze, or rales. Heart:  Regular rate and rhythm.  Without murmurs, rubs, or gallops. Abdomen:  Soft, nontender, nondistended.  Without guarding or rebound. Extremities: Without cyanosis, clubbing, 2+ edema bilateral lower extremities, right hand currently casted Skin:  Warm and dry, no erythema.  Data Reviewed: I have personally reviewed following labs and imaging studies  CBC: Recent Labs  Lab 08/21/24 1745 08/22/24 1605 08/22/24 2331 08/23/24 0340  WBC 5.7 4.6  --  5.3  NEUTROABS 3.6 3.2  --   --   HGB 7.6* 6.5* 7.0* 7.2*  HCT 25.0* 21.7* 22.6* 23.3*  MCV 96.9 96.0  --  95.9  PLT 174 165  --  155   Basic Metabolic Panel: Recent Labs  Lab 08/21/24 1745 08/23/24 0340  NA 141 141  K 3.7 4.2  CL 95* 96*  CO2 33* 32  GLUCOSE 138* 92  BUN 48* 59*  CREATININE 10.50* 13.30*  CALCIUM  9.7 9.4  PHOS  --  5.9*   GFR: Estimated Creatinine Clearance: 8.9 mL/min (A) (by C-G formula based on SCr of 13.3 mg/dL (H)). Liver Function Tests: Recent Labs  Lab 08/21/24 1745 08/23/24 0340  AST 29  --   ALT 13  --   ALKPHOS 142*  --   BILITOT 0.3  --   PROT 7.0  --   ALBUMIN  3.9 3.5   No results for input(s): LIPASE, AMYLASE in the last 168 hours. No results for input(s): AMMONIA in the last 168 hours. Coagulation Profile: Recent Labs  Lab 08/21/24 1745  INR  1.2   No results found for this or any previous visit (from the past 240 hours).   Radiology Studies: No results found.  Scheduled Meds:  atorvastatin   40 mg Oral Daily   cinacalcet   60 mg Oral Q breakfast   hydrocortisone   1 Application Rectal BID   [START ON 08/24/2024] midodrine   5 mg Oral Q M,W,F-HD   sodium chloride  flush  3 mL Intravenous Q12H   Continuous Infusions:  sodium chloride        LOS: 1 day   Time spent:  Elsie JAYSON Montclair, DO [facility]  If 7PM-7AM, please contact night-coverage [email or web address]  08/23/2024, 7:34 AM      "

## 2024-08-23 NOTE — Progress Notes (Signed)
 [facility] RN for [employer] [phone]

## 2024-08-23 NOTE — Progress Notes (Addendum)
 "    Daily Progress Note  DOA: 08/21/2024 Hospital Day: 3  Cc: hematochezia  Assessment and Plan:   59 year old male with a history of prostate / bladder cancer s/p TURBT 2022 who has been having gross hematuria .Scheduled for Transurethral Resection of a Bladder Tumor (TURBT) with possible instillation of intravesical chemotherapy on January 15th at [facility]. He has also been having intermittent pain rectal bleeding with bowel movements for months. Now admitted with large-volume painless hematochezia ( different issue vrs worsening of same issue?).  Was scheduled for colonoscopy 08/21/24 with us  for evaluation but canceled following a fall in which he broke some of his fingers.  Rule out diverticular hemorrhage.  Patient has had radiation for prostate cancer though bleeding seems much more voluminous  than what would be expected with radiation proctitis.  Bleeding out of proportion it seems for hemorrhoids as well.    Plan is to prep to today ( has been on clears) for colonoscopy tomorrow.   The risks and benefits of colonoscopy were already discussed yesterday.and the patient agrees to proceed.  Patient still asking for food as he was yesterday. Again I explained why he couldn't have solids right now.   Acute on chronic anemia in setting of hematochezia and gross hematuria.   Hgb apparently ~ 9 at dialysis a few days ago, down to 6.5 this admission.   Minimal response to a unit of RBCs yesterday, Hgb 6.5 >> 7. Reports having had only one episode of bleeding this am.  Monitor H/H Transfuse as needed   History of spontaneous right perinephric hematoma June 2025  ESRD on HD MWF    HFpEF 55%   See PMH for any additional medical history  / medical problems   Active Problems:   ESRD (end stage renal disease) on dialysis (HCC)   Acute blood loss anemia   Lower GI bleed   Gross hematuria   GI bleed   History of bladder cancer   Right hand fracture   Heart failure with  preserved ejection fraction (HCC)   Obesity   Subjective Complaining of hunger. Asking for food. Passed a small amount of blood from rectum this am.   Objective   Recent Labs    08/21/24 1745 08/22/24 1605 08/22/24 2331 08/23/24 0340 08/23/24 1122  WBC 5.7 4.6  --  5.3  --   HGB 7.6* 6.5* 7.0* 7.2* 7.0*  HCT 25.0* 21.7* 22.6* 23.3* 22.6*  MCV 96.9 96.0  --  95.9  --   PLT 174 165  --  155  --    No results for input(s): FOLATE, VITAMINB12, FERRITIN, TIBC, IRONPCTSAT in the last 72 hours. Recent Labs    08/21/24 1745 08/23/24 0340  NA 141 141  K 3.7 4.2  CL 95* 96*  CO2 33* 32  GLUCOSE 138* 92  BUN 48* 59*  CREATININE 10.50* 13.30*  CALCIUM  9.7 9.4   Recent Labs    08/21/24 1745 08/23/24 0340  PROT 7.0  --   ALBUMIN  3.9 3.5  AST 29  --   ALT 13  --   ALKPHOS 142*  --   BILITOT 0.3  --       Scheduled inpatient medications:   atorvastatin   40 mg Oral Daily   Chlorhexidine  Gluconate Cloth  6 each Topical Q0600   cinacalcet   60 mg Oral Q breakfast   hydrocortisone   1 Application Rectal BID   midodrine   5 mg Oral Q M,W,F-HD   Na Sulfate-K  Sulfate-Mg Sulfate concentrate  0.5 kit Oral Once   Followed by   Na Sulfate-K Sulfate-Mg Sulfate concentrate  0.5 kit Oral Once   simethicone   240 mg Oral Once   Followed by   simethicone   240 mg Oral Once   sodium chloride  flush  3 mL Intravenous Q12H   Continuous inpatient infusions:   sodium chloride      sodium chloride      anticoagulant sodium citrate      PRN inpatient medications: acetaminophen  **OR** acetaminophen , alteplase , anticoagulant sodium citrate , feeding supplement (NEPRO CARB STEADY), heparin , HYDROcodone -acetaminophen , lidocaine  (PF), lidocaine -prilocaine , pentafluoroprop-tetrafluoroeth  Vital signs in last 24 hours: Temp:  [96.4 F (35.8 C)-98.4 F (36.9 C)] 97.5 F (36.4 C) (01/08 1130) Pulse Rate:  [73-94] 76 (01/08 1500) Resp:  [11-26] 16 (01/08 1500) BP: (93-143)/(59-87) 93/59  (01/08 1500) SpO2:  [95 %-100 %] 99 % (01/08 1500) Weight:  [140.5 kg] 140.5 kg (01/08 1130)    Intake/Output Summary (Last 24 hours) at 08/23/2024 1527 Last data filed at 08/22/2024 2021 Gross per 24 hour  Intake 437.67 ml  Output --  Net 437.67 ml    Intake/Output from previous day: 01/07 0701 - 01/08 0700 In: 437.7 [I.V.:31; Blood:406.7] Out: -  Intake/Output this shift: No intake/output data recorded.   Physical Exam:  General: Alert male in NAD undergoing dialysis Heart:  Regular rate and rhythm.  Pulmonary: Normal respiratory effort Abdomen: Soft, nondistended, nontender. Normal bowel sounds. Extremities: No lower extremity edema  Neurologic: Alert and oriented Psych: Cooperative     LOS: 1 day   Devin Bullock ,NP 08/23/2024, 3:27 PM   -----------------------------------------------------------------------------------------------  I have taken a history, reviewed the chart and examined the patient. I performed a substantive portion of this encounter, including complete performance of at least one of the key components, in conjunction with the APP. I agree with the APP's note, impression and recommendations  Patient's bleeding has slowed down quite a bit, reporting only 1 episode of passing blood.  He states that he cannot go to the bathroom unless he eats.  He has been very vocal about getting food throughout the day, and he spent quite a bit of time lamenting to me about all of the problems that he has been through through the medical process from breaking his hand, and then blaming the doctor on giving him naproxen  for making his bleeding worse and then the long wait in the ER before being seen, followed by not getting dialysis when he was supposed to, not getting a hospital bed until this afternoon and then not letting him eat.  He was also complained because we did not do a CT scan on him, which he says is the best way to evaluate the blood in his rectum, and that is  what they did last time.  I explained to him that last night we had recommended that he proceed with a colonoscopy today with a bowel prep last night, but he did not want to do that.  At his request, we delayed the colonoscopy for a day.   He is now requesting that we delay the colonoscopy again, because he needs to eat We are planning on a bowel prep this evening, but the patient is adamant that he eats.  Based on the appearance of the blood and clot he was admitted with, as well the fact that the patient has and his chronic rectal bleeding, I think it is almost certain that his current bleeding is coming from a distal colonic  or rectal source.  Although he does need a complete colonoscopy because he has never had one, I think performing a flexible sigmoidoscopy will be adequate to evaluate his current bleeding source.  He can attempt a full colonoscopy as an outpatient at a later date.  The patient has refused his bowel prep this evening per nursing.  Will attempt to do flexible sigmoidoscopy tomorrow, but I anticipate the patient will not be compliant with the enemas required beforehand.  Ok for regular diet this evening, keep NPO after midnight   Verlin Uher E. Stacia, MD [facility]   Moderate complex medical decision making (this includes chart review, review of results, face-to-face time used for counseling as well as treatment plan and follow-up. The patient was provided an opportunity to ask questions and all were answered. The patient agreed with the plan and demonstrated an understanding of the instructions    "

## 2024-08-24 ENCOUNTER — Encounter (HOSPITAL_COMMUNITY): Admission: EM | Disposition: A | Payer: Self-pay | Source: Home / Self Care | Attending: Internal Medicine

## 2024-08-24 DIAGNOSIS — D62 Acute posthemorrhagic anemia: Secondary | ICD-10-CM | POA: Diagnosis not present

## 2024-08-24 DIAGNOSIS — K921 Melena: Secondary | ICD-10-CM | POA: Diagnosis not present

## 2024-08-24 DIAGNOSIS — Z992 Dependence on renal dialysis: Secondary | ICD-10-CM | POA: Diagnosis not present

## 2024-08-24 DIAGNOSIS — N186 End stage renal disease: Secondary | ICD-10-CM | POA: Diagnosis not present

## 2024-08-24 LAB — RENAL FUNCTION PANEL
Albumin: 3.5 g/dL (ref 3.5–5.0)
Anion gap: 10 (ref 5–15)
BUN: 33 mg/dL — ABNORMAL HIGH (ref 6–20)
CO2: 33 mmol/L — ABNORMAL HIGH (ref 22–32)
Calcium: 9.4 mg/dL (ref 8.9–10.3)
Chloride: 94 mmol/L — ABNORMAL LOW (ref 98–111)
Creatinine, Ser: 8.67 mg/dL — ABNORMAL HIGH (ref 0.61–1.24)
GFR, Estimated: 6 mL/min — ABNORMAL LOW
Glucose, Bld: 80 mg/dL (ref 70–99)
Phosphorus: 4.8 mg/dL — ABNORMAL HIGH (ref 2.5–4.6)
Potassium: 4.1 mmol/L (ref 3.5–5.1)
Sodium: 137 mmol/L (ref 135–145)

## 2024-08-24 LAB — HEMOGLOBIN AND HEMATOCRIT, BLOOD
HCT: 23.7 % — ABNORMAL LOW (ref 39.0–52.0)
Hemoglobin: 7.4 g/dL — ABNORMAL LOW (ref 13.0–17.0)

## 2024-08-24 LAB — HEPATITIS B SURFACE ANTIBODY, QUANTITATIVE: Hep B S AB Quant (Post): 2575 m[IU]/mL

## 2024-08-24 LAB — PREPARE RBC (CROSSMATCH)

## 2024-08-24 SURGERY — COLONOSCOPY
Anesthesia: Monitor Anesthesia Care

## 2024-08-24 MED ORDER — SODIUM CHLORIDE 0.9% IV SOLUTION: Freq: Once | INTRAVENOUS | Status: AC

## 2024-08-24 NOTE — Progress Notes (Signed)
 " PROGRESS NOTE    Devin Bullock  FMW:[id number] DOB: 14-Nov-1965 DOA: 08/21/2024 PCP: [facility]   Brief Narrative:  Devin Bullock is a 59 y.o. male with medical history significant of ESRD on HD MWF, prostate and bladder cancer s/p TURP 2022, anemia of chronic disease, paroxysmal SVT, history of spontaneous right perinephric hematoma in June 2025, who presents with presents with blood in his stools as well as ongoing hematuria.  Patient recently placed on naproxen  for fracture with subsequent episodes of bloody stool and clots passed via rectum over the past 2 days.  Patient also notes a single episode of blood clot passed with urine complicated by his known prostate and bladder cancer. Of note patient is scheduled for Transurethral Resection of a Bladder Tumor (TURBT) with possible instillation of intravesical chemotherapy on January 15th at [facility]. He has experienced similar episodes in the past, but describes this episode as less severe than previous ones in regards to hematuria.  Given his subsequent drop in hemoglobin from baseline of 8/9 and need for ongoing transfusion hospitalist was called for admission and GI was called in consult. (Of note patient had scheduled colonoscopy with [facility] next week)  Assessment & Plan:   Active Problems:   Acute blood loss anemia   GI bleed   Gross hematuria   History of bladder cancer   Right hand fracture   ESRD (end stage renal disease) on dialysis (HCC)   Heart failure with preserved ejection fraction (HCC)   Obesity   Lower GI bleed   Acute on chronic blood loss anemia secondary to GI bleed -Reports hematochezia, most likely lower GI bleed. - Plan for colonoscopy, hopefully by tomorrow, patient did not want to be n.p.o. for last couple days. - She is on unit PRBC, hemoglobin remains low as well today, will receive another unit. . - Clear liquid diet, n.p.o. after midnight, and bowel prep today.    Bladder and  prostate cancer Acute recurrent hematuria - Currently following at [facility]  - Plan for TURBT 1/15   Right hand fracture, POA Currently in a cast Discontinue NSAIDs given above   ESRD on HD MWF Chronic hypotension -Currently volume overloaded, due for dialysis this morning, likely to undergo HD later today per nephrology -appreciate insight recommendations - Continue midodrine  with dialysis -Plan for short HD today as planned for colonoscopy tomorrow   Heart failure with preserved EF -Volume management with dialysis as above Prior echo 2024 -EF to be 50 to 55% with indeterminate diastolic parameter   Obesity, class II BMI 36.76 kg/m  DVT prophylaxis: SCDs Start: 08/22/24 1830 -no chemical prophylaxis given above Code Status:   Code Status: Full Code  Family Communication: None present  Status is: Inpatient  Dispo: The patient is from: Home              Anticipated d/c is to: Home              Anticipated d/c date is: To be determined              Patient currently not medically stable for discharge  Consultants:  GI, nephrology  Procedures:  HD per nephrology schedule Potential endoscopy upper versus lower with GI pending schedule  Antimicrobials:  None indicated  Subjective:  No shortness of breath, no chest pain  Objective: Vitals:   08/23/24 2356 08/24/24 0300 08/24/24 0941 08/24/24 0956  BP:  105/74 116/76 119/79  Pulse:  77 85  76  Resp:  20 20 15   Temp:  98.4 F (36.9 C) 98.2 F (36.8 C) 98.3 F (36.8 C)  TempSrc: Oral Oral Oral   SpO2:  96% 95% 98%  Weight:    (!) 137.8 kg  Height:    6' 4 (1.93 m)    Intake/Output Summary (Last 24 hours) at 08/24/2024 1138 Last data filed at 08/24/2024 0941 Gross per 24 hour  Intake 240 ml  Output 3000 ml  Net -2760 ml   Filed Weights   08/23/24 1130 08/23/24 1526 08/24/24 0956  Weight: (!) 140.5 kg (!) 137.8 kg (!) 137.8 kg    Examination:  Awake Alert, Oriented X 3, No new F.N deficits, Normal  affect Diminished air entry at the bases RRR +ve B.Sounds, Abd Soft No Cyanosis, Clubbing , + lower extremity edema, right wrist in a cast   Data Reviewed: I have personally reviewed following labs and imaging studies  CBC: Recent Labs  Lab 08/21/24 1745 08/22/24 1605 08/22/24 2331 08/23/24 0340 08/23/24 1122 08/23/24 1849 08/23/24 2110 08/24/24 0330  WBC 5.7 4.6  --  5.3  --   --   --   --   NEUTROABS 3.6 3.2  --   --   --   --   --   --   HGB 7.6* 6.5*   < > 7.2* 7.0* 7.5* 7.9* 7.4*  HCT 25.0* 21.7*   < > 23.3* 22.6* 23.9* 25.5* 23.7*  MCV 96.9 96.0  --  95.9  --   --   --   --   PLT 174 165  --  155  --   --   --   --    < > = values in this interval not displayed.   Basic Metabolic Panel: Recent Labs  Lab 08/21/24 1745 08/23/24 0340 08/24/24 0330  NA 141 141 137  K 3.7 4.2 4.1  CL 95* 96* 94*  CO2 33* 32 33*  GLUCOSE 138* 92 80  BUN 48* 59* 33*  CREATININE 10.50* 13.30* 8.67*  CALCIUM  9.7 9.4 9.4  PHOS  --  5.9* 4.8*   GFR: Estimated Creatinine Clearance: 13.7 mL/min (A) (by C-G formula based on SCr of 8.67 mg/dL (H)). Liver Function Tests: Recent Labs  Lab 08/21/24 1745 08/23/24 0340 08/24/24 0330  AST 29  --   --   ALT 13  --   --   ALKPHOS 142*  --   --   BILITOT 0.3  --   --   PROT 7.0  --   --   ALBUMIN  3.9 3.5 3.5   No results for input(s): LIPASE, AMYLASE in the last 168 hours. No results for input(s): AMMONIA in the last 168 hours. Coagulation Profile: Recent Labs  Lab 08/21/24 1745  INR 1.2   No results found for this or any previous visit (from the past 240 hours).   Radiology Studies: No results found.  Scheduled Meds:  atorvastatin   40 mg Oral Daily   Chlorhexidine  Gluconate Cloth  6 each Topical Q0600   cinacalcet   60 mg Oral Q breakfast   hydrocortisone   1 Application Rectal BID   midodrine   5 mg Oral Q M,W,F-HD   Na Sulfate-K Sulfate-Mg Sulfate concentrate  0.5 kit Oral Once   Followed by   Na Sulfate-K  Sulfate-Mg Sulfate concentrate  0.5 kit Oral Once   simethicone   240 mg Oral Once   Followed by   simethicone   240 mg Oral Once  sodium chloride  flush  3 mL Intravenous Q12H   Continuous Infusions:  sodium chloride        LOS: 2 days   Time spent:  Helvi Royals, DO [facility]  If 7PM-7AM, please contact night-coverage [email or web address]  08/24/2024, 11:38 AM      "

## 2024-08-24 NOTE — H&P (View-Only) (Signed)
 [facility] ROUNDING NOTE   Subjective: Patient's bleeding has slowed down significantly.   Hgb stable since transfusion.  He refused to do his bowel prep last night and stated he was not going to do any procedures overnight to his nurse.  He continues to be very difficult with adhering to the recommendations and plans made by his care team and he seems very distrustful of the medical system in general.   Objective: Vital signs in last 24 hours: Temp:  [97.6 F (36.4 C)-98.4 F (36.9 C)] 98.3 F (36.8 C) (01/09 1230) Pulse Rate:  [69-90] 69 (01/09 1230) Resp:  [15-22] 20 (01/09 1230) BP: (93-121)/(56-79) 112/62 (01/09 1230) SpO2:  [95 %-100 %] 98 % (01/09 0956) Weight:  [137.8 kg] 137.8 kg (01/09 0956) Last BM Date : 08/23/24 General: NAD, obese African American male Lungs:  CTA b/l, no w/r/r Heart:  RRR, no m/r/g Abdomen:  Soft, NT, ND, +BS Ext:  No c/c/e    Intake/Output from previous day: 01/08 0701 - 01/09 0700 In: -  Out: 3000  Intake/Output this shift: Total I/O In: 626.8 [P.O.:300; Blood:326.8] Out: 0    Lab Results: Recent Labs    08/21/24 1745 08/22/24 1605 08/22/24 2331 08/23/24 0340 08/23/24 1122 08/23/24 1849 08/23/24 2110 08/24/24 0330  WBC 5.7 4.6  --  5.3  --   --   --   --   HGB 7.6* 6.5*   < > 7.2*   < > 7.5* 7.9* 7.4*  PLT 174 165  --  155  --   --   --   --   MCV 96.9 96.0  --  95.9  --   --   --   --    < > = values in this interval not displayed.   BMET Recent Labs    08/21/24 1745 08/23/24 0340 08/24/24 0330  NA 141 141 137  K 3.7 4.2 4.1  CL 95* 96* 94*  CO2 33* 32 33*  GLUCOSE 138* 92 80  BUN 48* 59* 33*  CREATININE 10.50* 13.30* 8.67*  CALCIUM  9.7 9.4 9.4   LFT Recent Labs    08/21/24 1745 08/23/24 0340 08/24/24 0330  PROT 7.0  --   --   ALBUMIN  3.9 3.5 3.5  AST 29  --   --   ALT 13  --   --   ALKPHOS 142*  --   --   BILITOT 0.3  --   --    PT/INR Recent Labs    08/21/24 1745  INR 1.2       Imaging/Other results: No results found.    Assessment and Plan:  59 year old male with history of prostate/bladder cancer status post TURBT 2022 with chronic history of intermittent mild gross hematuria and hematochezia, who was admitted with much more severe hematuria and rectal bleeding with passage of large clots with decline in hemoglobin.  This excessive bleeding occurred shortly after taking naproxen  for a hand fracture.  Hemodynamically stable. He has been scheduled for a outpatient colonoscopy on numerous occasions, but has never been able to complete the procedures for 1 reason or another.  The day he was admitted, we recommended rapid prep and a colonoscopy the next day, but he was adamantly opposed to this, and requested to put off the colonoscopy a day to make sure that his bowel prep was adequate so that he would not have to potentially repeat a colonoscopy as an outpatient.  We were wanting to do a more  expedited colonoscopy because of the severity of his bleeding, but we acquiesced.  His bleeding did slow down thankfully, and then when we plan to prep him yesterday for colonoscopy today, he became upset because we would not let him eat.  Then when I offered to let him eat last night and plan to do a flexible sigmoidoscopy to specifically evaluate the source of bleeding, he was upset because we were not going to examine his whole colon.  He has been very difficult to manage and is disgruntled with what ever decision that I make.  After extensive discussion today (over 30 minutes), he did finally agree to proceed with a colonoscopy for tomorrow.  He understands he needs to be on a clear liquid diet all day today and drink a gallon of foul tasting fluid that will make him have many bowel movements.  He is aware that is very important that he drink all of this in order to get adequate prep that he wants for successful colonoscopy, so that he does not have to do this again as an  outpatient.   Hematochezia with acute on chronic blood loss anemia - Based on patient's history of recurrent low-volume hematochezia, followed by more profuse bleeding in the setting of NSAID use, I am suspicious for radiation proctitis as the source of his hematochezia.  Hopefully will be able to identify a definitive bleeding source. - Colonoscopy tomorrow as above - Bowel prep with GoLytely  this evening  End-stage renal disease on dialysis - Plan for dialysis today   [name], MD  08/24/2024, 12:59 PM [facility]   I spent a total of 45 minutes reviewing the patient's medical record, interviewing and examining the patient, discussing his diagnosis and management of her condition going forward, and documenting in the medical record

## 2024-08-24 NOTE — Plan of Care (Signed)

## 2024-08-24 NOTE — Discharge Instructions (Signed)
 [employer] assistance programs Crisis assistance programs  -[facility]eless[facility] Arts development officer. If you are experiencing homelessness in [place], [place], your first point of contact should be Pensions consultant. You can reach Coordinated Entry by calling (336) (586)[phone] or by emailing [email or web address] [email or web address].  Community access points: [employer] [phone], [place]) every Tuesday from 9am-10am. [facility] ([address], [place]) every Wednesday from 8am-9am.   -[place] Coordinated Re-entry Marcy Panning: Dial 211 and request. Offers referrals to homeless shelters in the area.    -The [employer] 705-857-6775) offers several services to local families, as funding allows. The Emergency Assistance Program (EAP), which they administer, provides household goods, free food, clothing, and financial aid to people in need in the [employer] area. The EAP program does have some qualification, and counselors will interview clients for financial assistance by written referral only. Referrals need to be made by the Department of Social Services or by other EAP approved human services agencies or charities in the area.  -[facility] of [employer], which can be reached at [phone], offers emergency assistance programs for those in need of help, such as food, rent assistance, a soup kitchen, shelter, and clothing. They are based in [facility] but provide a number of services to those that qualify for assistance.   [facility] Department of Social Services may be able to offer temporary financial assistance and cash grants for paying rent and utilities, Help may be provided for local county residents who may be experiencing personal crisis when other resources, including government programs, are not available. Call [phone]  -High [employer] is a [employer] agency, The organization can offer emergency assistance for paying rent, [employer] Rx, utilities, food, household products and furniture. They offer extensive emergency and transitional housing for families, children and single women, and also run a Boy's and [employer]. Thrift Shops, Secondary school teacher, and other aid offered too. [address] [place], [place] [address], [phone]  -[place] Low Income Energy Assistance Program -- This is offered for [facility] families. The federal government created [employer] Program provides a one-time cash grant payment to help eligible low-income families pay their electric and heating bills. [address] [place], [place] [address], ([phone]  -[place] Emergency Assistance -- A program offers emergency utility and rent funds for greater [employer] area residents. The program can also provide counseling and referrals to charities and government programs. Also provides food and a free meal program that serves lunch Mondays - Saturdays and dinner seven days per week to individuals in the community. [address], [place], [place] [address], [phone]  -[employer] - Offers affordable apartment and housing communities across      [place] and [place]. The low income and seniors can access public housing, rental assistance to qualified applicants, and apply for the section 8 rent subsidy program. Other programs include Chiropractor and Engineer, maintenance. [address] [place], [place] [address], dial [phone].  -The [facility] provides transitional housing to veterans and the disabled. Clients will also access other services too, including assistance in applying for Disability, life skills classes, case management, and assistance in finding permanent housing. [address] [place], [place]  [place] [address], call [phone]  -[place] Transitional Housing through [facility] is for people who were just  evicted or that are formerly homeless. The non-profit will also help then gain self-sufficiency, find a home or apartment to live in, and also provides information on rent assistance when needed. Phone [phone]  -The [place] [facility] [facility] helps low income, elderly, or disabled residents in seven counties in the [place] ([place], [place], [place], [place], [place], [place], [place], and [place]) save energy and reduce their utility bills by improving energy efficiency. Phone [phone].  -[employer] is located in the [place] [facility] in the [employer], [address], [place], [place] [address]. Parking is in the rear of the building. Phone: [phone]   General Email: info@gsohc .org  GHC provides free housing counseling assistance in locating affordable rental housing or housing with support services for families and individuals in crisis and the chronically homeless. We provide potential resources for other housing needs like utilities. Our trained counselors also work with clients on budgeting and financial literacy in effort to empower them to take control of their financial situations. [employer] collaborates with homeless service providers and other stakeholders as part of the [employer] COC (Continuum of Care). The (COC) is a regional/local planning body that coordinates housing and services funding for homeless families and individuals. The role of [facility] in the COC is through housing counseling to work with people we serve on diversion strategies for those that are at imminent risk of becoming homeless. We also work with the Coordinated Assessment/Entry Specialist who attempts to find temporary solutions and/or connects the people  to Housing First, Rapid Re-housing or transitional housing programs. Our Homelessness Prevention Housing Counselors meet with clients on business days (Monday-Fridays, except scheduled holidays) from 8:30 am to 4:30 pm.  Legal assistance for evictions, foreclosure, and more -If you need free legal advice on civil issues, such as foreclosures, evictions, Electronics engineer, government programs, domestic issues and more, Armed forces operational officer Aid of [facility]) is a Associate Professor firm that provides free legal services and counsel to lower income people, seniors, disabled, and others, The goal is to ensure everyone has access to justice and fair representation. Call them at [phone].  [facility] for Housing and Community Studies can provide info about obtaining legal assistance with evictions. Phone [phone].  Data processing manager  The [employer], [employer]. offers job and Dispensing optician. Resources are focused on helping students obtain the skills and experiences that are necessary to compete in today's challenging and tight job market. The non-profit faith-based community action agency offers internship trainings as well as classroom instruction. Classes are tailored to meet the needs of people in the [facility] region. [place], [place] [address], [phone]  Foreclosure prevention/Debt Services Family Services of the [employer] Credit Counseling Service inludes debt and foreclosure prevention programs for local families. This includes money management, financial advice, budget review and development of a written action plan with a Pensions consultant to help solve specific individual financial problems. In addition, housing and mortgage counselors can also provide pre- and post-purchase homeownership counseling, default resolution counseling (to prevent foreclosure) and reverse mortgage counseling. A Debt Management Program allows  people and families with a high level of credit card or medical debt to consolidate and repay consumer debt and loans to creditors and rebuild positive credit ratings and scores. Contact (336) Q4373065.  Community clinics in [place] -Health Department [facility]: [address] [place], [place], [address]. [phone].  -Health Department [place] [facility]: [phone], [facility] [place], [address]. [phone].  -[facility] offers medical care through a group of doctors, pharmacies and other healthcare related agencies that offer services for low income, uninsured adults in [place]. Also offers adult Dental care and assistance with applying for an [employer]. Call [phone].   [facility]. This center provides low-cost health care to those without health insurance. Services offered include an onsite pharmacy. Phone [phone]. [address], [address], [place].  -Medication Assistance Program serves as a link between pharmaceutical companies and patients to provide low cost or free prescription medications. This service is available for residents who meet certain income restrictions and have no insurance coverage. PLEASE CALL [phone] Ginette Otto) OR [phone] ([place])  -One Step Further: Materials engineer, The [employer] Support & Nutrition Program, [employer]. Call [phone]/ [phone].  Food pantry and assistance -[facility]-Food Bank: [address], [place] [address]. Phone [phone]  -[facility] Food Pantry: [address] [place], [place] [address]. [phone].  -Missionary Ministry: has the purpose of visiting the sick and shut-ins and provide for needs in the surrounding communities. Call [phone]. Email: [email or web address] .[email or web address] This program provides: Food box for seniors, Financial assistance, Food to meet basic  nutritional needs.  -Meals on Wheels with Senior Resources: [facility] residents age 61 and over who are homebound and unable to obtain and prepare a nutritious meal for themselves are eligible for this service. There may be a waiting list in certain parts of [facility] if the route in that area is full. If you are in [facility] and [place] call [phone] to register. For all other areas call [phone] to register.  -Greater Dietitian: https://findfood.BargainContractor.[email or web address]  TRANSPORTATION: -[employer] Department of Health: Call [facility] and [employer] at [phone] for details. AttractionGuides.es  -Access [place]: Access [place] is the [employer] Agency's shared-ride transportation service for eligible riders who have a disability that prevents them from riding the fixed route bus. Call ([phone]. Access [place] riders must pay a fare of $1.50 per trip, or may purchase a 10-ride punch card for $14.00 ($1.40 per ride) or a 40-ride punch card for $48.00 ($1.20 per ride).  -The [facility]'s WHEELS rideshare transportation service is provided for senior citizens (60+) who live independently within [place] city limits and are unable to drive or have limited access to transportation. Call [phone] to schedule an appointment.  -[place] Transportation: For Medicare or Medicaid recipients call [phone]?[facility], wheelchair Zenaida Niece, and ambulatory quotes available.   FLEEING VIOLENCE: -Family Services of the [place]- 24/7 Crisis line (520)[phone]) -[facility]: (336) 641-SAFE [phone])  [place] 2-1-1 is another useful way to locate resources in the community. Visit ShedSizes.ch to find service information online. If you need additional assistance, 2-1-1 Referral Specialists are available 24 hours a day, every day by dialing  2-1-1 or [phone] from any phone. The call is free, confidential, and available in any language.  Affordable Housing Search http://www.nchousingsearch.org  [facility])   M-F 8a-3p [address] [place], [place] [address] [phone] Services include: laundry, barbering, support groups, case management, phone & computer access, showers, AA/NA mtgs, mental health/substance abuse nurse, job skills class, disability information, VA assistance, spiritual classes, etc. Winter Shelter available when temperatures are less than 32 degrees.   HOMELESS SHELTERS [facility] at [facility]- Call [phone] ext. [phone]  or ext. 336. Located at [address], [place], [place] [address]  [facility] Mens Shelter- Call [phone]. Located at [address] [place] [address].  [facility]- [employer]. Call [phone]. Office located at [address] [place] [address].  [facility] Family Housing through [place] [phone].  [facility]- Call [phone]. Located at [address] [place], [place], [place] [address].  [facility]-For Pregnant mothers. Call [phone]. Located at [address] [place], [address].  [facility]-For men in [place]. Call [phone]. [facility]-Shelter in [place]. Call [phone].  Home of [employer] for [employer] [phone]. Office located at [address] [place], [address].  [employer] be agreeable to help with chores. Call [phone] ext. 5000.  Men's: [address] [place],  [address]. Women's: [facility]  [address] [place], [place] [address]  Crisis Services [facility] Mobile Crisis Management- [phone]  [facility] [address], [place], [place] [address]. Phone: [phone]

## 2024-08-24 NOTE — Progress Notes (Signed)
*  Late entry* pt refused GI prep, p,m meds and Flex Sig procedure. Pt was educated multiple times by chartered loss adjuster and Consulting Civil Engineer, ie- rationales and etc... pt is addiment and defiant on NO procedures if it interferes with dialysis treatment today. amongst other objections. Pt would like to speak to GI team moving forward with POC.

## 2024-08-24 NOTE — Anesthesia Preprocedure Evaluation (Signed)
 "                                  Anesthesia Evaluation  Patient identified by MRN, date of birth, ID band Patient awake    Reviewed: Allergy & Precautions, NPO status , Patient's Chart, lab work & pertinent test results  History of Anesthesia Complications Negative for: history of anesthetic complications  Airway Mallampati: III  TM Distance: >3 FB     Dental  (+) Loose, Chipped, Dental Advisory Given,    Pulmonary neg COPD, neg recent URI   breath sounds clear to auscultation       Cardiovascular hypertension, Pt. on medications and Pt. on home beta blockers (-) angina +CHF  (-) Past MI + dysrhythmias Supra Ventricular Tachycardia + Valvular Problems/Murmurs (mild via TTE 2024) AS  Rhythm:Regular   TTE (2024): 1. Left ventricular ejection fraction, by estimation, is 50 to 55%. The  left ventricle has low normal function. The left ventricle has no regional  wall motion abnormalities. There is severe concentric left ventricular  hypertrophy. Left ventricular  diastolic parameters are indeterminate.   2. Right ventricular systolic function is normal. The right ventricular  size is normal. Tricuspid regurgitation signal is inadequate for assessing  PA pressure.   3. Left atrial size was severely dilated.   4. Moderate pericardial effusion. The pericardial effusion is localized  near the right atrium. There is no evidence of cardiac tamponade.   5. The mitral valve is normal in structure. Mild mitral valve  regurgitation. No evidence of mitral stenosis.   6. The aortic valve is calcified. Aortic valve regurgitation is not  visualized. Mild aortic valve stenosis. Aortic valve area, by VTI measures  2.08 cm. Aortic valve mean gradient measures 17.0 mmHg. Aortic valve Vmax  measures 2.52 m/s.   7. There is moderate dilatation of the ascending aorta, measuring 43 mm.   8. The inferior vena cava is normal in size with greater than 50%  respiratory variability,  suggesting right atrial pressure of 3 mmHg.     Neuro/Psych negative neurological ROS  negative psych ROS   GI/Hepatic  Hx of GI Bleed   Endo/Other   Hyperparathyroidism   Renal/GU Dialysis and ESRFRenal diseaseLab Results      Component                Value               Date                      NA                       139                 08/25/2024                K                        3.8                 08/25/2024                CO2                      31  08/25/2024                GLUCOSE                  88                  08/25/2024                BUN                      22 (H)              08/25/2024                CREATININE               7.12 (H)            08/25/2024                CALCIUM                   10.0                08/25/2024                GFRNONAA                 8 (L)               08/25/2024               Hx of Bladder Cancer s/p TURBT; Hematuria     Musculoskeletal negative musculoskeletal ROS (+)    Abdominal   Peds  Hematology  (+) Blood dyscrasia, anemia Lab Results      Component                Value               Date                      WBC                      5.1                 08/25/2024                HGB                      8.8 (L)             08/25/2024                HCT                      27.5 (L)            08/25/2024                MCV                      90.8                08/25/2024                PLT                      214  08/25/2024              Anesthesia Other Findings   Reproductive/Obstetrics                              Anesthesia Physical Anesthesia Plan  ASA: 3  Anesthesia Plan: MAC   Post-op Pain Management:    Induction: Intravenous  PONV Risk Score and Plan: 1 and Propofol  infusion and Treatment may vary due to age or medical condition  Airway Management Planned: Natural Airway and Simple Face Mask  Additional Equipment:  None  Intra-op Plan:   Post-operative Plan:   Informed Consent: I have reviewed the patients History and Physical, chart, labs and discussed the procedure including the risks, benefits and alternatives for the proposed anesthesia with the patient or authorized representative who has indicated his/her understanding and acceptance.     Dental advisory given  Plan Discussed with: CRNA  Anesthesia Plan Comments:          Anesthesia Quick Evaluation  "

## 2024-08-24 NOTE — Progress Notes (Signed)
 " [facility] Progress Note   Subjective:   Seen in room - sitting in bed, denies CP or dyspnea. Was supposed to have colonoscopy today but he refused the prep. Now agreeable to start prep today for colonoscopy tomorrow but insistent to have HD today to get back on his usual schedule. Will order/make it work.  Objective Vitals:   08/23/24 2356 08/24/24 0300 08/24/24 0941 08/24/24 0955  BP: 118/71 105/74 116/76 119/79  Pulse:  77 85 76  Resp:  20 20 15   Temp: 98.2 F (36.8 C) 98.4 F (36.9 C) 98.2 F (36.8 C) 98.3 F (36.8 C)  TempSrc: Oral Oral Oral Oral  SpO2:  96% 95% 98%  Weight:      Height:       Physical Exam General: Well appearing, NAD. Room air Heart:RRR; no murmur Lungs: CTAB; no rales Abdomen: soft Extremities: 1+ BLE edema Dialysis Access: L AVF +t/b  Additional Objective Labs: Basic Metabolic Panel: Recent Labs  Lab 08/21/24 1745 08/23/24 0340 08/24/24 0330  NA 141 141 137  K 3.7 4.2 4.1  CL 95* 96* 94*  CO2 33* 32 33*  GLUCOSE 138* 92 80  BUN 48* 59* 33*  CREATININE 10.50* 13.30* 8.67*  CALCIUM  9.7 9.4 9.4  PHOS  --  5.9* 4.8*   Liver Function Tests: Recent Labs  Lab 08/21/24 1745 08/23/24 0340 08/24/24 0330  AST 29  --   --   ALT 13  --   --   ALKPHOS 142*  --   --   BILITOT 0.3  --   --   PROT 7.0  --   --   ALBUMIN  3.9 3.5 3.5   CBC: Recent Labs  Lab 08/21/24 1745 08/22/24 1605 08/22/24 2331 08/23/24 0340 08/23/24 1122 08/23/24 1849 08/23/24 2110 08/24/24 0330  WBC 5.7 4.6  --  5.3  --   --   --   --   NEUTROABS 3.6 3.2  --   --   --   --   --   --   HGB 7.6* 6.5*   < > 7.2*   < > 7.5* 7.9* 7.4*  HCT 25.0* 21.7*   < > 23.3*   < > 23.9* 25.5* 23.7*  MCV 96.9 96.0  --  95.9  --   --   --   --   PLT 174 165  --  155  --   --   --   --    < > = values in this interval not displayed.   Medications:  sodium chloride       atorvastatin   40 mg Oral Daily   Chlorhexidine  Gluconate Cloth  6 each Topical Q0600    cinacalcet   60 mg Oral Q breakfast   hydrocortisone   1 Application Rectal BID   midodrine   5 mg Oral Q M,W,F-HD   Na Sulfate-K Sulfate-Mg Sulfate concentrate  0.5 kit Oral Once   Followed by   Na Sulfate-K Sulfate-Mg Sulfate concentrate  0.5 kit Oral Once   simethicone   240 mg Oral Once   Followed by   simethicone   240 mg Oral Once   sodium chloride  flush  3 mL Intravenous Q12H   Dialysis Orders [facility] MWF 4.5h  B450   135kg   2K bath  AVF  Hep none - Mircera 225mcg q 2 weeks (last 1/5) - Calcitriol  2.56mcg PO q HD   Assessment/Plan: Lower GI bleed: For prep today and scope tomorrow. S/p 1U blood 1/7,  for another today. ESRD: Usual MWF schedule, s/p HD yesterday - next HD today to get back on schedule. HypoTN/volume: BP stable, on mido pre-HD. Some edema, UF as tolerated. Anemia of ESRD: See #1. Has still been getting ESA, not due. Secondary HPTH: Ca/Phos ok - resume binders once on regular diet Bladder/prostate cancer: For TURBT 1/15 R hand Fx: In cast   Izetta Boehringer, PA-C 08/24/2024, 10:07 AM  [facility]    "

## 2024-08-24 NOTE — Plan of Care (Signed)

## 2024-08-24 NOTE — TOC Initial Note (Signed)
 Transition of Care [facility]) - Initial/Assessment Note    Patient Details  Name: Devin Bullock MRN: [id number] Date of Birth: Sep 05, 1965  Transition of Care [facility]) CM/SW Contact:    Landry DELENA Senters, RN Phone Number: 08/24/2024, 10:29 AM  Clinical Narrative:                 RR:fziprjo history significant of ESRD on HD MWF, prostate and bladder cancer s/p TURP 2022, anemia of chronic disease, paroxysmal SVT, history of spontaneous right perinephric hematoma in June 2025, who presents with presents with blood in his stools   Patient lives alone, reports he has family that is available for support when needed and they will transport home at d/c.   Patient has PCP, manages own medications, DME reviewed-cane, rollator, HD at [facility] [place] on MWF, reports he drives himself and is also set up with [employer] transportation if needed.   Patient does report having trouble affording food at home, is requesting resources to assist with this. CM to find resources for food.   Continued medical workup.  CM will continue to follow.  Expected Discharge Plan:  (TBD) Barriers to Discharge: Continued Medical Work up   Patient Goals and CMS Choice            Expected Discharge Plan and Services       Living arrangements for the past 2 months: Apartment                                      Prior Living Arrangements/Services Living arrangements for the past 2 months: Apartment Lives with:: Self Patient language and need for interpreter reviewed:: Yes Do you feel safe going back to the place where you live?: Yes      Need for Family Participation in Patient Care: Yes (Comment) Care giver support system in place?: Yes (comment) Current home services: DME (cane, rollator) Criminal Activity/Legal Involvement Pertinent to Current Situation/Hospitalization: No - Comment as needed  Activities of Daily Living   ADL Screening (condition at time of admission) Independently performs ADLs?: Yes  (appropriate for developmental age) Is the patient deaf or have difficulty hearing?: No Does the patient have difficulty seeing, even when wearing glasses/contacts?: No Does the patient have difficulty concentrating, remembering, or making decisions?: No  Permission Sought/Granted                  Emotional Assessment Appearance:: Developmentally appropriate Attitude/Demeanor/Rapport: Engaged Affect (typically observed): Calm Orientation: : Oriented to Self, Oriented to Place, Oriented to  Time, Oriented to Situation Alcohol / Substance Use: Not Applicable Psych Involvement: No (comment)  Admission diagnosis:  Internal hemorrhoids [K64.8] GI bleed [K92.2] Symptomatic anemia [D64.9] Gastrointestinal hemorrhage, unspecified gastrointestinal hemorrhage type [K92.2] Hematuria, unspecified type [R31.9] Patient Active Problem List   Diagnosis Date Noted   GI bleed 08/22/2024   History of bladder cancer 08/22/2024   Right hand fracture 08/22/2024   Heart failure with preserved ejection fraction (HCC) 08/22/2024   Obesity 08/22/2024   Hypotension 07/14/2024   Hyperlipidemia 05/14/2024   Other skin changes 08/01/2023   Gross hematuria 05/28/2023   Paroxysmal SVT (supraventricular tachycardia) 05/21/2023   Lower GI bleed 03/16/2023   Prostate cancer (HCC) 03/16/2023   Disorder of phosphorus metabolism, unspecified 08/27/2022   Bladder cancer (HCC) 07/28/2021   HFrEF (heart failure with reduced ejection fraction) (HCC) 07/02/2021   Thyromegaly 07/02/2021   Fistula, other specified site  08/13/2020   Essential hypertension    Acute blood loss anemia 04/02/2020   Secondary hyperparathyroidism of renal origin 11/28/2018   Cardiomyopathy, unspecified (HCC) 09/22/2016   Coagulation defect, unspecified 09/22/2016   Iron deficiency anemia, unspecified 09/22/2016   Male erectile dysfunction, unspecified 09/22/2016   ESRD (end stage renal disease) on dialysis (HCC) 08/08/2013    Patient nonadherence 04/17/2013   Obesity, Class II, BMI 35-39.9 04/15/2013   Anemia of chronic disease 04/15/2013   Cardiomegaly 04/13/2013   PCP:  [facility] Pharmacy:   [id number] [address], [place] - [address] [place] [address] Phone: [phone] Fax: [phone]  [facility] [address], [place] - [address] [place] [address] Phone: [phone] Fax: [phone]     Social Drivers of Health (SDOH) Social History: SDOH Screenings   Food Insecurity: Food Insecurity Present (08/23/2024)  Housing: Low Risk (08/23/2024)  Transportation Needs: No Transportation Needs (08/23/2024)  Utilities: Not At Risk (08/23/2024)  Tobacco Use: Low Risk (08/21/2024)   SDOH Interventions:     Readmission Risk Interventions    05/25/2024    2:16 PM  Readmission Risk Prevention Plan  Transportation Screening Complete  HRI or Home Care Consult Complete  Social Work Consult for Recovery Care Planning/Counseling Complete  Palliative Care Screening Not Applicable  Medication Review Oceanographer) Complete

## 2024-08-24 NOTE — Procedures (Signed)
 HD Note:  Some information was entered later than the data was gathered due to patient care needs. The stated time with the data is accurate.  Received patient in bed to unit.   Alert and oriented.   Informed consent signed and in chart.   Access used: left forearm fistula Access issues: None  Patient tolerated treatment well.   TX duration: 2.5 hours  Alert, without acute distress.  Total UF removed: 500 ml  Hand-off given to patient's nurse.   Transported back to the room   Stephens Shreve L. Lenon, RN Kidney Dialysis Unit.

## 2024-08-24 NOTE — Progress Notes (Signed)
 [facility] ROUNDING NOTE   Subjective: Patient's bleeding has slowed down significantly.   Hgb stable since transfusion.  He refused to do his bowel prep last night and stated he was not going to do any procedures overnight to his nurse.  He continues to be very difficult with adhering to the recommendations and plans made by his care team and he seems very distrustful of the medical system in general.   Objective: Vital signs in last 24 hours: Temp:  [97.6 F (36.4 C)-98.4 F (36.9 C)] 98.3 F (36.8 C) (01/09 1230) Pulse Rate:  [69-90] 69 (01/09 1230) Resp:  [15-22] 20 (01/09 1230) BP: (93-121)/(56-79) 112/62 (01/09 1230) SpO2:  [95 %-100 %] 98 % (01/09 0956) Weight:  [137.8 kg] 137.8 kg (01/09 0956) Last BM Date : 08/23/24 General: NAD, obese African American male Lungs:  CTA b/l, no w/r/r Heart:  RRR, no m/r/g Abdomen:  Soft, NT, ND, +BS Ext:  No c/c/e    Intake/Output from previous day: 01/08 0701 - 01/09 0700 In: -  Out: 3000  Intake/Output this shift: Total I/O In: 626.8 [P.O.:300; Blood:326.8] Out: 0    Lab Results: Recent Labs    08/21/24 1745 08/22/24 1605 08/22/24 2331 08/23/24 0340 08/23/24 1122 08/23/24 1849 08/23/24 2110 08/24/24 0330  WBC 5.7 4.6  --  5.3  --   --   --   --   HGB 7.6* 6.5*   < > 7.2*   < > 7.5* 7.9* 7.4*  PLT 174 165  --  155  --   --   --   --   MCV 96.9 96.0  --  95.9  --   --   --   --    < > = values in this interval not displayed.   BMET Recent Labs    08/21/24 1745 08/23/24 0340 08/24/24 0330  NA 141 141 137  K 3.7 4.2 4.1  CL 95* 96* 94*  CO2 33* 32 33*  GLUCOSE 138* 92 80  BUN 48* 59* 33*  CREATININE 10.50* 13.30* 8.67*  CALCIUM  9.7 9.4 9.4   LFT Recent Labs    08/21/24 1745 08/23/24 0340 08/24/24 0330  PROT 7.0  --   --   ALBUMIN  3.9 3.5 3.5  AST 29  --   --   ALT 13  --   --   ALKPHOS 142*  --   --   BILITOT 0.3  --   --    PT/INR Recent Labs    08/21/24 1745  INR 1.2       Imaging/Other results: No results found.    Assessment and Plan:  59 year old male with history of prostate/bladder cancer status post TURBT 2022 with chronic history of intermittent mild gross hematuria and hematochezia, who was admitted with much more severe hematuria and rectal bleeding with passage of large clots with decline in hemoglobin.  This excessive bleeding occurred shortly after taking naproxen  for a hand fracture.  Hemodynamically stable. He has been scheduled for a outpatient colonoscopy on numerous occasions, but has never been able to complete the procedures for 1 reason or another.  The day he was admitted, we recommended rapid prep and a colonoscopy the next day, but he was adamantly opposed to this, and requested to put off the colonoscopy a day to make sure that his bowel prep was adequate so that he would not have to potentially repeat a colonoscopy as an outpatient.  We were wanting to do a more  expedited colonoscopy because of the severity of his bleeding, but we acquiesced.  His bleeding did slow down thankfully, and then when we plan to prep him yesterday for colonoscopy today, he became upset because we would not let him eat.  Then when I offered to let him eat last night and plan to do a flexible sigmoidoscopy to specifically evaluate the source of bleeding, he was upset because we were not going to examine his whole colon.  He has been very difficult to manage and is disgruntled with what ever decision that I make.  After extensive discussion today (over 30 minutes), he did finally agree to proceed with a colonoscopy for tomorrow.  He understands he needs to be on a clear liquid diet all day today and drink a gallon of foul tasting fluid that will make him have many bowel movements.  He is aware that is very important that he drink all of this in order to get adequate prep that he wants for successful colonoscopy, so that he does not have to do this again as an  outpatient.   Hematochezia with acute on chronic blood loss anemia - Based on patient's history of recurrent low-volume hematochezia, followed by more profuse bleeding in the setting of NSAID use, I am suspicious for radiation proctitis as the source of his hematochezia.  Hopefully will be able to identify a definitive bleeding source. - Colonoscopy tomorrow as above - Bowel prep with GoLytely  this evening  End-stage renal disease on dialysis - Plan for dialysis today   [name], MD  08/24/2024, 12:59 PM [facility]   I spent a total of 45 minutes reviewing the patient's medical record, interviewing and examining the patient, discussing his diagnosis and management of her condition going forward, and documenting in the medical record

## 2024-08-25 ENCOUNTER — Encounter (HOSPITAL_COMMUNITY): Admission: EM | Disposition: A | Payer: Self-pay | Source: Home / Self Care | Attending: Internal Medicine

## 2024-08-25 ENCOUNTER — Inpatient Hospital Stay (HOSPITAL_COMMUNITY): Payer: Self-pay

## 2024-08-25 ENCOUNTER — Encounter (HOSPITAL_COMMUNITY): Payer: Self-pay | Admitting: Internal Medicine

## 2024-08-25 DIAGNOSIS — D120 Benign neoplasm of cecum: Secondary | ICD-10-CM

## 2024-08-25 DIAGNOSIS — D123 Benign neoplasm of transverse colon: Secondary | ICD-10-CM

## 2024-08-25 DIAGNOSIS — D122 Benign neoplasm of ascending colon: Secondary | ICD-10-CM

## 2024-08-25 DIAGNOSIS — D124 Benign neoplasm of descending colon: Secondary | ICD-10-CM

## 2024-08-25 DIAGNOSIS — I132 Hypertensive heart and chronic kidney disease with heart failure and with stage 5 chronic kidney disease, or end stage renal disease: Secondary | ICD-10-CM

## 2024-08-25 DIAGNOSIS — K6389 Other specified diseases of intestine: Secondary | ICD-10-CM

## 2024-08-25 DIAGNOSIS — D125 Benign neoplasm of sigmoid colon: Secondary | ICD-10-CM

## 2024-08-25 DIAGNOSIS — K627 Radiation proctitis: Secondary | ICD-10-CM

## 2024-08-25 DIAGNOSIS — D62 Acute posthemorrhagic anemia: Secondary | ICD-10-CM | POA: Diagnosis not present

## 2024-08-25 DIAGNOSIS — N186 End stage renal disease: Secondary | ICD-10-CM

## 2024-08-25 DIAGNOSIS — I5020 Unspecified systolic (congestive) heart failure: Secondary | ICD-10-CM | POA: Diagnosis not present

## 2024-08-25 DIAGNOSIS — D175 Benign lipomatous neoplasm of intra-abdominal organs: Secondary | ICD-10-CM

## 2024-08-25 HISTORY — PX: COLONOSCOPY: SHX5424

## 2024-08-25 HISTORY — PX: POLYPECTOMY: SHX149

## 2024-08-25 HISTORY — PX: BONE BIOPSY: SHX375

## 2024-08-25 LAB — TYPE AND SCREEN
ABO/RH(D): O POS
Antibody Screen: NEGATIVE
Unit division: 0
Unit division: 0

## 2024-08-25 LAB — RENAL FUNCTION PANEL
Albumin: 3.8 g/dL (ref 3.5–5.0)
Anion gap: 15 (ref 5–15)
BUN: 22 mg/dL — ABNORMAL HIGH (ref 6–20)
CO2: 31 mmol/L (ref 22–32)
Calcium: 10 mg/dL (ref 8.9–10.3)
Chloride: 93 mmol/L — ABNORMAL LOW (ref 98–111)
Creatinine, Ser: 7.12 mg/dL — ABNORMAL HIGH (ref 0.61–1.24)
GFR, Estimated: 8 mL/min — ABNORMAL LOW
Glucose, Bld: 88 mg/dL (ref 70–99)
Phosphorus: 3.6 mg/dL (ref 2.5–4.6)
Potassium: 3.8 mmol/L (ref 3.5–5.1)
Sodium: 139 mmol/L (ref 135–145)

## 2024-08-25 LAB — BPAM RBC
Blood Product Expiration Date: 202601292359
Blood Product Expiration Date: 202602012359
ISSUE DATE / TIME: 202601071704
ISSUE DATE / TIME: 202601090934
Unit Type and Rh: 5100
Unit Type and Rh: 5100

## 2024-08-25 LAB — CBC
HCT: 27.5 % — ABNORMAL LOW (ref 39.0–52.0)
Hemoglobin: 8.8 g/dL — ABNORMAL LOW (ref 13.0–17.0)
MCH: 29 pg (ref 26.0–34.0)
MCHC: 32 g/dL (ref 30.0–36.0)
MCV: 90.8 fL (ref 80.0–100.0)
Platelets: 214 K/uL (ref 150–400)
RBC: 3.03 MIL/uL — ABNORMAL LOW (ref 4.22–5.81)
RDW: 18.6 % — ABNORMAL HIGH (ref 11.5–15.5)
WBC: 5.1 K/uL (ref 4.0–10.5)
nRBC: 0 % (ref 0.0–0.2)

## 2024-08-25 MED ORDER — SODIUM CHLORIDE 0.9 % IV SOLN: INTRAVENOUS | Status: DC | PRN

## 2024-08-25 MED ORDER — PHENYLEPHRINE 80 MCG/ML (10ML) SYRINGE FOR IV PUSH (FOR BLOOD PRESSURE SUPPORT)
PREFILLED_SYRINGE | INTRAVENOUS | Status: DC | PRN
  Administered 2024-08-25 (×2): 120 ug via INTRAVENOUS
  Administered 2024-08-25 (×3): 80 ug via INTRAVENOUS
  Administered 2024-08-25 (×2): 120 ug via INTRAVENOUS
  Administered 2024-08-25: 80 ug via INTRAVENOUS

## 2024-08-25 MED ORDER — PROPOFOL 10 MG/ML IV BOLUS
INTRAVENOUS | Status: DC | PRN
  Administered 2024-08-25: 10 mg via INTRAVENOUS
  Administered 2024-08-25 (×2): 20 mg via INTRAVENOUS

## 2024-08-25 MED ORDER — LIDOCAINE 2% (20 MG/ML) 5 ML SYRINGE
INTRAMUSCULAR | Status: DC | PRN
  Administered 2024-08-25: 100 mg via INTRAVENOUS

## 2024-08-25 MED ORDER — PROPOFOL 500 MG/50ML IV EMUL
INTRAVENOUS | Status: DC | PRN
  Administered 2024-08-25: 100 ug/kg/min via INTRAVENOUS

## 2024-08-25 MED ORDER — SEVELAMER CARBONATE 800 MG PO TABS
2400.0000 mg | ORAL_TABLET | Freq: Three times a day (TID) | ORAL | Status: DC
  Administered 2024-08-25 – 2024-08-26 (×4): 2400 mg via ORAL
  Filled 2024-08-25 (×4): qty 3

## 2024-08-25 MED ORDER — SEVELAMER CARBONATE 800 MG PO TABS: 800.0000 mg | ORAL_TABLET | ORAL | Status: DC | PRN

## 2024-08-25 NOTE — Progress Notes (Signed)
 " PROGRESS NOTE    Devin Bullock  FMW:[id number] DOB: 1965/11/24 DOA: 08/21/2024 PCP: [facility]   Brief Narrative:   Devin Bullock is a 59 y.o. male with medical history significant of ESRD on HD MWF, prostate and bladder cancer s/p TURP 2022, anemia of chronic disease, paroxysmal SVT, history of spontaneous right perinephric hematoma in June 2025, who presents with presents with blood in his stools as well as ongoing hematuria.  Patient recently placed on naproxen  for fracture with subsequent episodes of bloody stool and clots passed via rectum over the past 2 days.  Patient also notes a single episode of blood clot passed with urine complicated by his known prostate and bladder cancer. Of note patient is scheduled for Transurethral Resection of a Bladder Tumor (TURBT) with possible instillation of intravesical chemotherapy on January 15th at [facility]. He has experienced similar episodes in the past, but describes this episode as less severe than previous ones in regards to hematuria.  Given his subsequent drop in hemoglobin from baseline of 8/9 and need for ongoing transfusion hospitalist was called for admission and GI was called in consult. (Of note patient had scheduled colonoscopy with [facility] next week)  Assessment & Plan:   Active Problems:   Acute blood loss anemia   GI bleed   Gross hematuria   History of bladder cancer   Right hand fracture   ESRD (end stage renal disease) on dialysis (HCC)   Heart failure with preserved ejection fraction (HCC)   Obesity   Lower GI bleed   Radiation proctitis   Colonic mass   Benign neoplasm of transverse colon   Acute on chronic blood loss anemia secondary to lower GI bleed -Reports hematochezia, most likely lower GI bleed. - When show he went for colonoscopy this morning after multiple attempts as he had some noncompliance with diet initially.   - Workup significant for radiation proctitis, but appears bleeding  has stabilized, hemoglobin has been stable, continue to monitor, as well large polyp, concerning for malignancy, biopsies were sent as well.  . - So far required 3 units PRBC transfusion, hemoglobin has been stable over last 24 hours  Bladder and prostate cancer Acute recurrent hematuria - Currently following at [facility]  - Plan for TURBT 1/15 -No further hematuria over last 24 hours   Right hand fracture, POA Currently in a cast Discontinue NSAIDs given above   ESRD on HD MWF Chronic hypotension -Currently volume overloaded, management per renal, went for HD yesterday, next session 1/12.  Due for dialysis this morning, likely to undergo HD later today per nephrology -appreciate insight recommendations - Continue midodrine  with dialysis    Heart failure with preserved EF -Volume management with dialysis as above Prior echo 2024 -EF to be 50 to 55% with indeterminate diastolic parameter   Obesity, class II BMI 36.76 kg/m  DVT prophylaxis: SCDs Start: 08/22/24 1830 -no chemical prophylaxis given above Code Status:   Code Status: Full Code  Family Communication: None present  Status is: Inpatient  Dispo: The patient is from: Home              Anticipated d/c is to: Home              Anticipated d/c date is: To be determined              Patient currently not medically stable for discharge  Consultants:  GI, nephrology  Procedures:  HD per nephrology schedule  Potential endoscopy upper versus lower with GI pending schedule  Antimicrobials:  None indicated  Subjective:  Denies any shortness of breath or chest pain today,  Objective: Vitals:   08/25/24 1003 08/25/24 1103 08/25/24 1115 08/25/24 1130  BP: (!) 140/93 (!) 93/42 112/74 130/87  Pulse: 81 72 76 77  Resp: (!) 22 19 16 15   Temp:  97.7 F (36.5 C)  (!) 97.2 F (36.2 C)  TempSrc: Temporal   Oral  SpO2: 97% 100% 98% 99%  Weight:      Height:        Intake/Output Summary (Last 24 hours) at  08/25/2024 1344 Last data filed at 08/24/2024 1653 Gross per 24 hour  Intake --  Output 500 ml  Net -500 ml   Filed Weights   08/23/24 1526 08/24/24 0956 08/24/24 1405  Weight: (!) 137.8 kg (!) 137.8 kg (!) 140.1 kg    Examination:  Awake Alert, Oriented X 3, No new F.N deficits, Normal affect Symmetrical Chest wall movement, Good air movement bilaterally, CTAB RRR,No Gallops,Rubs or new Murmurs, No Parasternal Heave +ve B.Sounds, Abd Soft, No tenderness, No rebound - guarding or rigidity. No Cyanosis, Clubbing , + lower extremity edema, right wrist in a cast   Data Reviewed: I have personally reviewed following labs and imaging studies  CBC: Recent Labs  Lab 08/21/24 1745 08/22/24 1605 08/22/24 2331 08/23/24 0340 08/23/24 1122 08/23/24 1849 08/23/24 2110 08/24/24 0330 08/25/24 0459  WBC 5.7 4.6  --  5.3  --   --   --   --  5.1  NEUTROABS 3.6 3.2  --   --   --   --   --   --   --   HGB 7.6* 6.5*   < > 7.2* 7.0* 7.5* 7.9* 7.4* 8.8*  HCT 25.0* 21.7*   < > 23.3* 22.6* 23.9* 25.5* 23.7* 27.5*  MCV 96.9 96.0  --  95.9  --   --   --   --  90.8  PLT 174 165  --  155  --   --   --   --  214   < > = values in this interval not displayed.   Basic Metabolic Panel: Recent Labs  Lab 08/21/24 1745 08/23/24 0340 08/24/24 0330 08/25/24 0459  NA 141 141 137 139  K 3.7 4.2 4.1 3.8  CL 95* 96* 94* 93*  CO2 33* 32 33* 31  GLUCOSE 138* 92 80 88  BUN 48* 59* 33* 22*  CREATININE 10.50* 13.30* 8.67* 7.12*  CALCIUM  9.7 9.4 9.4 10.0  PHOS  --  5.9* 4.8* 3.6   GFR: Estimated Creatinine Clearance: 16.9 mL/min (A) (by C-G formula based on SCr of 7.12 mg/dL (H)). Liver Function Tests: Recent Labs  Lab 08/21/24 1745 08/23/24 0340 08/24/24 0330 08/25/24 0459  AST 29  --   --   --   ALT 13  --   --   --   ALKPHOS 142*  --   --   --   BILITOT 0.3  --   --   --   PROT 7.0  --   --   --   ALBUMIN  3.9 3.5 3.5 3.8   No results for input(s): LIPASE, AMYLASE in the last 168  hours. No results for input(s): AMMONIA in the last 168 hours. Coagulation Profile: Recent Labs  Lab 08/21/24 1745  INR 1.2   No results found for this or any previous visit (from the past 240 hours).  Radiology Studies: No results found.  Scheduled Meds:  atorvastatin   40 mg Oral Daily   Chlorhexidine  Gluconate Cloth  6 each Topical Q0600   cinacalcet   60 mg Oral Q breakfast   hydrocortisone   1 Application Rectal BID   midodrine   5 mg Oral Q M,W,F-HD   Na Sulfate-K Sulfate-Mg Sulfate concentrate  0.5 kit Oral Once   sevelamer  carbonate  2,400 mg Oral TID WC   simethicone   240 mg Oral Once   Followed by   simethicone   240 mg Oral Once   sodium chloride  flush  3 mL Intravenous Q12H   Continuous Infusions:     LOS: 3 days     Brayton Lye, MD [facility]  If 7PM-7AM, please contact night-coverage [email or web address]  08/25/2024, 1:44 PM      "

## 2024-08-25 NOTE — Interval H&P Note (Signed)
 History and Physical Interval Note:  08/25/2024 10:05 AM  Devin Bullock  has presented today for surgery, with the diagnosis of Hematochezia.  The various methods of treatment have been discussed with the patient and family. After consideration of risks, benefits and other options for treatment, the patient has consented to  Procedures: COLONOSCOPY (N/A) as a surgical intervention.  The patient's history has been reviewed, patient examined, no change in status, stable for surgery.  I have reviewed the patient's chart and labs.  Questions were answered to the patient's satisfaction.     [name]

## 2024-08-25 NOTE — Progress Notes (Signed)
 " [facility] Progress Note   Subjective:   Patient seen and examined at bedside.  States he tolerated prep overnight and feels like he has been cleaned out.  Nervous about colonoscopy.  States bleeding improved overnight.  Denies CP, SOB, abdominal pain, dizziness, weakness and fatigued.    Objective Vitals:   08/25/24 1003 08/25/24 1103 08/25/24 1115 08/25/24 1130  BP: (!) 140/93 (!) 93/42 112/74 130/87  Pulse: 81 72 76 77  Resp: (!) 22 19 16 15   Temp:  97.7 F (36.5 C)    TempSrc: Temporal     SpO2: 97% 100% 98% 99%  Weight:      Height:       Physical Exam General:well appearing male in NAD Heart:RRR, no mrg Lungs:CTAB, nml WOB on RA Abdomen:soft, NTND Extremities:trace LE edema Dialysis Access: LU AVF +b/t   Filed Weights   08/23/24 1526 08/24/24 0956 08/24/24 1405  Weight: (!) 137.8 kg (!) 137.8 kg (!) 140.1 kg    Intake/Output Summary (Last 24 hours) at 08/25/2024 1140 Last data filed at 08/24/2024 1653 Gross per 24 hour  Intake 326.75 ml  Output 500 ml  Net -173.25 ml    Additional Objective Labs: Basic Metabolic Panel: Recent Labs  Lab 08/23/24 0340 08/24/24 0330 08/25/24 0459  NA 141 137 139  K 4.2 4.1 3.8  CL 96* 94* 93*  CO2 32 33* 31  GLUCOSE 92 80 88  BUN 59* 33* 22*  CREATININE 13.30* 8.67* 7.12*  CALCIUM  9.4 9.4 10.0  PHOS 5.9* 4.8* 3.6   Liver Function Tests: Recent Labs  Lab 08/21/24 1745 08/23/24 0340 08/24/24 0330 08/25/24 0459  AST 29  --   --   --   ALT 13  --   --   --   ALKPHOS 142*  --   --   --   BILITOT 0.3  --   --   --   PROT 7.0  --   --   --   ALBUMIN  3.9 3.5 3.5 3.8   CBC: Recent Labs  Lab 08/21/24 1745 08/22/24 1605 08/22/24 2331 08/23/24 0340 08/23/24 1122 08/23/24 2110 08/24/24 0330 08/25/24 0459  WBC 5.7 4.6  --  5.3  --   --   --  5.1  NEUTROABS 3.6 3.2  --   --   --   --   --   --   HGB 7.6* 6.5*   < > 7.2*   < > 7.9* 7.4* 8.8*  HCT 25.0* 21.7*   < > 23.3*   < > 25.5* 23.7* 27.5*   MCV 96.9 96.0  --  95.9  --   --   --  90.8  PLT 174 165  --  155  --   --   --  214   < > = values in this interval not displayed.   Medications:   [MAR Hold] atorvastatin   40 mg Oral Daily   [MAR Hold] Chlorhexidine  Gluconate Cloth  6 each Topical Q0600   [MAR Hold] cinacalcet   60 mg Oral Q breakfast   [MAR Hold] hydrocortisone   1 Application Rectal BID   [MAR Hold] midodrine   5 mg Oral Q M,W,F-HD   [MAR Hold] Na Sulfate-K Sulfate-Mg Sulfate concentrate  0.5 kit Oral Once   [MAR Hold] simethicone   240 mg Oral Once   Followed by   [MAR Hold] simethicone   240 mg Oral Once   [MAR Hold] sodium chloride  flush  3 mL Intravenous Q12H  Dialysis Orders: [facility] MWF 4.5h  B450   135kg   2K bath  AVF  Hep none - Mircera 225mcg q 2 weeks (last 1/5) - Calcitriol  2.73mcg PO q HD   Assessment/Plan: Lower GI bleed: To have colonoscopy today. S/p 1U blood 1/7 and 08/24/24.  Last Hgb 8.8. ESRD: Usual MWF schedule, s/p HD yesterday. Next HD on 08/27/24. HypoTN/volume: BP stable, on mido pre-HD. Some edema, UF as tolerated. Anemia of ESRD: See #1. Has still been getting ESA, recently dosed as outpatient.  Secondary HPTH: Ca/Phos ok - On sensipar .  Resume binders once on regular diet - on renvela  4 AC TID.  Bladder/prostate cancer: For TURBT 1/15 R hand Fx: In cast Nutrition - Renal diet w/fluid restrictions once advanced.   Manuelita Labella, PA-C [facility] 08/25/2024,11:40 AM  LOS: 3 days    "

## 2024-08-25 NOTE — Plan of Care (Signed)

## 2024-08-25 NOTE — Transfer of Care (Signed)
 Immediate Anesthesia Transfer of Care Note  Patient: Devin Bullock  Procedure(s) Performed: COLONOSCOPY BIOPSY,GI POLYPECTOMY, INTESTINE  Patient Location: PACU  Anesthesia Type:MAC  Level of Consciousness: sedated, drowsy, and patient cooperative  Airway & Oxygen Therapy: Patient Spontanous Breathing and Patient connected to nasal cannula oxygen  Post-op Assessment: Report given to RN and Post -op Vital signs reviewed and stable  Post vital signs: Reviewed and stable  Last Vitals:  Vitals Value Taken Time  BP 93/42 08/25/24 11:03  Temp    Pulse 74 08/25/24 11:06  Resp 19 08/25/24 11:06  SpO2 97 % 08/25/24 11:06  Vitals shown include unfiled device data.  Last Pain:  Vitals:   08/25/24 1003  TempSrc: Temporal  PainSc: 0-No pain         Complications: No notable events documented.

## 2024-08-25 NOTE — Op Note (Signed)
 [facility] Patient Name: Devin Bullock Procedure Date : 08/25/2024 MRN: [id number] Attending MD: Glendia BRAVO. Stacia , MD, [id number] Date of Birth: [birth year] CSN: [id number] Age: 59 Admit Type: Inpatient Procedure:                Colonoscopy Indications:              Rectal bleeding Providers:                Glendia E. Stacia, MD, Collene Edu, RN, Fairy Marina, Technician Referring MD:              Medicines:                Monitored Anesthesia Care Complications:            No immediate complications. Estimated Blood Loss:     Estimated blood loss was minimal. Procedure:                Pre-Anesthesia Assessment:                           - Prior to the procedure, a History and Physical                            was performed, and patient medications and                            allergies were reviewed. The patient's tolerance of                            previous anesthesia was also reviewed. The risks                            and benefits of the procedure and the sedation                            options and risks were discussed with the patient.                            All questions were answered, and informed consent                            was obtained. Prior Anticoagulants: The patient has                            taken no anticoagulant or antiplatelet agents. ASA                            Grade Assessment: III - A patient with severe                            systemic disease. After reviewing the risks and  benefits, the patient was deemed in satisfactory                            condition to undergo the procedure.                           After obtaining informed consent, the colonoscope                            was passed under direct vision. Throughout the                            procedure, the patient's blood pressure, pulse, and                            oxygen saturations  were monitored continuously. The                            CF-HQ190L ([id number] Olympus colonoscope was                            introduced through the anus and advanced to the the                            cecum, identified by appendiceal orifice and                            ileocecal valve. The colonoscopy was somewhat                            difficult due to significant looping. Successful                            completion of the procedure was aided by using                            manual pressure. The patient tolerated the                            procedure well. The quality of the bowel                            preparation was fair. The ileocecal valve,                            appendiceal orifice, and rectum were photographed.                            The bowel preparation used was GoLYTELY  via split                            dose instruction. Scope In: 10:16:39 AM Scope Out: 10:57:13 AM Scope Withdrawal Time: 0 hours 32 minutes 36 seconds  Total Procedure Duration: 0 hours 40 minutes 34 seconds  Findings:      The perianal and digital rectal examinations were normal. Pertinent       negatives include normal sphincter tone and no palpable rectal lesions.      A 45 mm polyp was found in the cecum. Although it was difficult to       adequately visualize, it was thoughout to be sessile. It involved most       of the lumen of the cecum opposite the valve side. Biopsies were taken       with a cold forceps for histology. Estimated blood loss was minimal.      A 4 mm polyp was found in the appendiceal orifice. The polyp was       sessile. This polyp was not removed due to its involvement with the       appendiceal orifice and likely need for either surgery or EMR of other       large cecal polyp.      A 4 mm polyp was found in the ascending colon. The polyp was sessile.       The polyp was removed with a cold snare. Resection and retrieval were       complete.  Estimated blood loss was minimal.      Two pedunculated polyps were found in the transverse colon. The polyps       were 6 to 9 mm in size. These polyps were removed with a hot snare.       Resection and retrieval were complete. Estimated blood loss: none.      A 4 mm polyp was found in the transverse colon. The polyp was sessile.       The polyp was removed with a cold snare. Resection and retrieval were       complete. Estimated blood loss was minimal.      Two sessile polyps were found in the descending colon. The polyps were 4       to 5 mm in size. These polyps were removed with a cold snare. Resection       and retrieval were complete. Estimated blood loss was minimal.      An 8 mm polyp was found in the sigmoid colon. The polyp was       pedunculated. The polyp was removed with a hot snare. Resection and       retrieval were complete. Estimated blood loss: none.      There was a large lipoma, 30 mm in diameter, in the sigmoid colon.      A localized area of moderately altered vascular mucosa characterized by       pale mucosa with associated vascular ectasias/neovascularization was       found in the distal rectum.      Multiple medium-mouthed and small-mouthed diverticula were found in the       sigmoid colon, descending colon, transverse colon and cecum.      The exam was otherwise normal throughout the examined colon.      Non-bleeding internal hemorrhoids were found during retroflexion. The       hemorrhoids were Grade I (internal hemorrhoids that do not prolapse).      No additional abnormalities were found on retroflexion. Impression:               - Preparation of the colon was fair.                           -  One 45 mm polyp in the cecum. Biopsied.                           - One 4 mm polyp at the appendiceal orifice.                           - One 4 mm polyp in the ascending colon, removed                            with a cold snare. Resected and retrieved.                            - Two 6 to 9 mm polyps in the transverse colon,                            removed with a hot snare. Resected and retrieved.                           - One 4 mm polyp in the transverse colon, removed                            with a cold snare. Resected and retrieved.                           - Two 4 to 5 mm polyps in the descending colon,                            removed with a cold snare. Resected and retrieved.                           - One 8 mm polyp in the sigmoid colon, removed with                            a hot snare. Resected and retrieved.                           - Large lipoma in the sigmoid colon.                           - Altered vascular mucosa in the distal rectum,                            consistent with radiation proctopathy. This is the                            source of the patient's hematochezia.                           - Moderate diverticulosis in the sigmoid colon, in                            the descending colon, in the transverse colon and  in the cecum.                           - Non-bleeding internal hemorrhoids. Moderate Sedation:      N/A Recommendation:           - Return patient to hospital ward for possible                            discharge same day.                           - Resume regular diet.                           - Continue present medications.                           - Await pathology results.                           - As bleeding as resolved, would not recommend                            treatment for radiation proctopathy at this time.                            If bleeding recurs, recommend treatment with                            carafate enemas (2 grams in 50 mL water  BID).                           - The large cecal polyp will need to be removed                            either with EMR by a therapeutic endoscopist or                            surgically. Will await  biopsy results.                           - Recommend avoiding constipation with daily fiber                            supplement or daily fiber supplement. Procedure Code(s):        --- Professional ---                           [phone], Colonoscopy, flexible; with removal of                            tumor(s), polyp(s), or other lesion(s) by snare                            technique  54619, 59, Colonoscopy, flexible; with biopsy,                            single or multiple Diagnosis Code(s):        --- Professional ---                           K64.0, First degree hemorrhoids                           D12.0, Benign neoplasm of cecum                           D12.2, Benign neoplasm of ascending colon                           D12.3, Benign neoplasm of transverse colon (hepatic                            flexure or splenic flexure)                           D12.5, Benign neoplasm of sigmoid colon                           D12.4, Benign neoplasm of descending colon                           D17.5, Benign lipomatous neoplasm of                            intra-abdominal organs                           K62.89, Other specified diseases of anus and rectum                           K62.5, Hemorrhage of anus and rectum                           K57.30, Diverticulosis of large intestine without                            perforation or abscess without bleeding CPT copyright 2022 [employer]. All rights reserved. The codes documented in this report are preliminary and upon coder review may  be revised to meet current compliance requirements. Bennette Hasty E. Stacia, MD 08/25/2024 11:25:07 AM This report has been signed electronically. Number of Addenda: 0

## 2024-08-26 ENCOUNTER — Encounter (HOSPITAL_COMMUNITY): Payer: Self-pay | Admitting: Gastroenterology

## 2024-08-26 DIAGNOSIS — D62 Acute posthemorrhagic anemia: Secondary | ICD-10-CM | POA: Diagnosis not present

## 2024-08-26 LAB — CBC
HCT: 25.9 % — ABNORMAL LOW (ref 39.0–52.0)
Hemoglobin: 8.1 g/dL — ABNORMAL LOW (ref 13.0–17.0)
MCH: 28.9 pg (ref 26.0–34.0)
MCHC: 31.3 g/dL (ref 30.0–36.0)
MCV: 92.5 fL (ref 80.0–100.0)
Platelets: 204 K/uL (ref 150–400)
RBC: 2.8 MIL/uL — ABNORMAL LOW (ref 4.22–5.81)
RDW: 18.6 % — ABNORMAL HIGH (ref 11.5–15.5)
WBC: 5 K/uL (ref 4.0–10.5)
nRBC: 0 % (ref 0.0–0.2)

## 2024-08-26 LAB — PREPARE RBC (CROSSMATCH)

## 2024-08-26 MED ORDER — ACETAMINOPHEN 325 MG PO TABS: 650.0000 mg | ORAL_TABLET | Freq: Four times a day (QID) | ORAL | Status: AC | PRN

## 2024-08-26 MED ORDER — CHLORHEXIDINE GLUCONATE CLOTH 2 % EX PADS: 6.0000 | MEDICATED_PAD | Freq: Every day | CUTANEOUS | Status: DC

## 2024-08-26 MED ORDER — SODIUM CHLORIDE 0.9% IV SOLUTION: Freq: Once | INTRAVENOUS | Status: DC

## 2024-08-26 NOTE — Progress Notes (Signed)
 " [facility] Progress Note   Subjective:   Patient seen and examined at bedside. Reports he has not had a BM today. Hoping for BM after breakfast. Had not noticed any bleeding.  Denies CP, SOB, abdominal pain, nausea, vomiting and diarrhea. Admits to ongoing hematuria.  Objective Vitals:   08/25/24 1626 08/25/24 1917 08/26/24 0300 08/26/24 0758  BP: (!) 103/59 120/72  123/79  Pulse: 79 81 81 81  Resp:      Temp: 97.7 F (36.5 C) 98.2 F (36.8 C)  98.2 F (36.8 C)  TempSrc: Oral Oral  Oral  SpO2: 98% 98% 95% 96%  Weight:      Height:       Physical Exam General:well appearing male in NAD Heart:RRR, no mrg Lungs:CTAB, nml WOB on RA Abdomen:soft, NTND Extremities:no LE edema Dialysis Access: LU AVF   Filed Weights   08/23/24 1526 08/24/24 0956 08/24/24 1405  Weight: (!) 137.8 kg (!) 137.8 kg (!) 140.1 kg    Intake/Output Summary (Last 24 hours) at 08/26/2024 1050 Last data filed at 08/25/2024 2145 Gross per 24 hour  Intake 3 ml  Output --  Net 3 ml    Additional Objective Labs: Basic Metabolic Panel: Recent Labs  Lab 08/23/24 0340 08/24/24 0330 08/25/24 0459  NA 141 137 139  K 4.2 4.1 3.8  CL 96* 94* 93*  CO2 32 33* 31  GLUCOSE 92 80 88  BUN 59* 33* 22*  CREATININE 13.30* 8.67* 7.12*  CALCIUM  9.4 9.4 10.0  PHOS 5.9* 4.8* 3.6   Liver Function Tests: Recent Labs  Lab 08/21/24 1745 08/23/24 0340 08/24/24 0330 08/25/24 0459  AST 29  --   --   --   ALT 13  --   --   --   ALKPHOS 142*  --   --   --   BILITOT 0.3  --   --   --   PROT 7.0  --   --   --   ALBUMIN  3.9 3.5 3.5 3.8   CBC: Recent Labs  Lab 08/21/24 1745 08/22/24 1605 08/22/24 2331 08/23/24 0340 08/23/24 1122 08/24/24 0330 08/25/24 0459 08/26/24 0422  WBC 5.7 4.6  --  5.3  --   --  5.1 5.0  NEUTROABS 3.6 3.2  --   --   --   --   --   --   HGB 7.6* 6.5*   < > 7.2*   < > 7.4* 8.8* 8.1*  HCT 25.0* 21.7*   < > 23.3*   < > 23.7* 27.5* 25.9*  MCV 96.9 96.0  --  95.9  --    --  90.8 92.5  PLT 174 165  --  155  --   --  214 204   < > = values in this interval not displayed.   Medications:   sodium chloride    Intravenous Once   atorvastatin   40 mg Oral Daily   Chlorhexidine  Gluconate Cloth  6 each Topical Q0600   cinacalcet   60 mg Oral Q breakfast   hydrocortisone   1 Application Rectal BID   midodrine   5 mg Oral Q M,W,F-HD   Na Sulfate-K Sulfate-Mg Sulfate concentrate  0.5 kit Oral Once   sevelamer  carbonate  2,400 mg Oral TID WC   simethicone   240 mg Oral Once   Followed by   simethicone   240 mg Oral Once   sodium chloride  flush  3 mL Intravenous Q12H    Dialysis Orders: [facility] MWF 4.5h  B450   135kg   2K bath  AVF  Hep none - Mircera 225mcg q 2 weeks (last 1/5) - Calcitriol  2.60mcg PO q HD  As bleeding as resolved, would not recommend treatment for radiation proctopathy at this time. If bleeding recurs, recommend treatment with carafate enemas ( 2 grams in 50 mL water  BID) . - The large cecal polyp will need to be removed either with EMR by a therapeutic endoscopist or surgically. Will await biopsy results.   Assessment/Plan: Lower GI bleed:  S/p 1U blood 1/7 and 08/24/24.  Colonoscopy yesterday with multiple polyps removed, one 45mm polyp will require removal either with EMR by a therapeutic endoscopist or surgically.  Radiation proctopathy noted as cause for bleeding. No longer bleeding.  If bleeding recurs, GI recommend treatment with carafate enemas ( 2 grams in 50 mL water  BID). Avoid constipation.  ESRD: Usual MWF schedule.[facility] Next HD on 08/27/24. HypoTN/volume: BP stable, on mido pre-HD. Does not appear volume overloaded. UF as tolerated. Anemia of ESRD: See #1. Has still been getting ESA, recently dosed as outpatient. Next due 09/03/24. Secondary HPTH: Ca/Phos ok - On sensipar .  Continue binders- renvela  4 AC TID.  Bladder/prostate cancer: Scheduled for TURBT 1/15 R hand Fx: In cast Nutrition - Currently on regular diet. Monitor labs, if K increased  change to renal diet w/fluid restrictions once advanced.  Manuelita Labella, PA-C [facility] 08/26/2024,10:50 AM  LOS: 4 days    "

## 2024-08-26 NOTE — Progress Notes (Signed)
 Patient stated he is refusing vitals for the remainder of the night

## 2024-08-26 NOTE — Plan of Care (Signed)

## 2024-08-26 NOTE — Discharge Summary (Signed)
 Physician Discharge Summary  Devin Bullock FMW:[id number] DOB: Nov 28, 1965 DOA: 08/21/2024  PCP: [facility]  Admit date: 08/21/2024 Discharge date: 08/26/2024    Recommendations for Outpatient Follow-up:  Follow up with PCP in 1-2 weeks Please obtain BMP/CBC in one week Patient has an appointment for TURP on 08/31/2023.  To keep that appointment. He is follow on the final results of his apices obtained during colonoscopy from polyps , GI will follow with him as an outpatient as likely will need further polyp resection as an outpatient.   Diet recommendation: Renal diet  Brief/Interim Summary:  Devin Bullock is a 59 y.o. male with medical history significant of ESRD on HD MWF, prostate and bladder cancer s/p TURP 2022, anemia of chronic disease, paroxysmal SVT, history of spontaneous right perinephric hematoma in June 2025, who presents with presents with blood in his stools as well as ongoing hematuria.   Patient recently placed on naproxen  for fracture with subsequent episodes of bloody stool and clots passed via rectum, his workup significant for anemia, he is known to have history of hematuria, with known prostate and bladder cancer that is postradiation at [facility],. Of note patient is scheduled for Transurethral Resection of a Bladder Tumor (TURBT) with possible instillation of intravesical chemotherapy on January 15th at [facility].  - His workup significant for anemia due to lower GI bleed, went for colonoscopy, please see discussion below.     Acute on chronic blood loss anemia secondary to lower GI bleed/radiation proctatis. -Reports hematochezia, due  lower GI bleed. - Anoscopy has finally been performed after multiple attempts as patient had some noncompliance with diet initially.   - Colonoscopy 08/25/2024  significant for radiation proctitis, but appears bleeding has stabilized, was significant for multiple polyps s/p resection, with presence of large 45 mm  polyp in the cecum which was biopsied, per GI this will need to be removed either surgically or by therapeutic endoscopist as an outpatient, but biopsies are pending for now. . - He received 4 units during hospital stay, there is no bowel movements overnight, no further evidence of bleeding, hemoglobin is stable at 8.1 today, but given the fact he will have third surgery 08/30/2024, with some ongoing hematuria will transfuse another unit prior to discharge so be optimized for that surgery.    Bladder and prostate cancer Acute recurrent hematuria - Currently following at [facility]  - Plan for TURBT 1/15 Please see above discussion   Right hand fracture, POA Currently in a cast Discontinue NSAIDs given above   ESRD on HD MWF Chronic hypotension -Currently volume overloaded, management per renal, went for HD yesterday, next session 1/12.  Due for dialysis this morning, likely to undergo HD later today per nephrology -appreciate insight recommendations - Continue midodrine  with dialysis     Heart failure with preserved EF -Volume management with dialysis as above Prior echo 2024 -EF to be 50 to 55% with indeterminate diastolic parameter   Obesity, class II BMI 36.76 kg/m    Discharge Diagnoses:  Active Problems:   Acute blood loss anemia   GI bleed   Gross hematuria   History of bladder cancer   Right hand fracture   ESRD (end stage renal disease) on dialysis (HCC)   Heart failure with preserved ejection fraction (HCC)   Obesity   Lower GI bleed   Radiation proctitis   Colonic mass   Benign neoplasm of transverse colon    Discharge Instructions  Discharge Instructions  Increase activity slowly   Complete by: As directed       Allergies as of 08/26/2024       Reactions   Nsaids Other (See Comments)   Patient has HX of GI bleed, and medications aggravate condition.         Medication List     TAKE these medications    acetaminophen  325 MG  tablet Commonly known as: TYLENOL  Take 2 tablets (650 mg total) by mouth every 6 (six) hours as needed for mild pain (pain score 1-3) or fever (or Fever >/= 101).   atorvastatin  40 MG tablet Commonly known as: LIPITOR Take 1 tablet (40 mg total) by mouth daily.   cinacalcet  60 MG tablet Commonly known as: SENSIPAR  Take 60 mg by mouth daily.   metoprolol  tartrate 25 MG tablet Commonly known as: LOPRESSOR  Take 12.5 mg by mouth in the morning.   midodrine  10 MG tablet Commonly known as: PROAMATINE  Take 10 mg by mouth every Monday, Wednesday, and Friday. Take prior to going to dialysis.  If BP is high, do not take medication.   sevelamer  carbonate 800 MG tablet Commonly known as: RENVELA  Take 2,400 mg by mouth 3 (three) times daily with meals. Take 1 tablet by mouth a snack        Allergies[1]  Consultations: GI Renal   Procedures/Studies: DG Elbow Complete Right Result Date: 08/16/2024 EXAM: 3 VIEW(S) XRAY OF THE ELBOW COMPARISON: None available. CLINICAL HISTORY: fall, pain FINDINGS: BONES AND JOINTS: No acute fracture. No malalignment. SOFT TISSUES: The soft tissues are unremarkable. IMPRESSION: 1. No acute fracture or dislocation. Electronically signed by: Morgane Naveau MD 08/16/2024 02:52 PM EST RP Workstation: [id number]   DG Hand Complete Right Result Date: 08/16/2024 EXAM: 3 OR MORE VIEW(S) XRAY OF THE HAND 08/16/2024 02:41:00 PM COMPARISON: None available. CLINICAL HISTORY: fall, pain FINDINGS: BONES AND JOINTS: Mildly displaced fracture of the base of the fifth metacarpal. Possible associated avulsion fracture along the lateral aspect of the fifth metacarpal base. SOFT TISSUES: Diffuse soft tissue edema of the hand. Atherosclerotic vascular calcifications. IMPRESSION: 1. Mildly displaced fracture of the base of the fifth metacarpal with possible associated avulsion fracture along the lateral aspect of the fifth metacarpal base. 2. Diffuse soft tissue edema of the hand.  Electronically signed by: Morgane Naveau MD 08/16/2024 02:52 PM EST RP Workstation: [id number]   DG Knee Complete 4 Views Right Result Date: 08/16/2024 EXAM: 4 OR MORE VIEW(S) XRAY OF THE KNEE 08/16/2024 02:41:00 PM COMPARISON: None available. CLINICAL HISTORY: fall wrist pain FINDINGS: BONES AND JOINTS: No acute fracture. No malalignment. No significant joint effusion. SOFT TISSUES: Vascular calcifications are present in the soft tissues. IMPRESSION: 1. No acute osseous abnormality. Electronically signed by: Morgane Naveau MD 08/16/2024 02:51 PM EST RP Workstation: [id number]   DG Wrist Complete Right Result Date: 08/16/2024 EXAM: 3 OR MORE VIEW(S) XRAY OF THE WRIST 08/16/2024 02:41:00 PM COMPARISON: None available. CLINICAL HISTORY: fall wrist pain FINDINGS: BONES AND JOINTS: Possible nondisplaced fracture at the base of the fifth metacarpal. No malalignment. SOFT TISSUES: Soft tissue edema of the hand. IMPRESSION: 1. Possible nondisplaced fracture at the base of the fifth metacarpal. Please see separately dictated x-ray right hand 08/16/24. 2. Soft tissue edema of the hand. Electronically signed by: Morgane Naveau MD 08/16/2024 02:51 PM EST RP Workstation: [id number]      Subjective: No BMs overnight, denies any complaints today.  Discharge Exam: Vitals:   08/26/24 0758 08/26/24 1206  BP: 123/79 (!) 104/52  Pulse:  81 80  Resp:    Temp: 98.2 F (36.8 C) 98.4 F (36.9 C)  SpO2: 96% 97%   Vitals:   08/25/24 1917 08/26/24 0300 08/26/24 0758 08/26/24 1206  BP: 120/72  123/79 (!) 104/52  Pulse: 81 81 81 80  Resp:      Temp:   98.2 F (36.8 C) 98.4 F (36.9 C)  TempSrc: Oral  Oral Oral  SpO2: 98% 95% 96% 97%  Weight:      Height:        General: Pt is alert, awake, not in acute distress Cardiovascular: RRR, S1/S2 +, no rubs, no gallops Respiratory: CTA bilaterally, no wheezing, no rhonchi Abdominal: Soft, NT, ND, bowel sounds + Extremities: no edema, no cyanosis    The results  of significant diagnostics from this hospitalization (including imaging, microbiology, ancillary and laboratory) are listed below for reference.     Microbiology: No results found for this or any previous visit (from the past 240 hours).   Labs: BNP (last 3 results) Recent Labs    05/13/24 1849  BNP 62.5   Basic Metabolic Panel: Recent Labs  Lab 08/21/24 1745 08/23/24 0340 08/24/24 0330 08/25/24 0459  NA 141 141 137 139  K 3.7 4.2 4.1 3.8  CL 95* 96* 94* 93*  CO2 33* 32 33* 31  GLUCOSE 138* 92 80 88  BUN 48* 59* 33* 22*  CREATININE 10.50* 13.30* 8.67* 7.12*  CALCIUM  9.7 9.4 9.4 10.0  PHOS  --  5.9* 4.8* 3.6   Liver Function Tests: Recent Labs  Lab 08/21/24 1745 08/23/24 0340 08/24/24 0330 08/25/24 0459  AST 29  --   --   --   ALT 13  --   --   --   ALKPHOS 142*  --   --   --   BILITOT 0.3  --   --   --   PROT 7.0  --   --   --   ALBUMIN  3.9 3.5 3.5 3.8   No results for input(s): LIPASE, AMYLASE in the last 168 hours. No results for input(s): AMMONIA in the last 168 hours. CBC: Recent Labs  Lab 08/21/24 1745 08/22/24 1605 08/22/24 2331 08/23/24 0340 08/23/24 1122 08/23/24 1849 08/23/24 2110 08/24/24 0330 08/25/24 0459 08/26/24 0422  WBC 5.7 4.6  --  5.3  --   --   --   --  5.1 5.0  NEUTROABS 3.6 3.2  --   --   --   --   --   --   --   --   HGB 7.6* 6.5*   < > 7.2*   < > 7.5* 7.9* 7.4* 8.8* 8.1*  HCT 25.0* 21.7*   < > 23.3*   < > 23.9* 25.5* 23.7* 27.5* 25.9*  MCV 96.9 96.0  --  95.9  --   --   --   --  90.8 92.5  PLT 174 165  --  155  --   --   --   --  214 204   < > = values in this interval not displayed.   Cardiac Enzymes: No results for input(s): CKTOTAL, CKMB, CKMBINDEX, TROPONINI in the last 168 hours. BNP: Invalid input(s): POCBNP CBG: No results for input(s): GLUCAP in the last 168 hours. D-Dimer No results for input(s): DDIMER in the last 72 hours. Hgb A1c No results for input(s): HGBA1C in the last 72  hours. Lipid Profile No results for input(s): CHOL, HDL, LDLCALC, TRIG, CHOLHDL, LDLDIRECT in  the last 72 hours. Thyroid  function studies No results for input(s): TSH, T4TOTAL, T3FREE, THYROIDAB in the last 72 hours.  Invalid input(s): FREET3 Anemia work up No results for input(s): VITAMINB12, FOLATE, FERRITIN, TIBC, IRON, RETICCTPCT in the last 72 hours. Urinalysis    Component Value Date/Time   COLORURINE RED (A) 01/16/2024 2249   APPEARANCEUR TURBID (A) 01/16/2024 2249   LABSPEC  01/16/2024 2249    TEST NOT REPORTED DUE TO COLOR INTERFERENCE OF URINE PIGMENT   PHURINE  01/16/2024 2249    TEST NOT REPORTED DUE TO COLOR INTERFERENCE OF URINE PIGMENT   GLUCOSEU (A) 01/16/2024 2249    TEST NOT REPORTED DUE TO COLOR INTERFERENCE OF URINE PIGMENT   HGBUR (A) 01/16/2024 2249    TEST NOT REPORTED DUE TO COLOR INTERFERENCE OF URINE PIGMENT   BILIRUBINUR (A) 01/16/2024 2249    TEST NOT REPORTED DUE TO COLOR INTERFERENCE OF URINE PIGMENT   KETONESUR (A) 01/16/2024 2249    TEST NOT REPORTED DUE TO COLOR INTERFERENCE OF URINE PIGMENT   PROTEINUR (A) 01/16/2024 2249    TEST NOT REPORTED DUE TO COLOR INTERFERENCE OF URINE PIGMENT   UROBILINOGEN 0.2 04/13/2013 0743   NITRITE (A) 01/16/2024 2249    TEST NOT REPORTED DUE TO COLOR INTERFERENCE OF URINE PIGMENT   LEUKOCYTESUR (A) 01/16/2024 2249    TEST NOT REPORTED DUE TO COLOR INTERFERENCE OF URINE PIGMENT   Sepsis Labs Recent Labs  Lab 08/22/24 1605 08/23/24 0340 08/25/24 0459 08/26/24 0422  WBC 4.6 5.3 5.1 5.0   Microbiology No results found for this or any previous visit (from the past 240 hours).   Time coordinating discharge: Over 30 minutes  SIGNED:   Brayton Lye, MD  [facility] 08/26/2024, 12:19 PM Pager   If 7PM-7AM, please contact night-coverage [email or web address] Password [email or web address]    [1]  Allergies Allergen Reactions   Nsaids Other (See Comments)    Patient has HX of GI  bleed, and medications aggravate condition.

## 2024-08-26 NOTE — Discharge Planning (Signed)
 [facility] Patient Discharge Orders- [facility] CLINIC: [place]  Patient's name: Devin Bullock Admit/DC Dates: 08/21/2024 - 08/26/24  Discharge Diagnoses: Lower GIB s/p Colonoscopy yesterday with multiple polyps removed, one 45mm polyp will require removal either with EMR by a therapeutic endoscopist or surgically.  Radiation proctopathy noted as cause for bleeding. No longer bleeding   HD ORDER CHANGES: Heparin  change: no  EDW Change: no Bath Change: no       ANEMIA MANAGEMENT: Aranesp : Given: no    PRBC's Given: yes Date/# of units: 3 units total - 1 on 1/7, 1/9 and 1/11  ESA dose for discharge: No change, continue as scheduled. IV Iron dose at discharge: no   BONE/MINERAL MEDICATIONS: Hectorol/Calcitriol  change: no Sensipar /Parsabiv change: no   ACCESS INTERVENTION/CHANGE: none Details:   RECENT LABS: Recent Labs  Lab 08/25/24 0459  K 3.8  CALCIUM  10.0  ALBUMIN  3.8  PHOS 3.6   Recent Labs  Lab 08/26/24 0422  HGB 8.1*     IV ANTIBIOTICS: no Details:   OTHER ANTICOAGULATION:  On Eliquis: no  On Coumadin: no   OTHER/APPTS/LAB ORDERS:     D/C Meds to be reconciled by nurse after every discharge.  Completed By: Manuelita Labella PA-C   Reviewed by: MD:______ RN_______

## 2024-08-27 LAB — TYPE AND SCREEN
ABO/RH(D): O POS
Antibody Screen: NEGATIVE
Unit division: 0

## 2024-08-27 LAB — BPAM RBC
Blood Product Expiration Date: 202602042359
ISSUE DATE / TIME: 202601111408
Unit Type and Rh: 5100

## 2024-08-27 NOTE — Telephone Encounter (Signed)
 Pt was contacted in regard to previous message. Pt stated that he had his Colonoscopy on Saturday. Chart reviewed and noted that pt had a colonoscopy on 08/25/2024 with Dr. Stacia.

## 2024-08-27 NOTE — Anesthesia Postprocedure Evaluation (Signed)
"   Anesthesia Post Note  Patient: Devin Bullock  Procedure(s) Performed: COLONOSCOPY BIOPSY,GI POLYPECTOMY, INTESTINE     Patient location during evaluation: PACU Anesthesia Type: MAC Level of consciousness: awake and alert Pain management: pain level controlled Vital Signs Assessment: post-procedure vital signs reviewed and stable Respiratory status: spontaneous breathing, nonlabored ventilation and respiratory function stable Cardiovascular status: stable and blood pressure returned to baseline Postop Assessment: no apparent nausea or vomiting Anesthetic complications: no   No notable events documented.                 Jannat Rosemeyer      "

## 2024-08-27 NOTE — Progress Notes (Signed)
 Pt was d/c to home yesterday. Contacted [facility] this morning to be advised of pt's d/c date and that pt should resume care today.   Randine Mungo Dialysis Navigator [phone]

## 2024-08-28 LAB — SURGICAL PATHOLOGY

## 2024-08-29 ENCOUNTER — Ambulatory Visit: Payer: Self-pay | Admitting: Gastroenterology

## 2024-08-29 NOTE — Progress Notes (Signed)
 Devin Bullock,  The very large polyp that was biopsied from your cecum is not cancerous, but is at high risk of turning into cancer if it is not removed.  I asked our therapeutic endoscopist, Dr. Arville Roddy to review your case, and he recommended you be referred for surgical resection of the polyp, given its very large size and high risk of recurrence.  If you are not felt to be a good surgical candidate, then he would reconsider removing the polyp endoscopically.  The 7 other smaller polyps were all precancerous polyps as well, meaning that they would have had potential to turn into cancer had they not been completely removed.  We will place a referral to [facility] to discuss possible surgical resection.    I would recommend a repeat colonoscopy in about 1 year after the large polyp is removed.   Team,  Please place surgery referral to [facility] for consideration of ileocecectomy for large polyp and place tentative reminder for repeat colonoscopy in 1 year.

## 2024-09-04 NOTE — Telephone Encounter (Signed)
 Patient scheduled with Dr Teresa at [facility] for appointment 10/01/24.

## 2024-09-13 ENCOUNTER — Ambulatory Visit: Admitting: Cardiology

## 2024-09-13 NOTE — Progress Notes (Unsigned)
" °  Cardiology Office Note:   Date:  09/13/2024  ID:  Devin Bullock, DOB Jan 28, 1966, MRN [id number] PCP: [facility]  [facility] Providers Cardiologist:  None {  History of Present Illness:   Devin Bullock is a 59 y.o. male  h/o chronic systolic heart failure, dilated cardiomyopathy, SVT, AVNRT, GI bleed, prostate cancer, bladder cancer, ESRD on HD, obesity and anemia who we talked to in April 2025 pending endoscopy.     In 2015.  Lexiscan  Myoview to evaluate symptoms was intermediate risk with reversible apical defect consistent with probable apical thinning.  He was hospitalized in October 2024 in the setting of SVT.  He had an elevated heart rate during dialysis with associated shortness of breath.  He was given adenosine  and IV metoprolol  with improvement in his heart rate. Cardiology was consulted.  He was started on metoprolol  tartrate 12.5 mg twice daily.  Echocardiogram showed normal EF (prior EF 45-50%). TSH was normal.  Troponin was elevated, likely secondary to demand ischemia in the setting of tachycardia. He was not felt to be a candidate for invasive angiography. It was noted that outpatient coronary CTA could be considered.   ROS: ***  Studies Reviewed:    EKG:       ***  Risk Assessment/Calculations:   {Does this patient have ATRIAL FIBRILLATION?[phone]} No BP recorded.  {Refresh Note OR Click here to enter BP  :1}***        Physical Exam:   VS:  There were no vitals taken for this visit.   Wt Readings from Last 3 Encounters:  08/24/24 (!) 308 lb 13.8 oz (140.1 kg)  08/16/24 297 lb 13.5 oz (135.1 kg)  08/14/24 297 lb 12.8 oz (135.1 kg)     GEN: Well nourished, well developed in no acute distress NECK: No JVD; No carotid bruits CARDIAC: ***RR, *** murmurs, rubs, gallops RESPIRATORY:  Clear to auscultation without rales, wheezing or rhonchi  ABDOMEN: Soft, non-tender, non-distended EXTREMITIES:  No edema; No deformity   ASSESSMENT AND  PLAN:   SVT/AVNRT:  ***  Occurred during dialysis, improved with adenosine  and metoprolol .  Echo, labs were unremarkable.  He is tolerating low-dose metoprolol , denies any palpitations.  Continue metoprolol .    Hypotension: ***  BP remains stable on low-dose metoprolol .  Continue to monitor.   Chronic systolic heart failure with improved EF: Prior EF 45 to 50%, most recent echo showed normal EF.  ***  Euvolemic and well compensated on exam.  Fluid volume management per HD.   Elevated troponin:   ***  Likely occurred in the setting of demand ischemia in the setting of tachycardia.  Outpatient ischemic evaluation was mentioned.  Patient is asymptomatic.  Discussed with Dr. Lavona, no indication for ischemic evaluation at this time.  Should he develop symptoms, could consider cardiac PET stress test.   Anemia/GI bleed/hematuria:  ***  Following with GI/urology.  Pending colonoscopy as below.   ESRD on HD:  *** Following with nephrology       Follow up ***  Signed, Lynwood Lavona, MD   "

## 2024-10-02 ENCOUNTER — Ambulatory Visit: Admitting: Gastroenterology

## 2024-10-11 ENCOUNTER — Ambulatory Visit: Admitting: Cardiology
